# Patient Record
Sex: Female | Born: 1935 | Race: White | Hispanic: No | Smoking: Never smoker
Health system: Southern US, Community
[De-identification: ages and names within clinical notes are randomized; demographics above are authoritative.]

## PROBLEM LIST (undated history)

## (undated) DIAGNOSIS — R269 Unspecified abnormalities of gait and mobility: Secondary | ICD-10-CM

## (undated) DIAGNOSIS — I10 Essential (primary) hypertension: Secondary | ICD-10-CM

## (undated) DIAGNOSIS — G63 Polyneuropathy in diseases classified elsewhere: Secondary | ICD-10-CM

## (undated) DIAGNOSIS — G709 Myoneural disorder, unspecified: Secondary | ICD-10-CM

## (undated) DIAGNOSIS — Z8719 Personal history of other diseases of the digestive system: Secondary | ICD-10-CM

## (undated) DIAGNOSIS — G4762 Sleep related leg cramps: Secondary | ICD-10-CM

## (undated) DIAGNOSIS — D126 Benign neoplasm of colon, unspecified: Secondary | ICD-10-CM

## (undated) DIAGNOSIS — Z8619 Personal history of other infectious and parasitic diseases: Secondary | ICD-10-CM

## (undated) DIAGNOSIS — K219 Gastro-esophageal reflux disease without esophagitis: Secondary | ICD-10-CM

## (undated) DIAGNOSIS — E785 Hyperlipidemia, unspecified: Secondary | ICD-10-CM

## (undated) DIAGNOSIS — R011 Cardiac murmur, unspecified: Secondary | ICD-10-CM

## (undated) DIAGNOSIS — M549 Dorsalgia, unspecified: Secondary | ICD-10-CM

## (undated) DIAGNOSIS — F329 Major depressive disorder, single episode, unspecified: Secondary | ICD-10-CM

## (undated) DIAGNOSIS — I639 Cerebral infarction, unspecified: Secondary | ICD-10-CM

## (undated) DIAGNOSIS — I340 Nonrheumatic mitral (valve) insufficiency: Secondary | ICD-10-CM

## (undated) DIAGNOSIS — M179 Osteoarthritis of knee, unspecified: Secondary | ICD-10-CM

## (undated) DIAGNOSIS — I4891 Unspecified atrial fibrillation: Secondary | ICD-10-CM

## (undated) DIAGNOSIS — K5790 Diverticulosis of intestine, part unspecified, without perforation or abscess without bleeding: Secondary | ICD-10-CM

## (undated) DIAGNOSIS — C801 Malignant (primary) neoplasm, unspecified: Secondary | ICD-10-CM

## (undated) DIAGNOSIS — B029 Zoster without complications: Secondary | ICD-10-CM

## (undated) DIAGNOSIS — R413 Other amnesia: Secondary | ICD-10-CM

## (undated) DIAGNOSIS — M1710 Unilateral primary osteoarthritis, unspecified knee: Secondary | ICD-10-CM

## (undated) DIAGNOSIS — F32A Depression, unspecified: Secondary | ICD-10-CM

## (undated) DIAGNOSIS — G629 Polyneuropathy, unspecified: Secondary | ICD-10-CM

## (undated) DIAGNOSIS — M1990 Unspecified osteoarthritis, unspecified site: Secondary | ICD-10-CM

## (undated) HISTORY — DX: Polyneuropathy, unspecified: G62.9

## (undated) HISTORY — PX: APPENDECTOMY: SHX54

## (undated) HISTORY — DX: Polyneuropathy in diseases classified elsewhere: G63

## (undated) HISTORY — DX: Unspecified atrial fibrillation: I48.91

## (undated) HISTORY — DX: Other amnesia: R41.3

## (undated) HISTORY — DX: Osteoarthritis of knee, unspecified: M17.9

## (undated) HISTORY — PX: COLON SURGERY: SHX602

## (undated) HISTORY — DX: Zoster without complications: B02.9

## (undated) HISTORY — DX: Unilateral primary osteoarthritis, unspecified knee: M17.10

## (undated) HISTORY — DX: Cerebral infarction, unspecified: I63.9

## (undated) HISTORY — DX: Gastro-esophageal reflux disease without esophagitis: K21.9

## (undated) HISTORY — PX: TONSILLECTOMY: SUR1361

## (undated) HISTORY — DX: Personal history of other infectious and parasitic diseases: Z86.19

## (undated) HISTORY — DX: Hyperlipidemia, unspecified: E78.5

## (undated) HISTORY — DX: Depression, unspecified: F32.A

## (undated) HISTORY — DX: Benign neoplasm of colon, unspecified: D12.6

## (undated) HISTORY — PX: TUBAL LIGATION: SHX77

## (undated) HISTORY — PX: ABDOMINAL HYSTERECTOMY: SHX81

## (undated) HISTORY — PX: JOINT REPLACEMENT: SHX530

## (undated) HISTORY — PX: EYE SURGERY: SHX253

## (undated) HISTORY — DX: Nonrheumatic mitral (valve) insufficiency: I34.0

## (undated) HISTORY — PX: WRIST SURGERY: SHX841

## (undated) HISTORY — DX: Dorsalgia, unspecified: M54.9

## (undated) HISTORY — DX: Unspecified abnormalities of gait and mobility: R26.9

## (undated) HISTORY — DX: Major depressive disorder, single episode, unspecified: F32.9

## (undated) HISTORY — DX: Sleep related leg cramps: G47.62

## (undated) HISTORY — DX: Diverticulosis of intestine, part unspecified, without perforation or abscess without bleeding: K57.90

---

## 1997-08-31 ENCOUNTER — Other Ambulatory Visit: Admission: RE | Admit: 1997-08-31 | Discharge: 1997-08-31 | Payer: Self-pay | Admitting: Obstetrics and Gynecology

## 1998-09-30 ENCOUNTER — Other Ambulatory Visit: Admission: RE | Admit: 1998-09-30 | Discharge: 1998-09-30 | Payer: Self-pay | Admitting: Obstetrics and Gynecology

## 1999-05-10 ENCOUNTER — Encounter (INDEPENDENT_AMBULATORY_CARE_PROVIDER_SITE_OTHER): Payer: Self-pay | Admitting: Specialist

## 1999-05-10 ENCOUNTER — Ambulatory Visit (HOSPITAL_COMMUNITY): Admission: RE | Admit: 1999-05-10 | Discharge: 1999-05-10 | Payer: Self-pay | Admitting: Internal Medicine

## 1999-05-10 ENCOUNTER — Encounter: Payer: Self-pay | Admitting: Internal Medicine

## 1999-10-03 ENCOUNTER — Encounter (INDEPENDENT_AMBULATORY_CARE_PROVIDER_SITE_OTHER): Payer: Self-pay | Admitting: Specialist

## 1999-10-03 ENCOUNTER — Encounter: Payer: Self-pay | Admitting: Surgery

## 1999-10-03 ENCOUNTER — Inpatient Hospital Stay (HOSPITAL_COMMUNITY): Admission: EM | Admit: 1999-10-03 | Discharge: 1999-10-10 | Payer: Self-pay | Admitting: Surgery

## 1999-10-04 ENCOUNTER — Encounter: Payer: Self-pay | Admitting: Surgery

## 1999-10-09 ENCOUNTER — Encounter: Payer: Self-pay | Admitting: General Surgery

## 1999-11-06 ENCOUNTER — Encounter: Payer: Self-pay | Admitting: Surgery

## 1999-11-06 ENCOUNTER — Ambulatory Visit (HOSPITAL_COMMUNITY): Admission: RE | Admit: 1999-11-06 | Discharge: 1999-11-06 | Payer: Self-pay | Admitting: Surgery

## 1999-11-29 ENCOUNTER — Ambulatory Visit (HOSPITAL_COMMUNITY): Admission: RE | Admit: 1999-11-29 | Discharge: 1999-11-29 | Payer: Self-pay | Admitting: Surgery

## 1999-11-29 ENCOUNTER — Encounter: Payer: Self-pay | Admitting: Surgery

## 1999-12-12 ENCOUNTER — Other Ambulatory Visit: Admission: RE | Admit: 1999-12-12 | Discharge: 1999-12-12 | Payer: Self-pay | Admitting: Obstetrics and Gynecology

## 2000-08-13 ENCOUNTER — Encounter: Payer: Self-pay | Admitting: Orthopedic Surgery

## 2000-08-15 ENCOUNTER — Inpatient Hospital Stay (HOSPITAL_COMMUNITY): Admission: RE | Admit: 2000-08-15 | Discharge: 2000-08-19 | Payer: Self-pay | Admitting: Orthopedic Surgery

## 2000-08-15 ENCOUNTER — Encounter: Payer: Self-pay | Admitting: Orthopedic Surgery

## 2000-12-11 ENCOUNTER — Other Ambulatory Visit: Admission: RE | Admit: 2000-12-11 | Discharge: 2000-12-11 | Payer: Self-pay | Admitting: Obstetrics and Gynecology

## 2004-12-14 ENCOUNTER — Ambulatory Visit: Payer: Self-pay | Admitting: Internal Medicine

## 2005-01-02 ENCOUNTER — Ambulatory Visit: Payer: Self-pay | Admitting: Internal Medicine

## 2005-01-02 ENCOUNTER — Encounter (INDEPENDENT_AMBULATORY_CARE_PROVIDER_SITE_OTHER): Payer: Self-pay | Admitting: Specialist

## 2005-07-11 ENCOUNTER — Encounter: Admission: RE | Admit: 2005-07-11 | Discharge: 2005-07-11 | Payer: Self-pay | Admitting: Internal Medicine

## 2005-10-12 ENCOUNTER — Ambulatory Visit: Payer: Self-pay | Admitting: Internal Medicine

## 2007-01-28 ENCOUNTER — Encounter: Admission: RE | Admit: 2007-01-28 | Discharge: 2007-01-28 | Payer: Self-pay | Admitting: Internal Medicine

## 2007-07-09 ENCOUNTER — Emergency Department (HOSPITAL_COMMUNITY): Admission: EM | Admit: 2007-07-09 | Discharge: 2007-07-09 | Payer: Self-pay | Admitting: Emergency Medicine

## 2007-07-18 ENCOUNTER — Inpatient Hospital Stay (HOSPITAL_COMMUNITY): Admission: EM | Admit: 2007-07-18 | Discharge: 2007-07-20 | Payer: Self-pay | Admitting: Internal Medicine

## 2007-07-24 ENCOUNTER — Other Ambulatory Visit: Admission: RE | Admit: 2007-07-24 | Discharge: 2007-07-24 | Payer: Self-pay | Admitting: Gastroenterology

## 2007-12-01 ENCOUNTER — Encounter: Admission: RE | Admit: 2007-12-01 | Discharge: 2007-12-01 | Payer: Self-pay | Admitting: Internal Medicine

## 2007-12-02 ENCOUNTER — Inpatient Hospital Stay (HOSPITAL_COMMUNITY): Admission: EM | Admit: 2007-12-02 | Discharge: 2007-12-03 | Payer: Self-pay | Admitting: Emergency Medicine

## 2007-12-26 ENCOUNTER — Encounter: Admission: RE | Admit: 2007-12-26 | Discharge: 2007-12-26 | Payer: Self-pay | Admitting: Gastroenterology

## 2008-01-05 ENCOUNTER — Ambulatory Visit (HOSPITAL_COMMUNITY): Admission: RE | Admit: 2008-01-05 | Discharge: 2008-01-06 | Payer: Self-pay | Admitting: *Deleted

## 2008-01-05 ENCOUNTER — Encounter (INDEPENDENT_AMBULATORY_CARE_PROVIDER_SITE_OTHER): Payer: Self-pay | Admitting: *Deleted

## 2008-06-09 ENCOUNTER — Ambulatory Visit (HOSPITAL_COMMUNITY): Admission: RE | Admit: 2008-06-09 | Discharge: 2008-06-09 | Payer: Self-pay | Admitting: Neurology

## 2010-08-15 NOTE — H&P (Signed)
Morgan Boyer, Morgan Boyer               ACCOUNT NO.:  [id number]   MEDICAL RECORD NO.:  [id number]          PATIENT TYPE:  OBV   LOCATION:  1526                         FACILITY:  [facility]   PHYSICIAN:  Alfonse Ras, MD   DATE OF BIRTH:  March 24, 1932   DATE OF ADMISSION:  12/01/2007  DATE OF DISCHARGE:                              HISTORY & PHYSICAL   CHIEF COMPLAINT:  Lower abdominal pain x24 hours .   HISTORY OF PRESENT ILLNESS:  The patient is a very pleasant 77 year old  white female with a known history of diverticulitis in the past, in 2001  who underwent percutaneous drainage at that time but did not undergo any  surgical intervention.  The patient presents now with about of a 36-hour  history of worsening lower abdominal pain localizing in to the right  lower quadrant.  She denies fever or chills but does have some nausea,  and no vomiting.  She did eat lunch today and is hungry tonight.  He CT  scan shows enlarged appendix but minimal periappendiceal stranding, but  no contrast was made to the terminal ileum or to the right colon.  It is  difficult to assess the appendix on the CT scan.  I reviewed this with  Dr. Margo Aye over the phone and on eyesight.  The patient has received no  pain medication here in the emergency room.  She has had two other  episodes of this in the past which resolved spontaneously.  She actually  feels much better now than she did last evening.   PAST MEDICAL HISTORY:  Significant for DJD, diverticulitis,  gastroesophageal reflux disease, and hypertension.   MEDICATIONS:  Lisinopril 20/25 once a day and ibuprofen as needed.   REVIEW OF SYSTEMS:  Significant as above.   PHYSICAL EXAMINATION:  GENERAL:  On physical exam, she is an age-  appropriate white female in no distress.  VITAL SIGNS:  Blood pressure is 164/89, her heart rate is 78,  respiratory rate is 18, and temperature is 97 degrees.  HEENT:  Benign.  Normocephalic and atraumatic.  Pupils are  equal, round,  and reactive to light.  NECK:  Supple and soft without thyromegaly or cervical adenopathy.  LUNGS:  Clear to auscultation and percussion x2.  HEART:  Regular rate and rhythm without murmurs, rubs or gallops.  ABDOMEN:  Soft, somewhat tender in the right lower quadrant and  extending up the right mid-abdomen, but with only deep palpation.  She  has normal active bowel sounds.  EXTREMITIES:  No clubbing or cyanosis.  There is 1+ pitting edema of the  lower extremities.   LABORATORY DATA:  Her white count is 7400 without a left shift.  Her  chemistries are completely normal.  Her CT scan is reviewed as above.   PLAN:  Plan is to re-CT the pelvis and just right lower quadrant to  reevaluate the appendix.  Since the patient is clinically improving, I  will put her on some IV antibiotics, but we will see what the CT scan  shows.  I discussed this at length with her, her  husband, her daughter,  and her granddaughter; depending on the CT results, she may end up with  a diagnostic laparoscopy at some point, but we will see what her scan  shows and a repeat white count shows.      Alfonse Ras, MD  Electronically Signed     KRE/MEDQ  D:  12/01/2007  T:  12/02/2007  Job:  [id number]   cc:   Thora Lance, M.D.  Fax: [phone]

## 2010-08-15 NOTE — H&P (Signed)
NAMESAI, MOURA               ACCOUNT NO.:  [id number]   MEDICAL RECORD NO.:  [id number]          PATIENT TYPE:  INP   LOCATION:  NA                           FACILITY:  [facility]   PHYSICIAN:  Thora Lance, M.D.  DATE OF BIRTH:  [birth year]   DATE OF ADMISSION:  DATE OF DISCHARGE:                              HISTORY & PHYSICAL   CHIEF COMPLAINT:  Weakness.   HISTORY OF PRESENT ILLNESS:  This is a 77 year old white female who on  April 3 developed soreness in her throat.  She was seen in our office  April 6 and diagnosed pharyngitis, question herpetic versus zoster.  In  the next two day, her symptoms of soreness in the throat progressed  significantly and she went to [facility] Room where she was  diagnosed with herpes zoster infection with multiple vesicles in her  mouth and throat.  She was started on acyclovir 800 mg five times a day  and prednisone which she completed a course of yesterday.  Since being  on these medications, she has had nausea and pain in her mouth and  throat.  She has been able to eat very little and has lost 9 pounds.  Her fluid intake has also been restricted.  She has become very weak.  She can only walk on her own strength for a couple steps.  She is  accompanied by her boyfriend today.  She is in a wheelchair.  She is  feeling dizzy when she stands up.   PAST MEDICAL HISTORY:  1. Peripheral neuropathy, chronically on gabapentin.  2. Hypertension.  3. GERD.  4. Rheumatoid arthritis.  5. Degenerative joint disease with right total knee replacement.  6. Possible peptic stricture and possible Barrett's esophagitis.  7. Diverticulosis.  8. Hyperlipidemia.   PAST SURGICAL HISTORY:  1. TAH/BSO April 1995, Dr. Ashley Royalty.  2. Total knee replacement, May 2002, Dr. Eulah Pont.  3. Tubal ligation.  4. Tonsillectomy.  5. History of drainage procedure for diverticulitis and abscess.   ALLERGIES:  SULFA DRUGS.  LOTENSIN may have caused hair  loss.   CURRENT MEDICATIONS:  1. Simvastatin 40 mg one-half q.p.m. started two weeks ago.  2. __________ 20/25 mg a day.  3. Gabapentin 600 mg at bedtime.  4. Amitriptyline 25 mg at bedtime.  5. Aspirin 81 mg a day.  6. Calcium once a day.  7. Vitamin D once a day.  8. Prilosec 20 mg a day.  9. Stool softener two a day.  10.Garlic once a day.  11.Fish oil once a day.  12.Citrucel.  13.Flax seed oil.  14.Sinemet.   FAMILY HISTORY:  Father died at age 41 with several strokes.  Mother age  66-1/2.  Brother MI age 13.  Two brothers heart disease.  One brother  and sister in good health.   SOCIAL HISTORY:  Married.  Three children.  Occupation:  Retired.  Smoking.  No alcohol now.   PHYSICAL EXAMINATION:  GENERAL:  Weak, ill-appearing white female.  VITAL SIGNS:  Blood pressure 12274, heart rate 80, temperature 98.1,  weight 152.  HEENT:  Oropharynx shows dry mucous membranes. There are resolving  vesicles in her left palate and oropharynx.  There is no erythema,  swelling or drainage.  NECK:  Supple.  No lymphadenopathy.  LUNGS:  Clear.  HEART:  Regular rate and rhythm.  No murmur, gallop or rub.  ABDOMEN:  Obese, soft, nontender.  No mass or hepatosplenomegaly.  EXTREMITIES:  No edema.  NEUROLOGIC:  Nonfocal.   LABORATORY DATA:  CPK low at 33, glucose 112, BUN 14, creatinine 0.7,  sodium 132, potassium 3.2, chloride 98, bicarb 25.  Liver function tests  completely normal.  CBC showed white count 15.6 (on steroids), platelets  325,000, hemoglobin 15.2.   ASSESSMENT/PLAN:  1. Recent oral herpes zoster, improving.  2. Fatigue/weakness secondary to #1 and also side effect of      medications.  3. Mild dehydration.  4. Other medical problems listed above, stable.   PLAN:  Admit for IV fluids and supportive care.  Expect her admission to  be 24 to 48 hours.           ______________________________  Thora Lance, M.D.     JJG/MEDQ  D:  07/18/2007  T:  07/18/2007   Job:  [id number]

## 2010-08-15 NOTE — Op Note (Signed)
NAME:  Morgan Boyer, Morgan Boyer               ACCOUNT NO.:  [id number]   MEDICAL RECORD NO.:  [id number]          PATIENT TYPE:  OIB   LOCATION:  1537                         FACILITY:  [facility]   PHYSICIAN:  Alfonse Ras, MD   DATE OF BIRTH:  [birth year]   DATE OF PROCEDURE:  DATE OF DISCHARGE:                               OPERATIVE REPORT   PREOPERATIVE DIAGNOSIS:  Persistent right sided abdominal pain, probable  chronic appendicitis.   POSTOPERATIVE DIAGNOSIS:  Normal appearing appendix and some adhesions.   PROCEDURES:  Diagnostic laparoscopy, lysis of adhesions, and  appendectomy.   SURGEON:  Alfonse Ras, MD   ANESTHESIA:  General.   DESCRIPTION:  The patient was taken to the operating room, placed in a  supine position.  After adequate general anesthesia was induced using  endotracheal tube, the abdomen was prepped and draped in normal sterile  fashion.  Foley catheter had been previously placed.  Using a 12-mm  Optivu in the left upper quadrant, peritoneal access was obtained under  direct vision.  Pneumoperitoneum was obtained.  Additional 12-mm trocar  was placed in the left lower quadrant and 5-mm trocar was placed in the  left mid abdomen.  There were few filmy adhesions which were easily  taken down with sharp dissection.  The right colon was inspected  appeared normal.  No diverticula were noted.  The sigmoid colon appeared  very redundant.  No other pathology was noted.  The appendix was  completely normal appearing, however, the mesoappendix was quite thick  and had some questionable inflammation.  Mesoappendix was taken down  with the harmonic scalpel and the base of the appendix was transected  using a GIA 45-mm white load stapling device.  It was placed in  EndoCatch bag and removed through the left lower quadrant incision.  Adequate hemostasis was ensured.  Skin incisions were closed with  subcuticular 4-0 Monocryl and injected with 0.5 Marcaine.  Steri-Strips  and  sterile dressings were applied.  The patient tolerated the  procedure well and went to PACU in good condition.      Alfonse Ras, MD  Electronically Signed     KRE/MEDQ  D:  01/05/2008  T:  01/05/2008  Job:  [id number]   cc:   Danise Edge, M.D.  Fax: [phone]

## 2010-08-15 NOTE — Discharge Summary (Signed)
[name], Morgan Boyer               ACCOUNT NO.:  [id number]   MEDICAL RECORD NO.:  [id number]          PATIENT TYPE:  INP   LOCATION:  1526                         FACILITY:  [facility]   PHYSICIAN:  Alfonse Ras, MD   DATE OF BIRTH:  Jul 02, 1931   DATE OF ADMISSION:  12/01/2007  DATE OF DISCHARGE:  12/03/2007                               DISCHARGE SUMMARY   ADMISSION DIAGNOSIS:  Abdominal pain, rule out appendicitis.   DISCHARGE DIAGNOSIS:  Abdominal pain, enlarged appendix, and probable  colitis.   DISPOSITION:  Discharged to home.   CONDITION ON DISCHARGE:  Good and improved.   FOLLOWUP:  Follow up with me for diagnostic laparoscopy and possible  appendectomy in 2 weeks.   HISTORY OF PRESENT ILLNESS:  The patient is a very pleasant 77 year old  female who presented at the request of Dr. Kirby Funk to the [facility] Emergency Room for CT scan to rule out appendicitis after a 2-day  history of abdominal pain.  This showed enlarged appendix, but without  significant inflammation around the appendix.  It was retrocecal and  heading towards the right lobe of the liver.  The patient was feeling  better at the time I saw her in the emergency room and over the next few  days in the hospital on IV Cipro.  Her white count continued to decline  down to 5.1, however, hemoglobin did decrease from 13.9 to 10.9 with  hydration.  She denied any bloody bowel movements here, but has been  noticing some blood on the toilet tissue at home over the last 2 to 3  weeks.  Colonoscopy has apparently been scheduled by Dr. Danise Edge  within the next 6 months.   HOSPITAL COURSE:  The patient was admitted, started on Cipro.  Her pain  continued to improve.  She had three bowel movements, none of them were  bloody.  Here in the hospital she felt much better, was taking p.o.  She  had some mild nausea but was anxious to go home.  She was discharged  home with scheduling of elective laparoscopy  and probable laparoscopic  appendectomy in the future and I will confer with Dr. Danise Edge  about colonoscopy.      Alfonse Ras, MD  Electronically Signed     KRE/MEDQ  D:  12/03/2007  T:  12/03/2007  Job:  [id number]   cc:   Thora Lance, M.D.  Fax: [phone]   Danise Edge, M.D.  Fax: [phone]

## 2010-08-18 NOTE — Discharge Summary (Signed)
[place]  Patient:    RAJAH, LAMBA                      MRN: [id number] Adm. Date:  [id number] Disc. Date: [id number] Attending:  Colbert Ewing Dictator:   Oris Drone. Petrarca, P.A.-C.                           Discharge Summary  ADMITTING DIAGNOSIS:  Advanced degenerative joint disease of the right knee with rheumatoid arthritis.  DISCHARGE DIAGNOSES: 1. Advanced degenerative joint disease of the right knee with rheumatoid    arthritis. 2. Synovitis. 3. Esophageal reflux. 4. Diverticulitis.  PROCEDURES:  Right total knee replacement.  HISTORY OF PRESENT ILLNESS:  [age]-year-old white female with rheumatoid arthritis and a seven-year history of right knee problems.  She has worsened over the past years, with radiographic end-stage degenerative changes.  Now indicated for right total knee replacement.  HOSPITAL COURSE:  [age]-year-old white female admitted Aug 15, 2000. After appropriate laboratory studies were obtained as well as 1 g of Ancef IV on-call to the operating room, was taken to the operating room, where she underwent a right total knee replacement.  She tolerated the procedure well. She was continued postoperatively on Ancef 1 g IV q.8h. for three doses. Begun on heparin 5000 units subcutaneous q.12h. until her Coumadin as per protocol with the pharmacy became effective.  She will be placed on 30-day protocol.  Her remaining home medications were renewed.  Consultations with PT, OT, and rehabilitation were ordered.  Physical exam allowed for ambulation, weightbearing as tolerated on the right knee.  CPM was placed 0-80 degrees, incremented by 10 degrees a day to 110 degrees.  An epidural was placed postoperatively as well as a intraoperative Foley also placed.  She was allowed out of bed to a chair the following day.  She had some difficulties with hypokalemia, and this was corrected with oral supplementation of  K-Dur 20 mEq daily.  The remainder of her hospital course was uneventful, and she was discharged on Aug 19, 2000, to return back to our office in 10 days for staple removal.  LABORATORY DATA:  EKG was noted to be normal sinus rhythm with left anterior fascicular hemiblock.  Chest x-ray showed atelectasis at the right base with bilateral small effusions.  Right knee revealed well-seated component to the total knee prosthesis.  On Aug 13, 2000, reveals a hemoglobin 13.0, hematocrit 37.6%, white count 7200, platelets 286,000.  Discharge hemoglobin 9.6, hematocrit 27.3%, white count 10,100, platelets 204,000.  Chemistries of Aug 13, 2000, reveal sodium 135, potassium 3.9, chloride 101, CO2 27, glucose 133, BUN 12, creatinine 0.7, calcium 9.0, total protein 6.8, albumin 3.3, AST 29, ALT 18, ALP 44, total bilirubin 0.7.  Discharge sodium 137, potassium 3.4, chloride 100, CO2 32, glucose was 101, BUN 7, creatinine 0.7, calcium 8.2.  Urinalysis showed 0-3 white, 0-3 red, trace leukocyte esterase.  No bacteria was seen.  Blood type was A positive, antibody screen negative.  DISCHARGE MEDICATIONS: 1. Iron tablets 325 mg 1 daily with a meal. 2. Coumadin 5 mg 1-1/2 tablets in the evening. 3. Percocet 1 p.o. q.4h. p.r.n. pain. 4. OxyContin CR 20 mg 1 q.12h. for pain.  DISCHARGE INSTRUCTIONS:  She will be allowed weightbearing as tolerated with a walker or crutches.  Routine diet, except for no leafy vegetables, but will also need two bananas a day.  Keep  the dressing clean and dry.  Change as needed.  Call if she develops any problems with swelling, bleeding, drainage, or foul smell from the wound.  Also if she develops any temperatures.  FOLLOW-UP:  She is going to follow back up with the office in 10 days for staple removal.  She will follow up with her family doctor with her potassium.  CONDITION ON DISCHARGE:  Improved. DD:  09/19/00 TD:  09/20/00 Job: [id number]

## 2010-08-18 NOTE — Op Note (Signed)
[place]. [facility]  Patient:    Morgan Boyer, Morgan Boyer                        MRN: [id number] Proc. Date: 08/15/00 Attending:  Burna Forts, M.D. CC:         Anesthesia Department                           Operative Report  PREOPERATIVE DIAGNOSIS:  Degenerative joint disease of the knee.  OPERATION:  Total knee replacement performed by Dr. Avelina Laine.  ANESTHESIOLOGIST:  Burna Forts, M.D.  ANESTHESIA PROCEDURE:  Placement of epidural catheter or postoperative analgesia.  DESCRIPTION OF PROCEDURE:  Preoperatively, the risks and benefits of placement of the epidural catheter for postoperative analgesia were discussed in detail with the patient including alternatives for pain control.  The patient consented for placement of the epidural catheter for postoperative analgesia and, as well, Dr. Eulah Pont had requested this as his technique of choice for postoperative analgesia.  The patient was turned to the left lateral decubitus position, and a sterile prep of the lumbar was conducted.  Using a 17-gauge Tuohy needle adjacent to the L2-3 interspace, the epidural space was contacted with a loss of resistance technique and the catheter threaded approximately 3 to 4 cm beyond the needle tip, and the needle was removed.  After negative aspiration for both heme and CSF, the catheter was initially cleared with 1 cc of normal saline followed by incremental dosing of a total of 7 cc of 0.25% Marcaine containing 100 mcg of fentanyl.  The patient was turned supine and transferred to PACU in stable condition.  y DD:  08/15/00 TD:  08/15/00 Job: [id number]

## 2010-08-18 NOTE — H&P (Signed)
[facility]  Patient:    Morgan Boyer, Morgan Boyer                      MRN: [id number] Adm. Date:  [id number] Attending:  Katha Cabal                         History and Physical  CHIEF COMPLAINT:  Diverticulitis, and left lower quadrant abdominal pain.  HISTORY OF PRESENT ILLNESS:  Morgan Boyer is a 77 year old lady who was initially  admitted to [facility] in [place], [place] on September 27, 1999, by  Dr. Lindwood Qua.  At that time she reported that she had eaten out at [facility] in [place], and the next morning developed crampy abdominal pain nd diarrhea.  She then began having pain in the left lower quadrant, and was found to have an elevated white count, and was admitted with the diagnosis of diverticulitis.  Discussions were initiated by her son, who wanted to try to get her transferred up here over the weekend, prior to the fourth of July, and then they called on Monday, October 02, 1999, desiring transfer.  Arrangements were made for a transfer on October 03, 1999.  PAST MEDICAL HISTORY: 1. Longstanding hypertension. 2. Hypercholesterolemia. 3. She has a remote history of rheumatoid arthritis, treated with nonsteroidal    anti-inflammatory drugs. 4. History of some GERD. 5. At times she has been noted to have a borderline B12 deficiency. 6. She had an EGD this past February which showed she had a peptic stricture    with questionable short segments of Barretts esophagus. 7. She is status post hysterectomy and bilateral salpingo-oophorectomy.  CURRENT MEDICATIONS: 1. Premarin 0.625 mg p.o. q.d. 2. Prilosec 20 mg p.o. q.d. 3. Amitriptyline 10 mg one to two q.h.s. for leg pain. 4. She has recently been on fluoroquinolones intravenously. 5. She has recently been on Flagyl intravenously.  She was transferred up    with these last two medicines in place.  She was on Lotensin, but this stopped.  SOCIAL HISTORY:  She  denies smoking, or drinking alcohol.  FAMILY HISTORY:  Mother died of congestive heart failure.  Her father died of a  CVA.  She had a younger brother who died of a myocardial infarction.  REVIEW OF SYSTEMS:  Negative for neurologic problems.  No chest pain or cough. No shortness of breath.  She denied any rectal bleeding, hematemesis or melena. She has no dysuria or hematuria.  She does wear reading glasses.  PHYSICAL EXAMINATION:  GENERAL:  A well-developed, well-nourished white female.  HEENT:  Unremarkable.  No scleral icterus.  NECK:  Without bruits.  VITAL SIGNS:  Temperature 97.8 degrees, pulse 88, respirations 18, blood pressure 145/90.  The patient is about 5 feet 2 inches tall, and weighs 155 pounds.  CHEST:  Clear to auscultation.  HEART:  Sinus rhythm, without murmurs or gallops.  ABDOMEN:  Protuberant with tenderness in the left lower quadrant.  It was marked more down near the pubic bone.  EXTREMITIES:  Full range of motion.  There is a history of numbness and paresthesias in the lower extremity.  IMPRESSION:  Diverticulitis, possibly with abscess formation.  PLAN:  Admit and change antibiotics to Cipro and Flagyl IV.  Repeat CT scan with thoughts of possibly draining an abscess if one is present. DD:  10/05/99 TD:  10/05/99 Job: [id number]

## 2010-08-18 NOTE — Assessment & Plan Note (Signed)
[facility]                           GASTROENTEROLOGY OFFICE NOTE   NAME:Morgan Boyer, Morgan Boyer                      MRN:          [id number]  DATE:10/12/2005                            DOB:          [birth year]    REASON FOR CONSULTATION:  Rectal pain.   HISTORY:  This is a 77 year old female with a history of gastroesophageal  reflux disease complicated by peptic stricture for which she has undergone  prior upper endoscopy with esophageal dilation.  She was last evaluated in  the office December 14, 2004, for rectal bleeding.  On January 02, 2005, she  underwent complete colonoscopy.  This revealed internal hemorrhoids, sigmoid  diverticulosis, and an inverted appendiceal orifice which was biopsied for  confirmation.  She presents now with a 3-week history of intermittent rectal  pain with defecation as well as some associated red blood per rectum when  wiping.  She has had some mild dull lower abdominal discomfort, though she  states this was really not significant.  She has had none of this in recent  weeks.  She has tried some Preparation-H without improvement.   CURRENT MEDICATIONS:  Gabapentin, amitriptyline, aspirin, calcium, vitamin  B, Prilosec, stool softeners, garlic, fish oil, Citrucel, __________ ,  ibuprofen, and Valerian root.   ALLERGIES:  SEPTRA.   PHYSICAL EXAMINATION:  GENERAL:  Finds a well-appearing female in no acute  distress.  VITAL SIGNS:  Blood pressure 122/78, heart rate 78, weight is 160.8 pounds.  ABDOMEN:  Soft without tenderness, mass, or hernia.  RECTAL:  Reveals posterior fissure which is tender.  No other abnormalities.  Stool is hemoccult negative.   IMPRESSION:  Symptomatic anal fissure.   RECOMMENDATIONS:  1.  Sitz bath b.i.d.  2.  Anamantle cream b.i.d.  3.  Continue fiber supplementation.  4.  Literature provided on anal fissure.  5.  Follow up p.r.n.                                   Wilhemina Bonito. Eda Keys.,  MD   JNP/MedQ  DD:  10/12/2005  DT:  10/12/2005  Job #:  [id number]   cc:   Thora Lance, MD

## 2010-08-18 NOTE — Discharge Summary (Signed)
NAMEZANOVIA, ROTZ               ACCOUNT NO.:  [id number]   MEDICAL RECORD NO.:  [id number]          PATIENT TYPE:  SPE   LOCATION:  [facility]                         FACILITY:  [facility]   PHYSICIAN:  Thora Lance, M.D.  DATE OF BIRTH:  01/10/1932   DATE OF ADMISSION:  07/18/2007  DATE OF DISCHARGE:  07/20/2007                               DISCHARGE SUMMARY   REASON FOR ADMISSION:  A 77 year old white female who had been recently  diagnosed with herpes zoster infection in her mouth and throat.  She  completed a course of therapy.  She developed nausea and pain and was  unable to eat for a little period.  She lost 9 pounds.  She was admitted  for IV fluids and supportive care.   SIGNIFICANT FINDINGS:  VITAL SIGNS: Blood pressure 122/74, heart rate  80, temperature 98.1, and weight 152.  HEENT: Oropharynx, dry mucus membrane.  There were resolving vesicles in  the left palate and oropharynx.  LUNGS: Clear.  HEART: Regular rate and rhythm without murmur, gallop, or rub.  ABDOMEN: Soft, nontender.  No mass or hepatosplenomegaly.   LABORATORY WORK:  CK is 33, glucose 112, BUN 40, creatinine 0.7, sodium  132, potassium 3.2, chloride 98, and bicarb 25.  Liver function test  normal.  WBC is 15.2, platelets 225, and hemoglobin 15.2.   HOSPITAL COURSE:  The patient was admitted and given clear liquids and  IV fluids.  By the second hospital day, she was feeling much better.  She was eating without difficulty.  Her lab work was stable and her  potassium had been included.  She was discharged in good condition.   DISCHARGE DIAGNOSES:  1. Dehydration.  2. Herpes zoster.  3. Peripheral neuropathy.  4. Hypertension.  5. Gastroesophageal reflux disease.  6. Rheumatoid arthritis.  7. Degenerative joint disease.  8. Diverticulosis.  9. Hyperlipidemia.   PROCEDURES:  None.   DISCHARGE MEDICATIONS:  1. Simvastatin 40 mg one half q.p.m.  2. Lisinopril/HCT 26-25 mg daily.  3. Gabapentin 600  mg nightly.  4. Amitriptyline 25 mg nightly.  5. Aspirin 81 mg a day.  6. Calcium once a day.  7. Vitamin D once a day.  8. Prozac 20 mg a day.   DISPOSITION:  Discharge to home.   FOLLOWUP:  In 1-2 weeks with Dr. Valentina Lucks.   ACTIVITY:  As tolerated.           ______________________________  Thora Lance, M.D.     JJG/MEDQ  D:  09/03/2007  T:  09/03/2007  Job:  [id number]

## 2010-08-18 NOTE — Discharge Summary (Signed)
[facility]  Patient:    Morgan Boyer, Morgan Boyer                      MRN: [id number] Adm. Date:  [id number] Disc. Date: 10/10/99 Attending:  Katha Cabal CC:         Murvin Natal, M.D., c/o [facility], [place], [place]                           Discharge Summary  PROCEDURES: 1. Percutaneous drainage of collection in pelvis, consistent with collection    around diverticulitis. 2. Followup CT scan on October 09, 1999 showed no residual pelvic abscess and the    drainage catheter was pulled.  CT scan reports have demonstrated initially    a large pocket of fluid, about 8 cm in diameter, in the pelvis, which was    drained.  There were diffuse inflammatory changes of the lower abdomen with    bowel wall thickening in the sigmoid, consistent with a diverticulitis.  At    one point at the time of drainage, there was a question of an upper pelvic    dermoid cyst raised by Dr. Maple Hudson.  The peritoneal fluid showed abundant    inflammation but no malignant cells were noted.  There was a questionable    thickening in the right lower rectus muscle.   COURSE IN THE HOSPITAL:  Ms. Nodal was transferred down by [facility] and admitted with diagnoses of left lower quadrant pain and diverticulitis by previous CT scan.  She was placed on Cipro and Flagyl intravenously and her CT scans were reviewed and felt to be consistent with a diverticular abscess. She underwent a percutaneous drainage on October 04, 1999 and felt immediately better.  Her white count, which had been elevated, responded to drainage and mainly, she had a very prominent clinical response to drainage.  This drain was kept in for several days while she was kept on Cipro and Flagyl.  It did not grow anything out but that is not unusual, since she had been on a several-day course of antibiotics.  Repeat CT scan was obtained on October 09, 1999 which showed no residual fluid abscess and the catheter was pulled.   She was doing well on October 10, 1999 and was sent home.  FOLLOWUP:  She was asked to return to my office in two to three weeks at which time a barium enema will be ordered to study the colon.  DISCHARGE MEDICATIONS:  She will be kept on Cipro and Flagyl for at least seven days.  DIET:  She was instructed on a low residue diet.  FINAL DIAGNOSIS:  Probable diverticulitis of the sigmoid colon with fluid collection (sterile abscess).  PLAN:  Followup evaluation to rule out need for surgery, either to resect colon or to evaluate further question of any kind of inflammatory mass in her right rectus muscle.  Return in three weeks.  CONDITION:  Improved. DD:  10/10/99 TD:  10/10/99 Job: [id number]

## 2010-08-18 NOTE — Op Note (Signed)
[place]  Patient:    Morgan Boyer, Morgan Boyer                      MRN: [id number] Proc. Date: 08/15/00 Adm. Date:  [id number] Attending:  Colbert Ewing                           Operative Report  PREOPERATIVE DIAGNOSIS:  Rheumatoid arthritis with end-stage changes, right knee.  POSTOPERATIVE DIAGNOSIS:  Rheumatoid arthritis with end-stage changes, right knee.  Varus alignment and flexion contracture as well as residual synovitis and loose bodies.  PROCEDURE:  Right total knee replacement, Osteonics prosthesis.  Synovectomy. Pressfit #7 posterior stabilizing femoral component.  Cemented #7 tibial component with 12 mm polyethylene insert.  Cemented nonmetal backed recessed 26 mm patellar component.  Appropriate soft tissue bouncing.  SURGEON:  Loreta Ave, M.D.  ASSISTANT:  Arlys John D. Petrarca, P.A.-C.  ANESTHESIA:  General anesthesia.  ESTIMATED BLOOD LOSS:  Minimal.  TOURNIQUET TIME:  One hour.  SPECIMENS:  Bone and soft tissues.  CULTURES:  None.  COMPLICATIONS:  None.  DRESSING:  Soft, compressive.  DRAINS:  Hemovac x 2.  DESCRIPTION OF PROCEDURE:  The patient was brought to the operating room and placed on the operating table in the supine position.  After adequate anesthesia had been obtained, the right knee examined.  Varus alignment correctable to about neutral.  5 degree flexion contracture.  Further flexion to 100 degrees.  Tourniquet applied.  Prepped and draped in the usual sterile fashion.  Exsanguinated with elevation of Esmarch.  Tourniquet inflated to 350 mmHg.  Straight incision above the patella down to the tibial tubercle.  Skin and subcutaneous tissue divided.  Hemostasis was obtained with electrocautery. Medial parapatellar arthrotomy.  Knee exposed.  Grade IV changes especially medially.  Numerous rice bodies and loose bodies removed.  Residual remaining synovitis resected from all compartments.   Remnants of cruciate ligaments and menisci removed.  Contracture PCL which was therefore resected.  Distal femur exposed.  Intermedullary guide placed.  Distal cut removing 10 mm set of 5 degree of valgus.  Sized for a #7 component.  Posterior stabilizing type. Jigs put in place, definitive cuts made.  Trial put in place and found to fit well.  Trial removed.  Tibia exposed.  Tibial spine was removed with a saw. The intermedullary guide placed.  Proximal cut 5 degree posterior slope cut removing 6 mm deficient medial side.  The patella was then sized, reamed, and drilled for 26 mm patella.  All trials put in place.  With the 12 mm insert, full extension, full flexion, no component lift off with flexion, nicely balanced knees set at 5 degrees of valgus.  Tibia was marked for appropriate rotation and then punches were used for the cruciate component of the tibial portion.  All trials were removed.  The knee was copiously irrigated.  All recess examined, all loose bodies removed.  Loose bodies were also milked out of the Bakers cyst to remove these as well.  Pulse lavaged.  Cement prepared. Placed on tibial component which was hammered in place.  Polyethylene attached.  Femoral component hammered into place.  Patellar component had cement applied and then compressed in place.  Excessive cement removed.  The knee examined.  Full motion with excellent stability, good alignment, full flexion, full extension, and good patellofemoral tracking.  The knee was held until cement hardened.  Reexamined  with the same findings.  Wound irrigated. Hemovac was placed and brought out through separate stab wounds.  Arthrotomy closed with #1 Vicryl.  Skin and subcutaneous tissue with Vicryl and staples. Margins of the wound and knee injected with Marcaine and Hemovac was clamped. A sterile compressive dressing applied.  Tourniquet deflated and removed. Knee immobilizer applied.  Anesthesia reversed.  Brought to  the recovery room. Tolerated the surgery well with no complications. DD:  08/15/00 TD:  08/15/00 Job: [id number]

## 2010-12-26 LAB — BASIC METABOLIC PANEL
BUN: 9
CO2: 26
Chloride: 99
GFR calc non Af Amer: >60
Glucose, Bld: 90
Potassium: 3.6
Sodium: 133 — ABNORMAL LOW

## 2010-12-26 LAB — URINALYSIS, ROUTINE W REFLEX MICROSCOPIC
Bilirubin Urine: NEGATIVE
Hgb urine dipstick: NEGATIVE
Ketones, ur: NEGATIVE
Nitrite: NEGATIVE
Protein, ur: NEGATIVE
Specific Gravity, Urine: 1.019
Urobilinogen, UA: 0.2

## 2011-01-02 LAB — CBC
HCT: 37.3
Hemoglobin: 12.5
MCV: 88
Platelets: 220
RDW: 12.8

## 2011-01-02 LAB — DIFFERENTIAL
Basophils Absolute: 0
Basophils Relative: 0
Lymphocytes Relative: 41
Neutro Abs: 2.9
Neutrophils Relative %: 43

## 2011-01-02 LAB — COMPREHENSIVE METABOLIC PANEL
Albumin: 3.5
Alkaline Phosphatase: 41
BUN: 7
Chloride: 104
Creatinine, Ser: 0.64
Glucose, Bld: 102 — ABNORMAL HIGH
Potassium: 3.7
Total Bilirubin: 0.8
Total Protein: 6.3

## 2011-01-03 LAB — CBC
HCT: 31.9 — ABNORMAL LOW
HCT: 33.4 — ABNORMAL LOW
Hemoglobin: 10.9 — ABNORMAL LOW
Hemoglobin: 11.5 — ABNORMAL LOW
MCHC: 34.2
MCV: 88.4
RBC: 3.61 — ABNORMAL LOW
RBC: 3.8 — ABNORMAL LOW
RDW: 12.2

## 2011-04-30 DIAGNOSIS — G609 Hereditary and idiopathic neuropathy, unspecified: Secondary | ICD-10-CM | POA: Diagnosis not present

## 2011-04-30 DIAGNOSIS — I10 Essential (primary) hypertension: Secondary | ICD-10-CM | POA: Diagnosis not present

## 2011-04-30 DIAGNOSIS — Z1331 Encounter for screening for depression: Secondary | ICD-10-CM | POA: Diagnosis not present

## 2011-05-30 DIAGNOSIS — M9981 Other biomechanical lesions of cervical region: Secondary | ICD-10-CM | POA: Diagnosis not present

## 2011-05-30 DIAGNOSIS — M999 Biomechanical lesion, unspecified: Secondary | ICD-10-CM | POA: Diagnosis not present

## 2011-06-07 DIAGNOSIS — M9981 Other biomechanical lesions of cervical region: Secondary | ICD-10-CM | POA: Diagnosis not present

## 2011-06-07 DIAGNOSIS — M999 Biomechanical lesion, unspecified: Secondary | ICD-10-CM | POA: Diagnosis not present

## 2011-06-14 DIAGNOSIS — M999 Biomechanical lesion, unspecified: Secondary | ICD-10-CM | POA: Diagnosis not present

## 2011-06-14 DIAGNOSIS — M9981 Other biomechanical lesions of cervical region: Secondary | ICD-10-CM | POA: Diagnosis not present

## 2011-06-20 DIAGNOSIS — M999 Biomechanical lesion, unspecified: Secondary | ICD-10-CM | POA: Diagnosis not present

## 2011-06-20 DIAGNOSIS — M9981 Other biomechanical lesions of cervical region: Secondary | ICD-10-CM | POA: Diagnosis not present

## 2011-06-28 DIAGNOSIS — M999 Biomechanical lesion, unspecified: Secondary | ICD-10-CM | POA: Diagnosis not present

## 2011-06-28 DIAGNOSIS — M9981 Other biomechanical lesions of cervical region: Secondary | ICD-10-CM | POA: Diagnosis not present

## 2011-07-10 DIAGNOSIS — M999 Biomechanical lesion, unspecified: Secondary | ICD-10-CM | POA: Diagnosis not present

## 2011-07-10 DIAGNOSIS — M9981 Other biomechanical lesions of cervical region: Secondary | ICD-10-CM | POA: Diagnosis not present

## 2011-09-04 DIAGNOSIS — M1710 Unilateral primary osteoarthritis, unspecified knee: Secondary | ICD-10-CM | POA: Diagnosis not present

## 2011-10-16 DIAGNOSIS — M999 Biomechanical lesion, unspecified: Secondary | ICD-10-CM | POA: Diagnosis not present

## 2011-10-16 DIAGNOSIS — M9981 Other biomechanical lesions of cervical region: Secondary | ICD-10-CM | POA: Diagnosis not present

## 2011-10-26 DIAGNOSIS — M1710 Unilateral primary osteoarthritis, unspecified knee: Secondary | ICD-10-CM | POA: Diagnosis not present

## 2011-10-29 DIAGNOSIS — I10 Essential (primary) hypertension: Secondary | ICD-10-CM | POA: Diagnosis not present

## 2011-10-29 DIAGNOSIS — E785 Hyperlipidemia, unspecified: Secondary | ICD-10-CM | POA: Diagnosis not present

## 2011-10-31 ENCOUNTER — Encounter (HOSPITAL_COMMUNITY)
Admission: RE | Admit: 2011-10-31 | Discharge: 2011-10-31 | Disposition: A | Discharge status: Home / Self Care | Payer: Medicare Other | Source: Ambulatory Visit | Attending: Orthopedic Surgery | Admitting: Orthopedic Surgery

## 2011-10-31 ENCOUNTER — Ambulatory Visit (HOSPITAL_COMMUNITY)
Admission: RE | Admit: 2011-10-31 | Discharge: 2011-10-31 | Disposition: A | Discharge status: Home / Self Care | Payer: Medicare Other | Source: Ambulatory Visit | Attending: Surgery | Admitting: Surgery

## 2011-10-31 ENCOUNTER — Encounter (HOSPITAL_COMMUNITY): Payer: Self-pay | Admitting: Pharmacy Technician

## 2011-10-31 ENCOUNTER — Encounter (HOSPITAL_COMMUNITY): Payer: Self-pay

## 2011-10-31 DIAGNOSIS — Z01818 Encounter for other preprocedural examination: Secondary | ICD-10-CM | POA: Diagnosis not present

## 2011-10-31 DIAGNOSIS — Z01812 Encounter for preprocedural laboratory examination: Secondary | ICD-10-CM | POA: Diagnosis not present

## 2011-10-31 DIAGNOSIS — J449 Chronic obstructive pulmonary disease, unspecified: Secondary | ICD-10-CM | POA: Diagnosis not present

## 2011-10-31 DIAGNOSIS — J4489 Other specified chronic obstructive pulmonary disease: Secondary | ICD-10-CM | POA: Diagnosis not present

## 2011-10-31 HISTORY — DX: Myoneural disorder, unspecified: G70.9

## 2011-10-31 HISTORY — DX: Essential (primary) hypertension: I10

## 2011-10-31 HISTORY — DX: Unspecified osteoarthritis, unspecified site: M19.90

## 2011-10-31 HISTORY — DX: Personal history of other diseases of the digestive system: Z87.19

## 2011-10-31 LAB — URINALYSIS, ROUTINE W REFLEX MICROSCOPIC
Glucose, UA: NEGATIVE mg/dL
Leukocytes, UA: NEGATIVE
Specific Gravity, Urine: 1.011 (ref 1.005–1.030)
pH: 7 (ref 5.0–8.0)

## 2011-10-31 LAB — COMPREHENSIVE METABOLIC PANEL
ALT: 19 U/L (ref 0–35)
AST: 40 U/L — ABNORMAL HIGH (ref 0–37)
CO2: 25 mEq/L (ref 19–32)
Chloride: 98 mEq/L (ref 96–112)
GFR calc Af Amer: >90 mL/min (ref >90)
GFR calc non Af Amer: 82 mL/min — ABNORMAL LOW (ref >90)
Glucose, Bld: 96 mg/dL (ref 70–99)
Sodium: 135 mEq/L (ref 135–145)
Total Bilirubin: 0.6 mg/dL (ref 0.3–1.2)

## 2011-10-31 LAB — CBC
Hemoglobin: 14.4 g/dL (ref 12.0–15.0)
MCV: 84.5 fL (ref 78.0–100.0)
Platelets: 206 10*3/uL (ref 150–400)
RBC: 4.77 MIL/uL (ref 3.87–5.11)
WBC: 8 10*3/uL (ref 4.0–10.5)

## 2011-10-31 LAB — SURGICAL PCR SCREEN
MRSA, PCR: NEGATIVE
Staphylococcus aureus: NEGATIVE

## 2011-10-31 LAB — ABO/RH: ABO/RH(D): A POS

## 2011-10-31 NOTE — Pre-Procedure Instructions (Signed)
20 Morgan Boyer  10/31/2011   Your procedure is scheduled on:  11/07/11  Wednesday  Report to [facility] at 0530 AM.  Call this number if you have problems the morning of surgery: [phone]   Remember:   Do not eat food:After Midnight.  May have  liquids:until Midnight  .  Take these medicines the morning of surgery with A SIP OF WATER: prilosec   Do not wear jewelry, make-up or nail polish.  Do not wear lotions, powders, or perfumes. You may wear deodorant.  Do not shave 48 hours prior to surgery. Men may shave face and neck.  Do not bring valuables to the hospital.  Contacts, dentures or bridgework may not be worn into surgery.  Leave suitcase in the car. After surgery it may be brought to your room.  For patients admitted to the hospital, checkout time is 11:00 AM the day of discharge.   Patients discharged the day of surgery will not be allowed to drive home.  Name and phone number of your driver: Morgan Boyer [phone]   Morgan Boyer [phone]  Special Instructions: CHG Shower Use Special Wash: 1/2 bottle night before surgery and 1/2 bottle morning of surgery.   Please read over the following fact sheets that you were given: Pain Booklet, Coughing and Deep Breathing, Blood Transfusion Information, Lab Information, Total Joint Packet, MRSA Information and Surgical Site Infection Prevention

## 2011-11-01 NOTE — Consult Note (Addendum)
Anesthesia chart review: Patient is a 78 year old female scheduled for left total knee replacement by Dr. Eulah Pont on 11/07/2011.  History includes peripheral neuropathy, hiatal hernia, hypertension, RA, gout, riight TKR '02, bowel surgery for diverticulitis, LOA and appendectomy '09. Smoking status was not documented at her PAT visit, but PCP notes state that she has never smoked.  PCP is Dr. Kirby Funk.  He saw her on 10/29/11 for medical clearance.  His note mentions that she has recently lost 13 lbs with dietary changes and riding a stationary bike.  Labs noted.  AST 40, ALT 19.  Cr 0.68, CBC WNL, PT/INR 15.3/1.18, PTT 38.  CXR from 10/31/11 showed: Hyperaeration and bronchitic changes related to COPD. No active cardiopulmonary disease.  EKG from 10/31/11 showed NSR with first degree AVB, LAD, incomplete right BBB, septal infarct (age undetermined).  There is also scooping ST elevation in V1-3.  She had minimal ST abnormality in V1-2 on 07/18/07, but overall changes are more pronounced since then.  She did not report any chest pain at her recent visit with Dr. Valentina Lucks.  No chest pain symptoms were documented at her PAT visit.  I was not asked to see Ms. Hauth at her PAT visit. There is no history of EKGs, stress, or echo at [facility] since at least 2009.    I reviewed above with Anesthesiologist Dr. Noreene Larsson.  In the absence of no known history of CAD without CV symptoms, will defer decision for additional work-up, if any, to Dr. Valentina Lucks.  I called and spoke with Dr. Valentina Lucks and have faxed him her 10/31/11 EKGs with three comparison EKGs.  I asked him to have his office staff call and update me on his recommendations.  Shonna Chock, PA-C 11/01/11 1438  Addendum: 11/02/11 0900 Received a call back from Dr. Valentina Lucks.  He has reviewed Ms. Tedesco's past EKGs.  He does not recommend any additional work-up pre-operatively if she remains asymptomatic.

## 2011-11-02 DIAGNOSIS — M9981 Other biomechanical lesions of cervical region: Secondary | ICD-10-CM | POA: Diagnosis not present

## 2011-11-02 DIAGNOSIS — M999 Biomechanical lesion, unspecified: Secondary | ICD-10-CM | POA: Diagnosis not present

## 2011-11-06 MED ORDER — CEFAZOLIN SODIUM-DEXTROSE 2-3 GM-% IV SOLR
2.0000 g | INTRAVENOUS | Status: AC
  Administered 2011-11-07: 2 g via INTRAVENOUS
  Filled 2011-11-06: qty 50

## 2011-11-06 NOTE — H&P (Signed)
[facility] [address]   [address] [place], [place] [address] [phone] A Division of [facility]  Loreta Ave, M.D.     Robert A. Thurston Hole, M.D.     Lunette Stands, M.D. Eulas Post, M.D.    Buford Dresser, M.D. Estell Harpin, M.D. Genene Churn. Barry Dienes, PA-C            Kirstin A. Shepperson, PA-C [place], OPA-C   RE: Morgan Boyer, Morgan Boyer   [phone]      DOB: [birth year] PROGRESS NOTE: 10-26-11 Chief complaint: left knee pain. History of present illness: 78 year old white female with end stage degenerative joint disease left knee with chronic pain returns. She wants to proceed with total knee replacement as scheduled.  Current medications: Prilosec Lisinopril/HCTZ, aspirin, vitamins, stool softener Amitriptyline Simvastatin gabapentin Valeria, ibuprofen. Drug allergies: Septra. Past medical/surgical history: hypertension GERD hypercholesterolemia neuropathy hysterectomy tubal ligation diverticulitis appendectomy shingles. Family history positive for hypertension stroke heart disease and diabetes. Social history: she's married, denies smoking or alcohol use. Review of systems: she denies fever chills cardiac pulmonary GI GU issues.  EXAMINATION: Height 5'2" 148 pounds. Blood pressure is 156/91 respirations 16 temp 98.4 pulse 70. Alert and oriented x3 in no acute distress. Antalgic gait. Alert and oriented x3 in no acute distress.. Head is normal cephalic atraumatic. PERRLA and EOMI. Lungs CTA bilaterally. No wheezes noted. Heart regular rate and rhythm. No murmurs. Abdomen round non-distended. NABS x4. Soft non-tender. Left knee decreased range of motion positive crepitus positive effusion ligaments stable joint line tenderness. Calf non-tender neurovascularly intact.  Skin warm and dry. No increase in respiratory effort.   IMPRESSION: Left knee end stage degenerative joint disease with chronic pain.  DISPOSITION: She's  scheduled to see Dr. Valentina Lucks next week for clearance. Discussed risks benefits and possible complications in detail. All questions answered. Her husband and sons will help post-op.   Loreta Ave, M.D.  Electronically verified by Loreta Ave, M.D. DFM(JMO):kh cc:  Kirby Funk, MD fax [phone]  D 10-30-11 T 10-30-11

## 2011-11-07 ENCOUNTER — Encounter (HOSPITAL_COMMUNITY): Payer: Self-pay | Admitting: Vascular Surgery

## 2011-11-07 ENCOUNTER — Encounter (HOSPITAL_COMMUNITY): Payer: Self-pay | Admitting: *Deleted

## 2011-11-07 ENCOUNTER — Ambulatory Visit (HOSPITAL_COMMUNITY): Payer: Medicare Other | Admitting: Vascular Surgery

## 2011-11-07 ENCOUNTER — Inpatient Hospital Stay (HOSPITAL_COMMUNITY)
Admission: RE | Admit: 2011-11-07 | Discharge: 2011-11-10 | DRG: 470 | Disposition: A | Discharge status: Skilled Nursing Facility | Payer: Medicare Other | Source: Ambulatory Visit | Attending: Orthopedic Surgery | Admitting: Orthopedic Surgery

## 2011-11-07 ENCOUNTER — Inpatient Hospital Stay (HOSPITAL_COMMUNITY): Payer: Medicare Other

## 2011-11-07 ENCOUNTER — Encounter (HOSPITAL_COMMUNITY)
Admission: RE | Disposition: A | Discharge status: Skilled Nursing Facility | Payer: Self-pay | Source: Ambulatory Visit | Attending: Orthopedic Surgery

## 2011-11-07 DIAGNOSIS — M25569 Pain in unspecified knee: Secondary | ICD-10-CM | POA: Diagnosis not present

## 2011-11-07 DIAGNOSIS — Z79899 Other long term (current) drug therapy: Secondary | ICD-10-CM | POA: Diagnosis not present

## 2011-11-07 DIAGNOSIS — F3289 Other specified depressive episodes: Secondary | ICD-10-CM | POA: Diagnosis not present

## 2011-11-07 DIAGNOSIS — I10 Essential (primary) hypertension: Secondary | ICD-10-CM | POA: Diagnosis present

## 2011-11-07 DIAGNOSIS — G609 Hereditary and idiopathic neuropathy, unspecified: Secondary | ICD-10-CM | POA: Diagnosis present

## 2011-11-07 DIAGNOSIS — K449 Diaphragmatic hernia without obstruction or gangrene: Secondary | ICD-10-CM | POA: Diagnosis present

## 2011-11-07 DIAGNOSIS — E785 Hyperlipidemia, unspecified: Secondary | ICD-10-CM | POA: Diagnosis not present

## 2011-11-07 DIAGNOSIS — M1990 Unspecified osteoarthritis, unspecified site: Secondary | ICD-10-CM | POA: Diagnosis not present

## 2011-11-07 DIAGNOSIS — E7800 Pure hypercholesterolemia, unspecified: Secondary | ICD-10-CM | POA: Diagnosis present

## 2011-11-07 DIAGNOSIS — K5900 Constipation, unspecified: Secondary | ICD-10-CM | POA: Diagnosis not present

## 2011-11-07 DIAGNOSIS — M1710 Unilateral primary osteoarthritis, unspecified knee: Principal | ICD-10-CM | POA: Diagnosis present

## 2011-11-07 DIAGNOSIS — Z5189 Encounter for other specified aftercare: Secondary | ICD-10-CM | POA: Diagnosis not present

## 2011-11-07 DIAGNOSIS — IMO0002 Reserved for concepts with insufficient information to code with codable children: Secondary | ICD-10-CM | POA: Diagnosis not present

## 2011-11-07 DIAGNOSIS — Z471 Aftercare following joint replacement surgery: Secondary | ICD-10-CM | POA: Diagnosis not present

## 2011-11-07 DIAGNOSIS — G8918 Other acute postprocedural pain: Secondary | ICD-10-CM | POA: Diagnosis not present

## 2011-11-07 DIAGNOSIS — Z7982 Long term (current) use of aspirin: Secondary | ICD-10-CM | POA: Diagnosis not present

## 2011-11-07 DIAGNOSIS — K2100 Gastro-esophageal reflux disease with esophagitis, without bleeding: Secondary | ICD-10-CM | POA: Diagnosis not present

## 2011-11-07 DIAGNOSIS — K219 Gastro-esophageal reflux disease without esophagitis: Secondary | ICD-10-CM | POA: Diagnosis present

## 2011-11-07 DIAGNOSIS — Z96659 Presence of unspecified artificial knee joint: Secondary | ICD-10-CM | POA: Diagnosis not present

## 2011-11-07 HISTORY — PX: TOTAL KNEE ARTHROPLASTY: SHX125

## 2011-11-07 SURGERY — ARTHROPLASTY, KNEE, TOTAL
Anesthesia: General | Site: Knee | Laterality: Left | Wound class: Clean

## 2011-11-07 MED ORDER — FENTANYL CITRATE 0.05 MG/ML IJ SOLN
INTRAMUSCULAR | Status: DC | PRN
  Administered 2011-11-07: 25 ug via INTRAVENOUS
  Administered 2011-11-07 (×2): 50 ug via INTRAVENOUS

## 2011-11-07 MED ORDER — LACTATED RINGERS IV SOLN
INTRAVENOUS | Status: DC | PRN
  Administered 2011-11-07 (×2): via INTRAVENOUS

## 2011-11-07 MED ORDER — METOCLOPRAMIDE HCL 10 MG PO TABS: 5.0000 mg | ORAL_TABLET | Freq: Three times a day (TID) | ORAL | Status: DC | PRN

## 2011-11-07 MED ORDER — MIDAZOLAM HCL 5 MG/5ML IJ SOLN
INTRAMUSCULAR | Status: DC | PRN
  Administered 2011-11-07: 1 mg via INTRAVENOUS

## 2011-11-07 MED ORDER — AMITRIPTYLINE HCL 25 MG PO TABS
25.0000 mg | ORAL_TABLET | Freq: Every day | ORAL | Status: DC
  Administered 2011-11-07 – 2011-11-09 (×3): 25 mg via ORAL
  Filled 2011-11-07 (×4): qty 1

## 2011-11-07 MED ORDER — BUPIVACAINE-EPINEPHRINE PF 0.5-1:200000 % IJ SOLN
INTRAMUSCULAR | Status: DC | PRN
  Administered 2011-11-07: 30 mL

## 2011-11-07 MED ORDER — PROPOFOL 10 MG/ML IV EMUL
INTRAVENOUS | Status: DC | PRN
  Administered 2011-11-07: 80 mg via INTRAVENOUS
  Administered 2011-11-07: 120 mg via INTRAVENOUS
  Administered 2011-11-07: 60 mg via INTRAVENOUS

## 2011-11-07 MED ORDER — CEFAZOLIN SODIUM 1-5 GM-% IV SOLN
1.0000 g | Freq: Three times a day (TID) | INTRAVENOUS | Status: AC
  Administered 2011-11-07 (×2): 1 g via INTRAVENOUS
  Filled 2011-11-07 (×2): qty 50

## 2011-11-07 MED ORDER — HYDROMORPHONE HCL PF 1 MG/ML IJ SOLN
INTRAMUSCULAR | Status: AC
  Filled 2011-11-07: qty 1

## 2011-11-07 MED ORDER — MORPHINE SULFATE 2 MG/ML IJ SOLN
INTRAMUSCULAR | Status: AC
  Filled 2011-11-07: qty 2

## 2011-11-07 MED ORDER — HYDROCODONE-ACETAMINOPHEN 7.5-325 MG PO TABS
1.0000 | ORAL_TABLET | ORAL | Status: DC | PRN
  Administered 2011-11-08: 2 via ORAL
  Administered 2011-11-08: 1 via ORAL
  Administered 2011-11-08 – 2011-11-10 (×6): 2 via ORAL
  Filled 2011-11-07 (×2): qty 1
  Filled 2011-11-07 (×2): qty 2
  Filled 2011-11-07: qty 1
  Filled 2011-11-07 (×4): qty 2

## 2011-11-07 MED ORDER — WARFARIN SODIUM 4 MG PO TABS
4.0000 mg | ORAL_TABLET | Freq: Every day | ORAL | Status: AC
  Administered 2011-11-07: 4 mg via ORAL
  Filled 2011-11-07: qty 1

## 2011-11-07 MED ORDER — GABAPENTIN 100 MG PO CAPS
100.0000 mg | ORAL_CAPSULE | Freq: Every day | ORAL | Status: DC | PRN
  Filled 2011-11-07: qty 1

## 2011-11-07 MED ORDER — ONDANSETRON HCL 4 MG/2ML IJ SOLN
INTRAMUSCULAR | Status: DC | PRN
  Administered 2011-11-07: 4 mg via INTRAVENOUS

## 2011-11-07 MED ORDER — METHOCARBAMOL 100 MG/ML IJ SOLN
500.0000 mg | Freq: Four times a day (QID) | INTRAMUSCULAR | Status: DC | PRN
  Administered 2011-11-07: 500 mg via INTRAVENOUS
  Filled 2011-11-07: qty 5

## 2011-11-07 MED ORDER — DOCUSATE SODIUM 100 MG PO CAPS
100.0000 mg | ORAL_CAPSULE | Freq: Two times a day (BID) | ORAL | Status: DC
  Administered 2011-11-07 – 2011-11-10 (×6): 100 mg via ORAL
  Filled 2011-11-07 (×7): qty 1

## 2011-11-07 MED ORDER — BUPIVACAINE HCL (PF) 0.25 % IJ SOLN
INTRAMUSCULAR | Status: DC | PRN
  Administered 2011-11-07: 30 mL

## 2011-11-07 MED ORDER — ENOXAPARIN SODIUM 30 MG/0.3ML ~~LOC~~ SOLN
30.0000 mg | Freq: Two times a day (BID) | SUBCUTANEOUS | Status: DC
  Administered 2011-11-08 – 2011-11-10 (×5): 30 mg via SUBCUTANEOUS
  Filled 2011-11-07 (×7): qty 0.3

## 2011-11-07 MED ORDER — ACETAMINOPHEN 325 MG PO TABS: 650.0000 mg | ORAL_TABLET | Freq: Four times a day (QID) | ORAL | Status: DC | PRN

## 2011-11-07 MED ORDER — DEXAMETHASONE SODIUM PHOSPHATE 4 MG/ML IJ SOLN
INTRAMUSCULAR | Status: DC | PRN
  Administered 2011-11-07: 10 mg via INTRAVENOUS

## 2011-11-07 MED ORDER — GABAPENTIN 600 MG PO TABS
600.0000 mg | ORAL_TABLET | Freq: Every day | ORAL | Status: DC
  Administered 2011-11-07 – 2011-11-09 (×3): 600 mg via ORAL
  Filled 2011-11-07 (×4): qty 1

## 2011-11-07 MED ORDER — GABAPENTIN 300 MG PO CAPS
600.0000 mg | ORAL_CAPSULE | Freq: Every day | ORAL | Status: DC
  Filled 2011-11-07: qty 2

## 2011-11-07 MED ORDER — HYDROMORPHONE HCL PF 1 MG/ML IJ SOLN
0.2500 mg | INTRAMUSCULAR | Status: DC | PRN
  Administered 2011-11-07 (×2): 0.5 mg via INTRAVENOUS

## 2011-11-07 MED ORDER — ACETAMINOPHEN 10 MG/ML IV SOLN
INTRAVENOUS | Status: AC
  Filled 2011-11-07: qty 100

## 2011-11-07 MED ORDER — POTASSIUM CHLORIDE IN NACL 20-0.9 MEQ/L-% IV SOLN
INTRAVENOUS | Status: DC
  Administered 2011-11-07 – 2011-11-08 (×2): via INTRAVENOUS
  Filled 2011-11-07 (×8): qty 1000

## 2011-11-07 MED ORDER — WARFARIN - PHARMACIST DOSING INPATIENT: Freq: Every day | Status: DC

## 2011-11-07 MED ORDER — SODIUM CHLORIDE 0.9 % IR SOLN
Status: DC | PRN
  Administered 2011-11-07: 1000 mL
  Administered 2011-11-07: 3000 mL

## 2011-11-07 MED ORDER — LIDOCAINE HCL (CARDIAC) 20 MG/ML IV SOLN
INTRAVENOUS | Status: DC | PRN
  Administered 2011-11-07: 80 mg via INTRAVENOUS

## 2011-11-07 MED ORDER — ACETAMINOPHEN 10 MG/ML IV SOLN
1000.0000 mg | Freq: Once | INTRAVENOUS | Status: AC
  Administered 2011-11-07: 1000 mg via INTRAVENOUS

## 2011-11-07 MED ORDER — PATIENT'S GUIDE TO USING COUMADIN BOOK
Freq: Once | Status: AC
  Administered 2011-11-07: 21:00:00
  Filled 2011-11-07: qty 1

## 2011-11-07 MED ORDER — METOCLOPRAMIDE HCL 5 MG/ML IJ SOLN
5.0000 mg | Freq: Three times a day (TID) | INTRAMUSCULAR | Status: DC | PRN
Start: 2011-11-07 — End: 2011-11-10
  Administered 2011-11-07: 10 mg via INTRAVENOUS
  Filled 2011-11-07: qty 2

## 2011-11-07 MED ORDER — PANTOPRAZOLE SODIUM 40 MG PO TBEC
40.0000 mg | DELAYED_RELEASE_TABLET | Freq: Every day | ORAL | Status: DC
  Administered 2011-11-08 – 2011-11-10 (×3): 40 mg via ORAL
  Filled 2011-11-07 (×3): qty 1

## 2011-11-07 MED ORDER — ONDANSETRON HCL 4 MG/2ML IJ SOLN
4.0000 mg | Freq: Four times a day (QID) | INTRAMUSCULAR | Status: DC | PRN
  Administered 2011-11-07: 4 mg via INTRAVENOUS
  Filled 2011-11-07: qty 2

## 2011-11-07 MED ORDER — SUCCINYLCHOLINE CHLORIDE 20 MG/ML IJ SOLN
INTRAMUSCULAR | Status: DC | PRN
  Administered 2011-11-07: 100 mg via INTRAVENOUS

## 2011-11-07 MED ORDER — MENTHOL 3 MG MT LOZG: 1.0000 | LOZENGE | OROMUCOSAL | Status: DC | PRN

## 2011-11-07 MED ORDER — HYDROMORPHONE HCL PF 1 MG/ML IJ SOLN
0.5000 mg | INTRAMUSCULAR | Status: DC | PRN
  Administered 2011-11-07 – 2011-11-08 (×2): 0.5 mg via INTRAVENOUS
  Filled 2011-11-07 (×2): qty 1

## 2011-11-07 MED ORDER — BUPIVACAINE HCL (PF) 0.25 % IJ SOLN
INTRAMUSCULAR | Status: AC
  Filled 2011-11-07: qty 30

## 2011-11-07 MED ORDER — WARFARIN VIDEO: Freq: Once | Status: DC

## 2011-11-07 MED ORDER — ONDANSETRON HCL 4 MG PO TABS: 4.0000 mg | ORAL_TABLET | Freq: Four times a day (QID) | ORAL | Status: DC | PRN

## 2011-11-07 MED ORDER — ACETAMINOPHEN 650 MG RE SUPP: 650.0000 mg | Freq: Four times a day (QID) | RECTAL | Status: DC | PRN

## 2011-11-07 MED ORDER — PHENOL 1.4 % MT LIQD: 1.0000 | OROMUCOSAL | Status: DC | PRN

## 2011-11-07 MED ORDER — MORPHINE SULFATE 4 MG/ML IJ SOLN
INTRAMUSCULAR | Status: DC | PRN
  Administered 2011-11-07 (×2): 2 mg via INTRAVENOUS

## 2011-11-07 MED ORDER — SIMVASTATIN 20 MG PO TABS
20.0000 mg | ORAL_TABLET | Freq: Every day | ORAL | Status: DC
  Administered 2011-11-07 – 2011-11-09 (×3): 20 mg via ORAL
  Filled 2011-11-07 (×4): qty 1

## 2011-11-07 MED ORDER — METHOCARBAMOL 500 MG PO TABS
500.0000 mg | ORAL_TABLET | Freq: Four times a day (QID) | ORAL | Status: DC | PRN
  Administered 2011-11-09: 500 mg via ORAL
  Filled 2011-11-07 (×2): qty 1

## 2011-11-07 SURGICAL SUPPLY — 56 items
BANDAGE ESMARK 6X9 LF (GAUZE/BANDAGES/DRESSINGS) ×1 IMPLANT
BLADE SAG 18X100X1.27 (BLADE) ×3 IMPLANT
BNDG CMPR 9X6 STRL LF SNTH (GAUZE/BANDAGES/DRESSINGS) ×1
BNDG ESMARK 6X9 LF (GAUZE/BANDAGES/DRESSINGS) ×2
BOOTCOVER CLEANROOM LRG (PROTECTIVE WEAR) ×4 IMPLANT
BOWL SMART MIX CTS (DISPOSABLE) ×2 IMPLANT
CEMENT BONE SIMPLEX SPEEDSET (Cement) ×4 IMPLANT
CLOTH BEACON ORANGE TIMEOUT ST (SAFETY) ×2 IMPLANT
COVER BACK TABLE 24X17X13 BIG (DRAPES) ×2 IMPLANT
COVER SURGICAL LIGHT HANDLE (MISCELLANEOUS) ×2 IMPLANT
CUFF TOURNIQUET SINGLE 34IN LL (TOURNIQUET CUFF) ×2 IMPLANT
DRAPE EXTREMITY T 121X128X90 (DRAPE) ×2 IMPLANT
DRAPE PROXIMA HALF (DRAPES) ×2 IMPLANT
DRAPE U-SHAPE 47X51 STRL (DRAPES) ×2 IMPLANT
DRSG PAD ABDOMINAL 8X10 ST (GAUZE/BANDAGES/DRESSINGS) ×2 IMPLANT
DURAPREP 26ML APPLICATOR (WOUND CARE) ×2 IMPLANT
ELECT CAUTERY BLADE 6.4 (BLADE) ×2 IMPLANT
ELECT REM PT RETURN 9FT ADLT (ELECTROSURGICAL) ×2
ELECTRODE REM PT RTRN 9FT ADLT (ELECTROSURGICAL) ×1 IMPLANT
EVACUATOR 1/8 PVC DRAIN (DRAIN) ×2 IMPLANT
FACESHIELD LNG OPTICON STERILE (SAFETY) ×2 IMPLANT
GAUZE XEROFORM 5X9 LF (GAUZE/BANDAGES/DRESSINGS) ×2 IMPLANT
GLOVE BIOGEL PI IND STRL 8 (GLOVE) ×1 IMPLANT
GLOVE BIOGEL PI INDICATOR 8 (GLOVE) ×1
GLOVE ORTHO TXT STRL SZ7.5 (GLOVE) ×2 IMPLANT
GOWN PREVENTION PLUS XLARGE (GOWN DISPOSABLE) ×4 IMPLANT
GOWN STRL NON-REIN LRG LVL3 (GOWN DISPOSABLE) ×4 IMPLANT
GOWN STRL REIN 2XL XLG LVL4 (GOWN DISPOSABLE) ×2 IMPLANT
HANDPIECE INTERPULSE COAX TIP (DISPOSABLE) ×2
IMMOBILIZER KNEE 22 UNIV (SOFTGOODS) ×2 IMPLANT
IMMOBILIZER KNEE 24 THIGH 36 (MISCELLANEOUS) IMPLANT
IMMOBILIZER KNEE 24 UNIV (MISCELLANEOUS)
KIT BASIN OR (CUSTOM PROCEDURE TRAY) ×2 IMPLANT
KIT ROOM TURNOVER OR (KITS) ×2 IMPLANT
MANIFOLD NEPTUNE II (INSTRUMENTS) ×2 IMPLANT
NS IRRIG 1000ML POUR BTL (IV SOLUTION) ×2 IMPLANT
PACK TOTAL JOINT (CUSTOM PROCEDURE TRAY) ×2 IMPLANT
PAD ARMBOARD 7.5X6 YLW CONV (MISCELLANEOUS) ×4 IMPLANT
PAD CAST 4YDX4 CTTN HI CHSV (CAST SUPPLIES) ×1 IMPLANT
PADDING CAST COTTON 4X4 STRL (CAST SUPPLIES) ×2
PADDING CAST COTTON 6X4 STRL (CAST SUPPLIES) ×2 IMPLANT
RUBBERBAND STERILE (MISCELLANEOUS) ×2 IMPLANT
SET HNDPC FAN SPRY TIP SCT (DISPOSABLE) ×1 IMPLANT
SPONGE GAUZE 4X4 12PLY (GAUZE/BANDAGES/DRESSINGS) ×2 IMPLANT
STAPLER VISISTAT 35W (STAPLE) ×2 IMPLANT
SUCTION FRAZIER TIP 10 FR DISP (SUCTIONS) ×2 IMPLANT
SUT VIC AB 1 CTX 36 (SUTURE) ×4
SUT VIC AB 1 CTX36XBRD ANBCTR (SUTURE) ×2 IMPLANT
SUT VIC AB 2-0 CT1 27 (SUTURE) ×4
SUT VIC AB 2-0 CT1 TAPERPNT 27 (SUTURE) ×2 IMPLANT
SYR 30ML LL (SYRINGE) ×2 IMPLANT
SYR 30ML SLIP (SYRINGE) ×2 IMPLANT
TOWEL OR 17X24 6PK STRL BLUE (TOWEL DISPOSABLE) ×2 IMPLANT
TOWEL OR 17X26 10 PK STRL BLUE (TOWEL DISPOSABLE) ×2 IMPLANT
TRAY FOLEY CATH 14FR (SET/KITS/TRAYS/PACK) ×2 IMPLANT
WATER STERILE IRR 1000ML POUR (IV SOLUTION) ×4 IMPLANT

## 2011-11-07 NOTE — Anesthesia Preprocedure Evaluation (Addendum)
Anesthesia Evaluation  Patient identified by MRN, date of birth, ID band Patient awake    Reviewed: Allergy & Precautions, H&P , NPO status , Patient's Chart, lab work & pertinent test results  Airway Mallampati: I TM Distance: >3 FB Neck ROM: Full    Dental No notable dental hx. (+) Teeth Intact and Dental Advisory Given   Pulmonary neg pulmonary ROS,  breath sounds clear to auscultation  Pulmonary exam normal       Cardiovascular hypertension, On Medications Rhythm:Regular Rate:Normal     Neuro/Psych  Neuromuscular disease negative psych ROS   GI/Hepatic Neg liver ROS, hiatal hernia, GERD-  Medicated,  Endo/Other  negative endocrine ROS  Renal/GU negative Renal ROS  negative genitourinary   Musculoskeletal   Abdominal   Peds  Hematology negative hematology ROS (+)   Anesthesia Other Findings   Reproductive/Obstetrics negative OB ROS                         Anesthesia Physical Anesthesia Plan  ASA: II  Anesthesia Plan: General   Post-op Pain Management:    Induction: Intravenous  Airway Management Planned: LMA  Additional Equipment:   Intra-op Plan:   Post-operative Plan: Extubation in OR  Informed Consent: I have reviewed the patients History and Physical, chart, labs and discussed the procedure including the risks, benefits and alternatives for the proposed anesthesia with the patient or authorized representative who has indicated his/her understanding and acceptance.   Dental advisory given  Plan Discussed with: CRNA and Anesthesiologist  Anesthesia Plan Comments:        Anesthesia Quick Evaluation

## 2011-11-07 NOTE — Interval H&P Note (Signed)
History and Physical Interval Note:  11/07/2011 8:15 AM  Morgan Boyer  has presented today for surgery, with the diagnosis of DJD LEFT KNEE  The various methods of treatment have been discussed with the patient and family. After consideration of risks, benefits and other options for treatment, the patient has consented to  Procedure(s) (LRB): TOTAL KNEE ARTHROPLASTY (Left) as a surgical intervention .  The patient's history has been reviewed, patient examined, no change in status, stable for surgery.  I have reviewed the patient's chart and labs.  Questions were answered to the patient's satisfaction.     Lexine Jaspers F

## 2011-11-07 NOTE — Progress Notes (Signed)
Orthopedic Tech Progress Note Patient Details:  Morgan Boyer 1932-01-06 [id number]  CPM Left Knee CPM Left Knee: On Left Knee Flexion (Degrees): 60  Left Knee Extension (Degrees): 0    Shawnie Pons 11/07/2011, 10:58 AM

## 2011-11-07 NOTE — Progress Notes (Signed)
ANTICOAGULATION CONSULT NOTE - Initial Consult  Pharmacy Consult for Coumadin Indication: VTE prophylaxis  Allergies  Allergen Reactions  . Septra (Sulfamethoxazole-Tmp Ds) Hives   Labs: No results found for this basename: HGB:2,HCT:3,PLT:3,APTT:3,LABPROT:3,INR:3,HEPARINUNFRC:3,CREATININE:3,CKTOTAL:3,CKMB:3,TROPONINI:3 in the last 72 hours  CrCl is unknown because there is no height on file for the current visit.  Assessment: 78 year old s/p TKA Beginning Coumadin for VTE prophylaxis  Goal of Therapy:  INR 2-3 Monitor platelets by anticoagulation protocol: Yes   Plan:  1) Coumadin 4 mg po daily  2) Daily PT / INR  Thank you. Okey Regal, PharmD 11/07/2011,12:46 PM

## 2011-11-07 NOTE — Brief Op Note (Signed)
11/07/2011  12:44 PM  PATIENT:  Morgan Boyer  78 y.o. female  PRE-OPERATIVE DIAGNOSIS:  DJD LEFT KNEE  POST-OPERATIVE DIAGNOSIS:  DJD LEFT KNEE  PROCEDURE:  Procedure(s) (LRB): TOTAL KNEE ARTHROPLASTY (Left)  SURGEON:  Surgeon(s) and Role:    * Loreta Ave, MD - Primary  PHYSICIAN ASSISTANT: Zonia Kief M   }   ANESTHESIA:   regional and general  EBL:  Total I/O In: 1300 [I.V.:1300] Out: 150 [Urine:150]  BLOOD ADMINISTERED:none   SPECIMEN:  No Specimen  DISPOSITION OF SPECIMEN:  N/A  COUNTS:  YES  TOURNIQUET:   Total Tourniquet Time Documented: Thigh (Left) - 59 minutes     PATIENT DISPOSITION:  PACU - hemodynamically stable.

## 2011-11-07 NOTE — Transfer of Care (Signed)
Immediate Anesthesia Transfer of Care Note  Patient: Morgan Boyer  Procedure(s) Performed: Procedure(s) (LRB): TOTAL KNEE ARTHROPLASTY (Left)  Patient Location: PACU  Anesthesia Type: GA combined with regional for post-op pain  Level of Consciousness: awake, patient cooperative and lethargic  Airway & Oxygen Therapy: Patient Spontanous Breathing and Patient connected to nasal cannula oxygen  Post-op Assessment: Report given to PACU RN and Post -op Vital signs reviewed and stable  Post vital signs: Reviewed and stable  Complications: No apparent anesthesia complications

## 2011-11-07 NOTE — Preoperative (Signed)
Beta Blockers   Reason not to administer Beta Blockers:Not Applicable 

## 2011-11-07 NOTE — Anesthesia Postprocedure Evaluation (Signed)
  Anesthesia Post-op Note  Patient: Morgan Boyer  Procedure(s) Performed: Procedure(s) (LRB): TOTAL KNEE ARTHROPLASTY (Left)  Patient Location: PACU  Anesthesia Type: GA combined with regional for post-op pain  Level of Consciousness: awake  Airway and Oxygen Therapy: Patient Spontanous Breathing and Patient connected to nasal cannula oxygen  Post-op Pain: moderate  Post-op Assessment: Post-op Vital signs reviewed, Patient's Cardiovascular Status Stable, Respiratory Function Stable, Patent Airway and No signs of Nausea or vomiting  Post-op Vital Signs: Reviewed and stable  Complications: No apparent anesthesia complications

## 2011-11-07 NOTE — Progress Notes (Signed)
UR COMPLETED  

## 2011-11-07 NOTE — Anesthesia Procedure Notes (Addendum)
Anesthesia Regional Block:  Femoral nerve block  Pre-Anesthetic Checklist: ,, timeout performed, Correct Patient, Correct Site, Correct Laterality, Correct Procedure, Correct Position, site marked, Risks and benefits discussed, pre-op evaluation,  At surgeon's request and post-op pain management  Laterality: Left  Prep: Maximum Sterile Barrier Precautions used and chloraprep       Needles:  Injection technique: Single-shot  Needle Type: Other   (Arrow 50mm)    Needle Gauge: 22 and 22 G    Additional Needles:  Procedures: ultrasound guided and nerve stimulator Femoral nerve block  Nerve Stimulator or Paresthesia:  Response: Patellar respose, 0.4 mA,   Additional Responses:   Narrative:  Start time: 11/07/2011 7:55 AM End time: 11/07/2011 8:06 AM Injection made incrementally with aspirations every 5 mL. Anesthesiologist: Fitzgerald,MD  Additional Notes: 2% Lidocaine skin wheel.   Femoral nerve block Procedure Name: Intubation Date/Time: 11/07/2011 8:48 AM Performed by: Charm Barges, Zannie Runkle R Pre-anesthesia Checklist: Patient identified, Emergency Drugs available, Suction available, Patient being monitored and Timeout performed Patient Re-evaluated:Patient Re-evaluated prior to inductionOxygen Delivery Method: Circle system utilized Preoxygenation: Pre-oxygenation with 100% oxygen Intubation Type: Combination inhalational/ intravenous induction Ventilation: Mask ventilation without difficulty Laryngoscope Size: Mac and 3 Grade View: Grade II Tube type: Oral Tube size: 7.5 mm Number of attempts: 1 Airway Equipment and Method: Stylet Placement Confirmation: ETT inserted through vocal cords under direct vision,  positive ETCO2 and breath sounds checked- equal and bilateral Secured at: 21 cm Tube secured with: Tape Dental Injury: Teeth and Oropharynx as per pre-operative assessment and Bloody posterior oropharynx  Comments: Pre-O2, SIVI. LMA #4 Supreme applied, no etCO2 despite  repositioning and re-insertion. ETT inserted with no difficulty.

## 2011-11-07 NOTE — Plan of Care (Signed)
Problem: Consults Goal: Diagnosis- Total Joint Replacement Primary Total Knee     

## 2011-11-08 HISTORY — PX: KNEE ARTHROSCOPY: SUR90

## 2011-11-08 LAB — CBC
Hemoglobin: 10.4 g/dL — ABNORMAL LOW (ref 12.0–15.0)
MCH: 29.9 pg (ref 26.0–34.0)
RBC: 3.48 MIL/uL — ABNORMAL LOW (ref 3.87–5.11)

## 2011-11-08 LAB — BASIC METABOLIC PANEL
CO2: 28 mEq/L (ref 19–32)
Calcium: 8.2 mg/dL — ABNORMAL LOW (ref 8.4–10.5)
Chloride: 108 mEq/L (ref 96–112)
Glucose, Bld: 92 mg/dL (ref 70–99)
Sodium: 141 mEq/L (ref 135–145)

## 2011-11-08 LAB — PROTIME-INR: Prothrombin Time: 16.3 seconds — ABNORMAL HIGH (ref 11.6–15.2)

## 2011-11-08 MED ORDER — LISINOPRIL 20 MG PO TABS
20.0000 mg | ORAL_TABLET | Freq: Every day | ORAL | Status: DC
  Administered 2011-11-08 – 2011-11-10 (×3): 20 mg via ORAL
  Filled 2011-11-08 (×3): qty 1

## 2011-11-08 MED ORDER — HYDROCHLOROTHIAZIDE 25 MG PO TABS
25.0000 mg | ORAL_TABLET | Freq: Every day | ORAL | Status: DC
  Administered 2011-11-08 – 2011-11-10 (×3): 25 mg via ORAL
  Filled 2011-11-08 (×3): qty 1

## 2011-11-08 MED ORDER — WARFARIN SODIUM 4 MG PO TABS
4.0000 mg | ORAL_TABLET | Freq: Every day | ORAL | Status: DC
  Administered 2011-11-08: 4 mg via ORAL
  Filled 2011-11-08 (×2): qty 1

## 2011-11-08 NOTE — Progress Notes (Signed)
ANTICOAGULATION CONSULT NOTE - Follow-Up Consult  Pharmacy Consult for Coumadin Indication: VTE prophylaxis  Allergies  Allergen Reactions  . Septra (Sulfamethoxazole-Tmp Ds) Hives   Labs:  [facility] 11/08/11 0647  HGB 10.4*  HCT 29.5*  PLT 152  APTT --  LABPROT 16.3*  INR 1.29  HEPARINUNFRC --  CREATININE 0.65  CKTOTAL --  CKMB --  TROPONINI --    CrCl is unknown because there is no height on file for the current visit.  Assessment: 78 year old s/p TKA INR increasing No bleeding noted  Goal of Therapy:  INR 2-3 Monitor platelets by anticoagulation protocol: Yes   Plan:  1) Continue Coumadin 4 mg po daily  2) Daily PT / INR  Thank you. Okey Regal, PharmD 11/08/2011,9:19 AM

## 2011-11-08 NOTE — Progress Notes (Signed)
Physical Therapy Treatment Patient Details Name: Morgan Boyer MRN: [id number] DOB: 27-Jan-1932 Today's Date: 11/08/2011 Time: 4098-1191 PT Time Calculation (min): 28 min  PT Assessment / Plan / Recommendation Comments on Treatment Session  Pt mobility improving.  Husband present for this session.    Follow Up Recommendations  Home health PT;Supervision/Assistance - 24 hour    Barriers to Discharge        Equipment Recommendations  None recommended by PT    Recommendations for Other Services    Frequency 7X/week   Plan Discharge plan remains appropriate;Frequency remains appropriate    Precautions / Restrictions Precautions Precautions: Knee Precaution Booklet Issued: No Required Braces or Orthoses: Knee Immobilizer - Left Knee Immobilizer - Left: On except when in CPM Restrictions Weight Bearing Restrictions: Yes LLE Weight Bearing: Weight bearing as tolerated   Pertinent Vitals/Pain Pt reports pain in L knee 4-6/10 with activity.      Mobility  Bed Mobility Bed Mobility: Sit to Supine Supine to Sit: Not tested (comment) Sit to Supine: 4: Min assist Details for Bed Mobility Assistance: Min asssist to manage L LE Transfers Transfers: Sit to Stand;Stand to Sit Sit to Stand: 4: Min assist;With upper extremity assist;From chair/3-in-1 Stand to Sit: 4: Min assist;With upper extremity assist;With armrests;To bed Details for Transfer Assistance: Assist to initiate standing and control descent to sit.  Cues for L LE positioning to minimize pain.   Ambulation/Gait Ambulation/Gait Assistance: 4: Min guard Ambulation Distance (Feet): 60 Feet Assistive device: Rolling walker Ambulation/Gait Assistance Details: Cues for increased step length and gait speed. Pt able to tolerate more weight in L LE than prior session.  Gait Pattern: Step-to pattern;Decreased step length - left Gait velocity: decreased Stairs: No Wheelchair Mobility Wheelchair Mobility: No    Exercises Total  Joint Exercises Ankle Circles/Pumps: Both;10 reps;Seated   PT Diagnosis:    PT Problem List:   PT Treatment Interventions:     PT Goals Acute Rehab PT Goals PT Goal Formulation: With patient Time For Goal Achievement: 11/15/11 Potential to Achieve Goals: Good Pt will go Supine/Side to Sit: with supervision PT Goal: Supine/Side to Sit - Progress: Progressing toward goal Pt will go Sit to Supine/Side: with supervision PT Goal: Sit to Supine/Side - Progress: Progressing toward goal Pt will go Sit to Stand: with supervision PT Goal: Sit to Stand - Progress: Progressing toward goal Pt will go Stand to Sit: with supervision PT Goal: Stand to Sit - Progress: Progressing toward goal Pt will Transfer Bed to Chair/Chair to Bed: with supervision PT Transfer Goal: Bed to Chair/Chair to Bed - Progress: Progressing toward goal Pt will Ambulate: 51 - 150 feet;with rolling walker;with supervision PT Goal: Ambulate - Progress: Progressing toward goal Pt will Go Up / Down Stairs: 1-2 stairs;with min assist;with least restrictive assistive device PT Goal: Up/Down Stairs - Progress: Progressing toward goal Pt will Perform Home Exercise Program: with supervision, verbal cues required/provided PT Goal: Perform Home Exercise Program - Progress: Progressing toward goal  Visit Information  Last PT Received On: 11/08/11    Subjective Data  Subjective: My leg feels stiff and achey. Patient Stated Goal: Walk more and use my exercise bike at home.     Cognition  Overall Cognitive Status: Appears within functional limits for tasks assessed/performed Arousal/Alertness: Awake/alert Orientation Level: Oriented X4 / Intact Behavior During Session: [facility] for tasks performed    Balance  Balance Balance Assessed: No  End of Session PT - End of Session Equipment Utilized During Treatment: Gait belt  Activity Tolerance: Patient tolerated treatment well Patient left: in bed;with call bell/phone within reach;in  CPM Nurse Communication: Mobility status   GP     Sabiha Sura 11/08/2011, 3:43 PM Aneliese Beaudry L. Vanity Larsson DPT [phone]

## 2011-11-08 NOTE — Op Note (Signed)
NAMEVALARY, Morgan Boyer               ACCOUNT NO.:  [id number]  MEDICAL RECORD NO.:  [id number]  LOCATION:  [id number]                        FACILITY:  [facility]  PHYSICIAN:  Loreta Ave, M.D. DATE OF BIRTH:  01/23/1932  DATE OF PROCEDURE:  11/07/2011 DATE OF DISCHARGE:                              OPERATIVE REPORT   PREOPERATIVE DIAGNOSIS:  Left knee end-stage degenerative arthritis, varus alignment.  POSTOPERATIVE DIAGNOSIS:  Left knee end-stage degenerative arthritis, varus alignment.  PROCEDURE:  Modified minimally invasive left total knee replacement Stryker triathlon prosthesis.  Cemented pegged posterior stabilized #4 femoral component.  Cemented #4 tibial component, 11 mm polyethylene insert.  Cemented resurfacing 35 mm patellar component.  SURGEON:  Loreta Ave, M.D.  ASSISTANT:  Genene Churn. Denton Meek., present throughout the entire case, necessary for timely completion of procedure.  ANESTHESIA:  General.  BLOOD LOSS:  Minimal.  SPECIMENS:  None.  CULTURES:  None.  COMPLICATION:  None.  DRESSINGS:  Soft compressive.  TOURNIQUET TIME:  45 minutes.  DRAINS:  Hemovac x1.  DESCRIPTION OF PROCEDURE:  The patient was brought to the operating room, placed on the operating table supine position.  After adequate anesthesia has been obtained, tourniquet applied, prepped and draped in usual sterile fashion.  Exsanguinated with elevation, Esmarch. Tourniquet inflated to 250 mmHg.  Straight incision above the patella down to tibial tubercle.  Hemostasis cautery.  Medial arthrotomy, vastus splitting, preserving quad tendon.  Medial capsule release.  Knee exposed.  Grade 4 change throughout.  Remnants of menisci, periarticular spurs, loose bodies, cruciate ligaments removed.  Intramedullary guide, distal femur.  An 8-mm resection, 5 degrees of valgus.  Using epicondylar axis, the femur was sized, cut, and fitted for a pegged, posterior stabilized #4 component.   Extramedullary guide on the tibia. A 3-degree posterior slope cut.  Size 4 component as well.  Relative osteopenia throughout the femur and tibia.  Patella exposed.  Posterior 10 mm removed.  Drilled, sized, and fitted for a 35-mm patella.  Debris cleared throughout the knee in flexion and extension.  Irrigated. Trials put in place.  #4 above and below, 11 mm insert, 35 patella. With this construct, nice by mechanical axis.  Nicely balanced in flexion and extension.  Excellent patellofemoral tracking and stability. Tibia was marked for rotation and hand reamed.  Copious irrigation with a pulse irrigating device.  Cement prepared, placed on all components, firmly seated.  Polyethylene attached to tibia, knee reduced.  Patella held with a clamp.  Once cement hardened, the knee was reexamined. Again, pleased with alignment, stability, and motion.  Hemovac was placed through a separate stab wound.  Arthrotomy closed with #1 Vicryl. Skin and subcutaneous tissue with Vicryl staples.  Sterile compressive dressing applied.  Tourniquet deflated and removed.  Knee immobilizer applied.  Anesthesia reversed.  Brought to the recovery room.  Tolerated surgery well.  No complications.     Loreta Ave, M.D.     DFM/MEDQ  D:  11/07/2011  T:  11/08/2011  Job:  [id number]

## 2011-11-08 NOTE — Progress Notes (Signed)
CARE MANAGEMENT NOTE 11/08/2011  Patient:  TEAGHAN, MELROSE   Account Number:  [id number]  Date Initiated:  11/08/2011  Documentation initiated by:  Vance Peper  Subjective/Objective Assessment:   78 yr old female s/p left total knee arthroplasty.     Action/Plan:   CM spoke with patient regarding HH needs at dischagre. Choice offered. Pt preoperatively setup [facility], no changes. Pt has DME, family support at discharge.   Anticipated DC Date:  11/10/2011   Anticipated DC Plan:  HOME W HOME HEALTH SERVICES      DC Planning Services  CM consult      [facility] Choice  HOME HEALTH   Choice offered to / List presented to:  C-1 Patient        HH arranged  HH-1 RN  HH-2 PT      [facility] agency  [facility]   Status of service:  Completed, signed off Medicare Important Message given?   (If response is "NO", the following Medicare IM given date fields will be blank) Date Medicare IM given:   Date Additional Medicare IM given:    Discharge Disposition:  HOME W HOME HEALTH SERVICES  Per UR Regulation:    If discussed at Long Length of Stay Meetings, dates discussed:    Comments:

## 2011-11-08 NOTE — Progress Notes (Signed)
Physical Therapy Evaluation Patient Details Name: Morgan Boyer MRN: [id number] DOB: September 07, 1931 Today's Date: 11/08/2011 Time: 7829-5621 PT Time Calculation (min): 28 min  PT Assessment / Plan / Recommendation Clinical Impression  Pt is a 78 y/o female s/p L TKA. Acute PT to follow pt in prepartation for discharge to home with HHPT and support of spouse.      PT Assessment  Patient needs continued PT services    Follow Up Recommendations  Home health PT;Supervision/Assistance - 24 hour    Barriers to Discharge None      Equipment Recommendations  None recommended by PT    Recommendations for Other Services     Frequency      Precautions / Restrictions Precautions Precautions: Knee Required Braces or Orthoses: Knee Immobilizer - Left Knee Immobilizer - Left: On except when in CPM Restrictions Weight Bearing Restrictions: Yes LLE Weight Bearing: Weight bearing as tolerated   Pertinent Vitals/Pain Pt reports pain in knee 4/10. Pt was medicated about 30 minutes prior to PT session.      Mobility  Bed Mobility Bed Mobility: Supine to Sit Supine to Sit: 4: Min assist Details for Bed Mobility Assistance: Min asssist to manage L LE Transfers Transfers: Sit to Stand;Stand to Sit Sit to Stand: 4: Min assist;With upper extremity assist;From bed Stand to Sit: 4: Min assist;With upper extremity assist;To chair/3-in-1;With armrests Details for Transfer Assistance: Assist to initiate standing and control descent to sit.  Cues for L LE positioning to minimize pain.   Ambulation/Gait Ambulation/Gait Assistance: 4: Min assist Ambulation Distance (Feet): 10 Feet Assistive device: Rolling walker Ambulation/Gait Assistance Details: Cues for WBAT in LLE, cues for gait sequencing .   Gait Pattern: Step-to pattern;Decreased step length - left Gait velocity: decreased Stairs: No Wheelchair Mobility Wheelchair Mobility: No    Exercises Total Joint Exercises Ankle Circles/Pumps:  Both;10 reps;Seated Quad Sets: 5 reps;Seated;Left Goniometric ROM: 8-80 degrees of AAROM L knee   PT Diagnosis: Difficulty walking;Acute pain  PT Problem List: Decreased strength;Decreased range of motion;Decreased activity tolerance;Decreased mobility;Decreased knowledge of use of DME;Decreased safety awareness;Decreased knowledge of precautions;Pain;Impaired sensation PT Treatment Interventions: DME instruction;Gait training;Stair training;Functional mobility training;Therapeutic activities;Therapeutic exercise;Neuromuscular re-education;Manual techniques;Modalities;Patient/family education   PT Goals Acute Rehab PT Goals PT Goal Formulation: With patient Time For Goal Achievement: 11/15/11 Potential to Achieve Goals: Good Pt will go Supine/Side to Sit: with supervision PT Goal: Supine/Side to Sit - Progress: Goal set today Pt will go Sit to Supine/Side: with supervision PT Goal: Sit to Supine/Side - Progress: Goal set today Pt will go Sit to Stand: with supervision PT Goal: Sit to Stand - Progress: Goal set today Pt will go Stand to Sit: with supervision PT Goal: Stand to Sit - Progress: Goal set today Pt will Transfer Bed to Chair/Chair to Bed: with supervision PT Transfer Goal: Bed to Chair/Chair to Bed - Progress: Goal set today Pt will Ambulate: 51 - 150 feet;with rolling walker;with supervision PT Goal: Ambulate - Progress: Goal set today Pt will Go Up / Down Stairs: 1-2 stairs;with min assist;with least restrictive assistive device PT Goal: Up/Down Stairs - Progress: Goal set today Pt will Perform Home Exercise Program: with supervision, verbal cues required/provided PT Goal: Perform Home Exercise Program - Progress: Goal set today  Visit Information  Last PT Received On: 11/08/11 Assistance Needed: +1    Subjective Data  Subjective: I was able to walk in no time with my last knee Patient Stated Goal: Walk more and use my exercise bike at  home.     Prior Functioning   Home Living Lives With: Spouse Available Help at Discharge: Family;Available 24 hours/day Type of Home: House Home Access: Stairs to enter [employer] of Steps: 2 Entrance Stairs-Rails: None Home Layout: One level Bathroom Shower/Tub: Forensic scientist: Standard Bathroom Accessibility: Yes How Accessible: Accessible via walker Home Adaptive Equipment: Walker - rolling;Bedside commode/3-in-1 Prior Function Level of Independence: Independent Able to Take Stairs?: Yes Driving: Yes Vocation: Retired Musician: No difficulties Dominant Hand: Right    Cognition  Overall Cognitive Status: Appears within functional limits for tasks assessed/performed Arousal/Alertness: Awake/alert Orientation Level: Oriented X4 / Intact Behavior During Session: WFL for tasks performed    Extremity/Trunk Assessment Right Upper Extremity Assessment RUE Sensation: History of peripheral neuropathy Left Upper Extremity Assessment LUE ROM/Strength/Tone: Within functional levels Right Lower Extremity Assessment RLE ROM/Strength/Tone: Within functional levels RLE Sensation: History of peripheral neuropathy Left Lower Extremity Assessment LLE ROM/Strength/Tone: Deficits;Due to pain LLE ROM/Strength/Tone Deficits: Pt able to perform SLR independently.  AAROM L Knee 8 degrees short of full extension to 80 degrees of flexion.  LLE Sensation: History of peripheral neuropathy LLE Coordination: WFL - gross motor Trunk Assessment Trunk Assessment: Normal   Balance Balance Balance Assessed: No  End of Session PT - End of Session Equipment Utilized During Treatment: Gait belt Activity Tolerance: Patient tolerated treatment well Patient left: in chair;with call bell/phone within reach Nurse Communication: Mobility status CPM Left Knee CPM Left Knee: Off  GP     Morgan Boyer 11/08/2011, 12:26 PM Morgan Boyer L. Jovanni Rash DPT [phone]

## 2011-11-08 NOTE — Progress Notes (Signed)
Subjective: Doing well.  Pain controlled. No complaints.   Objective: Vital signs in last 24 hours: Temp:  [97.3 F (36.3 C)-97.9 F (36.6 C)] 97.9 F (36.6 C) (08/08 0620) Pulse Rate:  [56-71] 71  (08/08 0620) Resp:  [11-28] 16  (08/08 0620) BP: (113-150)/(61-82) 121/63 mmHg (08/08 0620) SpO2:  [94 %-100 %] 100 % (08/08 0620)  Intake/Output from previous day: 08/07 0701 - 08/08 0700 In: 2325 [I.V.:2221; IV Piggyback:104] Out: 1500 [Urine:1350; Drains:150] Intake/Output this shift:     Basename 11/08/11 0647  HGB 10.4*    Basename 11/08/11 0647  WBC 8.8  RBC 3.48*  HCT 29.5*  PLT 152    Basename 11/08/11 0647  NA 141  K 3.8  CL 108  CO2 28  BUN 10  CREATININE 0.65  GLUCOSE 92  CALCIUM 8.2*    Basename 11/08/11 0647  LABPT --  INR 1.29    Exam:  Dressing c/d/i. Calf nt, nvi.     Assessment/Plan: D/c dilaudid and foley.  Start therapy.  Anticipate d/c home fri or sat.   Claudia Alvizo M 11/08/2011, 8:49 AM

## 2011-11-09 ENCOUNTER — Encounter (HOSPITAL_COMMUNITY): Payer: Self-pay | Admitting: General Practice

## 2011-11-09 LAB — CBC
HCT: 30.3 % — ABNORMAL LOW (ref 36.0–46.0)
MCH: 29.9 pg (ref 26.0–34.0)
MCV: 85.6 fL (ref 78.0–100.0)
Platelets: 152 10*3/uL (ref 150–400)
RBC: 3.54 MIL/uL — ABNORMAL LOW (ref 3.87–5.11)

## 2011-11-09 LAB — BASIC METABOLIC PANEL
BUN: 8 mg/dL (ref 6–23)
CO2: 30 mEq/L (ref 19–32)
Calcium: 8.2 mg/dL — ABNORMAL LOW (ref 8.4–10.5)
Chloride: 102 mEq/L (ref 96–112)
Creatinine, Ser: 0.56 mg/dL (ref 0.50–1.10)

## 2011-11-09 MED ORDER — HYDROCODONE-ACETAMINOPHEN 7.5-325 MG PO TABS: 1.0000 | ORAL_TABLET | Freq: Four times a day (QID) | ORAL | Status: AC | PRN

## 2011-11-09 MED ORDER — METHOCARBAMOL 500 MG PO TABS: 500.0000 mg | ORAL_TABLET | Freq: Four times a day (QID) | ORAL | Status: AC | PRN

## 2011-11-09 MED ORDER — ENOXAPARIN SODIUM 30 MG/0.3ML ~~LOC~~ SOLN: 30.0000 mg | Freq: Two times a day (BID) | SUBCUTANEOUS | Status: DC

## 2011-11-09 MED ORDER — WARFARIN SODIUM 5 MG PO TABS: 5.0000 mg | ORAL_TABLET | Freq: Every day | ORAL | Status: DC

## 2011-11-09 MED ORDER — WARFARIN SODIUM 6 MG PO TABS
6.0000 mg | ORAL_TABLET | Freq: Once | ORAL | Status: AC
  Administered 2011-11-09: 6 mg via ORAL
  Filled 2011-11-09: qty 1

## 2011-11-09 NOTE — Progress Notes (Signed)
Physical Therapy Treatment Patient Details Name: Morgan Boyer MRN: [id number] DOB: [birth year] Today's Date: 11/09/2011 Time: 1478-2956 PT Time Calculation (min): 26 min  PT Assessment / Plan / Recommendation Comments on Treatment Session  Pt may benefit from short term SNF placement.     Follow Up Recommendations  Skilled nursing facility;Supervision/Assistance - 24 hour    Barriers to Discharge        Equipment Recommendations  None recommended by PT    Recommendations for Other Services    Frequency 7X/week   Plan Discharge plan needs to be updated;Frequency remains appropriate    Precautions / Restrictions Precautions Precautions: Knee Precaution Booklet Issued: No Required Braces or Orthoses: Knee Immobilizer - Left Knee Immobilizer - Left: On except when in CPM Restrictions Weight Bearing Restrictions: Yes LLE Weight Bearing: Weight bearing as tolerated   Pertinent Vitals/Pain Pt reports 6/10 pain in knee.    Mobility  Bed Mobility Bed Mobility: Not assessed Transfers Transfers: Sit to Stand;Stand to Sit Sit to Stand: 4: Min assist Stand to Sit: 4: Min assist Details for Transfer Assistance: Assist to initiate standing secondary to R LE and Bilateral UE weakness  Ambulation/Gait Ambulation/Gait Assistance: 4: Min guard Ambulation Distance (Feet): 45 Feet Assistive device: Rolling walker Ambulation/Gait Assistance Details: Pt unable to tolerate full WB in L LE. Instructed pt in PWB in L LE and pt able to increase distance of ambulation. Pt instructed to increase gait speed (timed pt for 10 feet= 13 seconds.) Gait Pattern: Step-to pattern;Decreased step length - left Gait velocity: decreased Wheelchair Mobility Wheelchair Mobility: No    Exercises     PT Diagnosis:    PT Problem List:   PT Treatment Interventions:     PT Goals Acute Rehab PT Goals PT Goal Formulation: With patient Time For Goal Achievement: 11/15/11 Potential to Achieve Goals: Good Pt  will go Supine/Side to Sit: with supervision Pt will go Sit to Stand: with supervision PT Goal: Sit to Stand - Progress: Progressing toward goal Pt will go Stand to Sit: with supervision PT Goal: Stand to Sit - Progress: Progressing toward goal Pt will Transfer Bed to Chair/Chair to Bed: with supervision PT Transfer Goal: Bed to Chair/Chair to Bed - Progress: Progressing toward goal Pt will Ambulate: 51 - 150 feet;with rolling walker;with supervision PT Goal: Ambulate - Progress: Progressing toward goal Pt will Go Up / Down Stairs: 1-2 stairs;with min assist;with least restrictive assistive device  Visit Information  Last PT Received On: 11/09/11 Assistance Needed: +1    Subjective Data  Subjective: I dont think my husband can care for me.  Patient Stated Goal: Walk more and use my exercise bike at home.     Cognition  Overall Cognitive Status: Appears within functional limits for tasks assessed/performed Arousal/Alertness: Awake/alert Orientation Level: Appears intact for tasks assessed Behavior During Session: [facility] for tasks performed    Balance  Balance Balance Assessed: No  End of Session PT - End of Session Equipment Utilized During Treatment: Gait belt Activity Tolerance: Patient tolerated treatment well Patient left: in chair;with call bell/phone within reach;with family/visitor present Nurse Communication: Mobility status   GP     Morgan Boyer 11/09/2011, 5:49 PM Morgan Boyer DPT [phone]

## 2011-11-09 NOTE — Progress Notes (Signed)
PT PROGRESS NOTE:  11/09/2011  11/09/11 0930  PT Visit Information  Last PT Received On 11/09/11  Assistance Needed +2  PT Time Calculation  PT Start Time 0920  PT Stop Time 0954  PT Time Calculation (min) 34 min  Subjective Data  Subjective I feel groggy.  Precautions  Precautions Knee  Precaution Booklet Issued No  Required Braces or Orthoses Knee Immobilizer - Left  Knee Immobilizer - Left On except when in CPM  Restrictions  Weight Bearing Restrictions Yes  LLE Weight Bearing WBAT  Cognition  Overall Cognitive Status Appears within functional limits for tasks assessed/performed  Arousal/Alertness Awake/alert  Orientation Level Oriented X4 / Intact  Behavior During Session [facility] for tasks performed  Bed Mobility  Bed Mobility Supine to Sit;Sitting - Scoot to Edge of Bed  Supine to Sit 4: Min assist  Sitting - Scoot to [place] of Bed 4: Min assist  Sit to Supine Not Tested (comment)  Details for Bed Mobility Assistance Min asssist to manage L LE  Transfers  Transfers Sit to Stand;Stand to Sit  Sit to Stand 4: Min assist;With upper extremity assist;From chair/3-in-1  Stand to Sit 4: Min assist;With upper extremity assist;With armrests;To bed  Details for Transfer Assistance Assist to initiate standing secondary to R LE and Bilateral UE weakness   Ambulation/Gait  Ambulation/Gait Assistance 4: Min guard  Ambulation Distance (Feet) 12 Feet  Assistive device Rolling walker  Ambulation/Gait Assistance Details Pt unable to tolerate full WB in L LE. Instructed pt in PWB in L LE and pt able to increase distance of ambulation.  Pt instructed to increase gait speed (timed pt for 10 feet= 13 seconds.)  Gait Pattern Step-to pattern;Decreased step length - left  Gait velocity decreased  Stairs No  Wheelchair Mobility  Wheelchair Mobility No  Balance  Balance Assessed No  Exercises  Exercises Total Joint  Total Joint Exercises  Ankle Circles/Pumps Both;10 reps;Seated  Goniometric ROM  0-76 degrees AAROM L knee  Knee Flexion 5 reps;AAROM;Left;Seated  PT - End of Session  Equipment Utilized During Treatment Gait belt  Activity Tolerance Patient tolerated treatment well  Patient left in bed;with call bell/phone within reach;in CPM  Nurse Communication Mobility status  PT - Assessment/Plan  Comments on Treatment Session Pt stuggled with mobility. Pain in L LE and generalized weakness are primary limitting factors.  May need to consider SNF as it is unlikely pt's spouse could provide her the assistance she needs at this time.    PT Plan Discharge plan needs to be updated;Frequency remains appropriate  PT Frequency 7X/week  Follow Up Recommendations Skilled nursing facility;Supervision/Assistance - 24 hour  Equipment Recommended None recommended by PT  Acute Rehab PT Goals  PT Goal Formulation With patient  Time For Goal Achievement 11/15/11  Potential to Achieve Goals Good  Pt will go Supine/Side to Sit with supervision  PT Goal: Supine/Side to Sit - Progress Progressing toward goal  Pt will go Sit to Supine/Side with supervision  PT Goal: Sit to Supine/Side - Progress Progressing toward goal  Pt will go Sit to Stand with supervision  PT Goal: Sit to Stand - Progress Progressing toward goal  Pt will go Stand to Sit with supervision  PT Goal: Stand to Sit - Progress Progressing toward goal  Pt will Transfer Bed to Chair/Chair to Bed with supervision  PT Transfer Goal: Bed to Chair/Chair to Bed - Progress Progressing toward goal  Pt will Ambulate 51 - 150 feet;with rolling walker;with supervision  PT  Goal: Ambulate - Progress Progressing toward goal  PT General Charges  $$ ACUTE PT VISIT 1 Procedure  PT Treatments  $Gait Training 8-22 mins  $Therapeutic Activity 8-22 mins   Marcelis Wissner L. Kmari Brian DPT [phone]

## 2011-11-09 NOTE — Progress Notes (Signed)
Patient voided 250 ml on BSC.  PVR scan is 469. In and out cath performed by NT is 600 ml cl, yellow urine.  Patient tolerated well.

## 2011-11-09 NOTE — Discharge Summary (Signed)
NAMEJURNEY, OVERACKER               ACCOUNT NO.:  [id number]  MEDICAL RECORD NO.:  [id number]  LOCATION:  5N13C                        FACILITY:  [facility]  PHYSICIAN:  Loreta Ave, M.D. DATE OF BIRTH:  [birth year]  DATE OF ADMISSION:  11/07/2011 DATE OF DISCHARGE:                              DISCHARGE SUMMARY   FINAL DIAGNOSES: 1. 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 2. Hypertension. 3. Gastroesophageal reflux disease. 4. Hypercholesterolemia. 5. Peripheral neuropathy. 6. Diverticulitis.  HISTORY OF PRESENT ILLNESS:  A 78 year old white female with history of end-stage DJD left knee and chronic pain who presented to our office for preop evaluation for total knee replacement.  She had progressively worsening pain with failed response with conservative treatment. Significant decrease in her daily activities due to the ongoing complaint.  HOSPITAL COURSE:  On November 07, 2011, the patient was taken to the [facility] OR and a left total knee replacement procedure performed.  SURGEON:  Mckinley Jewel, MD  ASSISTANT:  Zonia Kief, PA-C  ANESTHESIA:  General with femoral nerve block.  BLOOD LOSS:  Minimal.  No specimens or cultures.  All counts correct.  Tourniquet time 59 minutes.  There were no surgical or anesthesia complications, and the patient was transferred to recovery in stable condition.  After arriving to the orthopedic unit, a pharmacy protocol of Coumadin and Lovenox started for DVT prophylaxis.  November 08, 2011, the patient doing well with good pain control.  Vital signs stable.  Afebrile.  Hemoglobin 10.4.  Hematocrit 29.5, sodium 141, potassium 3.8, chloride 108, CO2 28, BUN 10, creatinine 0.65, INR 1.29.  Dressing clean, dry, and intact.  Calf nontender.  Neurovascularly intact.  Skin warm and dry.  Discontinued Dilaudid and Foley.  Started PT.  On November 09, 2011, the patient states that she is moving slow  with therapy and after speaking with her husband, they now feel that a skilled nursing facility for rehab would be their best option at this point.  Hemoglobin 10.6, hematocrit 30.3. INR 1.27.  Knee wound looks good and staples intact.  No drainage or signs of infection.  Hemovac drain removed.  Start arranging discharge to skilled nursing facility for rehab.  Discharge when bed available.  DISCHARGE MEDICATIONS:  See detailed AVS.  CONDITION:  Stable.  DISPOSITION:  Transfer to a skilled nursing facility for rehab.  INSTRUCTIONS:  While at the facility, the patient will continue to work with PT and OT to improve ambulation and knee range of motion and strengthening.  Weight bear as tolerated with walker and can wean to a single prong cane as tolerated.  Okay to shower but no tub soaking.  Do not apply any creams or ointments to her incision.  Daily dressing changes with 4 x 4 gauze, then apply TED hose over this.  Needs to wear TED hose at least 2-4 weeks postop.  CPM 0-70 degrees 6-8 hours daily and increase by 10 degrees daily as tolerated.  Remain on Coumadin x3-4 weeks for postop DVT prophylaxis.  Discontinue Lovenox injections when Coumadin is therapeutic with INR 2-3.  Return office visit with Dr. Eulah Pont at 2 weeks postop  for recheck.  Call our office immediately if there are any questions or concerns.  Office [phone].     Genene Churn. Denton Meek.   ______________________________ Loreta Ave, M.D.    JMO/MEDQ  D:  11/09/2011  T:  11/09/2011  Job:  [id number]

## 2011-11-09 NOTE — Progress Notes (Signed)
ANTICOAGULATION CONSULT NOTE - Follow-Up Consult  Pharmacy Consult for Coumadin Indication: VTE prophylaxis  Allergies  Allergen Reactions  . Septra (Sulfamethoxazole-Tmp Ds) Hives   Labs:  Basename 11/09/11 0530 11/08/11 0647  HGB 10.6* 10.4*  HCT 30.3* 29.5*  PLT 152 152  APTT -- --  LABPROT 16.2* 16.3*  INR 1.27 1.29  HEPARINUNFRC -- --  CREATININE 0.56 0.65  CKTOTAL -- --  CKMB -- --  TROPONINI -- --    CrCl is unknown because there is no height on file for the current visit.  Assessment: 78 year old s/p TKA INR not increasing  No bleeding noted  Goal of Therapy:  INR 2-3 Monitor platelets by anticoagulation protocol: Yes   Plan:  1) Coumadin 6 mg po x 1 2) Daily PT / INR  Thank you. Okey Regal, PharmD [phone] 11/09/2011,9:14 AM

## 2011-11-09 NOTE — Evaluation (Signed)
Occupational Therapy Evaluation Patient Details Name: Morgan Boyer MRN: [id number] DOB: 27-Apr-1931 Today's Date: 11/09/2011 Time: 4098-1191 OT Time Calculation (min): 42 min  OT Assessment / Plan / Recommendation Clinical Impression  This 78 yo female making slow progress after LTKA. Will benefit from acute OT with follow up OT at SNF.    OT Assessment  Patient needs continued OT Services    Follow Up Recommendations  Skilled nursing facility    Barriers to Discharge Decreased caregiver support    Equipment Recommendations  Defer to next venue    Recommendations for Other Services    Frequency  Min 2X/week    Precautions / Restrictions Precautions Precautions: Knee Required Braces or Orthoses: Knee Immobilizer - Left Knee Immobilizer - Left: On except when in CPM Restrictions Weight Bearing Restrictions: Yes LLE Weight Bearing: Weight bearing as tolerated   Pertinent Vitals/Pain 10/10 LLE with weightbearing even post 1 hour since pain meds    ADL  Eating/Feeding: Simulated;Independent Where Assessed - Eating/Feeding: Chair Grooming: Simulated;Set up Where Assessed - Grooming: Unsupported sitting Upper Body Bathing: Simulated;Set up Where Assessed - Upper Body Bathing: Unsupported sitting Lower Body Bathing: Simulated;Maximal assistance Where Assessed - Lower Body Bathing: Supported sit to stand Upper Body Dressing: Simulated;Set up Where Assessed - Upper Body Dressing: Unsupported sitting Lower Body Dressing: Simulated;+1 Total assistance Where Assessed - Lower Body Dressing: Supported sit to stand Toilet Transfer: Moderate assistance;Performed Toilet Transfer Method: Sit to Barista: Materials engineer and Hygiene: Performed;Moderate assistance Where Assessed - Toileting Clothing Manipulation and Hygiene: Standing Equipment Used: Rolling walker;Knee Immobilizer Transfers/Ambulation Related to ADLs: Mod A, pt  with great difficulty moving RLE due to increased pain with WB'iing through LLE even with KI    OT Diagnosis: Generalized weakness;Acute pain  OT Problem List: Decreased strength;Decreased range of motion;Decreased activity tolerance;Impaired balance (sitting and/or standing);Decreased knowledge of use of DME or AE;Pain OT Treatment Interventions: Self-care/ADL training;DME and/or AE instruction;Balance training;Patient/family education   OT Goals Acute Rehab OT Goals OT Goal Formulation: With patient Time For Goal Achievement: 11/16/11 Potential to Achieve Goals: Good ADL Goals Pt Will Perform Grooming: with set-up;with supervision;Supported;Standing at sink (min A standing, 1 task) ADL Goal: Grooming - Progress: Goal set today Pt Will Perform Lower Body Bathing: with min assist;Sit to stand from chair;Sit to stand from bed;Supported ADL Goal: Lower Body Bathing - Progress: Goal set today Pt Will Perform Lower Body Dressing: with mod assist;Supported;Sit to stand from chair;Sitting, bed ADL Goal: Lower Body Dressing - Progress: Goal set today Pt Will Transfer to Toilet: with min assist;Ambulation;Comfort height toilet;Grab bars ADL Goal: Toilet Transfer - Progress: Goal set today Pt Will Perform Toileting - Clothing Manipulation: with mod assist;Standing ADL Goal: Toileting - Clothing Manipulation - Progress: Goal set today Pt Will Perform Toileting - Hygiene: with min assist;Sit to stand from 3-in-1/toilet ADL Goal: Toileting - Hygiene - Progress: Goal set today Miscellaneous OT Goals Miscellaneous OT Goal #1: Pt will be min A in/OOB for BADLs OT Goal: Miscellaneous Goal #1 - Progress: Goal set today  Visit Information  Last OT Received On: 11/09/11 Assistance Needed: +1 (+1 safety)    Subjective Data  Patient Stated Goal: It may be better for me to go to a rehab place.   Prior Functioning  Vision/Perception  Home Living Lives With: Spouse Available Help at Discharge:  Family;Available 24 hours/day Type of Home: House Home Access: Stairs to enter [employer] of Steps: 2 Entrance Stairs-Rails: None Home  Layout: One level Bathroom Shower/Tub: Forensic scientist: Standard Bathroom Accessibility: Yes How Accessible: Accessible via walker Home Adaptive Equipment: Walker - rolling;Bedside commode/3-in-1 Prior Function Level of Independence: Independent Able to Take Stairs?: Yes Driving: Yes Vocation: Retired Musician: No difficulties Dominant Hand: Right      Cognition  Overall Cognitive Status: Appears within functional limits for tasks assessed/performed Arousal/Alertness: Awake/alert Orientation Level: Appears intact for tasks assessed Behavior During Session: [facility] for tasks performed    Extremity/Trunk Assessment Right Upper Extremity Assessment RUE ROM/Strength/Tone:  (grossly 3/5) Left Upper Extremity Assessment LUE ROM/Strength/Tone:  (grossly 3/5)   Mobility Transfers Transfers: Sit to Stand;Stand to Sit Sit to Stand: 2: Max assist;With upper extremity assist;With armrests;From chair/3-in-1 Stand to Sit: 3: Mod assist;With upper extremity assist;With armrests;To chair/3-in-1   Exercise    Balance    End of Session OT - End of Session Equipment Utilized During Treatment: Gait belt;Left knee immobilizer (RW) Activity Tolerance: Patient limited by pain Patient left: in chair;with call bell/phone within reach;with family/visitor present (husband) Nurse Communication:  (Pt needs SNF, not HHOT)       Evette Georges [phone] 11/09/2011, 5:22 PM

## 2011-11-09 NOTE — Progress Notes (Signed)
Patient ID: Morgan Boyer, female   DOB: 1932/02/07, 78 y.o.   MRN: [id number]  PRIORITY SUMMARY #[id number]

## 2011-11-09 NOTE — Progress Notes (Signed)
Subjective: Doing ok.  Pain controlled   Objective: Vital signs in last 24 hours: Temp:  [98.5 F (36.9 C)-98.7 F (37.1 C)] 98.7 F (37.1 C) (08/09 0702) Pulse Rate:  [77-78] 77  (08/09 0702) Resp:  [18-22] 18  (08/09 1200) BP: (133-135)/(64-65) 135/65 mmHg (08/09 0702) SpO2:  [98 %-99 %] 99 % (08/09 1200)  Intake/Output from previous day: 08/08 0701 - 08/09 0700 In: 720 [P.O.:720] Out: 1430 [Urine:1400; Drains:30] Intake/Output this shift: Total I/O In: 480 [P.O.:480] Out: 1150 [Urine:1100; Drains:50]   Basename 11/09/11 0530 11/08/11 0647  HGB 10.6* 10.4*    Basename 11/09/11 0530 11/08/11 0647  WBC 7.7 8.8  RBC 3.54* 3.48*  HCT 30.3* 29.5*  PLT 152 152    Basename 11/09/11 0530 11/08/11 0647  NA 138 141  K 3.5 3.8  CL 102 108  CO2 30 28  BUN 8 10  CREATININE 0.56 0.65  GLUCOSE 133* 92  CALCIUM 8.2* 8.2*    Basename 11/09/11 0530 11/08/11 0647  LABPT -- --  INR 1.27 1.29    Exam:Wound looks good.  Staples intact.  No drainage or signs of infection.  Calf nontender. Drain removed.  Assessment/Plan: Possible d/c home today or Saturday.   Saline lock iv.   Duard Spiewak M 11/09/2011, 2:13 PM

## 2011-11-10 DIAGNOSIS — F329 Major depressive disorder, single episode, unspecified: Secondary | ICD-10-CM | POA: Diagnosis not present

## 2011-11-10 DIAGNOSIS — G609 Hereditary and idiopathic neuropathy, unspecified: Secondary | ICD-10-CM | POA: Diagnosis not present

## 2011-11-10 DIAGNOSIS — Z5189 Encounter for other specified aftercare: Secondary | ICD-10-CM | POA: Diagnosis not present

## 2011-11-10 DIAGNOSIS — E785 Hyperlipidemia, unspecified: Secondary | ICD-10-CM | POA: Diagnosis not present

## 2011-11-10 DIAGNOSIS — M25569 Pain in unspecified knee: Secondary | ICD-10-CM | POA: Diagnosis not present

## 2011-11-10 DIAGNOSIS — Z96659 Presence of unspecified artificial knee joint: Secondary | ICD-10-CM | POA: Diagnosis not present

## 2011-11-10 DIAGNOSIS — G608 Other hereditary and idiopathic neuropathies: Secondary | ICD-10-CM | POA: Diagnosis not present

## 2011-11-10 DIAGNOSIS — M1710 Unilateral primary osteoarthritis, unspecified knee: Secondary | ICD-10-CM | POA: Diagnosis not present

## 2011-11-10 DIAGNOSIS — IMO0002 Reserved for concepts with insufficient information to code with codable children: Secondary | ICD-10-CM | POA: Diagnosis not present

## 2011-11-10 DIAGNOSIS — E876 Hypokalemia: Secondary | ICD-10-CM | POA: Diagnosis not present

## 2011-11-10 DIAGNOSIS — I10 Essential (primary) hypertension: Secondary | ICD-10-CM | POA: Diagnosis not present

## 2011-11-10 DIAGNOSIS — M1990 Unspecified osteoarthritis, unspecified site: Secondary | ICD-10-CM | POA: Diagnosis not present

## 2011-11-10 DIAGNOSIS — K5900 Constipation, unspecified: Secondary | ICD-10-CM | POA: Diagnosis not present

## 2011-11-10 LAB — CBC
HCT: 32.7 % — ABNORMAL LOW (ref 36.0–46.0)
Hemoglobin: 11.3 g/dL — ABNORMAL LOW (ref 12.0–15.0)
MCH: 29.8 pg (ref 26.0–34.0)
MCHC: 34.6 g/dL (ref 30.0–36.0)
MCV: 86.3 fL (ref 78.0–100.0)
RBC: 3.79 MIL/uL — ABNORMAL LOW (ref 3.87–5.11)

## 2011-11-10 LAB — BASIC METABOLIC PANEL
BUN: 9 mg/dL (ref 6–23)
CO2: 31 mEq/L (ref 19–32)
Calcium: 8.4 mg/dL (ref 8.4–10.5)
Creatinine, Ser: 0.61 mg/dL (ref 0.50–1.10)
GFR calc non Af Amer: 85 mL/min — ABNORMAL LOW (ref >90)
Glucose, Bld: 97 mg/dL (ref 70–99)
Sodium: 139 mEq/L (ref 135–145)

## 2011-11-10 LAB — PROTIME-INR: Prothrombin Time: 18.4 seconds — ABNORMAL HIGH (ref 11.6–15.2)

## 2011-11-10 MED ORDER — WARFARIN SODIUM 5 MG PO TABS
5.0000 mg | ORAL_TABLET | Freq: Once | ORAL | Status: AC
  Administered 2011-11-10: 5 mg via ORAL
  Filled 2011-11-10: qty 1

## 2011-11-10 NOTE — Progress Notes (Signed)
PT Cancellation Note  Treatment cancelled today due to patient's refusal to participate. Patient reports increased pain and requests pain meds.  Will return as time allows.  Morgan Boyer 11/10/2011, 2:44 PM [phone]

## 2011-11-10 NOTE — Progress Notes (Signed)
Subjective: 3 Days Post-Op Procedure(s) (LRB): TOTAL KNEE ARTHROPLASTY (Left) Patient reports pain as 7 on 0-10 scale.    Objective: Vital signs in last 24 hours: Temp:  [98.5 F (36.9 C)-98.9 F (37.2 C)] 98.7 F (37.1 C) (08/10 0642) Pulse Rate:  [75-77] 75  (08/10 0642) Resp:  [18-20] 18  (08/10 0642) BP: (125-132)/(53-62) 132/62 mmHg (08/10 0642) SpO2:  [98 %-99 %] 98 % (08/10 0642)  Intake/Output from previous day: 08/09 0701 - 08/10 0700 In: 720 [P.O.:720] Out: 1150 [Urine:1100; Drains:50] Intake/Output this shift:     Basename 11/10/11 0620 11/09/11 0530 11/08/11 0647  HGB 11.3* 10.6* 10.4*    Basename 11/10/11 0620 11/09/11 0530  WBC 7.6 7.7  RBC 3.79* 3.54*  HCT 32.7* 30.3*  PLT 152 152    Basename 11/10/11 0620 11/09/11 0530  NA 139 138  K 3.5 3.5  CL 100 102  CO2 31 30  BUN 9 8  CREATININE 0.61 0.56  GLUCOSE 97 133*  CALCIUM 8.4 8.2*    Basename 11/10/11 0620 11/09/11 0530  LABPT -- --  INR 1.50* 1.27    ABD soft Neurovascular intact Incision: no drainage  Assessment/Plan: 3 Days Post-Op Procedure(s) (LRB): TOTAL KNEE ARTHROPLASTY (Left) Discharge to SNF  ROBERTS,JANE B 11/10/2011, 10:13 AM

## 2011-11-10 NOTE — Progress Notes (Signed)
ANTICOAGULATION CONSULT NOTE - Follow-Up Consult  Pharmacy Consult for Coumadin Indication: VTE prophylaxis  Allergies  Allergen Reactions  . Septra (Sulfamethoxazole-Tmp Ds) Hives   Labs:  Basename 11/10/11 0620 11/09/11 0530 11/08/11 0647  HGB 11.3* 10.6* --  HCT 32.7* 30.3* 29.5*  PLT 152 152 152  APTT -- -- --  LABPROT 18.4* 16.2* 16.3*  INR 1.50* 1.27 1.29  HEPARINUNFRC -- -- --  CREATININE 0.61 0.56 0.65  CKTOTAL -- -- --  CKMB -- -- --  TROPONINI -- -- --    CrCl is unknown because there is no height on file for the current visit.  Assessment: 78 year old s/p TKA INR increasing but still subtherapeutic with an INR of 1.5 today. CBC stable. Pt planning to be discharged soon. Will give 1 more dose of Coumadin and plan for pt to follow up as outpatient   Goal of Therapy:  INR 2-3 Monitor platelets by anticoagulation protocol: Yes   Plan:  1) Coumadin 5mg  PO x 1 dose 2) Continue Lovenox as outpatient until INR > 1.8 3) F/u INR as outpatient   Franchot Erichsen, Pharm.D. Clinical Pharmacist   Pager: [phone] Phone: [phone] 11/10/2011 11:46 AM

## 2011-11-10 NOTE — Progress Notes (Signed)
Pt d/c'ed to [facility] via [facility] in stable condition. Son present at time of transfer. All belongings sent with pt. Coumadin dose given prior to transfer. Reports called to facility

## 2011-11-10 NOTE — Progress Notes (Signed)
Patient for d/c today to SNF bed at [facility]. Patient agreeable to this plan- confirmed plan with SNF Rep- will plan transfer via EMS. Reece Levy, MSW, LCSWA Weekend coverage [phone]

## 2011-11-11 NOTE — Clinical Social Work Psychosocial (Addendum)
    Clinical Social Work Department BRIEF PSYCHOSOCIAL ASSESSMENT 11/11/2011  Patient:  Morgan Boyer, Morgan Boyer     Account Number:  [id number]     Admit date:  11/07/2011  Clinical Social Worker:  Burnard Hawthorne  Date/Time:  11/09/2011 04:00 PM  Referred by:  Physician  Date Referred:  11/09/2011 Referred for  SNF Placement   Other Referral:   Interview type:  Other - See comment Other interview type:   Met with patient and her sonCasimiro Boyer    PSYCHOSOCIAL DATA Living Status:  HUSBAND Admitted from facility:   Level of care:   Primary support name:  Morgan Boyer [phone] Primary support relationship to patient:  SPOUSE Degree of support available:   Strong support-  Son Morgan Boyer is also very supportive    CURRENT CONCERNS Current Concerns  Post-Acute Placement   Other Concerns:    SOCIAL WORK ASSESSMENT / PLAN Patient was scheduled to go home today but at the time of d/c decided to go to SNF.  MD requested CSW to assist with this. Patient is medicaly ready for d/c per MD.  Clovis Cao completed and faxed to [facility] which is patient's preference.  Spoke to [employer]- Admissions at [facility]- cannot accept patient until Saturday due to lateness of the day. Notified patient, sonCasimiro Boyer and Morgan Boyer.  Will   Assessment/plan status:  Other - See comment Other assessment/ plan:   SNF Placement   Information/referral to community resources:   Discussed SNF bed search and aftercare needs when d/c'd from SNF    PATIENT'S/FAMILY'S RESPONSE TO PLAN OF CARE: Patient and son are appreciative of d/c plan to [facility]. Son will go to facility in the a.m. to sign admit papers.  Weekend CSW to finalize d/c to SNF.

## 2011-11-11 NOTE — Clinical Social Work Placement (Addendum)
    Clinical Social Work Department CLINICAL SOCIAL WORK PLACEMENT NOTE 11/11/2011  Patient:  Morgan Boyer, Morgan Boyer  Account Number:  [id number] Admit date:  11/07/2011  Clinical Social Worker:  Lupita Leash Liann Spaeth, BSW  Date/time:  11/09/2011 04:30 PM  Clinical Social Work is seeking post-discharge placement for this patient at the following level of care:   SKILLED NURSING   (*CSW will update this form in Epic as items are completed)     Patient/family provided with [facility] Department of Clinical Social Work's list of facilities offering this level of care within the geographic area requested by the patient (or if unable, by the patient's family).    Patient/family informed of their freedom to choose among providers that offer the needed level of care, that participate in Medicare, Medicaid or managed care program needed by the patient, have an available bed and are willing to accept the patient.    Patient/family informed of [facility]' ownership interest in [facility], as well as of the fact that they are under no obligation to receive care at this facility.  PASARR submitted to EDS on 11/09/2011 PASARR number received from EDS on 11/09/2011  FL2 transmitted to all facilities in geographic area requested by pt/family on  11/09/2011 FL2 transmitted to all facilities within larger geographic area on   Patient informed that his/her managed care company has contracts with or will negotiate with  certain facilities, including the following:   SNF list deferred- patient wants [facility] and bed is available.     Patient/family informed of bed offers received:  11/09/2011 Patient chooses bed at [facility] Physician recommends and patient chooses bed at    Patient to be transferred to [facility] on  11/10/11 Patient to be transferred to facility by ambulance  The following physician request were entered in Epic:   Additional Comments: DC completed by Reece Levy,  LCSWA for d/c to SNF.    Lorri Frederick. [place]  [phone]

## 2011-11-13 DIAGNOSIS — G608 Other hereditary and idiopathic neuropathies: Secondary | ICD-10-CM | POA: Diagnosis not present

## 2011-11-13 DIAGNOSIS — E785 Hyperlipidemia, unspecified: Secondary | ICD-10-CM | POA: Diagnosis not present

## 2011-11-13 DIAGNOSIS — M1710 Unilateral primary osteoarthritis, unspecified knee: Secondary | ICD-10-CM | POA: Diagnosis not present

## 2011-11-13 DIAGNOSIS — I10 Essential (primary) hypertension: Secondary | ICD-10-CM | POA: Diagnosis not present

## 2011-11-14 DIAGNOSIS — I10 Essential (primary) hypertension: Secondary | ICD-10-CM | POA: Diagnosis not present

## 2011-11-14 DIAGNOSIS — G609 Hereditary and idiopathic neuropathy, unspecified: Secondary | ICD-10-CM | POA: Diagnosis not present

## 2011-11-14 DIAGNOSIS — E876 Hypokalemia: Secondary | ICD-10-CM | POA: Diagnosis not present

## 2011-11-19 DIAGNOSIS — E785 Hyperlipidemia, unspecified: Secondary | ICD-10-CM | POA: Diagnosis not present

## 2011-11-19 DIAGNOSIS — I10 Essential (primary) hypertension: Secondary | ICD-10-CM | POA: Diagnosis not present

## 2011-11-19 DIAGNOSIS — G608 Other hereditary and idiopathic neuropathies: Secondary | ICD-10-CM | POA: Diagnosis not present

## 2011-11-19 DIAGNOSIS — M1710 Unilateral primary osteoarthritis, unspecified knee: Secondary | ICD-10-CM | POA: Diagnosis not present

## 2011-11-21 DIAGNOSIS — Z5181 Encounter for therapeutic drug level monitoring: Secondary | ICD-10-CM | POA: Diagnosis not present

## 2011-11-21 DIAGNOSIS — Z7901 Long term (current) use of anticoagulants: Secondary | ICD-10-CM | POA: Diagnosis not present

## 2011-11-21 DIAGNOSIS — Z96659 Presence of unspecified artificial knee joint: Secondary | ICD-10-CM | POA: Diagnosis not present

## 2011-11-21 DIAGNOSIS — Z471 Aftercare following joint replacement surgery: Secondary | ICD-10-CM | POA: Diagnosis not present

## 2011-11-21 DIAGNOSIS — Z4801 Encounter for change or removal of surgical wound dressing: Secondary | ICD-10-CM | POA: Diagnosis not present

## 2011-11-23 DIAGNOSIS — Z4801 Encounter for change or removal of surgical wound dressing: Secondary | ICD-10-CM | POA: Diagnosis not present

## 2011-11-23 DIAGNOSIS — Z96659 Presence of unspecified artificial knee joint: Secondary | ICD-10-CM | POA: Diagnosis not present

## 2011-11-23 DIAGNOSIS — Z7901 Long term (current) use of anticoagulants: Secondary | ICD-10-CM | POA: Diagnosis not present

## 2011-11-23 DIAGNOSIS — Z5181 Encounter for therapeutic drug level monitoring: Secondary | ICD-10-CM | POA: Diagnosis not present

## 2011-11-23 DIAGNOSIS — Z471 Aftercare following joint replacement surgery: Secondary | ICD-10-CM | POA: Diagnosis not present

## 2011-11-26 DIAGNOSIS — Z471 Aftercare following joint replacement surgery: Secondary | ICD-10-CM | POA: Diagnosis not present

## 2011-11-26 DIAGNOSIS — Z7901 Long term (current) use of anticoagulants: Secondary | ICD-10-CM | POA: Diagnosis not present

## 2011-11-26 DIAGNOSIS — Z5181 Encounter for therapeutic drug level monitoring: Secondary | ICD-10-CM | POA: Diagnosis not present

## 2011-11-26 DIAGNOSIS — Z4801 Encounter for change or removal of surgical wound dressing: Secondary | ICD-10-CM | POA: Diagnosis not present

## 2011-11-26 DIAGNOSIS — Z96659 Presence of unspecified artificial knee joint: Secondary | ICD-10-CM | POA: Diagnosis not present

## 2011-11-27 DIAGNOSIS — Z96659 Presence of unspecified artificial knee joint: Secondary | ICD-10-CM | POA: Diagnosis not present

## 2011-11-27 DIAGNOSIS — Z7901 Long term (current) use of anticoagulants: Secondary | ICD-10-CM | POA: Diagnosis not present

## 2011-11-27 DIAGNOSIS — Z5181 Encounter for therapeutic drug level monitoring: Secondary | ICD-10-CM | POA: Diagnosis not present

## 2011-11-27 DIAGNOSIS — Z4801 Encounter for change or removal of surgical wound dressing: Secondary | ICD-10-CM | POA: Diagnosis not present

## 2011-11-27 DIAGNOSIS — Z471 Aftercare following joint replacement surgery: Secondary | ICD-10-CM | POA: Diagnosis not present

## 2011-11-28 DIAGNOSIS — Z5181 Encounter for therapeutic drug level monitoring: Secondary | ICD-10-CM | POA: Diagnosis not present

## 2011-11-28 DIAGNOSIS — Z7901 Long term (current) use of anticoagulants: Secondary | ICD-10-CM | POA: Diagnosis not present

## 2011-11-28 DIAGNOSIS — Z96659 Presence of unspecified artificial knee joint: Secondary | ICD-10-CM | POA: Diagnosis not present

## 2011-11-28 DIAGNOSIS — Z4801 Encounter for change or removal of surgical wound dressing: Secondary | ICD-10-CM | POA: Diagnosis not present

## 2011-11-28 DIAGNOSIS — Z471 Aftercare following joint replacement surgery: Secondary | ICD-10-CM | POA: Diagnosis not present

## 2011-11-29 DIAGNOSIS — Z96659 Presence of unspecified artificial knee joint: Secondary | ICD-10-CM | POA: Diagnosis not present

## 2011-11-29 DIAGNOSIS — Z471 Aftercare following joint replacement surgery: Secondary | ICD-10-CM | POA: Diagnosis not present

## 2011-11-29 DIAGNOSIS — Z5181 Encounter for therapeutic drug level monitoring: Secondary | ICD-10-CM | POA: Diagnosis not present

## 2011-11-29 DIAGNOSIS — Z7901 Long term (current) use of anticoagulants: Secondary | ICD-10-CM | POA: Diagnosis not present

## 2011-11-29 DIAGNOSIS — Z4801 Encounter for change or removal of surgical wound dressing: Secondary | ICD-10-CM | POA: Diagnosis not present

## 2011-11-30 DIAGNOSIS — Z7901 Long term (current) use of anticoagulants: Secondary | ICD-10-CM | POA: Diagnosis not present

## 2011-11-30 DIAGNOSIS — Z4801 Encounter for change or removal of surgical wound dressing: Secondary | ICD-10-CM | POA: Diagnosis not present

## 2011-11-30 DIAGNOSIS — Z5181 Encounter for therapeutic drug level monitoring: Secondary | ICD-10-CM | POA: Diagnosis not present

## 2011-11-30 DIAGNOSIS — Z471 Aftercare following joint replacement surgery: Secondary | ICD-10-CM | POA: Diagnosis not present

## 2011-11-30 DIAGNOSIS — Z96659 Presence of unspecified artificial knee joint: Secondary | ICD-10-CM | POA: Diagnosis not present

## 2011-12-04 DIAGNOSIS — Z7901 Long term (current) use of anticoagulants: Secondary | ICD-10-CM | POA: Diagnosis not present

## 2011-12-04 DIAGNOSIS — Z96659 Presence of unspecified artificial knee joint: Secondary | ICD-10-CM | POA: Diagnosis not present

## 2011-12-04 DIAGNOSIS — Z4801 Encounter for change or removal of surgical wound dressing: Secondary | ICD-10-CM | POA: Diagnosis not present

## 2011-12-04 DIAGNOSIS — Z471 Aftercare following joint replacement surgery: Secondary | ICD-10-CM | POA: Diagnosis not present

## 2011-12-04 DIAGNOSIS — Z5181 Encounter for therapeutic drug level monitoring: Secondary | ICD-10-CM | POA: Diagnosis not present

## 2011-12-05 DIAGNOSIS — Z96659 Presence of unspecified artificial knee joint: Secondary | ICD-10-CM | POA: Diagnosis not present

## 2011-12-05 DIAGNOSIS — Z7901 Long term (current) use of anticoagulants: Secondary | ICD-10-CM | POA: Diagnosis not present

## 2011-12-05 DIAGNOSIS — Z4801 Encounter for change or removal of surgical wound dressing: Secondary | ICD-10-CM | POA: Diagnosis not present

## 2011-12-05 DIAGNOSIS — Z471 Aftercare following joint replacement surgery: Secondary | ICD-10-CM | POA: Diagnosis not present

## 2011-12-05 DIAGNOSIS — Z5181 Encounter for therapeutic drug level monitoring: Secondary | ICD-10-CM | POA: Diagnosis not present

## 2011-12-06 DIAGNOSIS — Z471 Aftercare following joint replacement surgery: Secondary | ICD-10-CM | POA: Diagnosis not present

## 2011-12-06 DIAGNOSIS — Z96659 Presence of unspecified artificial knee joint: Secondary | ICD-10-CM | POA: Diagnosis not present

## 2011-12-06 DIAGNOSIS — Z7901 Long term (current) use of anticoagulants: Secondary | ICD-10-CM | POA: Diagnosis not present

## 2011-12-06 DIAGNOSIS — Z4801 Encounter for change or removal of surgical wound dressing: Secondary | ICD-10-CM | POA: Diagnosis not present

## 2011-12-06 DIAGNOSIS — Z5181 Encounter for therapeutic drug level monitoring: Secondary | ICD-10-CM | POA: Diagnosis not present

## 2011-12-07 DIAGNOSIS — Z5181 Encounter for therapeutic drug level monitoring: Secondary | ICD-10-CM | POA: Diagnosis not present

## 2011-12-07 DIAGNOSIS — Z96659 Presence of unspecified artificial knee joint: Secondary | ICD-10-CM | POA: Diagnosis not present

## 2011-12-07 DIAGNOSIS — Z4801 Encounter for change or removal of surgical wound dressing: Secondary | ICD-10-CM | POA: Diagnosis not present

## 2011-12-07 DIAGNOSIS — Z7901 Long term (current) use of anticoagulants: Secondary | ICD-10-CM | POA: Diagnosis not present

## 2011-12-07 DIAGNOSIS — Z471 Aftercare following joint replacement surgery: Secondary | ICD-10-CM | POA: Diagnosis not present

## 2011-12-10 DIAGNOSIS — Z96659 Presence of unspecified artificial knee joint: Secondary | ICD-10-CM | POA: Diagnosis not present

## 2011-12-10 DIAGNOSIS — Z4801 Encounter for change or removal of surgical wound dressing: Secondary | ICD-10-CM | POA: Diagnosis not present

## 2011-12-10 DIAGNOSIS — Z7901 Long term (current) use of anticoagulants: Secondary | ICD-10-CM | POA: Diagnosis not present

## 2011-12-10 DIAGNOSIS — Z5181 Encounter for therapeutic drug level monitoring: Secondary | ICD-10-CM | POA: Diagnosis not present

## 2011-12-10 DIAGNOSIS — Z471 Aftercare following joint replacement surgery: Secondary | ICD-10-CM | POA: Diagnosis not present

## 2011-12-12 DIAGNOSIS — Z5181 Encounter for therapeutic drug level monitoring: Secondary | ICD-10-CM | POA: Diagnosis not present

## 2011-12-12 DIAGNOSIS — Z4801 Encounter for change or removal of surgical wound dressing: Secondary | ICD-10-CM | POA: Diagnosis not present

## 2011-12-12 DIAGNOSIS — Z96659 Presence of unspecified artificial knee joint: Secondary | ICD-10-CM | POA: Diagnosis not present

## 2011-12-12 DIAGNOSIS — Z7901 Long term (current) use of anticoagulants: Secondary | ICD-10-CM | POA: Diagnosis not present

## 2011-12-12 DIAGNOSIS — Z471 Aftercare following joint replacement surgery: Secondary | ICD-10-CM | POA: Diagnosis not present

## 2011-12-14 DIAGNOSIS — Z471 Aftercare following joint replacement surgery: Secondary | ICD-10-CM | POA: Diagnosis not present

## 2011-12-14 DIAGNOSIS — Z4801 Encounter for change or removal of surgical wound dressing: Secondary | ICD-10-CM | POA: Diagnosis not present

## 2011-12-14 DIAGNOSIS — Z7901 Long term (current) use of anticoagulants: Secondary | ICD-10-CM | POA: Diagnosis not present

## 2011-12-14 DIAGNOSIS — Z96659 Presence of unspecified artificial knee joint: Secondary | ICD-10-CM | POA: Diagnosis not present

## 2011-12-14 DIAGNOSIS — Z5181 Encounter for therapeutic drug level monitoring: Secondary | ICD-10-CM | POA: Diagnosis not present

## 2011-12-25 DIAGNOSIS — Z471 Aftercare following joint replacement surgery: Secondary | ICD-10-CM | POA: Diagnosis not present

## 2011-12-25 DIAGNOSIS — Z96659 Presence of unspecified artificial knee joint: Secondary | ICD-10-CM | POA: Diagnosis not present

## 2012-01-15 DIAGNOSIS — G63 Polyneuropathy in diseases classified elsewhere: Secondary | ICD-10-CM | POA: Diagnosis not present

## 2012-01-15 DIAGNOSIS — Z23 Encounter for immunization: Secondary | ICD-10-CM | POA: Diagnosis not present

## 2012-02-22 DIAGNOSIS — Z1231 Encounter for screening mammogram for malignant neoplasm of breast: Secondary | ICD-10-CM | POA: Diagnosis not present

## 2012-05-05 DIAGNOSIS — I10 Essential (primary) hypertension: Secondary | ICD-10-CM | POA: Diagnosis not present

## 2012-10-21 DIAGNOSIS — M25569 Pain in unspecified knee: Secondary | ICD-10-CM | POA: Diagnosis not present

## 2012-10-21 DIAGNOSIS — M25579 Pain in unspecified ankle and joints of unspecified foot: Secondary | ICD-10-CM | POA: Diagnosis not present

## 2012-10-28 DIAGNOSIS — Z961 Presence of intraocular lens: Secondary | ICD-10-CM | POA: Diagnosis not present

## 2012-10-28 DIAGNOSIS — H02429 Myogenic ptosis of unspecified eyelid: Secondary | ICD-10-CM | POA: Diagnosis not present

## 2012-10-28 DIAGNOSIS — H04129 Dry eye syndrome of unspecified lacrimal gland: Secondary | ICD-10-CM | POA: Diagnosis not present

## 2012-10-30 DIAGNOSIS — I10 Essential (primary) hypertension: Secondary | ICD-10-CM | POA: Diagnosis not present

## 2012-10-30 DIAGNOSIS — E785 Hyperlipidemia, unspecified: Secondary | ICD-10-CM | POA: Diagnosis not present

## 2012-10-30 DIAGNOSIS — Z0000 Encounter for general adult medical examination without abnormal findings: Secondary | ICD-10-CM | POA: Diagnosis not present

## 2012-10-30 DIAGNOSIS — Z1331 Encounter for screening for depression: Secondary | ICD-10-CM | POA: Diagnosis not present

## 2012-12-17 DIAGNOSIS — Z23 Encounter for immunization: Secondary | ICD-10-CM | POA: Diagnosis not present

## 2012-12-30 ENCOUNTER — Encounter: Payer: Self-pay | Admitting: Neurology

## 2013-01-06 ENCOUNTER — Other Ambulatory Visit: Payer: Self-pay | Admitting: Neurology

## 2013-01-14 ENCOUNTER — Encounter: Payer: Self-pay | Admitting: Neurology

## 2013-01-14 ENCOUNTER — Ambulatory Visit (INDEPENDENT_AMBULATORY_CARE_PROVIDER_SITE_OTHER): Payer: Medicare Other | Admitting: Neurology

## 2013-01-14 VITALS — BP 122/70 | HR 71 | Temp 97.5°F | Wt 146.0 lb

## 2013-01-14 DIAGNOSIS — G63 Polyneuropathy in diseases classified elsewhere: Secondary | ICD-10-CM | POA: Diagnosis not present

## 2013-01-14 DIAGNOSIS — R413 Other amnesia: Secondary | ICD-10-CM

## 2013-01-14 HISTORY — DX: Other amnesia: R41.3

## 2013-01-14 HISTORY — DX: Polyneuropathy in diseases classified elsewhere: G63

## 2013-01-14 MED ORDER — GABAPENTIN 600 MG PO TABS: 600.0000 mg | ORAL_TABLET | Freq: Every day | ORAL | Status: DC

## 2013-01-14 MED ORDER — BACLOFEN 10 MG PO TABS: 5.0000 mg | ORAL_TABLET | Freq: Every day | ORAL | Status: DC

## 2013-01-14 NOTE — Progress Notes (Signed)
Reason for visit: Peripheral neuropathy  Morgan Boyer is an 79 y.o. female  History of present illness:  Morgan Boyer is a 79 year old right-handed white female with a history of a peripheral neuropathy. The patient is on gabapentin taking 600 milligrams at night for the neuropathy, and she may take 100 mg capsules during the day if needed. The patient takes baclofen, 5 mg at night for muscle cramps of the feet. The patient overall believes that she is doing fairly well. The patient has some mild gait instability, but she does not require a cane for ambulation, and she has not had any falls. The patient has had some mild issues with memory with remembering names and with word finding problems. The patient has noted this for one or 2 years. The patient has not given up any activities of daily living such as driving, doing the finances, cooking, or keeping up with appointments or medications secondary to memory. The patient returns for an evaluation.  Past Medical History  Diagnosis Date  . Hypertension   . H/O hiatal hernia   . Neuromuscular disorder     peripheral neuropathy  . Arthritis   . Peripheral neuropathy   . Dyslipidemia   . History of shingles     left throat 07/2007  . Polyneuropathy in other diseases classified elsewhere 01/14/2013  . Memory changes 01/14/2013  . Abnormality of gait     Past Surgical History  Procedure Laterality Date  . Tonsillectomy    . Colon surgery      2001 diverticulitis with infection ..drained surgically .  Marland Kitchen Tubal ligation    . Abdominal hysterectomy    . Joint replacement      right  . Appendectomy    . Eye surgery      cat ext bil   . Knee arthroscopy  11/08/2011  . Total knee arthroplasty  11/07/2011    Procedure: TOTAL KNEE ARTHROPLASTY;  Surgeon: Loreta Ave, MD;  Location: [facility] OR;  Service: Orthopedics;  Laterality: Left;    Family History  Problem Relation Age of Onset  . Congestive Heart Failure Mother   . Stroke Father    . Heart attack Brother   . Diabetes Brother     Social history:  reports that she has never smoked. She has never used smokeless tobacco. She reports that she does not drink alcohol or use illicit drugs.    Allergies  Allergen Reactions  . Septra [Sulfamethoxazole-Tmp Ds] Hives    Medications:  Current Outpatient Prescriptions on File Prior to Visit  Medication Sig Dispense Refill  . amitriptyline (ELAVIL) 25 MG tablet Take 25 mg by mouth daily.      . Calcium Citrate (CITRACAL PO) Take 2 tablets by mouth 2 (two) times daily.      Tery Sanfilippo Calcium (STOOL SOFTENER PO) Take 2 capsules by mouth daily.      Marland Kitchen gabapentin (NEURONTIN) 100 MG capsule Take 100 mg by mouth daily as needed. pain      . lisinopril-hydrochlorothiazide (PRINZIDE,ZESTORETIC) 20-25 MG per tablet Take 1 tablet by mouth daily.      Marland Kitchen omeprazole (PRILOSEC) 20 MG capsule Take 20 mg by mouth daily.      Bertram Gala Glycol-Propyl Glycol (SYSTANE OP) Place 1 drop into both eyes 3 (three) times daily as needed. Dry eye      . simvastatin (ZOCOR) 20 MG tablet Take 20 mg by mouth every evening.       No current facility-administered  medications on file prior to visit.    ROS:  Out of a complete 14 system review of symptoms, the patient complains only of the following symptoms, and all other reviewed systems are negative.  Hearing loss, difficulty swallowing Easy bruising Allergies Numbness, difficulty swallowing, anxiety, restless legs  Blood pressure 122/70, pulse 71, temperature 97.5 F (36.4 C), temperature source Oral, weight 146 lb (66.225 kg).  Physical Exam  General: The patient is alert and cooperative at the time of the examination.  Skin: No significant peripheral edema is noted.   Neurologic Exam  Mental status: Mini-Mental status examination done today shows a total score 29/30.  Cranial nerves: Facial symmetry is present. Speech is normal, no aphasia or dysarthria is noted. Extraocular  movements are full. Visual fields are full.  Motor: The patient has good strength in all 4 extremities.  Coordination: The patient has good finger-nose-finger and heel-to-shin bilaterally.  Gait and station: The patient has a normal gait. Tandem gait is unsteady. Romberg is negative. No drift is seen.  Reflexes: Deep tendon reflexes are symmetric, but are depressed.   Assessment/Plan:  1. Peripheral neuropathy  2. Mild gait disturbance  3. Mild memory disturbance  The patient is doing relatively well at this point. The patient will continue her current medications, and she will followup in one year or as needed. The patient will contact me if she needs an increased dose of medications for her neuropathy. The memory issues will be followed over time, and if progression is noted, medications for memory will be added.  Marlan Palau MD 01/14/2013 7:46 PM  [facility] [address] [place], [place] [address]  Phone [phone] Fax [phone]

## 2013-02-23 DIAGNOSIS — Z1231 Encounter for screening mammogram for malignant neoplasm of breast: Secondary | ICD-10-CM | POA: Diagnosis not present

## 2013-04-29 ENCOUNTER — Other Ambulatory Visit: Payer: Self-pay | Admitting: *Deleted

## 2013-04-29 MED ORDER — BACLOFEN 10 MG PO TABS: 5.0000 mg | ORAL_TABLET | Freq: Every day | ORAL | Status: DC

## 2013-04-29 MED ORDER — GABAPENTIN 600 MG PO TABS: 600.0000 mg | ORAL_TABLET | Freq: Every day | ORAL | Status: DC

## 2013-04-29 MED ORDER — GABAPENTIN 100 MG PO CAPS: 100.0000 mg | ORAL_CAPSULE | Freq: Two times a day (BID) | ORAL | Status: DC

## 2013-04-29 NOTE — Telephone Encounter (Signed)
Called patient to inform her that her Rx was ready to be picked up at the front desk and if she has any other problems, questions or concerns to call the office. Patient verbalized understanding. °

## 2013-04-29 NOTE — Telephone Encounter (Signed)
RETURNING CALL-PT DOES WANT TO PICK UP RX'S ON FRI--PLEASE CALL BACK

## 2013-04-29 NOTE — Telephone Encounter (Signed)
I called the patient back.  Said she actually has plenty of refills at the local pharmacy, and does not need refills sent to them.  She does not want [place] to send the Rx's to mail order, she would like to pick up a written copy and mail it to them herself.  Told her that would be fine and I will ask that they place the Rx at the front desk for pick up.  She said they will be in Friday to get this.  She understands we can send the Rx's directly to the pharmacy in the future if patient wishes.

## 2013-05-01 DIAGNOSIS — E785 Hyperlipidemia, unspecified: Secondary | ICD-10-CM | POA: Diagnosis not present

## 2013-05-01 DIAGNOSIS — K219 Gastro-esophageal reflux disease without esophagitis: Secondary | ICD-10-CM | POA: Diagnosis not present

## 2013-05-01 DIAGNOSIS — I10 Essential (primary) hypertension: Secondary | ICD-10-CM | POA: Diagnosis not present

## 2013-05-01 DIAGNOSIS — G609 Hereditary and idiopathic neuropathy, unspecified: Secondary | ICD-10-CM | POA: Diagnosis not present

## 2013-05-01 DIAGNOSIS — Z23 Encounter for immunization: Secondary | ICD-10-CM | POA: Diagnosis not present

## 2013-08-31 DEATH — deceased

## 2013-10-23 DIAGNOSIS — M25569 Pain in unspecified knee: Secondary | ICD-10-CM | POA: Diagnosis not present

## 2013-10-30 DIAGNOSIS — Z634 Disappearance and death of family member: Secondary | ICD-10-CM | POA: Diagnosis not present

## 2013-10-30 DIAGNOSIS — E785 Hyperlipidemia, unspecified: Secondary | ICD-10-CM | POA: Diagnosis not present

## 2013-10-30 DIAGNOSIS — I10 Essential (primary) hypertension: Secondary | ICD-10-CM | POA: Diagnosis not present

## 2014-01-07 DIAGNOSIS — Z23 Encounter for immunization: Secondary | ICD-10-CM | POA: Diagnosis not present

## 2014-01-13 ENCOUNTER — Ambulatory Visit (INDEPENDENT_AMBULATORY_CARE_PROVIDER_SITE_OTHER): Payer: Medicare Other | Admitting: Neurology

## 2014-01-13 ENCOUNTER — Encounter: Payer: Self-pay | Admitting: Neurology

## 2014-01-13 VITALS — BP 141/87 | HR 72 | Ht 61.0 in | Wt 144.8 lb

## 2014-01-13 DIAGNOSIS — R413 Other amnesia: Secondary | ICD-10-CM

## 2014-01-13 DIAGNOSIS — G63 Polyneuropathy in diseases classified elsewhere: Secondary | ICD-10-CM | POA: Diagnosis not present

## 2014-01-13 NOTE — Progress Notes (Signed)
Reason for visit: Peripheral neuropathy  Morgan Boyer is an 80 y.o. female  History of present illness:  Morgan Boyer is an 80 year old right-handed white female with a history of a peripheral neuropathy. The patient has lost her husband since last seen, and she is still grieving. The patient has ongoing issues with her peripheral neuropathy, but overall she is tolerating the discomfort fairly well. The patient takes a 600 mg gabapentin tablet at night, and she is able to rest fairly well. She has noted some gait instability, but she has not had any falls. The patient indicates that she has mild memory problems, but this has not progressed since last seen. She returns to this office for further evaluation. The patient is still handling all of her activities of daily living well, and she does operate a motor vehicle without difficulties.  Past Medical History  Diagnosis Date  . Hypertension   . H/O hiatal hernia   . Neuromuscular disorder     peripheral neuropathy  . Arthritis   . Peripheral neuropathy   . Dyslipidemia   . History of shingles     left throat 07/2007  . Polyneuropathy in other diseases classified elsewhere 01/14/2013  . Memory changes 01/14/2013  . Abnormality of gait     Past Surgical History  Procedure Laterality Date  . Tonsillectomy    . Colon surgery      2001 diverticulitis with infection ..drained surgically .  Marland Kitchen Tubal ligation    . Abdominal hysterectomy    . Joint replacement      right  . Appendectomy    . Eye surgery      cat ext bil   . Knee arthroscopy  11/08/2011  . Total knee arthroplasty  11/07/2011    Procedure: TOTAL KNEE ARTHROPLASTY;  Surgeon: Ninetta Lights, MD;  Location: [place] [facility];  Service: Orthopedics;  Laterality: Left;    Family History  Problem Relation Age of Onset  . Congestive Heart Failure Mother   . Stroke Father   . Heart attack Brother   . Diabetes Brother     Social history:  reports that she has never smoked. She has  never used smokeless tobacco. She reports that she does not drink alcohol or use illicit drugs.    Allergies  Allergen Reactions  . Septra [Sulfamethoxazole-Tmp Ds] Hives    Medications:  Current Outpatient Prescriptions on File Prior to Visit  Medication Sig Dispense Refill  . amitriptyline (ELAVIL) 25 MG tablet Take 25 mg by mouth daily.      . baclofen (LIORESAL) 10 MG tablet Take 0.5 tablets (5 mg total) by mouth at bedtime.  45 tablet  3  . Calcium Citrate (CITRACAL PO) Take 2 tablets by mouth 2 (two) times daily.      Mariane Baumgarten Calcium (STOOL SOFTENER PO) Take 2 capsules by mouth daily.      Marland Kitchen gabapentin (NEURONTIN) 600 MG tablet Take 1 tablet (600 mg total) by mouth daily.  90 tablet  3  . lisinopril-hydrochlorothiazide (PRINZIDE,ZESTORETIC) 20-25 MG per tablet Take 1 tablet by mouth daily.      Marland Kitchen omeprazole (PRILOSEC) 20 MG capsule Take 20 mg by mouth daily.      Vladimir Faster Glycol-Propyl Glycol (SYSTANE OP) Place 1 drop into both eyes 3 (three) times daily as needed. Dry eye      . simvastatin (ZOCOR) 20 MG tablet Take 20 mg by mouth every evening.       No current  facility-administered medications on file prior to visit.    ROS:  Out of a complete 14 system review of symptoms, the patient complains only of the following symptoms, and all other reviewed systems are negative.  Hearing loss, difficulty swallowing Light sensitivity, blurred vision Bruising easily Numbness  Blood pressure 141/87, pulse 72, height 5\' 1"  (1.549 m), weight 144 lb 12.8 oz (65.681 kg).  Physical Exam  General: The patient is alert and cooperative at the time of the examination.  Skin: No significant peripheral edema is noted.   Neurologic Exam  Mental status: The patient is oriented x 3. Mini-Mental status examination done today shows a total score of 29/30.  Cranial nerves: Facial symmetry is present. Speech is normal, no aphasia or dysarthria is noted. Extraocular movements are full.  Visual fields are full.  Motor: The patient has good strength in all 4 extremities.  Sensory examination: Soft touch sensation is symmetric on the face, arms, and legs. The patient does not have a clear stocking pattern pinprick sensory deficit in the legs.  Coordination: The patient has good finger-nose-finger and heel-to-shin bilaterally.  Gait and station: The patient has a normal gait. Tandem gait is unsteady. Romberg is negative. No drift is seen.  Reflexes: Deep tendon reflexes are symmetric, but are depressed.   Assessment/Plan:  One. Peripheral neuropathy  2. Mild memory disturbance  The patient will continue the gabapentin at this time, and she takes baclofen at night for cramps, taking only 5 mg. The patient will followup in 6-8 months. She is to contact me if medication adjustments are needed for her neuropathy. The patient is on amitriptyline, and if the memory issues seem to worsen, this drug should be discontinued.  Jill Alexanders MD 01/13/2014 12:15 PM  [facility] [address] [place], [place] [address]  Phone [phone] Fax [phone]

## 2014-01-13 NOTE — Patient Instructions (Signed)

## 2014-02-17 ENCOUNTER — Encounter: Payer: Self-pay | Admitting: Neurology

## 2014-02-23 ENCOUNTER — Encounter: Payer: Self-pay | Admitting: Neurology

## 2014-03-14 DIAGNOSIS — F329 Major depressive disorder, single episode, unspecified: Secondary | ICD-10-CM | POA: Diagnosis not present

## 2014-03-14 DIAGNOSIS — R42 Dizziness and giddiness: Secondary | ICD-10-CM | POA: Diagnosis not present

## 2014-03-22 ENCOUNTER — Other Ambulatory Visit: Payer: Self-pay | Admitting: Neurology

## 2014-04-05 DIAGNOSIS — H6980 Other specified disorders of Eustachian tube, unspecified ear: Secondary | ICD-10-CM | POA: Diagnosis not present

## 2014-05-06 DIAGNOSIS — F329 Major depressive disorder, single episode, unspecified: Secondary | ICD-10-CM | POA: Diagnosis not present

## 2014-05-19 DIAGNOSIS — R296 Repeated falls: Secondary | ICD-10-CM | POA: Diagnosis not present

## 2014-05-19 DIAGNOSIS — I10 Essential (primary) hypertension: Secondary | ICD-10-CM | POA: Diagnosis not present

## 2014-05-27 DIAGNOSIS — I10 Essential (primary) hypertension: Secondary | ICD-10-CM | POA: Diagnosis not present

## 2014-05-27 DIAGNOSIS — I959 Hypotension, unspecified: Secondary | ICD-10-CM | POA: Diagnosis not present

## 2014-05-27 DIAGNOSIS — E871 Hypo-osmolality and hyponatremia: Secondary | ICD-10-CM | POA: Diagnosis not present

## 2014-06-22 DIAGNOSIS — I10 Essential (primary) hypertension: Secondary | ICD-10-CM | POA: Diagnosis not present

## 2014-07-12 DIAGNOSIS — M4726 Other spondylosis with radiculopathy, lumbar region: Secondary | ICD-10-CM | POA: Diagnosis not present

## 2014-07-12 DIAGNOSIS — M47812 Spondylosis without myelopathy or radiculopathy, cervical region: Secondary | ICD-10-CM | POA: Diagnosis not present

## 2014-07-12 DIAGNOSIS — M9903 Segmental and somatic dysfunction of lumbar region: Secondary | ICD-10-CM | POA: Diagnosis not present

## 2014-07-12 DIAGNOSIS — M9901 Segmental and somatic dysfunction of cervical region: Secondary | ICD-10-CM | POA: Diagnosis not present

## 2014-07-13 DIAGNOSIS — M9903 Segmental and somatic dysfunction of lumbar region: Secondary | ICD-10-CM | POA: Diagnosis not present

## 2014-07-13 DIAGNOSIS — M47812 Spondylosis without myelopathy or radiculopathy, cervical region: Secondary | ICD-10-CM | POA: Diagnosis not present

## 2014-07-13 DIAGNOSIS — M9901 Segmental and somatic dysfunction of cervical region: Secondary | ICD-10-CM | POA: Diagnosis not present

## 2014-07-13 DIAGNOSIS — M4726 Other spondylosis with radiculopathy, lumbar region: Secondary | ICD-10-CM | POA: Diagnosis not present

## 2014-07-14 DIAGNOSIS — I10 Essential (primary) hypertension: Secondary | ICD-10-CM | POA: Diagnosis not present

## 2014-07-14 DIAGNOSIS — M9903 Segmental and somatic dysfunction of lumbar region: Secondary | ICD-10-CM | POA: Diagnosis not present

## 2014-07-14 DIAGNOSIS — M47812 Spondylosis without myelopathy or radiculopathy, cervical region: Secondary | ICD-10-CM | POA: Diagnosis not present

## 2014-07-14 DIAGNOSIS — M9901 Segmental and somatic dysfunction of cervical region: Secondary | ICD-10-CM | POA: Diagnosis not present

## 2014-07-14 DIAGNOSIS — M4726 Other spondylosis with radiculopathy, lumbar region: Secondary | ICD-10-CM | POA: Diagnosis not present

## 2014-07-15 ENCOUNTER — Ambulatory Visit (INDEPENDENT_AMBULATORY_CARE_PROVIDER_SITE_OTHER): Payer: Medicare Other | Admitting: Neurology

## 2014-07-15 ENCOUNTER — Encounter: Payer: Self-pay | Admitting: Neurology

## 2014-07-15 VITALS — BP 153/94 | HR 80 | Ht 62.0 in | Wt 149.2 lb

## 2014-07-15 DIAGNOSIS — G63 Polyneuropathy in diseases classified elsewhere: Secondary | ICD-10-CM | POA: Diagnosis not present

## 2014-07-15 DIAGNOSIS — R413 Other amnesia: Secondary | ICD-10-CM | POA: Diagnosis not present

## 2014-07-15 DIAGNOSIS — G4762 Sleep related leg cramps: Secondary | ICD-10-CM | POA: Diagnosis not present

## 2014-07-15 DIAGNOSIS — R269 Unspecified abnormalities of gait and mobility: Secondary | ICD-10-CM | POA: Diagnosis not present

## 2014-07-15 HISTORY — DX: Sleep related leg cramps: G47.62

## 2014-07-15 MED ORDER — AMITRIPTYLINE HCL 10 MG PO TABS: ORAL_TABLET | ORAL | Status: DC

## 2014-07-15 NOTE — Patient Instructions (Signed)

## 2014-07-15 NOTE — Progress Notes (Signed)
Reason for visit: Peripheral neuropathy  Morgan Boyer is an 81 y.o. female  History of present illness:  Morgan Boyer is an 81 year old right-handed white female with a history of a peripheral neuropathy. The patient recently has had some episodes of low blood pressure associated with a diuretic and her blood pressure medications. The patient's medications have been altered, and she hopefully will do better with this issue. The patient was having slumping episodes with the drop in blood pressure. The patient has continued to have some low-grade discomfort with her feet associated with the neuropathy. She indicates this has not changed much over time. She occasionally will have muscle cramps at night, and she takes baclofen for this with good improvement. She has some gait instability, she denies any recent falls. She has some problems with memory and concentration, but again this has been stable. She is on amitriptyline at 25 mg at night. She is motivated to come off of this medication at this time. She also reports some problems with gastroesophageal reflux disease.  Past Medical History  Diagnosis Date  . Hypertension   . H/O hiatal hernia   . Neuromuscular disorder     peripheral neuropathy  . Arthritis   . Peripheral neuropathy   . Dyslipidemia   . History of shingles     left throat 07/2007  . Polyneuropathy in other diseases classified elsewhere 01/14/2013  . Memory changes 01/14/2013  . Abnormality of gait   . Nocturnal leg cramps 07/15/2014    Past Surgical History  Procedure Laterality Date  . Tonsillectomy    . Colon surgery      2001 diverticulitis with infection ..drained surgically .  Marland Kitchen Tubal ligation    . Abdominal hysterectomy    . Joint replacement      right  . Appendectomy    . Eye surgery      cat ext bil   . Knee arthroscopy  11/08/2011  . Total knee arthroplasty  11/07/2011    Procedure: TOTAL KNEE ARTHROPLASTY;  Surgeon: Ninetta Lights, MD;  Location:  [place];  Service: Orthopedics;  Laterality: Left;    Family History  Problem Relation Age of Onset  . Congestive Heart Failure Mother   . Stroke Father   . Heart attack Brother   . Diabetes Brother     Social history:  reports that she has never smoked. She has never used smokeless tobacco. She reports that she does not drink alcohol or use illicit drugs.    Allergies  Allergen Reactions  . Septra [Sulfamethoxazole-Trimethoprim] Hives    Medications:  Prior to Admission medications   Medication Sig Start Date End Date Taking? Authorizing Provider  amitriptyline (ELAVIL) 25 MG tablet Take 25 mg by mouth daily.   Yes Historical Provider, MD  baclofen (LIORESAL) 10 MG tablet TAKE 1/2 TABLET AT BEDTIME 03/22/14  Yes Kathrynn Ducking, MD  Calcium Citrate (CITRACAL PO) Take 2 tablets by mouth 2 (two) times daily.   Yes Historical Provider, MD  Docusate Calcium (STOOL SOFTENER PO) Take 2 capsules by mouth daily.   Yes Historical Provider, MD  esomeprazole (NEXIUM) 20 MG capsule Take 20 mg by mouth daily at 12 noon.   Yes Historical Provider, MD  gabapentin (NEURONTIN) 600 MG tablet TAKE 1 TABLET EVERY DAY 03/22/14  Yes Kathrynn Ducking, MD  lisinopril (PRINIVIL,ZESTRIL) 20 MG tablet Take 20 mg by mouth daily.   Yes Historical Provider, MD  Polyethyl Glycol-Propyl Glycol (SYSTANE OP) Place  1 drop into both eyes 3 (three) times daily as needed. Dry eye   Yes Historical Provider, MD  simvastatin (ZOCOR) 20 MG tablet Take 20 mg by mouth every evening.   Yes Historical Provider, MD    ROS:  Out of a complete 14 system review of symptoms, the patient complains only of the following symptoms, and all other reviewed systems are negative.  Cough Restless legs Incontinence of the bladder, urinary urgency Joint pain, back pain, neck pain, neck stiffness Skin rash Bruising easily Headache  Blood pressure 153/94, pulse 80, height 5\' 2"  (1.575 m), weight 149 lb 3.2 oz (67.677 kg).  Physical  Exam  General: The patient is alert and cooperative at the time of the examination.  Skin: No significant peripheral edema is noted.   Neurologic Exam  Mental status: The patient is alert and oriented x 3 at the time of the examination. The patient has apparent normal recent and remote memory, with an apparently normal attention span and concentration ability. MMSE is 30/30.   Cranial nerves: Facial symmetry is present. Speech is normal, no aphasia or dysarthria is noted. Extraocular movements are full. Visual fields are full.  Motor: The patient has good strength in all 4 extremities.  Sensory examination: Soft touch sensation is symmetric on the face, arms, and legs. There is a stocking pattern pinprick sensory deficit two thirds the way up the legs bilaterally.  Coordination: The patient has good finger-nose-finger and heel-to-shin bilaterally.  Gait and station: The patient has a normal gait. Tandem gait is unsteady. Romberg is negative. No drift is seen.  Reflexes: Deep tendon reflexes are symmetric, but are depressed.   Assessment/Plan:  1. Peripheral neuropathy  2. Mild memory disturbance  3. Mild gait disturbance  4. Nocturnal leg cramps  The patient will continue the baclofen for the nocturnal leg cramps. She is motivated to come off of the amitriptyline, we will give her the 10 mg amitriptyline tablets, going to 2 at night for 3 weeks, then one at night for 3 weeks, and then off the medication. This could potentially help her gastroesophageal reflux disease. This may also help the memory issues. If the peripheral neuropathy discomfort worsens, we may consider other medications in the future such as Cymbalta. She will otherwise follow-up in 6 months.  Jill Alexanders MD 07/15/2014 8:50 PM  [facility] [address] [place], [place] [address]  Phone [phone] Fax [phone]

## 2014-07-19 DIAGNOSIS — M4726 Other spondylosis with radiculopathy, lumbar region: Secondary | ICD-10-CM | POA: Diagnosis not present

## 2014-07-19 DIAGNOSIS — M9901 Segmental and somatic dysfunction of cervical region: Secondary | ICD-10-CM | POA: Diagnosis not present

## 2014-07-19 DIAGNOSIS — M9903 Segmental and somatic dysfunction of lumbar region: Secondary | ICD-10-CM | POA: Diagnosis not present

## 2014-07-19 DIAGNOSIS — M47812 Spondylosis without myelopathy or radiculopathy, cervical region: Secondary | ICD-10-CM | POA: Diagnosis not present

## 2014-07-20 DIAGNOSIS — M9903 Segmental and somatic dysfunction of lumbar region: Secondary | ICD-10-CM | POA: Diagnosis not present

## 2014-07-20 DIAGNOSIS — M4726 Other spondylosis with radiculopathy, lumbar region: Secondary | ICD-10-CM | POA: Diagnosis not present

## 2014-07-20 DIAGNOSIS — M9901 Segmental and somatic dysfunction of cervical region: Secondary | ICD-10-CM | POA: Diagnosis not present

## 2014-07-20 DIAGNOSIS — M47812 Spondylosis without myelopathy or radiculopathy, cervical region: Secondary | ICD-10-CM | POA: Diagnosis not present

## 2014-07-21 DIAGNOSIS — M9901 Segmental and somatic dysfunction of cervical region: Secondary | ICD-10-CM | POA: Diagnosis not present

## 2014-07-21 DIAGNOSIS — M47812 Spondylosis without myelopathy or radiculopathy, cervical region: Secondary | ICD-10-CM | POA: Diagnosis not present

## 2014-07-21 DIAGNOSIS — M9903 Segmental and somatic dysfunction of lumbar region: Secondary | ICD-10-CM | POA: Diagnosis not present

## 2014-07-21 DIAGNOSIS — M4726 Other spondylosis with radiculopathy, lumbar region: Secondary | ICD-10-CM | POA: Diagnosis not present

## 2014-07-26 DIAGNOSIS — M9901 Segmental and somatic dysfunction of cervical region: Secondary | ICD-10-CM | POA: Diagnosis not present

## 2014-07-26 DIAGNOSIS — M47812 Spondylosis without myelopathy or radiculopathy, cervical region: Secondary | ICD-10-CM | POA: Diagnosis not present

## 2014-07-26 DIAGNOSIS — M4726 Other spondylosis with radiculopathy, lumbar region: Secondary | ICD-10-CM | POA: Diagnosis not present

## 2014-07-26 DIAGNOSIS — M9903 Segmental and somatic dysfunction of lumbar region: Secondary | ICD-10-CM | POA: Diagnosis not present

## 2014-07-28 DIAGNOSIS — M9903 Segmental and somatic dysfunction of lumbar region: Secondary | ICD-10-CM | POA: Diagnosis not present

## 2014-07-28 DIAGNOSIS — M9901 Segmental and somatic dysfunction of cervical region: Secondary | ICD-10-CM | POA: Diagnosis not present

## 2014-07-28 DIAGNOSIS — M47812 Spondylosis without myelopathy or radiculopathy, cervical region: Secondary | ICD-10-CM | POA: Diagnosis not present

## 2014-07-28 DIAGNOSIS — M4726 Other spondylosis with radiculopathy, lumbar region: Secondary | ICD-10-CM | POA: Diagnosis not present

## 2014-08-02 DIAGNOSIS — M9901 Segmental and somatic dysfunction of cervical region: Secondary | ICD-10-CM | POA: Diagnosis not present

## 2014-08-02 DIAGNOSIS — M9903 Segmental and somatic dysfunction of lumbar region: Secondary | ICD-10-CM | POA: Diagnosis not present

## 2014-08-02 DIAGNOSIS — M47812 Spondylosis without myelopathy or radiculopathy, cervical region: Secondary | ICD-10-CM | POA: Diagnosis not present

## 2014-08-02 DIAGNOSIS — M4726 Other spondylosis with radiculopathy, lumbar region: Secondary | ICD-10-CM | POA: Diagnosis not present

## 2014-08-03 DIAGNOSIS — M9903 Segmental and somatic dysfunction of lumbar region: Secondary | ICD-10-CM | POA: Diagnosis not present

## 2014-08-03 DIAGNOSIS — M9901 Segmental and somatic dysfunction of cervical region: Secondary | ICD-10-CM | POA: Diagnosis not present

## 2014-08-03 DIAGNOSIS — M4726 Other spondylosis with radiculopathy, lumbar region: Secondary | ICD-10-CM | POA: Diagnosis not present

## 2014-08-03 DIAGNOSIS — M47812 Spondylosis without myelopathy or radiculopathy, cervical region: Secondary | ICD-10-CM | POA: Diagnosis not present

## 2014-08-05 ENCOUNTER — Other Ambulatory Visit: Payer: Self-pay | Admitting: Neurology

## 2014-08-05 DIAGNOSIS — F329 Major depressive disorder, single episode, unspecified: Secondary | ICD-10-CM | POA: Diagnosis not present

## 2014-08-05 DIAGNOSIS — E785 Hyperlipidemia, unspecified: Secondary | ICD-10-CM | POA: Diagnosis not present

## 2014-08-05 DIAGNOSIS — I10 Essential (primary) hypertension: Secondary | ICD-10-CM | POA: Diagnosis not present

## 2014-08-05 DIAGNOSIS — Z79899 Other long term (current) drug therapy: Secondary | ICD-10-CM | POA: Diagnosis not present

## 2014-08-05 DIAGNOSIS — E871 Hypo-osmolality and hyponatremia: Secondary | ICD-10-CM | POA: Diagnosis not present

## 2014-08-09 DIAGNOSIS — M4726 Other spondylosis with radiculopathy, lumbar region: Secondary | ICD-10-CM | POA: Diagnosis not present

## 2014-08-09 DIAGNOSIS — M9903 Segmental and somatic dysfunction of lumbar region: Secondary | ICD-10-CM | POA: Diagnosis not present

## 2014-08-09 DIAGNOSIS — M47812 Spondylosis without myelopathy or radiculopathy, cervical region: Secondary | ICD-10-CM | POA: Diagnosis not present

## 2014-08-09 DIAGNOSIS — M9901 Segmental and somatic dysfunction of cervical region: Secondary | ICD-10-CM | POA: Diagnosis not present

## 2014-08-11 DIAGNOSIS — M4726 Other spondylosis with radiculopathy, lumbar region: Secondary | ICD-10-CM | POA: Diagnosis not present

## 2014-08-11 DIAGNOSIS — M9901 Segmental and somatic dysfunction of cervical region: Secondary | ICD-10-CM | POA: Diagnosis not present

## 2014-08-11 DIAGNOSIS — M9903 Segmental and somatic dysfunction of lumbar region: Secondary | ICD-10-CM | POA: Diagnosis not present

## 2014-08-11 DIAGNOSIS — M47812 Spondylosis without myelopathy or radiculopathy, cervical region: Secondary | ICD-10-CM | POA: Diagnosis not present

## 2014-08-16 DIAGNOSIS — M47812 Spondylosis without myelopathy or radiculopathy, cervical region: Secondary | ICD-10-CM | POA: Diagnosis not present

## 2014-08-16 DIAGNOSIS — M9903 Segmental and somatic dysfunction of lumbar region: Secondary | ICD-10-CM | POA: Diagnosis not present

## 2014-08-16 DIAGNOSIS — M9901 Segmental and somatic dysfunction of cervical region: Secondary | ICD-10-CM | POA: Diagnosis not present

## 2014-08-16 DIAGNOSIS — M4726 Other spondylosis with radiculopathy, lumbar region: Secondary | ICD-10-CM | POA: Diagnosis not present

## 2014-08-20 ENCOUNTER — Other Ambulatory Visit: Payer: Self-pay | Admitting: Neurology

## 2014-08-26 DIAGNOSIS — L821 Other seborrheic keratosis: Secondary | ICD-10-CM | POA: Diagnosis not present

## 2014-08-26 DIAGNOSIS — L814 Other melanin hyperpigmentation: Secondary | ICD-10-CM | POA: Diagnosis not present

## 2014-08-26 DIAGNOSIS — D485 Neoplasm of uncertain behavior of skin: Secondary | ICD-10-CM | POA: Diagnosis not present

## 2014-08-26 DIAGNOSIS — L309 Dermatitis, unspecified: Secondary | ICD-10-CM | POA: Diagnosis not present

## 2014-08-26 DIAGNOSIS — L570 Actinic keratosis: Secondary | ICD-10-CM | POA: Diagnosis not present

## 2014-08-27 DIAGNOSIS — C44629 Squamous cell carcinoma of skin of left upper limb, including shoulder: Secondary | ICD-10-CM | POA: Diagnosis not present

## 2014-10-13 DIAGNOSIS — C44629 Squamous cell carcinoma of skin of left upper limb, including shoulder: Secondary | ICD-10-CM | POA: Diagnosis not present

## 2014-10-18 DIAGNOSIS — R42 Dizziness and giddiness: Secondary | ICD-10-CM | POA: Diagnosis not present

## 2014-10-18 DIAGNOSIS — R55 Syncope and collapse: Secondary | ICD-10-CM | POA: Diagnosis not present

## 2014-10-19 ENCOUNTER — Other Ambulatory Visit: Payer: Self-pay | Admitting: Family Medicine

## 2014-10-19 DIAGNOSIS — R55 Syncope and collapse: Secondary | ICD-10-CM

## 2014-10-21 DIAGNOSIS — H26491 Other secondary cataract, right eye: Secondary | ICD-10-CM | POA: Diagnosis not present

## 2014-10-21 DIAGNOSIS — Z961 Presence of intraocular lens: Secondary | ICD-10-CM | POA: Diagnosis not present

## 2014-10-21 DIAGNOSIS — H04123 Dry eye syndrome of bilateral lacrimal glands: Secondary | ICD-10-CM | POA: Diagnosis not present

## 2014-10-21 DIAGNOSIS — H02403 Unspecified ptosis of bilateral eyelids: Secondary | ICD-10-CM | POA: Diagnosis not present

## 2014-10-27 ENCOUNTER — Ambulatory Visit
Admission: RE | Admit: 2014-10-27 | Discharge: 2014-10-27 | Disposition: A | Discharge status: Home / Self Care | Payer: Medicare Other | Source: Ambulatory Visit | Attending: Family Medicine | Admitting: Family Medicine

## 2014-10-27 DIAGNOSIS — I6523 Occlusion and stenosis of bilateral carotid arteries: Secondary | ICD-10-CM | POA: Diagnosis not present

## 2014-10-27 DIAGNOSIS — R55 Syncope and collapse: Secondary | ICD-10-CM

## 2014-11-10 ENCOUNTER — Ambulatory Visit (INDEPENDENT_AMBULATORY_CARE_PROVIDER_SITE_OTHER): Payer: Medicare Other | Admitting: Neurology

## 2014-11-10 ENCOUNTER — Encounter: Payer: Self-pay | Admitting: Neurology

## 2014-11-10 VITALS — BP 126/78 | HR 70 | Ht 62.0 in | Wt 145.2 lb

## 2014-11-10 DIAGNOSIS — R413 Other amnesia: Secondary | ICD-10-CM | POA: Diagnosis not present

## 2014-11-10 DIAGNOSIS — R55 Syncope and collapse: Secondary | ICD-10-CM | POA: Diagnosis not present

## 2014-11-10 DIAGNOSIS — R269 Unspecified abnormalities of gait and mobility: Secondary | ICD-10-CM

## 2014-11-10 DIAGNOSIS — G63 Polyneuropathy in diseases classified elsewhere: Secondary | ICD-10-CM

## 2014-11-10 NOTE — Patient Instructions (Addendum)
  We will check a MRI of the brain and get an EEG study to work up the episodes of weakness and dizziness. I will call with the results.   Near-Syncope Near-syncope (commonly known as near fainting) is sudden weakness, dizziness, or feeling like you might pass out. During an episode of near-syncope, you may also develop pale skin, have tunnel vision, or feel sick to your stomach (nauseous). Near-syncope may occur when getting up after sitting or while standing for a long time. It is caused by a sudden decrease in blood flow to the brain. This decrease can result from various causes or triggers, most of which are not serious. However, because near-syncope can sometimes be a sign of something serious, a medical evaluation is required. The specific cause is often not determined. HOME CARE INSTRUCTIONS  Monitor your condition for any changes. The following actions may help to alleviate any discomfort you are experiencing:  Have someone stay with you until you feel stable.  Lie down right away and prop your feet up if you start feeling like you might faint. Breathe deeply and steadily. Wait until all the symptoms have passed. Most of these episodes last only a few minutes. You may feel tired for several hours.   Drink enough fluids to keep your urine clear or pale yellow.   If you are taking blood pressure or heart medicine, get up slowly when seated or lying down. Take several minutes to sit and then stand. This can reduce dizziness.  Follow up with your health care provider as directed. SEEK IMMEDIATE MEDICAL CARE IF:   You have a severe headache.   You have unusual pain in the chest, abdomen, or back.   You are bleeding from the mouth or rectum, or you have black or tarry stool.   You have an irregular or very fast heartbeat.   You have repeated fainting or have seizure-like jerking during an episode.   You faint when sitting or lying down.   You have confusion.   You have  difficulty walking.   You have severe weakness.   You have vision problems.  MAKE SURE YOU:   Understand these instructions.  Will watch your condition.  Will get help right away if you are not doing well or get worse. Document Released: 03/19/2005 Document Revised: 03/24/2013 Document Reviewed: 08/22/2012 [facility] Patient Information 2015 [place], [place]. This information is not intended to replace advice given to you by your health care provider. Make sure you discuss any questions you have with your health care provider.

## 2014-11-10 NOTE — Progress Notes (Signed)
Reason for visit: Near syncopal events  Referring physician: Dr. Braulio Boyer is a 81 y.o. female  History of present illness:  Morgan Boyer is an 81 year old right-handed white female with a history of a peripheral neuropathy, and some mild memory issues. The patient has begun having episodes of syncope or near syncope that began in April 2016. The patient has had several events, the last event occurred approximately one month ago. The patient indicates that she is averaging one every 3 or 4 weeks. She is usually standing at a counter at home, all events have been unwitnessed. She will begin feeling lightheaded, and weak all over. She will feel jittery and slightly confused. She will fall to the floor, and it may take her 30 to 60 minutes to get back to feeling normal again. The patient may occasionally have slight diaphoresis and some slight nausea. On one occasion, she had significant nausea and vomiting and diarrhea and felt poorly for one week, but this is not usually the case with the events. The patient denies any focal numbness or weakness of the face, arms, or legs. The patient has undergone a carotid Doppler study that shows less than 50% stenosis bilaterally. The patient has been set up for cardiology, but she does not have an appointment until October 2016. She is sent to this office for an evaluation.  Past Medical History  Diagnosis Date  . Hypertension   . H/O hiatal hernia   . Neuromuscular disorder     peripheral neuropathy  . Arthritis   . Peripheral neuropathy   . Dyslipidemia   . History of shingles     left throat 07/2007  . Polyneuropathy in other diseases classified elsewhere 01/14/2013  . Memory changes 01/14/2013  . Abnormality of gait   . Nocturnal leg cramps 07/15/2014  . Esophageal reflux   . DJD (degenerative joint disease) of knee   . Diverticulosis   . Hyperlipidemia   . Adenomatous colon polyp   . Herpes zoster     throat  . Back pain   .  Depression     Past Surgical History  Procedure Laterality Date  . Tonsillectomy    . Colon surgery      2001 diverticulitis with infection ..drained surgically .  Marland Kitchen Tubal ligation    . Abdominal hysterectomy    . Joint replacement      right  . Appendectomy    . Eye surgery      cat ext bil   . Knee arthroscopy  11/08/2011  . Total knee arthroplasty  11/07/2011    Procedure: TOTAL KNEE ARTHROPLASTY;  Surgeon: Ninetta Lights, MD;  Location: [facility];  Service: Orthopedics;  Laterality: Left;  . Wrist surgery      for skin cancer    Family History  Problem Relation Age of Onset  . Congestive Heart Failure Mother   . Stroke Father   . Heart attack Brother   . Diabetes Brother     Social history:  reports that she has never smoked. She has never used smokeless tobacco. She reports that she does not drink alcohol or use illicit drugs.  Medications:  Prior to Admission medications   Medication Sig Start Date End Date Taking? Authorizing Provider  amitriptyline (ELAVIL) 25 MG tablet Take 25 mg by mouth at bedtime.   Yes Historical Provider, MD  aspirin 81 MG tablet Take 81 mg by mouth daily.   Yes Historical Provider, MD  b complex vitamins tablet Take 1 tablet by mouth daily.   Yes Historical Provider, MD  baclofen (LIORESAL) 10 MG tablet TAKE 1/2 TABLET AT BEDTIME 08/06/14  Yes Kathrynn Ducking, MD  Calcium Citrate (CITRACAL PO) Take 2 tablets by mouth 2 (two) times daily.   Yes Historical Provider, MD  Docusate Calcium (STOOL SOFTENER PO) Take 2 capsules by mouth daily.   Yes Historical Provider, MD  esomeprazole (NEXIUM) 20 MG capsule Take 20 mg by mouth daily at 12 noon.   Yes Historical Provider, MD  gabapentin (NEURONTIN) 600 MG tablet TAKE 1 TABLET EVERY DAY 08/21/14  Yes Kathrynn Ducking, MD  ibuprofen (ADVIL,MOTRIN) 200 MG tablet Take 200 mg by mouth every 6 (six) hours as needed.   Yes Historical Provider, MD  lisinopril-hydrochlorothiazide (PRINZIDE,ZESTORETIC) 20-12.5 MG  per tablet Take 0.5 tablets by mouth daily.   Yes Historical Provider, MD  Polyethyl Glycol-Propyl Glycol (SYSTANE OP) Place 1 drop into both eyes 3 (three) times daily as needed. Dry eye   Yes Historical Provider, MD  sertraline (ZOLOFT) 50 MG tablet Take 50 mg by mouth daily.   Yes Historical Provider, MD  simvastatin (ZOCOR) 20 MG tablet Take 20 mg by mouth every evening.   Yes Historical Provider, MD      Allergies  Allergen Reactions  . Septra [Sulfamethoxazole-Trimethoprim] Hives  . Lotensin [Benazepril Hcl]     ROS:  Out of a complete 14 system review of symptoms, the patient complains only of the following symptoms, and all other reviewed systems are negative.  Hearing loss Cough Leg swelling Diarrhea, vomiting Bruising easily Dizziness, passing out Skin rash  Blood pressure 126/78, pulse 70, height 5\' 2"  (1.575 m), weight 145 lb 3.2 oz (65.862 kg).   Blood pressure, right arm, sitting is 120/84. Blood pressure, right arm, standing is 152/96.  Physical Exam  General: The patient is alert and cooperative at the time of the examination.  Eyes: Pupils are equal, round, and reactive to light. Discs are flat bilaterally.  Neck: The neck is supple, no carotid bruits are noted.  Respiratory: The respiratory examination is clear.  Cardiovascular: The cardiovascular examination reveals a regular rate and rhythm, no obvious murmurs or rubs are noted.  Skin: Extremities are without significant edema.  Neurologic Exam  Mental status: The patient is alert and oriented x 3 at the time of the examination. The patient has apparent normal recent and remote memory, with an apparently normal attention span and concentration ability.  Cranial nerves: Facial symmetry is present. There is good sensation of the face to pinprick and soft touch bilaterally. The strength of the facial muscles and the muscles to head turning and shoulder shrug are normal bilaterally. Speech is well  enunciated, no aphasia or dysarthria is noted. Extraocular movements are full. Visual fields are full. The tongue is midline, and the patient has symmetric elevation of the soft palate. No obvious hearing deficits are noted.  Motor: The motor testing reveals 5 over 5 strength of all 4 extremities. Good symmetric motor tone is noted throughout.  Sensory: Sensory testing is intact to pinprick, soft touch, vibration sensation, and position sense on the upper extremities. With the lower extremities, there is a stocking pattern pinprick sensory deficit up to the knees bilaterally. There is a significant reduction in vibration and position sense in both feet. No evidence of extinction is noted.  Coordination: Cerebellar testing reveals good finger-nose-finger and heel-to-shin bilaterally.  Gait and station: Gait is normal. Tandem gait is  unsteady. Romberg is negative. No drift is seen.  Reflexes: Deep tendon reflexes are symmetric, but are depressed bilaterally. Toes are downgoing bilaterally.   Assessment/Plan:  1. Peripheral neuropathy  2. Mild memory disturbance  3. Near syncopal events  The patient is having events of weakness, dizziness, and subsequent falls. The patient may be having episodes of hypotension, but cardiac rhythm abnormalities do need to be excluded. This could represent a vasovagal syncopal event. The patient has had a carotid Doppler study is unremarkable. She will be set up for an EEG study and a MRI of the brain. She indicates that she cannot see cardiology for several months, I will go ahead and set up a prolonged cardiac monitor study. She will follow-up in October 2016. The patient will check her blood pressure following the next event. In the past, she indicates that she has had events of hypoglycemia, but this has not occurred in quite some time.  Jill Alexanders MD 11/10/2014 7:17 PM  [facility] [address] [place], [place]  [address]  Phone [phone] Fax [phone]

## 2014-11-16 ENCOUNTER — Telehealth: Payer: Self-pay | Admitting: Neurology

## 2014-11-16 NOTE — Telephone Encounter (Signed)
Patient called stating she is to have heart monitor on 11/17/14. [facility] stressed to her if she had heart monitor tomorrow she would have to wait 6 weeks to have MRI of brain. She is inquiring if she could have EEG and MRI on the same day.  Please call and advise. Patient can be reached at [phone].

## 2014-11-16 NOTE — Telephone Encounter (Signed)
Patient is scheduled @ [facility] on 8/17 @ 9:15AM. I spoke with patient and she is aware of appointment.  Thanks!

## 2014-11-16 NOTE — Telephone Encounter (Signed)
Spoke to pt, she is requesting a call back ASAP from Dr. Jannifer Franklin or his nurse regarding the heart monitor she is to have placed tomorrow. She is trying to figure out when she should have her MRI brain and EEG scheduled.

## 2014-11-16 NOTE — Telephone Encounter (Signed)
I called the patient. I advised that we get the MRI prior to the heart monitor being placed, so that the MRI does not have to be postponed so long. We have an opening for a MRI tomorrow (8/17) at 1 PM. She stated she could come to that. She is scheduled for an EEG at 2:30 tomorrow with [place]. This may need to be rescheduled. I advised she call [facility] and reschedule her heart monitor placement until after the MRI. She verbalized understanding.

## 2014-11-17 ENCOUNTER — Telehealth: Payer: Self-pay | Admitting: Neurology

## 2014-11-17 ENCOUNTER — Ambulatory Visit (INDEPENDENT_AMBULATORY_CARE_PROVIDER_SITE_OTHER): Payer: Self-pay

## 2014-11-17 ENCOUNTER — Ambulatory Visit (INDEPENDENT_AMBULATORY_CARE_PROVIDER_SITE_OTHER): Payer: Medicare Other | Admitting: Neurology

## 2014-11-17 DIAGNOSIS — G63 Polyneuropathy in diseases classified elsewhere: Secondary | ICD-10-CM

## 2014-11-17 DIAGNOSIS — R269 Unspecified abnormalities of gait and mobility: Secondary | ICD-10-CM

## 2014-11-17 DIAGNOSIS — R413 Other amnesia: Secondary | ICD-10-CM

## 2014-11-17 DIAGNOSIS — R55 Syncope and collapse: Secondary | ICD-10-CM

## 2014-11-17 DIAGNOSIS — Z0289 Encounter for other administrative examinations: Secondary | ICD-10-CM

## 2014-11-17 NOTE — Telephone Encounter (Signed)
EEG done today was unremarkable, MRI the brain is pending.  The MRI the brain was done, the results are pending. By my reading, this appears to show minimal small vessel disease, no acute changes are seen. I will call the patient if the final result is different from what I have told her. The cardiac monitor is pending.

## 2014-11-17 NOTE — Procedures (Signed)
    History:  Morgan Boyer is an 81 year old patient with a history of syncopal and near-syncopal events that have been occurring every 3-4 weeks. The patient will feel lightheaded, jittery, and then she may lose consciousness. All of the events have been unwitnessed. She is being evaluated for the these episodes.  This is a routine EEG. No skull defects are noted. Medications include amitriptyline, aspirin, baclofen, calcium supplementation, Colace, Nexium, Neurontin, ibuprofen, Prinizide, Zoloft, and Zocor.   EEG classification: Normal awake and drowsy  Description of the recording: The background rhythms of this recording consists of a fairly well modulated medium amplitude alpha rhythm of 8 Hz that is reactive to eye opening and closure. As the record progresses, the patient appears to remain in the waking state throughout the recording. Photic stimulation and hyperventilation were not performed. Toward the end of the recording, the patient enters the drowsy state with slight symmetric slowing seen. The patient never enters stage II sleep. At no time during the recording does there appear to be evidence of spike or spike wave discharges or evidence of focal slowing. EKG monitor shows no evidence of cardiac rhythm abnormalities with a heart rate of 66.  Impression: This is a normal EEG recording in the waking and drowsy state. No evidence of ictal or interictal discharges are seen.

## 2014-11-23 ENCOUNTER — Telehealth: Payer: Self-pay | Admitting: Neurology

## 2014-11-23 NOTE — Telephone Encounter (Addendum)
Patient called and stated that she and her son [name] would like to meet with Dr. Jannifer Franklin to discuss what can be done about the results of the MRI and EEG.  She stated her son could come in Monday thru Thursday next week if you feel they need to be worked in. Ms. Weisenburger can be reached [phone]. If you feel you would rather talk with the son his number is [phone].

## 2014-11-23 NOTE — Telephone Encounter (Signed)
I called the patient. The patient is to get the cardiac monitor study done. This has not yet been scheduled. She is to call the cardiology office and reschedule. The MRI and EEG evaluation did not show the etiology of the near syncopal events.

## 2014-11-24 ENCOUNTER — Ambulatory Visit (INDEPENDENT_AMBULATORY_CARE_PROVIDER_SITE_OTHER): Payer: Medicare Other

## 2014-11-24 DIAGNOSIS — R55 Syncope and collapse: Secondary | ICD-10-CM

## 2014-12-09 DIAGNOSIS — L700 Acne vulgaris: Secondary | ICD-10-CM | POA: Diagnosis not present

## 2014-12-09 DIAGNOSIS — D485 Neoplasm of uncertain behavior of skin: Secondary | ICD-10-CM | POA: Diagnosis not present

## 2014-12-09 DIAGNOSIS — C4442 Squamous cell carcinoma of skin of scalp and neck: Secondary | ICD-10-CM | POA: Diagnosis not present

## 2014-12-29 ENCOUNTER — Telehealth: Payer: Self-pay | Admitting: Neurology

## 2014-12-29 NOTE — Telephone Encounter (Signed)
Cardiac event monitor does show first-degree AV block, no etiology for syncope or near syncope found.

## 2014-12-30 NOTE — Telephone Encounter (Signed)
I called patient and relayed results. I explained that Dr. Jannifer Franklin stated there was no reason for syncope or near syncope found on cardiac monitor.

## 2015-01-03 DIAGNOSIS — C4442 Squamous cell carcinoma of skin of scalp and neck: Secondary | ICD-10-CM | POA: Diagnosis not present

## 2015-01-04 DIAGNOSIS — C4442 Squamous cell carcinoma of skin of scalp and neck: Secondary | ICD-10-CM | POA: Diagnosis not present

## 2015-01-06 DIAGNOSIS — Z23 Encounter for immunization: Secondary | ICD-10-CM | POA: Diagnosis not present

## 2015-01-07 ENCOUNTER — Ambulatory Visit (INDEPENDENT_AMBULATORY_CARE_PROVIDER_SITE_OTHER): Payer: Medicare Other | Admitting: Cardiovascular Disease

## 2015-01-07 ENCOUNTER — Encounter: Payer: Self-pay | Admitting: Cardiovascular Disease

## 2015-01-07 VITALS — BP 130/70 | HR 67 | Ht 61.0 in | Wt 147.8 lb

## 2015-01-07 DIAGNOSIS — R42 Dizziness and giddiness: Secondary | ICD-10-CM

## 2015-01-07 DIAGNOSIS — R011 Cardiac murmur, unspecified: Secondary | ICD-10-CM

## 2015-01-07 NOTE — Progress Notes (Signed)
Chief Complaint  Patient presents with  . Dizziness     History of Present Illness: 81 yo female with history of HTN, HLD, memory disorder, peripheral neuropathy, OA GERD, depression referred today for evaluation of dizziness, near syncope.  She has no known cardiac disease. She describes episodes of dizziness several months ago, occurring at rest and associated with nausea and diarrhea. No syncope. She has been seen by Neurology and EEG was ok. MRI Brain with chronic changes but no acute stroke. Carotid dopplers with mild bilateral stenosis. Event monitor with sinus rhythm and 1st degree AV block but no other arrhythmias or high grade heart block. She has had no chest pain or SOB. She has had no dizziness for two months. No LE edema. BP has been good at home.   Primary Care Physician: Gerrit Heck, MD   Past Medical History  Diagnosis Date  . Hypertension   . H/O hiatal hernia   . Neuromuscular disorder ([place])     peripheral neuropathy  . Arthritis   . Peripheral neuropathy ([place])   . Dyslipidemia   . History of shingles     left throat 07/2007  . Polyneuropathy in other diseases classified elsewhere ([place]) 01/14/2013  . Memory changes 01/14/2013  . Abnormality of gait   . Nocturnal leg cramps 07/15/2014  . Esophageal reflux   . DJD (degenerative joint disease) of knee   . Diverticulosis   . Hyperlipidemia   . Adenomatous colon polyp   . Herpes zoster     throat  . Back pain   . Depression     Past Surgical History  Procedure Laterality Date  . Tonsillectomy    . Colon surgery      2001 diverticulitis with infection ..drained surgically .  Marland Kitchen Tubal ligation    . Abdominal hysterectomy    . Joint replacement      right  . Appendectomy    . Eye surgery      cat ext bil   . Knee arthroscopy  11/08/2011  . Total knee arthroplasty  11/07/2011    Procedure: TOTAL KNEE ARTHROPLASTY;  Surgeon: Ninetta Lights, MD;  Location: [facility];  Service: Orthopedics;   Laterality: Left;  . Wrist surgery      for skin cancer    Current Outpatient Prescriptions  Medication Sig Dispense Refill  . amitriptyline (ELAVIL) 25 MG tablet Take 25 mg by mouth at bedtime.    Marland Kitchen aspirin 81 MG tablet Take 81 mg by mouth daily.    Marland Kitchen b complex vitamins tablet Take 1 tablet by mouth daily.    . baclofen (LIORESAL) 10 MG tablet TAKE 1/2 TABLET AT BEDTIME 45 tablet 1  . Calcium Citrate (CITRACAL PO) Take 2 tablets by mouth 2 (two) times daily.    Mariane Baumgarten Calcium (STOOL SOFTENER PO) Take 2 capsules by mouth daily.    Marland Kitchen esomeprazole (NEXIUM) 20 MG capsule Take 20 mg by mouth daily at 12 noon.    . gabapentin (NEURONTIN) 600 MG tablet TAKE 1 TABLET EVERY DAY 90 tablet 1  . ibuprofen (ADVIL,MOTRIN) 200 MG tablet Take 200 mg by mouth every 6 (six) hours as needed.    Marland Kitchen lisinopril-hydrochlorothiazide (PRINZIDE,ZESTORETIC) 20-12.5 MG per tablet Take 0.5 tablets by mouth daily.    Vladimir Faster Glycol-Propyl Glycol (SYSTANE OP) Place 1 drop into both eyes 3 (three) times daily as needed. Dry eye    . sertraline (ZOLOFT) 50 MG tablet Take 50 mg by mouth daily.    Marland Kitchen  simvastatin (ZOCOR) 20 MG tablet Take 20 mg by mouth every evening.     No current facility-administered medications for this visit.    Allergies  Allergen Reactions  . Septra [Sulfamethoxazole-Trimethoprim] Hives  . Lotensin [Benazepril Hcl] Other (See Comments)    NOT KNOWN    Social History   Social History  . Marital Status: Married    Spouse Name: N/A  . Number of Children: 3  . Years of Education: hs   Occupational History  . retired    Social History Main Topics  . Smoking status: Never Smoker   . Smokeless tobacco: Never Used  . Alcohol Use: No  . Drug Use: No  . Sexual Activity: No   Other Topics Concern  . Not on file   Social History Narrative   Husband deceased from lung cancer Jun 25, 2013.   Patient is right handed.   Patient drinks 2 cups of caffeine daily.       Family History    Problem Relation Age of Onset  . Congestive Heart Failure Mother   . Stroke Father 17  . Heart attack Brother 80  . Diabetes Brother     Review of Systems:  As stated in the HPI and otherwise negative.   BP 130/70 mmHg  Pulse 67  Ht 5\' 1"  (1.549 m)  Wt 147 lb 12.8 oz (67.042 kg)  BMI 27.94 kg/m2  SpO2 96%  Physical Examination: General: Well developed, well nourished, NAD HEENT: OP clear, mucus membranes moist SKIN: warm, dry. No rashes. Neuro: No focal deficits Musculoskeletal: Muscle strength 5/5 all ext Psychiatric: Mood and affect normal Neck: No JVD, no carotid bruits, no thyromegaly, no lymphadenopathy. Lungs:Clear bilaterally, no wheezes, rhonci, crackles Cardiovascular: Regular rate and rhythm. Soft systolic murmur. No gallops or rubs. Abdomen:Soft. Bowel sounds present. Non-tender.  Extremities: No lower extremity edema. Pulses are 2 + in the bilateral DP/PT.  EKG:  EKG is ordered today. The ekg ordered today demonstrates NSR, rate 67 bpm. Incomplete RBBB. Poor R wave progression precordial leads.   Recent Labs: No results found for requested labs within last 365 days.     Wt Readings from Last 3 Encounters:  01/07/15 147 lb 12.8 oz (67.042 kg)  11/10/14 145 lb 3.2 oz (65.862 kg)  07/15/14 149 lb 3.2 oz (67.677 kg)     Other studies Reviewed: Additional studies/ records that were reviewed today include: . Review of the above records demonstrates:    Assessment and Plan:   1. Dizziness/Near-Syncope: She has had a very extensive workup including brain MRI, EEG, event monitor and carotid dopplers with no explanation of symptoms. Fortunately, her symptoms have resolved. She does have a systolic murmur on exam. Will get echocardiogram to assess.   2. Cardiac murmur: See above. Echo to assess  Current medicines are reviewed at length with the patient today.  The patient does not have concerns regarding medicines.  The following changes have been made:  no  change  Labs/ tests ordered today include:   Orders Placed This Encounter  Procedures  . EKG 12-Lead  . Echocardiogram    Disposition:   FU with me in 12  months  Signed, Lauree Chandler, MD 01/07/2015 11:26 AM    [facility] [address], [place], [place]  [address] Phone: [phone]; Fax: [phone]

## 2015-01-07 NOTE — Patient Instructions (Signed)
Medication Instructions:  Your physician recommends that you continue on your current medications as directed. Please refer to the Current Medication list given to you today.   Labwork: none  Testing/Procedures: Your physician has requested that you have an echocardiogram. Echocardiography is a painless test that uses sound waves to create images of your heart. It provides your doctor with information about the size and shape of your heart and how well your heart's chambers and valves are working. This procedure takes approximately one hour. There are no restrictions for this procedure.    Follow-Up: Your physician wants you to follow-up in: 12 months.  You will receive a reminder letter in the mail two months in advance. If you don't receive a letter, please call our office to schedule the follow-up appointment.   Any Other Special Instructions Will Be Listed Below (If Applicable).   

## 2015-01-18 ENCOUNTER — Encounter: Payer: Self-pay | Admitting: Neurology

## 2015-01-18 ENCOUNTER — Ambulatory Visit (INDEPENDENT_AMBULATORY_CARE_PROVIDER_SITE_OTHER): Payer: Medicare Other | Admitting: Neurology

## 2015-01-18 ENCOUNTER — Other Ambulatory Visit: Payer: Self-pay

## 2015-01-18 ENCOUNTER — Ambulatory Visit (HOSPITAL_COMMUNITY): Payer: Medicare Other | Attending: Internal Medicine

## 2015-01-18 VITALS — BP 152/93 | HR 79 | Ht 61.0 in | Wt 147.5 lb

## 2015-01-18 DIAGNOSIS — I5189 Other ill-defined heart diseases: Secondary | ICD-10-CM | POA: Diagnosis not present

## 2015-01-18 DIAGNOSIS — I517 Cardiomegaly: Secondary | ICD-10-CM | POA: Diagnosis not present

## 2015-01-18 DIAGNOSIS — E785 Hyperlipidemia, unspecified: Secondary | ICD-10-CM | POA: Diagnosis not present

## 2015-01-18 DIAGNOSIS — G63 Polyneuropathy in diseases classified elsewhere: Secondary | ICD-10-CM | POA: Diagnosis not present

## 2015-01-18 DIAGNOSIS — R269 Unspecified abnormalities of gait and mobility: Secondary | ICD-10-CM

## 2015-01-18 DIAGNOSIS — R413 Other amnesia: Secondary | ICD-10-CM | POA: Diagnosis not present

## 2015-01-18 DIAGNOSIS — I071 Rheumatic tricuspid insufficiency: Secondary | ICD-10-CM | POA: Diagnosis not present

## 2015-01-18 DIAGNOSIS — I10 Essential (primary) hypertension: Secondary | ICD-10-CM | POA: Insufficient documentation

## 2015-01-18 DIAGNOSIS — R011 Cardiac murmur, unspecified: Secondary | ICD-10-CM | POA: Insufficient documentation

## 2015-01-18 DIAGNOSIS — I340 Nonrheumatic mitral (valve) insufficiency: Secondary | ICD-10-CM | POA: Diagnosis not present

## 2015-01-18 DIAGNOSIS — I059 Rheumatic mitral valve disease, unspecified: Secondary | ICD-10-CM | POA: Diagnosis not present

## 2015-01-18 MED ORDER — AMITRIPTYLINE HCL 10 MG PO TABS: ORAL_TABLET | ORAL | Status: DC

## 2015-01-18 NOTE — Patient Instructions (Addendum)
We will taper the amitriptyline to 20 mg at night for 2 months, then go to 10 mg at night.   Peripheral Neuropathy Peripheral neuropathy is a type of nerve damage. It affects nerves that carry signals between the spinal cord and other parts of the body. These are called peripheral nerves. With peripheral neuropathy, one nerve or a group of nerves may be damaged.  CAUSES  Many things can damage peripheral nerves. For some people with peripheral neuropathy, the cause is unknown. Some causes include:  Diabetes. This is the most common cause of peripheral neuropathy.  Injury to a nerve.  Pressure or stress on a nerve that lasts a long time.  Too little vitamin B. Alcoholism can lead to this.  Infections.  Autoimmune diseases, such as multiple sclerosis and systemic lupus erythematosus.  Inherited nerve diseases.  Some medicines, such as cancer drugs.  Toxic substances, such as lead and mercury.  Too little blood flowing to the legs.  Kidney disease.  Thyroid disease. SIGNS AND SYMPTOMS  Different people have different symptoms. The symptoms you have will depend on which of your nerves is damaged. Common symptoms include:  Loss of feeling (numbness) in the feet and hands.  Tingling in the feet and hands.  Pain that burns.  Very sensitive skin.  Weakness.  Not being able to move a part of the body (paralysis).  Muscle twitching.  Clumsiness or poor coordination.  Loss of balance.  Not being able to control your bladder.  Feeling dizzy.  Sexual problems. DIAGNOSIS  Peripheral neuropathy is a symptom, not a disease. Finding the cause of peripheral neuropathy can be hard. To figure that out, your health care provider will take a medical history and do a physical exam. A neurological exam will also be done. This involves checking things affected by your brain, spinal cord, and nerves (nervous system). For example, your health care provider will check your  reflexes, how you move, and what you can feel.  Other types of tests may also be ordered, such as:  Blood tests.  A test of the fluid in your spinal cord.  Imaging tests, such as CT scans or an MRI.  Electromyography (EMG). This test checks the nerves that control muscles.  Nerve conduction velocity tests. These tests check how fast messages pass through your nerves.  Nerve biopsy. A small piece of nerve is removed. It is then checked under a microscope. TREATMENT   Medicine is often used to treat peripheral neuropathy. Medicines may include:  Pain-relieving medicines. Prescription or over-the-counter medicine may be suggested.  Antiseizure medicine. This may be used for pain.  Antidepressants. These also may help ease pain from neuropathy.  Lidocaine. This is a numbing medicine. You might wear a patch or be given a shot.  Mexiletine. This medicine is typically used to help control irregular heart rhythms.  Surgery. Surgery may be needed to relieve pressure on a nerve or to destroy a nerve that is causing pain.  Physical therapy to help movement.  Assistive devices to help movement. HOME CARE INSTRUCTIONS   Only take over-the-counter or prescription medicines as directed by your health care provider. Follow the instructions carefully for any given medicines. Do not take any other medicines without first getting approval from your health care provider.  If you have diabetes, work closely with your health care provider to keep your blood sugar under control.  If you have numbness in your feet:  Check every day for signs of injury or  infection. Watch for redness, warmth, and swelling.  Wear padded socks and comfortable shoes. These help protect your feet.  Do not do things that put pressure on your damaged nerve.  Do not smoke. Smoking keeps blood from getting to damaged nerves.  Avoid or limit alcohol. Too much alcohol can cause a lack of B vitamins. These vitamins are  needed for healthy nerves.  Develop a good support system. Coping with peripheral neuropathy can be stressful. Talk to a mental health specialist or join a support group if you are struggling.  Follow up with your health care provider as directed. SEEK MEDICAL CARE IF:   You have new signs or symptoms of peripheral neuropathy.  You are struggling emotionally from dealing with peripheral neuropathy.  You have a fever. SEEK IMMEDIATE MEDICAL CARE IF:   You have an injury or infection that is not healing.  You feel very dizzy or begin vomiting.  You have chest pain.  You have trouble breathing.   This information is not intended to replace advice given to you by your health care provider. Make sure you discuss any questions you have with your health care provider.   Document Released: 03/09/2002 Document Revised: 11/29/2010 Document Reviewed: 11/24/2012 Elsevier Interactive Patient Education [employer].

## 2015-01-18 NOTE — Progress Notes (Signed)
Reason for visit: Peripheral neuropathy  Morgan Boyer is an 81 y.o. female  History of present illness:  Morgan Boyer is an 81 year old right-handed white female with a history of a peripheral neuropathy. The patient is sleeping relatively well at this time with the neuropathy. She has some tingling in the hands and the feet at times, she does have some mild balance issues, but she denies any falls. The patient was having episodes of near-syncope, but this has not recurred since July 2016. The patient has undergone a fairly extensive workup for this that included a 30 day cardiac monitor study, EEG evaluation, MRI, and carotid Doppler study. No etiology of her near syncopal events has been noted. She has been seen by cardiology, they have noted a mild systolic murmur, a 2-D echocardiogram has been set up. 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 She returns to this office for an evaluation. She remains on amitriptyline, 25 mg at night. When she tried to come off of it previously, she was not able to sleep well.  Past Medical History  Diagnosis Date  . Hypertension   . H/O hiatal hernia   . Neuromuscular disorder ([place])     peripheral neuropathy  . Arthritis   . Peripheral neuropathy ([place])   . Dyslipidemia   . History of shingles     left throat 07/2007  . Polyneuropathy in other diseases classified elsewhere ([place]) 01/14/2013  . Memory changes 01/14/2013  . Abnormality of gait   . Nocturnal leg cramps 07/15/2014  . Esophageal reflux   . DJD (degenerative joint disease) of knee   . Diverticulosis   . Hyperlipidemia   . Adenomatous colon polyp   . Herpes zoster     throat  . Back pain   . Depression     Past Surgical History  Procedure Laterality Date  . Tonsillectomy    . Colon surgery      2001 diverticulitis with infection ..drained surgically .  Marland Kitchen Tubal ligation    . Abdominal hysterectomy    . Joint replacement      right  . Appendectomy    . Eye surgery      cat ext bil   . Knee arthroscopy  11/08/2011  . Total knee arthroplasty  11/07/2011    Procedure: TOTAL KNEE ARTHROPLASTY;  Surgeon: Ninetta Lights, MD;  Location: [facility];  Service: Orthopedics;  Laterality: Left;  . Wrist surgery      for skin cancer    Family History  Problem Relation Age of Onset  . Congestive Heart Failure Mother   . Stroke Father 67  . Heart attack Brother 13  . Diabetes Brother     Social history:  reports that she has never smoked. She has never used smokeless tobacco. She reports that she does not drink alcohol or use illicit drugs.    Allergies  Allergen Reactions  . Septra [Sulfamethoxazole-Trimethoprim] Hives  . Lotensin [Benazepril Hcl] Other (See Comments)    NOT KNOWN    Medications:  Prior to Admission medications   Medication Sig Start Date End Date Taking? Authorizing Provider  amitriptyline (ELAVIL) 25 MG tablet Take 25 mg by mouth at bedtime.   Yes Historical Provider, MD  aspirin 81 MG tablet Take 81 mg by mouth daily.   Yes Historical Provider, MD  b complex vitamins tablet Take 1 tablet by mouth daily.   Yes Historical Provider,  MD  baclofen (LIORESAL) 10 MG tablet TAKE 1/2 TABLET AT BEDTIME 08/06/14  Yes Kathrynn Ducking, MD  Calcium Citrate (CITRACAL PO) Take 2 tablets by mouth 2 (two) times daily.   Yes Historical Provider, MD  Docusate Calcium (STOOL SOFTENER PO) Take 2 capsules by mouth daily.   Yes Historical Provider, MD  esomeprazole (NEXIUM) 20 MG capsule Take 20 mg by mouth daily at 12 noon.   Yes Historical Provider, MD  gabapentin (NEURONTIN) 600 MG tablet TAKE 1 TABLET EVERY DAY 08/21/14  Yes Kathrynn Ducking, MD  ibuprofen (ADVIL,MOTRIN) 200 MG tablet Take 200 mg by mouth every 6 (six) hours as needed.   Yes Historical Provider, MD  lisinopril-hydrochlorothiazide (PRINZIDE,ZESTORETIC) 20-12.5 MG per tablet Take 0.5 tablets by mouth daily.   Yes Historical Provider, MD  Polyethyl  Glycol-Propyl Glycol (SYSTANE OP) Place 1 drop into both eyes 3 (three) times daily as needed. Dry eye   Yes Historical Provider, MD  sertraline (ZOLOFT) 50 MG tablet Take 50 mg by mouth daily.   Yes Historical Provider, MD  simvastatin (ZOCOR) 20 MG tablet Take 20 mg by mouth every evening.   Yes Historical Provider, MD    ROS:  Out of a complete 14 system review of symptoms, the patient complains only of the following symptoms, and all other reviewed systems are negative.  Hearing loss, difficulty swallowing Cough Incontinence of bladder, frequency of urination Bruising easily Numbness  Blood pressure 152/93, pulse 79, height 5\' 1"  (1.549 m), weight 147 lb 8 oz (66.906 kg).  Physical Exam  General: The patient is alert and cooperative at the time of the examination.  Skin: No significant peripheral edema is noted.   Neurologic Exam  Mental status: The patient is alert and oriented x 3 at the time of the examination. The patient has apparent normal recent and remote memory, with an apparently normal attention span and concentration ability. Mini-Mental Status Examination done today shows a total score of 30/30. The patient is able to name 5 animals in 30 seconds.   Cranial nerves: Facial symmetry is present. Speech is normal, no aphasia or dysarthria is noted. Extraocular movements are full. Visual fields are full.  Motor: The patient has good strength in all 4 extremities.  Sensory examination: Soft touch sensation is symmetric on the face, arms, and legs. There is a stocking pattern pinprick sensory deficit up to the knees bilaterally.  Coordination: The patient has good finger-nose-finger and heel-to-shin bilaterally.  Gait and station: The patient has a normal gait. Tandem gait is slightly unsteady. Romberg is negative. No drift is seen.  Reflexes: Deep tendon reflexes are symmetric.   MRI brain 11/18/14:  IMPRESSION: Abnormal MRI brain (without) demonstrating: 1.  Mild periventricular and subcortical foci of non-specific gliosis. 2. Mild perisylvian atrophy. Mild ventriculomegaly on ex vacuo basis.  3. No acute findings.  Assessment/Plan:  1. Peripheral neuropathy  2. Mild gait disturbance  3. Near syncopal events  4. Mild memory disturbance  Once again, I will try to taper the patient off of the amitriptyline. She will go to 20 mg at night for 2 months, then go to 10 mg at night. If the patient has difficulty with increased pain with her neuropathy, she is to contact our office, we may switch her to Cymbalta. The patient will follow-up otherwise in 6 months.  Jill Alexanders MD 01/18/2015 8:13 PM  [facility] [address] [place], [place] [address]  Phone [phone] Fax [phone]

## 2015-01-21 ENCOUNTER — Other Ambulatory Visit: Payer: Self-pay | Admitting: Neurology

## 2015-01-26 ENCOUNTER — Telehealth: Payer: Self-pay | Admitting: Neurology

## 2015-01-26 NOTE — Telephone Encounter (Signed)
Patient called to request that we fax change in dosage of amitriptyline (ELAVIL) 10 MG tablet to [facility] Fax# [phone].

## 2015-01-26 NOTE — Telephone Encounter (Signed)
This Rx was sent to [facility] on 10/18.  I contacted the pharmacy, who verified they do have this Rx on file, and they are currently processing the order Ref # [id number]

## 2015-02-07 DIAGNOSIS — Z1389 Encounter for screening for other disorder: Secondary | ICD-10-CM | POA: Diagnosis not present

## 2015-02-07 DIAGNOSIS — E7800 Pure hypercholesterolemia, unspecified: Secondary | ICD-10-CM | POA: Diagnosis not present

## 2015-02-07 DIAGNOSIS — F339 Major depressive disorder, recurrent, unspecified: Secondary | ICD-10-CM | POA: Diagnosis not present

## 2015-02-07 DIAGNOSIS — I10 Essential (primary) hypertension: Secondary | ICD-10-CM | POA: Diagnosis not present

## 2015-02-07 DIAGNOSIS — Z79899 Other long term (current) drug therapy: Secondary | ICD-10-CM | POA: Diagnosis not present

## 2015-03-09 DIAGNOSIS — I10 Essential (primary) hypertension: Secondary | ICD-10-CM | POA: Diagnosis not present

## 2015-03-09 DIAGNOSIS — R42 Dizziness and giddiness: Secondary | ICD-10-CM | POA: Diagnosis not present

## 2015-07-27 ENCOUNTER — Ambulatory Visit: Payer: Medicare Other | Admitting: Neurology

## 2015-08-02 DIAGNOSIS — M545 Low back pain: Secondary | ICD-10-CM | POA: Diagnosis not present

## 2015-08-10 ENCOUNTER — Encounter: Payer: Self-pay | Admitting: Neurology

## 2015-08-10 ENCOUNTER — Ambulatory Visit (INDEPENDENT_AMBULATORY_CARE_PROVIDER_SITE_OTHER): Payer: Medicare Other | Admitting: Neurology

## 2015-08-10 VITALS — BP 146/74 | HR 60 | Ht 61.0 in | Wt 144.5 lb

## 2015-08-10 DIAGNOSIS — R413 Other amnesia: Secondary | ICD-10-CM | POA: Diagnosis not present

## 2015-08-10 DIAGNOSIS — R269 Unspecified abnormalities of gait and mobility: Secondary | ICD-10-CM

## 2015-08-10 DIAGNOSIS — G4762 Sleep related leg cramps: Secondary | ICD-10-CM

## 2015-08-10 DIAGNOSIS — G63 Polyneuropathy in diseases classified elsewhere: Secondary | ICD-10-CM | POA: Diagnosis not present

## 2015-08-10 MED ORDER — BACLOFEN 10 MG PO TABS: 10.0000 mg | ORAL_TABLET | Freq: Every day | ORAL | Status: DC

## 2015-08-10 MED ORDER — GABAPENTIN 600 MG PO TABS: 900.0000 mg | ORAL_TABLET | Freq: Every day | ORAL | Status: DC

## 2015-08-10 NOTE — Progress Notes (Signed)
Reason for visit: Peripheral neuropathy  Morgan Boyer is an 82 y.o. female  History of present illness:  Morgan Boyer is an 82 year old right-handed white female with a history of a peripheral neuropathy. The patient is on gabapentin taking 600 mg at night. She has developed increasing problems with low back pain and some pain down into the buttocks going down to the knees bilaterally. The patient has been seen by an orthopedic surgeon, MRI of the lumbar spine has been ordered, but not yet done. She will follow-up with her orthopedic doctor, they may do an epidural steroid injection. The patient has reported some mild balance issues, occasionally she will use a cane for ambulation. She denies any falls since last seen. She has ongoing issues with mild memory problems, she does not believe that this has changed much over time. The patient has been able to get off of the amitriptyline completely. She also reports some nocturnal leg cramps on baclofen taking 5 mg at night. She is still having nights where she has cramps in the legs. She returns to this office for an evaluation.  Past Medical History  Diagnosis Date  . Hypertension   . H/O hiatal hernia   . Neuromuscular disorder ([place])     peripheral neuropathy  . Arthritis   . Peripheral neuropathy ([place])   . Dyslipidemia   . History of shingles     left throat 07/2007  . Polyneuropathy in other diseases classified elsewhere ([place]) 01/14/2013  . Memory changes 01/14/2013  . Abnormality of gait   . Nocturnal leg cramps 07/15/2014  . Esophageal reflux   . DJD (degenerative joint disease) of knee   . Diverticulosis   . Hyperlipidemia   . Adenomatous colon polyp   . Herpes zoster     throat  . Back pain   . Depression     Past Surgical History  Procedure Laterality Date  . Tonsillectomy    . Colon surgery      2001 diverticulitis with infection ..drained surgically .  Marland Kitchen Tubal ligation    . Abdominal hysterectomy    . Joint  replacement      right  . Appendectomy    . Eye surgery      cat ext bil   . Knee arthroscopy  11/08/2011  . Total knee arthroplasty  11/07/2011    Procedure: TOTAL KNEE ARTHROPLASTY;  Surgeon: Ninetta Lights, MD;  Location: [facility];  Service: Orthopedics;  Laterality: Left;  . Wrist surgery      for skin cancer    Family History  Problem Relation Age of Onset  . Congestive Heart Failure Mother   . Stroke Father 54  . Heart attack Brother 68  . Diabetes Brother     Social history:  reports that she has never smoked. She has never used smokeless tobacco. She reports that she does not drink alcohol or use illicit drugs.    Allergies  Allergen Reactions  . Septra [Sulfamethoxazole-Trimethoprim] Hives  . Lotensin [Benazepril Hcl] Other (See Comments)    NOT KNOWN    Medications:  Prior to Admission medications   Medication Sig Start Date End Date Taking? Authorizing Provider  aspirin 81 MG tablet Take 81 mg by mouth daily.   Yes Historical Provider, MD  b complex vitamins tablet Take 1 tablet by mouth daily.   Yes Historical Provider, MD  baclofen (LIORESAL) 10 MG tablet Take 1 tablet (10 mg total) by mouth at bedtime. 08/10/15  Yes Kathrynn Ducking, MD  Calcium Citrate (CITRACAL PO) Take 2 tablets by mouth 2 (two) times daily.   Yes Historical Provider, MD  Docusate Calcium (STOOL SOFTENER PO) Take 2 capsules by mouth daily.   Yes Historical Provider, MD  esomeprazole (NEXIUM) 20 MG capsule Take 20 mg by mouth daily at 12 noon.   Yes Historical Provider, MD  gabapentin (NEURONTIN) 600 MG tablet Take 1.5 tablets (900 mg total) by mouth at bedtime. 08/10/15  Yes Kathrynn Ducking, MD  ibuprofen (ADVIL,MOTRIN) 200 MG tablet Take 200 mg by mouth every 6 (six) hours as needed.   Yes Historical Provider, MD  lisinopril-hydrochlorothiazide (PRINZIDE,ZESTORETIC) 20-12.5 MG per tablet Take 0.5 tablets by mouth daily.   Yes Historical Provider, MD  Polyethyl Glycol-Propyl Glycol (SYSTANE OP)  Place 1 drop into both eyes 3 (three) times daily as needed. Dry eye   Yes Historical Provider, MD  simvastatin (ZOCOR) 20 MG tablet Take 20 mg by mouth every evening.   Yes Historical Provider, MD    ROS:  Out of a complete 14 system review of symptoms, the patient complains only of the following symptoms, and all other reviewed systems are negative.  Hearing loss Cough Leg swelling Back pain, walking difficulty Skin rash Memory loss, numbness  Blood pressure 146/74, pulse 60, height 5\' 1"  (1.549 m), weight 144 lb 8 oz (65.545 kg).  Physical Exam  General: The patient is alert and cooperative at the time of the examination.  Skin: No significant peripheral edema is noted.   Neurologic Exam  Mental status: The patient is alert and oriented x 3 at the time of the examination. The patient has apparent normal recent and remote memory, with an apparently normal attention span and concentration ability. Mini-Mental Status Examination done today shows a total score of 29/30.   Cranial nerves: Facial symmetry is present. Speech is normal, no aphasia or dysarthria is noted. Extraocular movements are full. Visual fields are full.  Motor: The patient has good strength in all 4 extremities.  Sensory examination: Soft touch sensation is symmetric on the face, arms, and legs.  Coordination: The patient has good finger-nose-finger and heel-to-shin bilaterally.  Gait and station: The patient has a normal gait. Tandem gait is unsteady. Romberg is negative. No drift is seen.  Reflexes: Deep tendon reflexes are symmetric.   Assessment/Plan:  1. Peripheral neuropathy  2. Mild gait disturbance  3. Back pain, bilateral leg pain  4. Nocturnal leg cramps  5. Mild memory disturbance  The patient will be increased on the gabapentin taking 900 mg at night, the baclofen will be increased to 10 mg at night. The patient will follow-up in about 6 months. We will continue to follow the memory  issues over time. She seems to be stable in this regard at this time. The patient is being followed by orthopedic surgery for her low back pain. She will have MRI evaluation at [facility] next week.  Jill Alexanders MD 08/10/2015 7:52 PM  [facility] [address] [place], [place] [address]  Phone [phone] Fax [phone]

## 2015-08-15 DIAGNOSIS — I10 Essential (primary) hypertension: Secondary | ICD-10-CM | POA: Diagnosis not present

## 2015-08-15 DIAGNOSIS — Z1389 Encounter for screening for other disorder: Secondary | ICD-10-CM | POA: Diagnosis not present

## 2015-08-15 DIAGNOSIS — E7800 Pure hypercholesterolemia, unspecified: Secondary | ICD-10-CM | POA: Diagnosis not present

## 2015-08-15 DIAGNOSIS — R011 Cardiac murmur, unspecified: Secondary | ICD-10-CM | POA: Diagnosis not present

## 2015-08-15 DIAGNOSIS — L659 Nonscarring hair loss, unspecified: Secondary | ICD-10-CM | POA: Diagnosis not present

## 2015-08-15 DIAGNOSIS — Z79899 Other long term (current) drug therapy: Secondary | ICD-10-CM | POA: Diagnosis not present

## 2015-08-16 DIAGNOSIS — M5127 Other intervertebral disc displacement, lumbosacral region: Secondary | ICD-10-CM | POA: Diagnosis not present

## 2015-08-16 DIAGNOSIS — M5126 Other intervertebral disc displacement, lumbar region: Secondary | ICD-10-CM | POA: Diagnosis not present

## 2015-08-23 DIAGNOSIS — M545 Low back pain: Secondary | ICD-10-CM | POA: Diagnosis not present

## 2015-09-03 ENCOUNTER — Emergency Department (HOSPITAL_COMMUNITY): Payer: Medicare Other

## 2015-09-03 ENCOUNTER — Encounter (HOSPITAL_COMMUNITY): Payer: Self-pay | Admitting: Emergency Medicine

## 2015-09-03 ENCOUNTER — Emergency Department (HOSPITAL_COMMUNITY)
Admission: EM | Admit: 2015-09-03 | Discharge: 2015-09-03 | Disposition: A | Discharge status: Home / Self Care | Payer: Medicare Other | Attending: Emergency Medicine | Admitting: Emergency Medicine

## 2015-09-03 DIAGNOSIS — G629 Polyneuropathy, unspecified: Secondary | ICD-10-CM | POA: Insufficient documentation

## 2015-09-03 DIAGNOSIS — Z9660 Presence of unspecified orthopedic joint implant: Secondary | ICD-10-CM | POA: Diagnosis not present

## 2015-09-03 DIAGNOSIS — Y999 Unspecified external cause status: Secondary | ICD-10-CM | POA: Diagnosis not present

## 2015-09-03 DIAGNOSIS — S62112A Displaced fracture of triquetrum [cuneiform] bone, left wrist, initial encounter for closed fracture: Secondary | ICD-10-CM | POA: Diagnosis not present

## 2015-09-03 DIAGNOSIS — W01198A Fall on same level from slipping, tripping and stumbling with subsequent striking against other object, initial encounter: Secondary | ICD-10-CM | POA: Diagnosis not present

## 2015-09-03 DIAGNOSIS — F329 Major depressive disorder, single episode, unspecified: Secondary | ICD-10-CM | POA: Diagnosis not present

## 2015-09-03 DIAGNOSIS — Y929 Unspecified place or not applicable: Secondary | ICD-10-CM | POA: Insufficient documentation

## 2015-09-03 DIAGNOSIS — I10 Essential (primary) hypertension: Secondary | ICD-10-CM | POA: Insufficient documentation

## 2015-09-03 DIAGNOSIS — S6991XA Unspecified injury of right wrist, hand and finger(s), initial encounter: Secondary | ICD-10-CM | POA: Diagnosis not present

## 2015-09-03 DIAGNOSIS — E876 Hypokalemia: Secondary | ICD-10-CM | POA: Insufficient documentation

## 2015-09-03 DIAGNOSIS — S80211A Abrasion, right knee, initial encounter: Secondary | ICD-10-CM | POA: Diagnosis not present

## 2015-09-03 DIAGNOSIS — R42 Dizziness and giddiness: Secondary | ICD-10-CM

## 2015-09-03 DIAGNOSIS — Y939 Activity, unspecified: Secondary | ICD-10-CM | POA: Diagnosis not present

## 2015-09-03 DIAGNOSIS — E785 Hyperlipidemia, unspecified: Secondary | ICD-10-CM | POA: Diagnosis not present

## 2015-09-03 DIAGNOSIS — M79641 Pain in right hand: Secondary | ICD-10-CM | POA: Diagnosis not present

## 2015-09-03 DIAGNOSIS — M1990 Unspecified osteoarthritis, unspecified site: Secondary | ICD-10-CM | POA: Diagnosis not present

## 2015-09-03 DIAGNOSIS — S61212A Laceration without foreign body of right middle finger without damage to nail, initial encounter: Secondary | ICD-10-CM | POA: Diagnosis present

## 2015-09-03 LAB — CBC
HCT: 35.6 % — ABNORMAL LOW (ref 36.0–46.0)
Hemoglobin: 12.3 g/dL (ref 12.0–15.0)
MCH: 29.4 pg (ref 26.0–34.0)
MCHC: 34.6 g/dL (ref 30.0–36.0)
MCV: 85 fL (ref 78.0–100.0)
Platelets: 200 10*3/uL (ref 150–400)
RBC: 4.19 MIL/uL (ref 3.87–5.11)
RDW: 13.5 % (ref 11.5–15.5)
WBC: 7.9 10*3/uL (ref 4.0–10.5)

## 2015-09-03 LAB — URINE MICROSCOPIC-ADD ON: BACTERIA UA: NONE SEEN

## 2015-09-03 LAB — BASIC METABOLIC PANEL
ANION GAP: 7 (ref 5–15)
BUN: 13 mg/dL (ref 6–20)
CHLORIDE: 105 mmol/L (ref 101–111)
CO2: 28 mmol/L (ref 22–32)
Calcium: 9.3 mg/dL (ref 8.9–10.3)
Creatinine, Ser: 0.68 mg/dL (ref 0.44–1.00)
GFR calc Af Amer: >60 mL/min (ref >60)
Glucose, Bld: 98 mg/dL (ref 65–99)
POTASSIUM: 3.4 mmol/L — AB (ref 3.5–5.1)
SODIUM: 140 mmol/L (ref 135–145)

## 2015-09-03 LAB — URINALYSIS, ROUTINE W REFLEX MICROSCOPIC
Bilirubin Urine: NEGATIVE
GLUCOSE, UA: NEGATIVE mg/dL
Hgb urine dipstick: NEGATIVE
Ketones, ur: NEGATIVE mg/dL
NITRITE: NEGATIVE
PH: 6.5 (ref 5.0–8.0)
Protein, ur: NEGATIVE mg/dL
SPECIFIC GRAVITY, URINE: 1.017 (ref 1.005–1.030)

## 2015-09-03 LAB — CBG MONITORING, ED: Glucose-Capillary: 96 mg/dL (ref 65–99)

## 2015-09-03 MED ORDER — POTASSIUM CHLORIDE CRYS ER 20 MEQ PO TBCR
40.0000 meq | EXTENDED_RELEASE_TABLET | Freq: Once | ORAL | Status: AC
  Administered 2015-09-03: 40 meq via ORAL
  Filled 2015-09-03: qty 2

## 2015-09-03 MED ORDER — BACITRACIN ZINC 500 UNIT/GM EX OINT
TOPICAL_OINTMENT | Freq: Two times a day (BID) | CUTANEOUS | Status: DC
  Administered 2015-09-03: 21:00:00 via TOPICAL
  Filled 2015-09-03: qty 1.8

## 2015-09-03 NOTE — ED Provider Notes (Signed)
CSN: [id number]     Arrival date & time 09/03/15  1729 History   First MD Initiated Contact with Patient 09/03/15 1908     Chief Complaint  Patient presents with  . Fall  . Dizziness     (Consider location/radiation/quality/duration/timing/severity/associated sxs/prior Treatment) HPI A shot has been feeling dizzy i.e. feeling of room spinning since yesterday. She treated herself with meclizine earlier today. No longer feels dizziness. She fell at 5:45 AM injuring both hands both knees and both wrists. She also suffered a laceration to middle finger of right hand as result fall. Pain is minimal. She has been ambulatory since the event. Dizziness worse with moving her head or changing positions improved with remaining still. No other associated symptoms..  Past Medical History  Diagnosis Date  . Hypertension   . H/O hiatal hernia   . Neuromuscular disorder ([place])     peripheral neuropathy  . Arthritis   . Peripheral neuropathy ([place])   . Dyslipidemia   . History of shingles     left throat 07/2007  . Polyneuropathy in other diseases classified elsewhere ([place]) 01/14/2013  . Memory changes 01/14/2013  . Abnormality of gait   . Nocturnal leg cramps 07/15/2014  . Esophageal reflux   . DJD (degenerative joint disease) of knee   . Diverticulosis   . Hyperlipidemia   . Adenomatous colon polyp   . Herpes zoster     throat  . Back pain   . Depression   Vertigo Past Surgical History  Procedure Laterality Date  . Tonsillectomy    . Colon surgery      2001 diverticulitis with infection ..drained surgically .  Marland Kitchen Tubal ligation    . Abdominal hysterectomy    . Joint replacement      right  . Appendectomy    . Eye surgery      cat ext bil   . Knee arthroscopy  11/08/2011  . Total knee arthroplasty  11/07/2011    Procedure: TOTAL KNEE ARTHROPLASTY;  Surgeon: Ninetta Lights, MD;  Location: [facility];  Service: Orthopedics;  Laterality: Left;  . Wrist surgery      for skin cancer   Family  History  Problem Relation Age of Onset  . Congestive Heart Failure Mother   . Stroke Father 49  . Heart attack Brother 79  . Diabetes Brother    Social History  Substance Use Topics  . Smoking status: Never Smoker   . Smokeless tobacco: Never Used  . Alcohol Use: No   OB History    No data available     Review of Systems  Musculoskeletal: Positive for arthralgias and gait problem.       Walks with cane or walker occasionally  Skin: Positive for wound.       Laceration right middle finger  Neurological: Positive for dizziness.  All other systems reviewed and are negative.     Allergies  Septra and Lotensin  Home Medications   Prior to Admission medications   Medication Sig Start Date End Date Taking? Authorizing Provider  aspirin 81 MG tablet Take 81 mg by mouth daily.    Historical Provider, MD  b complex vitamins tablet Take 1 tablet by mouth daily.    Historical Provider, MD  baclofen (LIORESAL) 10 MG tablet Take 1 tablet (10 mg total) by mouth at bedtime. 08/10/15   Kathrynn Ducking, MD  Calcium Citrate (CITRACAL PO) Take 2 tablets by mouth 2 (two) times daily.  Historical Provider, MD  Docusate Calcium (STOOL SOFTENER PO) Take 2 capsules by mouth daily.    Historical Provider, MD  esomeprazole (NEXIUM) 20 MG capsule Take 20 mg by mouth daily at 12 noon.    Historical Provider, MD  gabapentin (NEURONTIN) 600 MG tablet Take 1.5 tablets (900 mg total) by mouth at bedtime. 08/10/15   Kathrynn Ducking, MD  ibuprofen (ADVIL,MOTRIN) 200 MG tablet Take 200 mg by mouth every 6 (six) hours as needed.    Historical Provider, MD  lisinopril-hydrochlorothiazide (PRINZIDE,ZESTORETIC) 20-12.5 MG per tablet Take 0.5 tablets by mouth daily.    Historical Provider, MD  Polyethyl Glycol-Propyl Glycol (SYSTANE OP) Place 1 drop into both eyes 3 (three) times daily as needed. Dry eye    Historical Provider, MD  simvastatin (ZOCOR) 20 MG tablet Take 20 mg by mouth every evening.     Historical Provider, MD   BP 160/83 mmHg  Pulse 71  Temp(Src) 98.1 F (36.7 C)  Resp 18  SpO2 99% Physical Exam  Constitutional: She is oriented to person, place, and time. She appears well-developed and well-nourished.  HENT:  Head: Normocephalic and atraumatic.  Eyes: Conjunctivae are normal. Pupils are equal, round, and reactive to light.  Neck: Neck supple. No tracheal deviation present. No thyromegaly present.  Cardiovascular: Normal rate and regular rhythm.   No murmur heard. Pulmonary/Chest: Effort normal and breath sounds normal.  Abdominal: Soft. Bowel sounds are normal. She exhibits no distension. There is no tenderness.  Musculoskeletal: Normal range of motion. She exhibits no edema or tenderness.  Right upper extremity 1 cm crescent-shaped laceration to middle finger proximal phalanx dorsal aspect. No active bleeding tenderness at dorsum of hand. Full range of motion good capillary refill No deformity. Left upper extremity skin intact tender at the volar wrist. No anatomic snuffbox tenderness. Full range of motion. Right lower extremity with time sized abrasion at anterior knee. Swelling no deformity no bony tenderness. Left lower extremity without contusion abrasion or tenderness neurovascularly intact.  Neurological: She is alert and oriented to person, place, and time. No cranial nerve deficit. Coordination normal.  Gait normal. Walks unassisted Romberg normal pronator drift normal finger to nose normal  Skin: Skin is warm and dry. No rash noted.  Psychiatric: She has a normal mood and affect.  Nursing note and vitals reviewed.   ED Course  Procedures (including critical care time) Labs Review Labs Reviewed  BASIC METABOLIC PANEL - Abnormal; Notable for the following:    Potassium 3.4 (*)    All other components within normal limits  CBC - Abnormal; Notable for the following:    HCT 35.6 (*)    All other components within normal limits  URINALYSIS, ROUTINE W REFLEX  MICROSCOPIC (NOT AT [facility]) - Abnormal; Notable for the following:    Leukocytes, UA SMALL (*)    All other components within normal limits  URINE MICROSCOPIC-ADD ON - Abnormal; Notable for the following:    Squamous Epithelial / LPF 0-5 (*)    All other components within normal limits  CBG MONITORING, ED    Imaging Review No results found. I have personally reviewed and evaluated these images and lab results as part of my medical decision-making.   EKG Interpretation   Date/Time:  Saturday September 03 2015 17:44:17 EDT Ventricular Rate:  70 PR Interval:  202 QRS Duration: 110 QT Interval:  432 QTC Calculation: 466 R Axis:   -45 Text Interpretation:  Sinus rhythm Incomplete RBBB and LAFB Anteroseptal  infarct,  old Baseline wander in lead(s) III No significant change since  last tracing Confirmed by Winfred Leeds  MD, Windy Dudek [phone]) on 09/03/2015 5:51:29  PM     Patient declines pain medicine. X-rays viewed by me. Alcohol wrist splint was placed on patient's left wrist by orthopedic technician which was adjusted by me. 10:10 PM patient is alert Glasgow Coma Score 15 ambulates without difficulty Results for orders placed or performed during the hospital encounter of [id number]  Basic metabolic panel  Result Value Ref Range   Sodium 140 135 - 145 mmol/L   Potassium 3.4 (L) 3.5 - 5.1 mmol/L   Chloride 105 101 - 111 mmol/L   CO2 28 22 - 32 mmol/L   Glucose, Bld 98 65 - 99 mg/dL   BUN 13 6 - 20 mg/dL   Creatinine, Ser 0.68 0.44 - 1.00 mg/dL   Calcium 9.3 8.9 - 10.3 mg/dL   GFR calc non Af Amer >60 >60 mL/min   GFR calc Af Amer >60 >60 mL/min   Anion gap 7 5 - 15  CBC  Result Value Ref Range   WBC 7.9 4.0 - 10.5 K/uL   RBC 4.19 3.87 - 5.11 MIL/uL   Hemoglobin 12.3 12.0 - 15.0 g/dL   HCT 35.6 (L) 36.0 - 46.0 %   MCV 85.0 78.0 - 100.0 fL   MCH 29.4 26.0 - 34.0 pg   MCHC 34.6 30.0 - 36.0 g/dL   RDW 13.5 11.5 - 15.5 %   Platelets 200 150 - 400 K/uL  Urinalysis, Routine w reflex  microscopic  Result Value Ref Range   Color, Urine YELLOW YELLOW   APPearance CLEAR CLEAR   Specific Gravity, Urine 1.017 1.005 - 1.030   pH 6.5 5.0 - 8.0   Glucose, UA NEGATIVE NEGATIVE mg/dL   Hgb urine dipstick NEGATIVE NEGATIVE   Bilirubin Urine NEGATIVE NEGATIVE   Ketones, ur NEGATIVE NEGATIVE mg/dL   Protein, ur NEGATIVE NEGATIVE mg/dL   Nitrite NEGATIVE NEGATIVE   Leukocytes, UA SMALL (A) NEGATIVE  Urine microscopic-add on  Result Value Ref Range   Squamous Epithelial / LPF 0-5 (A) NONE SEEN   WBC, UA 6-30 0 - 5 WBC/hpf   RBC / HPF 0-5 0 - 5 RBC/hpf   Bacteria, UA NONE SEEN NONE SEEN  CBG monitoring, ED  Result Value Ref Range   Glucose-Capillary 96 65 - 99 mg/dL   Dg Wrist Complete Left  09/03/2015  CLINICAL DATA:  Acute left wrist pain following fall today. Initial encounter. EXAM: LEFT WRIST - COMPLETE 3+ VIEW COMPARISON:  None. FINDINGS: A triquetral fracture of uncertain chronicity identified. No other fracture, subluxation or dislocation identified. Diffuse osteopenia and vascular calcifications are noted. IMPRESSION: Triquetral fracture of uncertain chronicity. Electronically Signed   By: Margarette Canada M.D.   On: 09/03/2015 20:29   Dg Hand Complete Right  09/03/2015  CLINICAL DATA:  Pain after fall. EXAM: RIGHT HAND - COMPLETE 3+ VIEW COMPARISON:  None. FINDINGS: Vascular calcifications are seen. No acute fractures are identified. No dislocations. IMPRESSION: No acute abnormalities. Electronically Signed   By: Dorise Bullion III M.D   On: 09/03/2015 20:28     MDM  She reports she had an MRI for brain one or 2 months ago for vertigo which was negative. On exam vertigo seems peripheral in etiology. Laceration of right middle finger does not require repair  Final diagnoses:  None  Plan continue meclizine as needed. If vertigo continues aspirin M.D. for referral to ENT specialist. Referral  Dr. Burney Gauze hand specialist Diagnosis #1 vertigo #2 closed left wrist fracture #3  laceration of right middle finger #4 contusions multiple sites #5 hypokalemia      Orlie Dakin, MD 09/03/15 2217

## 2015-09-03 NOTE — Discharge Instructions (Signed)
Benign Positional Vertigo Use your meclizine as directed as needed for dizziness. If dizziness continues, ask your primary care physician for referral to an ear nose and throat specialist. Call Dr. Bertis Ruddy office in 2 days to arrange to be seen within the next 1 or 2 weeks. Wear the splint until your follow-up point with Dr. Burney Gauze. Vertigo is the feeling that you or your surroundings are moving when they are not. Benign positional vertigo is the most common form of vertigo. The cause of this condition is not serious (is benign). This condition is triggered by certain movements and positions (is positional). This condition can be dangerous if it occurs while you are doing something that could endanger you or others, such as driving.  CAUSES In many cases, the cause of this condition is not known. It may be caused by a disturbance in an area of the inner ear that helps your brain to sense movement and balance. This disturbance can be caused by a viral infection (labyrinthitis), head injury, or repetitive motion. RISK FACTORS This condition is more likely to develop in:  Women.  People who are 40 years of age or older. SYMPTOMS Symptoms of this condition usually happen when you move your head or your eyes in different directions. Symptoms may start suddenly, and they usually last for less than a minute. Symptoms may include:  Loss of balance and falling.  Feeling like you are spinning or moving.  Feeling like your surroundings are spinning or moving.  Nausea and vomiting.  Blurred vision.  Dizziness.  Involuntary eye movement (nystagmus). Symptoms can be mild and cause only slight annoyance, or they can be severe and interfere with daily life. Episodes of benign positional vertigo may return (recur) over time, and they may be triggered by certain movements. Symptoms may improve over time. DIAGNOSIS This condition is usually diagnosed by medical history and a physical exam of the head,  neck, and ears. You may be referred to a health care provider who specializes in ear, nose, and throat (ENT) problems (otolaryngologist) or a provider who specializes in disorders of the nervous system (neurologist). You may have additional testing, including:  MRI.  A CT scan.  Eye movement tests. Your health care provider may ask you to change positions quickly while he or she watches you for symptoms of benign positional vertigo, such as nystagmus. Eye movement may be tested with an electronystagmogram (ENG), caloric stimulation, the Dix-Hallpike test, or the roll test.  An electroencephalogram (EEG). This records electrical activity in your brain.  Hearing tests. TREATMENT Usually, your health care provider will treat this by moving your head in specific positions to adjust your inner ear back to normal. Surgery may be needed in severe cases, but this is rare. In some cases, benign positional vertigo may resolve on its own in 2-4 weeks. HOME CARE INSTRUCTIONS Safety  Move slowly.Avoid sudden body or head movements.  Avoid driving.  Avoid operating heavy machinery.  Avoid doing any tasks that would be dangerous to you or others if a vertigo episode would occur.  If you have trouble walking or keeping your balance, try using a cane for stability. If you feel dizzy or unstable, sit down right away.  Return to your normal activities as told by your health care provider. Ask your health care provider what activities are safe for you. General Instructions  Take over-the-counter and prescription medicines only as told by your health care provider.  Avoid certain positions or movements as told by  your health care provider.  Drink enough fluid to keep your urine clear or pale yellow.  Keep all follow-up visits as told by your health care provider. This is important. SEEK MEDICAL CARE IF:  You have a fever.  Your condition gets worse or you develop new symptoms.  Your family or  friends notice any behavioral changes.  Your nausea or vomiting gets worse.  You have numbness or a "pins and needles" sensation. SEEK IMMEDIATE MEDICAL CARE IF:  You have difficulty speaking or moving.  You are always dizzy.  You faint.  You develop severe headaches.  You have weakness in your legs or arms.  You have changes in your hearing or vision.  You develop a stiff neck.  You develop sensitivity to light.   This information is not intended to replace advice given to you by your health care provider. Make sure you discuss any questions you have with your health care provider.   Document Released: 12/25/2005 Document Revised: 12/08/2014 Document Reviewed: 07/12/2014 Elsevier Interactive Patient Education [employer].

## 2015-09-03 NOTE — ED Notes (Signed)
Delay in triage for pt to use restroom.

## 2015-09-03 NOTE — ED Notes (Signed)
Pt c/o light headedness/dizzyness this morning with a fall. Pt states she hit her neck/head, wrist and both knees. Pt reports she had some vomiting and diarrhea yesterday. Pt took meclinize before coming.

## 2015-09-05 DIAGNOSIS — M9903 Segmental and somatic dysfunction of lumbar region: Secondary | ICD-10-CM | POA: Diagnosis not present

## 2015-09-05 DIAGNOSIS — M9901 Segmental and somatic dysfunction of cervical region: Secondary | ICD-10-CM | POA: Diagnosis not present

## 2015-09-05 DIAGNOSIS — M47812 Spondylosis without myelopathy or radiculopathy, cervical region: Secondary | ICD-10-CM | POA: Diagnosis not present

## 2015-09-05 DIAGNOSIS — M9902 Segmental and somatic dysfunction of thoracic region: Secondary | ICD-10-CM | POA: Diagnosis not present

## 2015-09-06 DIAGNOSIS — M9903 Segmental and somatic dysfunction of lumbar region: Secondary | ICD-10-CM | POA: Diagnosis not present

## 2015-09-06 DIAGNOSIS — M9901 Segmental and somatic dysfunction of cervical region: Secondary | ICD-10-CM | POA: Diagnosis not present

## 2015-09-06 DIAGNOSIS — M9902 Segmental and somatic dysfunction of thoracic region: Secondary | ICD-10-CM | POA: Diagnosis not present

## 2015-09-06 DIAGNOSIS — M47812 Spondylosis without myelopathy or radiculopathy, cervical region: Secondary | ICD-10-CM | POA: Diagnosis not present

## 2015-09-13 DIAGNOSIS — M9903 Segmental and somatic dysfunction of lumbar region: Secondary | ICD-10-CM | POA: Diagnosis not present

## 2015-09-13 DIAGNOSIS — M9901 Segmental and somatic dysfunction of cervical region: Secondary | ICD-10-CM | POA: Diagnosis not present

## 2015-09-13 DIAGNOSIS — M9902 Segmental and somatic dysfunction of thoracic region: Secondary | ICD-10-CM | POA: Diagnosis not present

## 2015-09-13 DIAGNOSIS — M47812 Spondylosis without myelopathy or radiculopathy, cervical region: Secondary | ICD-10-CM | POA: Diagnosis not present

## 2015-09-16 DIAGNOSIS — M545 Low back pain: Secondary | ICD-10-CM | POA: Diagnosis not present

## 2015-09-26 DIAGNOSIS — S62102D Fracture of unspecified carpal bone, left wrist, subsequent encounter for fracture with routine healing: Secondary | ICD-10-CM | POA: Diagnosis not present

## 2015-09-27 DIAGNOSIS — M545 Low back pain: Secondary | ICD-10-CM | POA: Diagnosis not present

## 2015-10-11 DIAGNOSIS — M25562 Pain in left knee: Secondary | ICD-10-CM | POA: Diagnosis not present

## 2015-10-11 DIAGNOSIS — M25532 Pain in left wrist: Secondary | ICD-10-CM | POA: Diagnosis not present

## 2015-10-11 DIAGNOSIS — M25561 Pain in right knee: Secondary | ICD-10-CM | POA: Diagnosis not present

## 2015-10-26 DIAGNOSIS — Z961 Presence of intraocular lens: Secondary | ICD-10-CM | POA: Diagnosis not present

## 2015-10-26 DIAGNOSIS — H01022 Squamous blepharitis right lower eyelid: Secondary | ICD-10-CM | POA: Diagnosis not present

## 2015-10-26 DIAGNOSIS — H01024 Squamous blepharitis left upper eyelid: Secondary | ICD-10-CM | POA: Diagnosis not present

## 2015-10-26 DIAGNOSIS — H01025 Squamous blepharitis left lower eyelid: Secondary | ICD-10-CM | POA: Diagnosis not present

## 2015-10-26 DIAGNOSIS — H04123 Dry eye syndrome of bilateral lacrimal glands: Secondary | ICD-10-CM | POA: Diagnosis not present

## 2015-10-26 DIAGNOSIS — H01021 Squamous blepharitis right upper eyelid: Secondary | ICD-10-CM | POA: Diagnosis not present

## 2015-10-31 ENCOUNTER — Telehealth: Payer: Self-pay | Admitting: Neurology

## 2015-10-31 ENCOUNTER — Other Ambulatory Visit: Payer: Self-pay

## 2015-10-31 MED ORDER — GABAPENTIN 100 MG PO CAPS: 100.0000 mg | ORAL_CAPSULE | Freq: Every day | ORAL | 3 refills | Status: DC | PRN

## 2015-10-31 NOTE — Telephone Encounter (Signed)
Patient called to inquire if prescription for GABAPENTIN 100 mg capsule, has been sent to [facility] mail order pharmacy. Please call to advise.

## 2015-10-31 NOTE — Telephone Encounter (Signed)
Spoke to pt. She states that she takes 600 mg tabs @ bedtime and 100 mg caps daily as needed. Has plenty of 600 mg left. 100 mg refills sent in to mail order as requested.

## 2015-11-08 DIAGNOSIS — M25532 Pain in left wrist: Secondary | ICD-10-CM | POA: Diagnosis not present

## 2015-11-18 DIAGNOSIS — M545 Low back pain: Secondary | ICD-10-CM | POA: Diagnosis not present

## 2015-12-01 ENCOUNTER — Encounter: Payer: Self-pay | Admitting: Cardiovascular Disease

## 2015-12-01 DIAGNOSIS — R0789 Other chest pain: Secondary | ICD-10-CM | POA: Diagnosis not present

## 2015-12-23 DIAGNOSIS — R222 Localized swelling, mass and lump, trunk: Secondary | ICD-10-CM | POA: Diagnosis not present

## 2016-01-16 DIAGNOSIS — Z23 Encounter for immunization: Secondary | ICD-10-CM | POA: Diagnosis not present

## 2016-02-10 DIAGNOSIS — M545 Low back pain: Secondary | ICD-10-CM | POA: Diagnosis not present

## 2016-02-15 ENCOUNTER — Encounter: Payer: Self-pay | Admitting: Neurology

## 2016-02-15 ENCOUNTER — Ambulatory Visit (INDEPENDENT_AMBULATORY_CARE_PROVIDER_SITE_OTHER): Payer: Medicare Other | Admitting: Neurology

## 2016-02-15 VITALS — BP 175/93 | HR 64 | Ht 62.0 in | Wt 144.8 lb

## 2016-02-15 DIAGNOSIS — R413 Other amnesia: Secondary | ICD-10-CM | POA: Diagnosis not present

## 2016-02-15 DIAGNOSIS — G63 Polyneuropathy in diseases classified elsewhere: Secondary | ICD-10-CM | POA: Diagnosis not present

## 2016-02-15 DIAGNOSIS — R269 Unspecified abnormalities of gait and mobility: Secondary | ICD-10-CM

## 2016-02-15 MED ORDER — GABAPENTIN 600 MG PO TABS: 600.0000 mg | ORAL_TABLET | Freq: Every day | ORAL | Status: DC

## 2016-02-15 NOTE — Progress Notes (Signed)
Reason for visit: Peripheral neuropathy  Morgan Boyer is an 82 y.o. female  History of present illness:  [name] is an 82 year old right-handed white female with a history of a peripheral neuropathy. The patient has also had some discomfort in the low back that has been persistent, she has been followed through orthopedic surgery, she had MRI evaluation of the low back done several months ago. I do not have the report of this study, but the patient indicates that she had relatively severe spinal stenosis at the L4-5 level. The patient has been getting epidural steroid injections with about 2 months of benefit after each injection. The patient does not wish to consider surgery. The patient is sleeping fairly well at night. If she sits or walks too long, the back pain increases. She continues to report some problem with memory and concentration, but this has not worsened much since last seen. She is not on any medications for memory. She is concerned that some of her medications such as gabapentin may be worsening her ability to concentrate.  Past Medical History:  Diagnosis Date  . Abnormality of gait   . Adenomatous colon polyp   . Arthritis   . Back pain   . Depression   . Diverticulosis   . DJD (degenerative joint disease) of knee   . Dyslipidemia   . Esophageal reflux   . H/O hiatal hernia   . Herpes zoster    throat  . History of shingles    left throat 07/2007  . Hyperlipidemia   . Hypertension   . Memory changes 01/14/2013  . Neuromuscular disorder ([place])    peripheral neuropathy  . Nocturnal leg cramps 07/15/2014  . Peripheral neuropathy ([place])   . Polyneuropathy in other diseases classified elsewhere ([place]) 01/14/2013    Past Surgical History:  Procedure Laterality Date  . ABDOMINAL HYSTERECTOMY    . APPENDECTOMY    . COLON SURGERY     2001 diverticulitis with infection ..drained surgically .  Marland Kitchen EYE SURGERY     cat ext bil   . JOINT REPLACEMENT     right  .  KNEE ARTHROSCOPY  11/08/2011  . TONSILLECTOMY    . TOTAL KNEE ARTHROPLASTY  11/07/2011   Procedure: TOTAL KNEE ARTHROPLASTY;  Surgeon: Ninetta Lights, MD;  Location: [facility];  Service: Orthopedics;  Laterality: Left;  . TUBAL LIGATION    . WRIST SURGERY     for skin cancer    Family History  Problem Relation Age of Onset  . Congestive Heart Failure Mother   . Stroke Father 67  . Heart attack Brother 62  . Diabetes Brother     Social history:  reports that she has never smoked. She has never used smokeless tobacco. She reports that she does not drink alcohol or use drugs.    Allergies  Allergen Reactions  . Septra [Sulfamethoxazole-Trimethoprim] Hives  . Lotensin [Benazepril Hcl] Other (See Comments)    NOT KNOWN    Medications:  Prior to Admission medications   Medication Sig Start Date End Date Taking? Authorizing Provider  aspirin 81 MG tablet Take 81 mg by mouth daily.    Historical Provider, MD  b complex vitamins tablet Take 1 tablet by mouth daily.    Historical Provider, MD  baclofen (LIORESAL) 10 MG tablet Take 1 tablet (10 mg total) by mouth at bedtime. 08/10/15   Kathrynn Ducking, MD  Calcium Citrate (CITRACAL PO) Take 1 tablet by mouth 2 (two)  times daily.     Historical Provider, MD  Docusate Calcium (STOOL SOFTENER PO) Take 2 capsules by mouth daily.    Historical Provider, MD  esomeprazole (NEXIUM) 20 MG capsule Take 20 mg by mouth daily at 12 noon.    Historical Provider, MD  gabapentin (NEURONTIN) 100 MG capsule Take 1 capsule (100 mg total) by mouth daily as needed. For pain 10/31/15   Kathrynn Ducking, MD  gabapentin (NEURONTIN) 600 MG tablet Take 1.5 tablets (900 mg total) by mouth at bedtime. 08/10/15   Kathrynn Ducking, MD  hydrocortisone 2.5 % cream Apply 1 application topically daily as needed (rash).  08/22/15   Historical Provider, MD  ibuprofen (ADVIL,MOTRIN) 200 MG tablet Take 400 mg by mouth 3 (three) times daily as needed for headache or moderate pain.      Historical Provider, MD  lisinopril-hydrochlorothiazide (PRINZIDE,ZESTORETIC) 20-12.5 MG per tablet Take 1 tablet by mouth daily.     Historical Provider, MD  meclizine (ANTIVERT) 25 MG tablet Take 12.5-25 mg by mouth 3 (three) times daily as needed for dizziness.    Historical Provider, MD  Polyethyl Glycol-Propyl Glycol (SYSTANE OP) Place 1 drop into both eyes 3 (three) times daily as needed. Dry eye    Historical Provider, MD  simvastatin (ZOCOR) 20 MG tablet Take 20 mg by mouth every evening.    Historical Provider, MD    ROS:  Out of a complete 14 system review of symptoms, the patient complains only of the following symptoms, and all other reviewed systems are negative.  Activity change Hearing loss Leg swelling Back pain Bruising easily Memory loss  Blood pressure (!) 175/93, pulse 64, height 5\' 2"  (1.575 m), weight 144 lb 12 oz (65.7 kg).  Physical Exam  General: The patient is alert and cooperative at the time of the examination.  Skin: No significant peripheral edema is noted.   Neurologic Exam  Mental status: The patient is alert and oriented x 3 at the time of the examination. The patient has apparent normal recent and remote memory, with an apparently normal attention span and concentration ability. Mini-Mental Status Examination done today shows a total score of 30/30.   Cranial nerves: Facial symmetry is present. Speech is normal, no aphasia or dysarthria is noted. Extraocular movements are full. Visual fields are full.  Motor: The patient has good strength in all 4 extremities.  Sensory examination: Soft touch sensation is symmetric on the face, arms, and legs.  Coordination: The patient has good finger-nose-finger and heel-to-shin bilaterally.  Gait and station: The patient has a normal gait. Tandem gait is slightly unsteady. Romberg is negative. No drift is seen.  Reflexes: Deep tendon reflexes are symmetric.   MRI brain 11/18/14:  IMPRESSION: Abnormal  MRI brain (without) demonstrating: 1. Mild periventricular and subcortical foci of non-specific gliosis. 2. Mild perisylvian atrophy. Mild ventriculomegaly on ex vacuo basis.  3. No acute findings.  * MRI scan images were reviewed online. I agree with the written report.    Assessment/Plan:  1. Peripheral neuropathy  2. Lumbosacral spinal stenosis  3. Memory disorder  The memory problems will be followed over time. The patient will be reduced on her gabapentin taking 300 mg in the evening rather than 600 mg. If she finds that this improves her cognitive abilities, she will remain on the lower dose. The patient will follow-up in about 6 months. She is having increasing difficulty with lumbosacral spine pain, she is limited in how far she can walk because  of this.  Jill Alexanders MD 02/15/2016 10:35 AM  [facility] [address] [place], [place] [address]  Phone [phone] Fax [phone]

## 2016-03-01 DIAGNOSIS — E7800 Pure hypercholesterolemia, unspecified: Secondary | ICD-10-CM | POA: Diagnosis not present

## 2016-03-01 DIAGNOSIS — I10 Essential (primary) hypertension: Secondary | ICD-10-CM | POA: Diagnosis not present

## 2016-03-05 DIAGNOSIS — E538 Deficiency of other specified B group vitamins: Secondary | ICD-10-CM | POA: Diagnosis not present

## 2016-03-05 DIAGNOSIS — M069 Rheumatoid arthritis, unspecified: Secondary | ICD-10-CM | POA: Diagnosis not present

## 2016-03-05 DIAGNOSIS — Z1389 Encounter for screening for other disorder: Secondary | ICD-10-CM | POA: Diagnosis not present

## 2016-03-05 DIAGNOSIS — Z0000 Encounter for general adult medical examination without abnormal findings: Secondary | ICD-10-CM | POA: Diagnosis not present

## 2016-03-05 DIAGNOSIS — E7800 Pure hypercholesterolemia, unspecified: Secondary | ICD-10-CM | POA: Diagnosis not present

## 2016-03-05 DIAGNOSIS — M4800 Spinal stenosis, site unspecified: Secondary | ICD-10-CM | POA: Diagnosis not present

## 2016-03-05 DIAGNOSIS — I10 Essential (primary) hypertension: Secondary | ICD-10-CM | POA: Diagnosis not present

## 2016-03-15 DIAGNOSIS — M791 Myalgia: Secondary | ICD-10-CM | POA: Diagnosis not present

## 2016-03-15 DIAGNOSIS — J069 Acute upper respiratory infection, unspecified: Secondary | ICD-10-CM | POA: Diagnosis not present

## 2016-03-22 DIAGNOSIS — J069 Acute upper respiratory infection, unspecified: Secondary | ICD-10-CM | POA: Diagnosis not present

## 2016-04-10 ENCOUNTER — Telehealth: Payer: Self-pay | Admitting: Neurology

## 2016-04-10 MED ORDER — GABAPENTIN 600 MG PO TABS: 600.0000 mg | ORAL_TABLET | Freq: Every day | ORAL | 3 refills | Status: DC

## 2016-04-10 NOTE — Telephone Encounter (Signed)
Patient is requesting refill for gabapentin (NEURONTIN) 600 MG tablet.  Patient wants to make sure it states to take 1 tablet at bed time. Pharmacy- [facility]

## 2016-04-10 NOTE — Addendum Note (Signed)
Addended by: Monte Fantasia on: 04/10/2016 05:27 PM   Modules accepted: Orders

## 2016-04-10 NOTE — Telephone Encounter (Signed)
90 day refill for 600 mg tabs e-scribed to mail order pharmacy per pt's request.

## 2016-04-16 DIAGNOSIS — M545 Low back pain: Secondary | ICD-10-CM | POA: Diagnosis not present

## 2016-05-21 ENCOUNTER — Telehealth: Payer: Self-pay | Admitting: Neurology

## 2016-05-21 MED ORDER — GABAPENTIN 100 MG PO CAPS: 100.0000 mg | ORAL_CAPSULE | Freq: Three times a day (TID) | ORAL | 3 refills | Status: DC

## 2016-05-21 NOTE — Addendum Note (Signed)
Addended by: Margette Fast on: 05/21/2016 05:53 PM   Modules accepted: Orders

## 2016-05-21 NOTE — Telephone Encounter (Signed)
Dr Jannifer Franklin- please advise. You last saw pt on 02/15/16, her next follow-up is on 08/16/16.

## 2016-05-21 NOTE — Telephone Encounter (Signed)
Patient wants to know if she can up her Rx for gabapentin (NEURONTIN) 100 MG capsule. She would like to get a new Rx to take 3 a day for pain. Please call to [facility] on [address].

## 2016-05-21 NOTE — Telephone Encounter (Signed)
I called the patient. She wanted to increase the gabapentin 100 mg capsules to taking 3 a day, I have sent in a prescription for this.

## 2016-06-06 DIAGNOSIS — M5416 Radiculopathy, lumbar region: Secondary | ICD-10-CM | POA: Diagnosis not present

## 2016-06-06 DIAGNOSIS — M545 Low back pain: Secondary | ICD-10-CM | POA: Diagnosis not present

## 2016-06-06 DIAGNOSIS — M48061 Spinal stenosis, lumbar region without neurogenic claudication: Secondary | ICD-10-CM | POA: Diagnosis not present

## 2016-06-08 DIAGNOSIS — J069 Acute upper respiratory infection, unspecified: Secondary | ICD-10-CM | POA: Diagnosis not present

## 2016-06-08 DIAGNOSIS — R7303 Prediabetes: Secondary | ICD-10-CM | POA: Diagnosis not present

## 2016-06-08 DIAGNOSIS — M791 Myalgia: Secondary | ICD-10-CM | POA: Diagnosis not present

## 2016-07-16 DIAGNOSIS — M25512 Pain in left shoulder: Secondary | ICD-10-CM | POA: Diagnosis not present

## 2016-08-06 DIAGNOSIS — M5416 Radiculopathy, lumbar region: Secondary | ICD-10-CM | POA: Diagnosis not present

## 2016-08-06 DIAGNOSIS — M48061 Spinal stenosis, lumbar region without neurogenic claudication: Secondary | ICD-10-CM | POA: Diagnosis not present

## 2016-08-16 ENCOUNTER — Ambulatory Visit: Payer: Medicare Other | Admitting: Neurology

## 2016-08-20 DIAGNOSIS — M4316 Spondylolisthesis, lumbar region: Secondary | ICD-10-CM | POA: Diagnosis not present

## 2016-08-20 DIAGNOSIS — M2548 Effusion, other site: Secondary | ICD-10-CM | POA: Diagnosis not present

## 2016-08-20 DIAGNOSIS — M47816 Spondylosis without myelopathy or radiculopathy, lumbar region: Secondary | ICD-10-CM | POA: Diagnosis not present

## 2016-08-20 DIAGNOSIS — M5126 Other intervertebral disc displacement, lumbar region: Secondary | ICD-10-CM | POA: Diagnosis not present

## 2016-08-20 DIAGNOSIS — M47817 Spondylosis without myelopathy or radiculopathy, lumbosacral region: Secondary | ICD-10-CM | POA: Diagnosis not present

## 2016-08-20 DIAGNOSIS — M5127 Other intervertebral disc displacement, lumbosacral region: Secondary | ICD-10-CM | POA: Diagnosis not present

## 2016-08-30 DIAGNOSIS — M47817 Spondylosis without myelopathy or radiculopathy, lumbosacral region: Secondary | ICD-10-CM | POA: Diagnosis not present

## 2016-08-30 DIAGNOSIS — M545 Low back pain: Secondary | ICD-10-CM | POA: Diagnosis not present

## 2016-08-30 DIAGNOSIS — M48061 Spinal stenosis, lumbar region without neurogenic claudication: Secondary | ICD-10-CM | POA: Diagnosis not present

## 2016-10-04 DIAGNOSIS — R197 Diarrhea, unspecified: Secondary | ICD-10-CM | POA: Diagnosis not present

## 2016-10-09 ENCOUNTER — Other Ambulatory Visit: Payer: Self-pay | Admitting: Neurology

## 2016-10-15 DIAGNOSIS — M545 Low back pain: Secondary | ICD-10-CM | POA: Diagnosis not present

## 2016-10-15 DIAGNOSIS — M47817 Spondylosis without myelopathy or radiculopathy, lumbosacral region: Secondary | ICD-10-CM | POA: Diagnosis not present

## 2016-10-15 DIAGNOSIS — M48061 Spinal stenosis, lumbar region without neurogenic claudication: Secondary | ICD-10-CM | POA: Diagnosis not present

## 2016-10-18 DIAGNOSIS — R634 Abnormal weight loss: Secondary | ICD-10-CM | POA: Diagnosis not present

## 2016-10-18 DIAGNOSIS — M549 Dorsalgia, unspecified: Secondary | ICD-10-CM | POA: Diagnosis not present

## 2016-10-18 DIAGNOSIS — R110 Nausea: Secondary | ICD-10-CM | POA: Diagnosis not present

## 2016-11-20 DIAGNOSIS — M545 Low back pain: Secondary | ICD-10-CM | POA: Diagnosis not present

## 2016-11-20 DIAGNOSIS — M48061 Spinal stenosis, lumbar region without neurogenic claudication: Secondary | ICD-10-CM | POA: Diagnosis not present

## 2016-11-20 DIAGNOSIS — M47817 Spondylosis without myelopathy or radiculopathy, lumbosacral region: Secondary | ICD-10-CM | POA: Diagnosis not present

## 2017-01-14 DIAGNOSIS — Z23 Encounter for immunization: Secondary | ICD-10-CM | POA: Diagnosis not present

## 2017-02-12 DIAGNOSIS — M47816 Spondylosis without myelopathy or radiculopathy, lumbar region: Secondary | ICD-10-CM | POA: Diagnosis not present

## 2017-02-12 DIAGNOSIS — M549 Dorsalgia, unspecified: Secondary | ICD-10-CM | POA: Diagnosis not present

## 2017-02-12 DIAGNOSIS — M546 Pain in thoracic spine: Secondary | ICD-10-CM | POA: Diagnosis not present

## 2017-02-12 DIAGNOSIS — M4316 Spondylolisthesis, lumbar region: Secondary | ICD-10-CM | POA: Diagnosis not present

## 2017-02-12 DIAGNOSIS — M5136 Other intervertebral disc degeneration, lumbar region: Secondary | ICD-10-CM | POA: Diagnosis not present

## 2017-02-12 DIAGNOSIS — M48062 Spinal stenosis, lumbar region with neurogenic claudication: Secondary | ICD-10-CM | POA: Diagnosis not present

## 2017-02-28 ENCOUNTER — Ambulatory Visit (INDEPENDENT_AMBULATORY_CARE_PROVIDER_SITE_OTHER): Payer: Medicare Other | Admitting: Cardiovascular Disease

## 2017-02-28 ENCOUNTER — Encounter: Payer: Self-pay | Admitting: Cardiovascular Disease

## 2017-02-28 VITALS — BP 138/78 | HR 67 | Ht 62.0 in | Wt 133.6 lb

## 2017-02-28 DIAGNOSIS — I340 Nonrheumatic mitral (valve) insufficiency: Secondary | ICD-10-CM

## 2017-02-28 NOTE — Progress Notes (Signed)
Chief Complaint  Patient presents with  . Follow-up    mitral regurgitation     History of Present Illness: 83 yo female with history of HTN, HLD, memory disorder, peripheral neuropathy, OA GERD, depression here today for cardiac follow up. I saw her as a new patient in 2016 for evaluation of dizziness, near syncope.  She has no known cardiac disease. She described episodes of dizziness occurring at rest and associated with nausea and diarrhea. No syncope. She had been seen by Neurology and EEG was ok. MRI Brain with chronic changes but no acute stroke. Carotid dopplers with mild bilateral stenosis. Event monitor with sinus rhythm and 1st degree AV block but no other arrhythmias or high grade heart block. She had no chest pain or SOB. Echo 01/18/15 with normal LV systolic function and moderate MR.   She is here today for follow up. The patient denies any chest pain, dyspnea, palpitations, lower extremity edema, orthopnea, PND, dizziness, near syncope or syncope. She has chronic back pain and leg pain due to spinal stenosis.     Primary Care Physician: Leighton Ruff, MD   Past Medical History:  Diagnosis Date  . Abnormality of gait   . Adenomatous colon polyp   . Arthritis   . Back pain   . Depression   . Diverticulosis   . DJD (degenerative joint disease) of knee   . Dyslipidemia   . Esophageal reflux   . H/O hiatal hernia   . Herpes zoster    throat  . History of shingles    left throat 07/2007  . Hyperlipidemia   . Hypertension   . Memory changes 01/14/2013  . Neuromuscular disorder ([place])    peripheral neuropathy  . Nocturnal leg cramps 07/15/2014  . Peripheral neuropathy   . Polyneuropathy in other diseases classified elsewhere ([place]) 01/14/2013    Past Surgical History:  Procedure Laterality Date  . ABDOMINAL HYSTERECTOMY    . APPENDECTOMY    . COLON SURGERY     2001 diverticulitis with infection ..drained surgically .  Marland Kitchen EYE SURGERY     cat ext bil   .  JOINT REPLACEMENT     right  . KNEE ARTHROSCOPY  11/08/2011  . TONSILLECTOMY    . TOTAL KNEE ARTHROPLASTY  11/07/2011   Procedure: TOTAL KNEE ARTHROPLASTY;  Surgeon: Ninetta Lights, MD;  Location: [facility];  Service: Orthopedics;  Laterality: Left;  . TUBAL LIGATION    . WRIST SURGERY     for skin cancer    Current Outpatient Medications  Medication Sig Dispense Refill  . aspirin 81 MG tablet Take 81 mg by mouth daily.    Marland Kitchen b complex vitamins tablet Take 1 tablet by mouth daily.    . baclofen (LIORESAL) 10 MG tablet TAKE 1 TABLET BY MOUTH ONCE DAILY AT BEDTIME 90 tablet 2  . Calcium Citrate (CITRACAL PO) Take 1 tablet by mouth 2 (two) times daily.     . diclofenac sodium (VOLTAREN) 1 % GEL Apply 1 application topically as directed.    Mariane Baumgarten Calcium (STOOL SOFTENER PO) Take 2 capsules by mouth daily.    Marland Kitchen esomeprazole (NEXIUM) 20 MG capsule Take 20 mg by mouth daily at 12 noon.    . gabapentin (NEURONTIN) 600 MG tablet Take 1 tablet (600 mg total) by mouth at bedtime. 90 tablet 3  . hydrocortisone 2.5 % cream Apply 1 application topically daily as needed (rash).   11  . ibuprofen (ADVIL,MOTRIN) 200 MG  tablet Take 400 mg by mouth 3 (three) times daily as needed for headache or moderate pain.     Marland Kitchen lisinopril-hydrochlorothiazide (PRINZIDE,ZESTORETIC) 20-12.5 MG per tablet Take 1 tablet by mouth daily.     Vladimir Faster Glycol-Propyl Glycol (SYSTANE OP) Place 1 drop into both eyes 3 (three) times daily as needed. Dry eye    . simvastatin (ZOCOR) 20 MG tablet Take 20 mg by mouth every evening.    . traMADol (ULTRAM) 50 MG tablet Take 50 mg by mouth as needed for pain.     No current facility-administered medications for this visit.     Allergies  Allergen Reactions  . Septra [Sulfamethoxazole-Trimethoprim] Hives  . Lotensin [Benazepril Hcl] Other (See Comments)    NOT KNOWN    Social History   Socioeconomic History  . Marital status: Married    Spouse name: Not on file  . Number  of children: 3  . Years of education: hs  . Highest education level: Not on file  Social Needs  . Financial resource strain: Not on file  . Food insecurity - worry: Not on file  . Food insecurity - inability: Not on file  . Transportation needs - medical: Not on file  . Transportation needs - non-medical: Not on file  Occupational History  . Occupation: retired  Tobacco Use  . Smoking status: Never Smoker  . Smokeless tobacco: Never Used  Substance and Sexual Activity  . Alcohol use: No  . Drug use: No  . Sexual activity: No  Other Topics Concern  . Not on file  Social History Narrative   Husband deceased from lung cancer 06/16/13.   Patient is right handed.   Patient drinks 2 cups of caffeine daily.    Family History  Problem Relation Age of Onset  . Congestive Heart Failure Mother   . Stroke Father 47  . Heart attack Brother 31  . Diabetes Brother     Review of Systems:  As stated in the HPI and otherwise negative.   BP 138/78   Pulse 67   Ht 5\' 2"  (1.575 m)   Wt 133 lb 9.6 oz (60.6 kg)   SpO2 95%   BMI 24.44 kg/m   Physical Examination:  General: Well developed, well nourished, NAD  HEENT: OP clear, mucus membranes moist  SKIN: warm, dry. No rashes. Neuro: No focal deficits  Musculoskeletal: Muscle strength 5/5 all ext  Psychiatric: Mood and affect normal  Neck: No JVD, no carotid bruits, no thyromegaly, no lymphadenopathy.  Lungs:Clear bilaterally, no wheezes, rhonci, crackles Cardiovascular: Regular rate and rhythm. Systolic murmur noted. No gallops or rubs. Abdomen:Soft. Bowel sounds present. Non-tender.  Extremities: No lower extremity edema. Pulses are 2 + in the bilateral DP/PT.  Echo 01/18/15:  - Left ventricle: The cavity size was normal. Wall thickness was   increased in a pattern of mild LVH. Systolic function was normal.   The estimated ejection fraction was in the range of 60% to 65%.   Wall motion was normal; there were no regional wall  motion   abnormalities. Doppler parameters are consistent with abnormal   left ventricular relaxation (grade 1 diastolic dysfunction). The   E/e&' ratio is between 8-15, suggesting indeterminate LV filling   pressure. - Mitral valve: Calcified annulus. There was moderate   regurgitation. - Left atrium: The atrium was normal in size. - Tricuspid valve: There was trivial regurgitation. - Inferior vena cava: The vessel was normal in size. The   respirophasic diameter changes  were in the normal range (= 50%),   consistent with normal central venous pressure.  EKG:  EKG is ordered today. The ekg ordered today demonstrates Sinus, 1st degree AV block. PAC. RBBB.   Recent Labs: No results found for requested labs within last 8760 hours.     Wt Readings from Last 3 Encounters:  02/28/17 133 lb 9.6 oz (60.6 kg)  02/15/16 144 lb 12 oz (65.7 kg)  08/10/15 144 lb 8 oz (65.5 kg)     Other studies Reviewed: Additional studies/ records that were reviewed today include: . Review of the above records demonstrates:    Assessment and Plan:   1. Mitral regurgitation: Moderate by echo in October 2016. Systolic murmur on exam. Will repeat echo now.   Current medicines are reviewed at length with the patient today.  The patient does not have concerns regarding medicines.  The following changes have been made:  no change  Labs/ tests ordered today include:   Orders Placed This Encounter  Procedures  . ECHOCARDIOGRAM COMPLETE    Disposition:   FU with me in 12  months  Signed, Lauree Chandler, MD 02/28/2017 12:42 PM    [facility] [place], [place], [place]  [address] Phone: [phone]; Fax: [phone]

## 2017-02-28 NOTE — Patient Instructions (Signed)

## 2017-03-01 NOTE — Addendum Note (Signed)
Addended by: Mendel Ryder on: 03/01/2017 02:58 PM   Modules accepted: Orders

## 2017-03-06 ENCOUNTER — Ambulatory Visit (HOSPITAL_COMMUNITY): Payer: Medicare Other | Attending: Cardiology

## 2017-03-06 ENCOUNTER — Other Ambulatory Visit: Payer: Self-pay

## 2017-03-06 DIAGNOSIS — I080 Rheumatic disorders of both mitral and aortic valves: Secondary | ICD-10-CM | POA: Diagnosis not present

## 2017-03-06 DIAGNOSIS — I119 Hypertensive heart disease without heart failure: Secondary | ICD-10-CM | POA: Insufficient documentation

## 2017-03-06 DIAGNOSIS — I340 Nonrheumatic mitral (valve) insufficiency: Secondary | ICD-10-CM | POA: Diagnosis not present

## 2017-03-06 DIAGNOSIS — I77810 Thoracic aortic ectasia: Secondary | ICD-10-CM | POA: Diagnosis not present

## 2017-03-06 DIAGNOSIS — I272 Pulmonary hypertension, unspecified: Secondary | ICD-10-CM | POA: Diagnosis not present

## 2017-03-06 DIAGNOSIS — E785 Hyperlipidemia, unspecified: Secondary | ICD-10-CM | POA: Diagnosis not present

## 2017-03-06 DIAGNOSIS — Z8249 Family history of ischemic heart disease and other diseases of the circulatory system: Secondary | ICD-10-CM | POA: Diagnosis not present

## 2017-03-06 DIAGNOSIS — I440 Atrioventricular block, first degree: Secondary | ICD-10-CM | POA: Diagnosis not present

## 2017-03-18 DIAGNOSIS — R7303 Prediabetes: Secondary | ICD-10-CM | POA: Diagnosis not present

## 2017-03-18 DIAGNOSIS — E7800 Pure hypercholesterolemia, unspecified: Secondary | ICD-10-CM | POA: Diagnosis not present

## 2017-03-19 DIAGNOSIS — Z0000 Encounter for general adult medical examination without abnormal findings: Secondary | ICD-10-CM | POA: Diagnosis not present

## 2017-03-19 DIAGNOSIS — M069 Rheumatoid arthritis, unspecified: Secondary | ICD-10-CM | POA: Diagnosis not present

## 2017-03-19 DIAGNOSIS — R609 Edema, unspecified: Secondary | ICD-10-CM | POA: Diagnosis not present

## 2017-03-19 DIAGNOSIS — E7800 Pure hypercholesterolemia, unspecified: Secondary | ICD-10-CM | POA: Diagnosis not present

## 2017-03-19 DIAGNOSIS — F339 Major depressive disorder, recurrent, unspecified: Secondary | ICD-10-CM | POA: Diagnosis not present

## 2017-03-19 DIAGNOSIS — K219 Gastro-esophageal reflux disease without esophagitis: Secondary | ICD-10-CM | POA: Diagnosis not present

## 2017-03-19 DIAGNOSIS — M4800 Spinal stenosis, site unspecified: Secondary | ICD-10-CM | POA: Diagnosis not present

## 2017-03-19 DIAGNOSIS — E2839 Other primary ovarian failure: Secondary | ICD-10-CM | POA: Diagnosis not present

## 2017-03-19 DIAGNOSIS — Z1389 Encounter for screening for other disorder: Secondary | ICD-10-CM | POA: Diagnosis not present

## 2017-03-19 DIAGNOSIS — R7303 Prediabetes: Secondary | ICD-10-CM | POA: Diagnosis not present

## 2017-03-19 DIAGNOSIS — I10 Essential (primary) hypertension: Secondary | ICD-10-CM | POA: Diagnosis not present

## 2017-03-19 DIAGNOSIS — I340 Nonrheumatic mitral (valve) insufficiency: Secondary | ICD-10-CM | POA: Diagnosis not present

## 2017-04-15 DIAGNOSIS — M4800 Spinal stenosis, site unspecified: Secondary | ICD-10-CM | POA: Diagnosis not present

## 2017-05-18 ENCOUNTER — Other Ambulatory Visit: Payer: Self-pay | Admitting: Neurology

## 2017-05-20 ENCOUNTER — Other Ambulatory Visit: Payer: Self-pay | Admitting: Neurology

## 2017-05-20 DIAGNOSIS — B351 Tinea unguium: Secondary | ICD-10-CM | POA: Diagnosis not present

## 2017-05-23 ENCOUNTER — Telehealth: Payer: Self-pay | Admitting: Neurology

## 2017-05-23 NOTE — Telephone Encounter (Signed)
Pt has called for a refill for her gabapentin (NEURONTIN) 600 MG tablet she has not been seen since 2017. She has scheduled an OV for 08-27 and is on wait list, she would very much like to be called if she can be seen earlier and if a refill Rx can be called in until her OV please call.  Pt still uses  [facility], [place] [phone] (Phone) [phone] (Fax)

## 2017-05-24 MED ORDER — GABAPENTIN 600 MG PO TABS: 600.0000 mg | ORAL_TABLET | Freq: Every day | ORAL | 0 refills | Status: DC

## 2017-05-24 NOTE — Telephone Encounter (Signed)
The prescription for gabapentin was given.

## 2017-05-24 NOTE — Addendum Note (Signed)
Addended by: Kathrynn Ducking on: 05/24/2017 10:29 AM   Modules accepted: Orders

## 2017-05-28 DIAGNOSIS — Z780 Asymptomatic menopausal state: Secondary | ICD-10-CM | POA: Diagnosis not present

## 2017-05-28 DIAGNOSIS — E2839 Other primary ovarian failure: Secondary | ICD-10-CM | POA: Diagnosis not present

## 2017-05-29 MED ORDER — GABAPENTIN 600 MG PO TABS: 600.0000 mg | ORAL_TABLET | Freq: Every day | ORAL | 0 refills | Status: DC

## 2017-05-29 NOTE — Telephone Encounter (Signed)
Pt called back and was able to accept appt for tomorrow at 230pm with Dr. Jannifer Franklin. Her sister will bring her to appt. She was last seen in 2017.

## 2017-05-29 NOTE — Telephone Encounter (Signed)
Pt is calling re: the Gabapentin, she states due to a change in her insurance her prescriptions need to be sent to  [facility], [place] [phone] (Phone) [phone] (Fax)   Pt is asking if the prescription for  gabapentin (NEURONTIN) 600 MG tablet  can be sent to the above mentioned pharmacy

## 2017-05-29 NOTE — Telephone Encounter (Signed)
Called pt. Advised we will resend rx to [facility] for her. Got sent to [facility].  I cancelled rx gabapentin 600mg  tablet sent to [facility]. Spoke with Gerald Stabs.  I e-scribed rx to [facility].  Also asked if she can come tomorrow at 230pm for a follow up with Dr. Jannifer Franklin. She is going to make a call to see if someone can bring her and will call back before 5pm today to let us know.

## 2017-05-29 NOTE — Addendum Note (Signed)
Addended by: Hope Pigeon on: 05/29/2017 04:09 PM   Modules accepted: Orders

## 2017-05-30 ENCOUNTER — Encounter: Payer: Self-pay | Admitting: Neurology

## 2017-05-30 ENCOUNTER — Ambulatory Visit (INDEPENDENT_AMBULATORY_CARE_PROVIDER_SITE_OTHER): Payer: Medicare Other | Admitting: Neurology

## 2017-05-30 VITALS — BP 144/84 | HR 69 | Ht 62.0 in | Wt 135.5 lb

## 2017-05-30 DIAGNOSIS — G4762 Sleep related leg cramps: Secondary | ICD-10-CM

## 2017-05-30 DIAGNOSIS — G63 Polyneuropathy in diseases classified elsewhere: Secondary | ICD-10-CM | POA: Diagnosis not present

## 2017-05-30 DIAGNOSIS — R269 Unspecified abnormalities of gait and mobility: Secondary | ICD-10-CM

## 2017-05-30 DIAGNOSIS — R413 Other amnesia: Secondary | ICD-10-CM | POA: Diagnosis not present

## 2017-05-30 NOTE — Progress Notes (Signed)
Reason for visit: Peripheral neuropathy  Morgan Boyer is an 85 y.o. female  History of present illness:  Morgan Boyer is an 84 year old right-handed white female with a history of a peripheral neuropathy.  The patient does have some mild gait instability, she has not had any falls since last seen.  She does have a 3 wheel walker that she uses in the home environment.  The patient has been followed through her orthopedic surgeon for severe lumbosacral spinal stenosis at the L4-5 level.  The patient has significant facet joint arthritis at the L5-S1 level as well.  The patient is not sure she wants to have surgery, she is hesitant to make this decision.  On the other hand, she has had significant pain and she is limited in her level physical activity because of the back pain.  The patient is on gabapentin 600 mg at night.  She takes baclofen at night for her leg cramps which seems to help.  The patient does have a mild memory disturbance but this has not worsened since last seen.  She returns to this office for an evaluation.  Past Medical History:  Diagnosis Date  . Abnormality of gait   . Adenomatous colon polyp   . Arthritis   . Back pain   . Depression   . Diverticulosis   . DJD (degenerative joint disease) of knee   . Dyslipidemia   . Esophageal reflux   . H/O hiatal hernia   . Herpes zoster    throat  . History of shingles    left throat 07/2007  . Hyperlipidemia   . Hypertension   . Memory changes 01/14/2013  . Neuromuscular disorder ([place])    peripheral neuropathy  . Nocturnal leg cramps 07/15/2014  . Peripheral neuropathy   . Polyneuropathy in other diseases classified elsewhere ([place]) 01/14/2013    Past Surgical History:  Procedure Laterality Date  . ABDOMINAL HYSTERECTOMY    . APPENDECTOMY    . COLON SURGERY     2001 diverticulitis with infection ..drained surgically .  Marland Kitchen EYE SURGERY     cat ext bil   . JOINT REPLACEMENT     right  . KNEE ARTHROSCOPY  11/08/2011   . TONSILLECTOMY    . TOTAL KNEE ARTHROPLASTY  11/07/2011   Procedure: TOTAL KNEE ARTHROPLASTY;  Surgeon: Ninetta Lights, MD;  Location: [facility];  Service: Orthopedics;  Laterality: Left;  . TUBAL LIGATION    . WRIST SURGERY     for skin cancer    Family History  Problem Relation Age of Onset  . Congestive Heart Failure Mother   . Stroke Father 82  . Heart attack Brother 32  . Diabetes Brother     Social history:  reports that  has never smoked. she has never used smokeless tobacco. She reports that she does not drink alcohol or use drugs.    Allergies  Allergen Reactions  . Septra [Sulfamethoxazole-Trimethoprim] Hives  . Lotensin [Benazepril Hcl] Other (See Comments)    NOT KNOWN    Medications:  Prior to Admission medications   Medication Sig Start Date End Date Taking? Authorizing Provider  aspirin 81 MG tablet Take 81 mg by mouth daily.   Yes [provider]  b complex vitamins tablet Take 1 tablet by mouth daily.   Yes [provider]  baclofen (LIORESAL) 10 MG tablet TAKE 1 TABLET BY MOUTH ONCE DAILY AT BEDTIME 10/09/16  Yes Kathrynn Ducking, MD  Calcium Citrate (  CITRACAL PO) Take 1 tablet by mouth 2 (two) times daily.    Yes [provider]  diclofenac sodium (VOLTAREN) 1 % GEL Apply 1 application topically as directed. 02/19/17  Yes [provider]  esomeprazole (NEXIUM) 20 MG capsule Take 20 mg by mouth daily at 12 noon.   Yes [provider]  gabapentin (NEURONTIN) 600 MG tablet Take 1 tablet (600 mg total) by mouth at bedtime. 05/29/17  Yes Kathrynn Ducking, MD  hydrocortisone 2.5 % cream Apply 1 application topically daily as needed (rash).  08/22/15  Yes [provider]  ibuprofen (ADVIL,MOTRIN) 200 MG tablet Take 400 mg by mouth 3 (three) times daily as needed for headache or moderate pain.    Yes [provider]  lisinopril-hydrochlorothiazide (PRINZIDE,ZESTORETIC) 20-12.5 MG per tablet Take 1 tablet by  mouth daily.    Yes [provider]  Polyethyl Glycol-Propyl Glycol (SYSTANE OP) Place 1 drop into both eyes 3 (three) times daily as needed. Dry eye   Yes [provider]  simvastatin (ZOCOR) 20 MG tablet Take 20 mg by mouth every evening.   Yes [provider]  traMADol (ULTRAM) 50 MG tablet Take 50 mg by mouth as needed for pain. 02/15/17  Yes [provider]    ROS:  Out of a complete 14 system review of symptoms, the patient complains only of the following symptoms, and all other reviewed systems are negative.  Decreased weight Low back pain  Blood pressure (!) 144/84, pulse 69, height 5\' 2"  (1.575 m), weight 135 lb 8 oz (61.5 kg).  Physical Exam  General: The patient is alert and cooperative at the time of the examination.  Skin: No significant peripheral edema is noted.   Neurologic Exam  Mental status: The patient is alert and oriented x 3 at the time of the examination. The patient has apparent normal recent and remote memory, with an apparently normal attention span and concentration ability.  The Mini-Mental status examination done today shows a total score of 30/30.   Cranial nerves: Facial symmetry is present. Speech is normal, no aphasia or dysarthria is noted. Extraocular movements are full. Visual fields are full.  Motor: The patient has good strength in all 4 extremities.  Sensory examination: Soft touch sensation is symmetric on the face, arms, and legs.  Coordination: The patient has good finger-nose-finger and heel-to-shin bilaterally.  The patient appears to have some apraxia with use of the extremities.  Gait and station: The patient has a normal gait. Tandem gait is unsteady. Romberg is negative. No drift is seen.  Reflexes: Deep tendon reflexes are symmetric.   MRI lumbar 08/21/16:  IMPRESSION:  1. Similar appearance of the multilevel lumbar spondylosis with grade 1 anterolisthesis at L4-L5. 2. Severe central  canal stenosis and moderate bilateral foraminal stenosis at L4-5. 3. Mild bilateral foraminal stenosis at L5-S1. 4. Facet arthropathy at L4-L5 and L5-S1 with bilateral facet joint effusions. The presence of facet joint effusions could indicate a component of instability. Consider flexion-extension plain films.   Assessment/Plan:  1.  Peripheral neuropathy  2.  Lumbosacral spinal stenosis, L4-5 level  3.  Nocturnal leg cramps  The patient needs to make a decision whether she wants to have surgery for the low back.  Epidural steroid injections have not helped her.  The patient will continue the gabapentin for her peripheral neuropathy.  She is gaining benefit with a nocturnal leg cramps only get baclofen 10 mg at night, she will continue the medication.  She will follow-up here in about 6 months.   Jill Alexanders MD 05/30/2017 2:49 PM  [facility] [address] [place], [place] [address]  Phone [phone] Fax [phone]

## 2017-06-11 ENCOUNTER — Telehealth: Payer: Self-pay

## 2017-06-11 NOTE — Telephone Encounter (Signed)
   [facility] Pre-operative Risk Assessment    Request for surgical clearance:  1. What type of surgery is being performed? Posterior Lumbar Fusion   2. When is this surgery scheduled? TBD after clearance   3. What type of clearance is required (medical clearance vs. Pharmacy clearance to hold med vs. Both)? Pharmacy clearance to hold med  4. Are there any medications that need to be held prior to surgery and how long? Aspirin   5. Practice name and name of physician performing surgery?  [facility] - Dr. Jovita Gamma   6. What is your office phone and fax number? Phone #: [phone] ext: [phone] Fax #: [phone] attention Nikki   7. Anesthesia type (None, local, MAC, general) ? None listed   Jacinta Shoe 06/11/2017, 10:57 AM  _________________________________________________________________   (provider comments below)

## 2017-06-11 NOTE — Telephone Encounter (Signed)
Dr. Angelena Form pt needs posterior lumbar fusion, wanted to get your input to clear.  And can she hold asprin for procedure?  Thanks.

## 2017-06-12 NOTE — Telephone Encounter (Signed)
Ok to hold ASA and proceed with surgery.   Lauree Chandler

## 2017-06-20 ENCOUNTER — Other Ambulatory Visit: Payer: Self-pay | Admitting: Neurosurgery

## 2017-06-20 DIAGNOSIS — M4316 Spondylolisthesis, lumbar region: Secondary | ICD-10-CM | POA: Diagnosis not present

## 2017-06-20 DIAGNOSIS — M48062 Spinal stenosis, lumbar region with neurogenic claudication: Secondary | ICD-10-CM | POA: Diagnosis not present

## 2017-06-20 DIAGNOSIS — Z6825 Body mass index (BMI) 25.0-25.9, adult: Secondary | ICD-10-CM | POA: Diagnosis not present

## 2017-06-20 DIAGNOSIS — M5136 Other intervertebral disc degeneration, lumbar region: Secondary | ICD-10-CM | POA: Diagnosis not present

## 2017-06-20 DIAGNOSIS — M47816 Spondylosis without myelopathy or radiculopathy, lumbar region: Secondary | ICD-10-CM | POA: Diagnosis not present

## 2017-06-20 DIAGNOSIS — I10 Essential (primary) hypertension: Secondary | ICD-10-CM | POA: Diagnosis not present

## 2017-06-24 DIAGNOSIS — L309 Dermatitis, unspecified: Secondary | ICD-10-CM | POA: Diagnosis not present

## 2017-06-28 NOTE — Pre-Procedure Instructions (Signed)
TEARRA OUK  06/28/2017      [facility] [place], [place] - [address] [place] [address] Phone: [phone] Fax: [phone]    Your procedure is scheduled on April 3  Report to [place] at [place].M.  Call this number if you have problems the morning of surgery:  [phone]   Remember:  Do not eat food or drink liquids after midnight.  Continue all medications as directed by your physician except follow these medication instructions before surgery below   Take these medicines the morning of surgery with A SIP OF WATER  esomeprazole (NEXIUM) terbinafine (LAMISIL) traMADol (ULTRAM)   7 days prior to surgery STOP taking any Aspirin(unless otherwise instructed by your surgeon), Aleve, Naproxen, Ibuprofen, Motrin, Advil, Goody's, BC's, all herbal medications, fish oil, and all vitamins  Follow your doctors instructions regarding your Aspirin.  If no instructions were given by your doctor, then you will need to call the prescribing office office to get instructions.      Do not wear jewelry, make-up or nail polish.  Do not wear lotions, powders, or perfumes, or deodorant.  Do not shave 48 hours prior to surgery.    Do not bring valuables to the hospital.  [facility] is not responsible for any belongings or valuables.  Contacts, dentures or bridgework may not be worn into surgery.  Leave your suitcase in the car.  After surgery it may be brought to your room.  For patients admitted to the hospital, discharge time will be determined by your treatment team.  Patients discharged the day of surgery will not be allowed to drive home.    Special instructions:   [facility]- Preparing For Surgery  Before surgery, you can play an important role. Because skin is not sterile, your skin needs to be as free of germs as possible. You can reduce the number of germs on your skin by washing with CHG  (chlorahexidine gluconate) Soap before surgery.  CHG is an antiseptic cleaner which kills germs and bonds with the skin to continue killing germs even after washing.  Please do not use if you have an allergy to CHG or antibacterial soaps. If your skin becomes reddened/irritated stop using the CHG.  Do not shave (including legs and underarms) for at least 48 hours prior to first CHG shower. It is OK to shave your face.  Please follow these instructions carefully.   1. Shower the NIGHT BEFORE SURGERY and the MORNING OF SURGERY with CHG.   2. If you chose to wash your hair, wash your hair first as usual with your normal shampoo.  3. After you shampoo, rinse your hair and body thoroughly to remove the shampoo.  4. Use CHG as you would any other liquid soap. You can apply CHG directly to the skin and wash gently with a scrungie or a clean washcloth.   5. Apply the CHG Soap to your body ONLY FROM THE NECK DOWN.  Do not use on open wounds or open sores. Avoid contact with your eyes, ears, mouth and genitals (private parts). Wash Face and genitals (private parts)  with your normal soap.  6. Wash thoroughly, paying special attention to the area where your surgery will be performed.  7. Thoroughly rinse your body with warm water from the neck down.  8. DO NOT shower/wash with your normal soap after using and rinsing off the CHG Soap.  9.  Pat yourself dry with a CLEAN TOWEL.  10. Wear CLEAN PAJAMAS to bed the night before surgery, wear comfortable clothes the morning of surgery  11. Place CLEAN SHEETS on your bed the night of your first shower and DO NOT SLEEP WITH PETS.    Day of Surgery: Do not apply any deodorants/lotions. Please wear clean clothes to the hospital/surgery center.      Please read over the following fact sheets that you were given.

## 2017-07-01 ENCOUNTER — Other Ambulatory Visit: Payer: Self-pay

## 2017-07-01 ENCOUNTER — Encounter (HOSPITAL_COMMUNITY)
Admission: RE | Admit: 2017-07-01 | Discharge: 2017-07-01 | Disposition: A | Discharge status: Home / Self Care | Payer: Medicare Other | Source: Ambulatory Visit | Attending: Neurosurgery | Admitting: Neurosurgery

## 2017-07-01 ENCOUNTER — Encounter (HOSPITAL_COMMUNITY): Payer: Self-pay

## 2017-07-01 HISTORY — DX: Malignant (primary) neoplasm, unspecified: C80.1

## 2017-07-01 HISTORY — PX: BACK SURGERY: SHX140

## 2017-07-01 HISTORY — DX: Cardiac murmur, unspecified: R01.1

## 2017-07-01 LAB — CBC
HCT: 38.1 % (ref 36.0–46.0)
HEMOGLOBIN: 12.3 g/dL (ref 12.0–15.0)
MCH: 27.8 pg (ref 26.0–34.0)
MCHC: 32.3 g/dL (ref 30.0–36.0)
MCV: 86.2 fL (ref 78.0–100.0)
Platelets: 173 10*3/uL (ref 150–400)
RBC: 4.42 MIL/uL (ref 3.87–5.11)
RDW: 14 % (ref 11.5–15.5)
WBC: 6.1 10*3/uL (ref 4.0–10.5)

## 2017-07-01 LAB — BASIC METABOLIC PANEL
ANION GAP: 8 (ref 5–15)
BUN: 12 mg/dL (ref 6–20)
CHLORIDE: 103 mmol/L (ref 101–111)
CO2: 28 mmol/L (ref 22–32)
Calcium: 9.4 mg/dL (ref 8.9–10.3)
Creatinine, Ser: 0.83 mg/dL (ref 0.44–1.00)
GFR calc non Af Amer: >60 mL/min (ref >60)
Glucose, Bld: 92 mg/dL (ref 65–99)
Potassium: 3.9 mmol/L (ref 3.5–5.1)
Sodium: 139 mmol/L (ref 135–145)

## 2017-07-01 LAB — TYPE AND SCREEN
ABO/RH(D): A POS
Antibody Screen: NEGATIVE

## 2017-07-01 LAB — SURGICAL PCR SCREEN
MRSA, PCR: NEGATIVE
Staphylococcus aureus: NEGATIVE

## 2017-07-03 ENCOUNTER — Encounter (HOSPITAL_COMMUNITY): Payer: Self-pay

## 2017-07-03 ENCOUNTER — Inpatient Hospital Stay (HOSPITAL_COMMUNITY)
Admission: RE | Disposition: A | Discharge status: Home / Self Care | Payer: Self-pay | Source: Ambulatory Visit | Attending: Neurosurgery

## 2017-07-03 ENCOUNTER — Inpatient Hospital Stay (HOSPITAL_COMMUNITY): Payer: Medicare Other

## 2017-07-03 ENCOUNTER — Inpatient Hospital Stay (HOSPITAL_COMMUNITY): Payer: Medicare Other | Admitting: Certified Registered Nurse Anesthetist

## 2017-07-03 ENCOUNTER — Inpatient Hospital Stay (HOSPITAL_COMMUNITY)
Admission: RE | Admit: 2017-07-03 | Discharge: 2017-07-04 | DRG: 455 | Disposition: A | Discharge status: Home / Self Care | Payer: Medicare Other | Source: Ambulatory Visit | Attending: Neurosurgery | Admitting: Neurosurgery

## 2017-07-03 DIAGNOSIS — Z85828 Personal history of other malignant neoplasm of skin: Secondary | ICD-10-CM

## 2017-07-03 DIAGNOSIS — K449 Diaphragmatic hernia without obstruction or gangrene: Secondary | ICD-10-CM | POA: Diagnosis present

## 2017-07-03 DIAGNOSIS — Z888 Allergy status to other drugs, medicaments and biological substances status: Secondary | ICD-10-CM

## 2017-07-03 DIAGNOSIS — M5136 Other intervertebral disc degeneration, lumbar region: Secondary | ICD-10-CM | POA: Diagnosis not present

## 2017-07-03 DIAGNOSIS — Z79891 Long term (current) use of opiate analgesic: Secondary | ICD-10-CM

## 2017-07-03 DIAGNOSIS — Z90710 Acquired absence of both cervix and uterus: Secondary | ICD-10-CM

## 2017-07-03 DIAGNOSIS — Z96653 Presence of artificial knee joint, bilateral: Secondary | ICD-10-CM | POA: Diagnosis present

## 2017-07-03 DIAGNOSIS — Z7982 Long term (current) use of aspirin: Secondary | ICD-10-CM | POA: Diagnosis not present

## 2017-07-03 DIAGNOSIS — M48062 Spinal stenosis, lumbar region with neurogenic claudication: Secondary | ICD-10-CM | POA: Diagnosis not present

## 2017-07-03 DIAGNOSIS — G629 Polyneuropathy, unspecified: Secondary | ICD-10-CM | POA: Diagnosis present

## 2017-07-03 DIAGNOSIS — M4316 Spondylolisthesis, lumbar region: Secondary | ICD-10-CM | POA: Diagnosis present

## 2017-07-03 DIAGNOSIS — F329 Major depressive disorder, single episode, unspecified: Secondary | ICD-10-CM | POA: Diagnosis present

## 2017-07-03 DIAGNOSIS — E785 Hyperlipidemia, unspecified: Secondary | ICD-10-CM | POA: Diagnosis not present

## 2017-07-03 DIAGNOSIS — G6289 Other specified polyneuropathies: Secondary | ICD-10-CM | POA: Diagnosis not present

## 2017-07-03 DIAGNOSIS — R269 Unspecified abnormalities of gait and mobility: Secondary | ICD-10-CM | POA: Diagnosis not present

## 2017-07-03 DIAGNOSIS — K219 Gastro-esophageal reflux disease without esophagitis: Secondary | ICD-10-CM | POA: Diagnosis not present

## 2017-07-03 DIAGNOSIS — Z79899 Other long term (current) drug therapy: Secondary | ICD-10-CM

## 2017-07-03 DIAGNOSIS — I10 Essential (primary) hypertension: Secondary | ICD-10-CM | POA: Diagnosis present

## 2017-07-03 DIAGNOSIS — M4326 Fusion of spine, lumbar region: Secondary | ICD-10-CM | POA: Diagnosis not present

## 2017-07-03 DIAGNOSIS — Z8249 Family history of ischemic heart disease and other diseases of the circulatory system: Secondary | ICD-10-CM

## 2017-07-03 DIAGNOSIS — Z419 Encounter for procedure for purposes other than remedying health state, unspecified: Secondary | ICD-10-CM

## 2017-07-03 DIAGNOSIS — M47816 Spondylosis without myelopathy or radiculopathy, lumbar region: Secondary | ICD-10-CM | POA: Diagnosis not present

## 2017-07-03 SURGERY — POSTERIOR LUMBAR FUSION 1 LEVEL
Anesthesia: General | Site: Spine Lumbar

## 2017-07-03 MED ORDER — HYDROCODONE-ACETAMINOPHEN 5-325 MG PO TABS
1.0000 | ORAL_TABLET | ORAL | Status: DC | PRN
  Administered 2017-07-03: 1 via ORAL
  Filled 2017-07-03: qty 1

## 2017-07-03 MED ORDER — FENTANYL CITRATE (PF) 100 MCG/2ML IJ SOLN
INTRAMUSCULAR | Status: DC | PRN
  Administered 2017-07-03: 25 ug via INTRAVENOUS
  Administered 2017-07-03 (×4): 50 ug via INTRAVENOUS
  Administered 2017-07-03: 25 ug via INTRAVENOUS

## 2017-07-03 MED ORDER — FENTANYL CITRATE (PF) 100 MCG/2ML IJ SOLN: 25.0000 ug | INTRAMUSCULAR | Status: DC | PRN

## 2017-07-03 MED ORDER — ROCURONIUM BROMIDE 10 MG/ML (PF) SYRINGE
PREFILLED_SYRINGE | INTRAVENOUS | Status: AC
  Filled 2017-07-03: qty 5

## 2017-07-03 MED ORDER — KETOROLAC TROMETHAMINE 30 MG/ML IJ SOLN: 15.0000 mg | Freq: Once | INTRAMUSCULAR | Status: DC

## 2017-07-03 MED ORDER — PHENOL 1.4 % MT LIQD: 1.0000 | OROMUCOSAL | Status: DC | PRN

## 2017-07-03 MED ORDER — LIDOCAINE-EPINEPHRINE 1 %-1:100000 IJ SOLN
INTRAMUSCULAR | Status: DC | PRN
  Administered 2017-07-03: 15 mL

## 2017-07-03 MED ORDER — ALUM & MAG HYDROXIDE-SIMETH 200-200-20 MG/5ML PO SUSP: 30.0000 mL | Freq: Four times a day (QID) | ORAL | Status: DC | PRN

## 2017-07-03 MED ORDER — CHLORHEXIDINE GLUCONATE CLOTH 2 % EX PADS: 6.0000 | MEDICATED_PAD | Freq: Once | CUTANEOUS | Status: DC

## 2017-07-03 MED ORDER — DEXAMETHASONE SODIUM PHOSPHATE 10 MG/ML IJ SOLN
INTRAMUSCULAR | Status: DC | PRN
  Administered 2017-07-03: 10 mg via INTRAVENOUS

## 2017-07-03 MED ORDER — PROPOFOL 10 MG/ML IV BOLUS
INTRAVENOUS | Status: DC | PRN
  Administered 2017-07-03: 100 mg via INTRAVENOUS

## 2017-07-03 MED ORDER — TERBINAFINE HCL 250 MG PO TABS
250.0000 mg | ORAL_TABLET | Freq: Every day | ORAL | Status: DC
  Filled 2017-07-03 (×2): qty 1

## 2017-07-03 MED ORDER — CEFAZOLIN SODIUM 1 G IJ SOLR
INTRAMUSCULAR | Status: AC
  Filled 2017-07-03: qty 20

## 2017-07-03 MED ORDER — ACETAMINOPHEN 650 MG RE SUPP: 650.0000 mg | RECTAL | Status: DC | PRN

## 2017-07-03 MED ORDER — ROCURONIUM BROMIDE 50 MG/5ML IV SOSY
PREFILLED_SYRINGE | INTRAVENOUS | Status: DC | PRN
  Administered 2017-07-03: 50 mg via INTRAVENOUS
  Administered 2017-07-03: 10 mg via INTRAVENOUS
  Administered 2017-07-03: 20 mg via INTRAVENOUS
  Administered 2017-07-03: 10 mg via INTRAVENOUS

## 2017-07-03 MED ORDER — SUGAMMADEX SODIUM 200 MG/2ML IV SOLN
INTRAVENOUS | Status: DC | PRN
  Administered 2017-07-03: 200 mg via INTRAVENOUS

## 2017-07-03 MED ORDER — EPHEDRINE 5 MG/ML INJ
INTRAVENOUS | Status: AC
  Filled 2017-07-03: qty 10

## 2017-07-03 MED ORDER — SUGAMMADEX SODIUM 200 MG/2ML IV SOLN
INTRAVENOUS | Status: AC
  Filled 2017-07-03: qty 2

## 2017-07-03 MED ORDER — PROPOFOL 10 MG/ML IV BOLUS
INTRAVENOUS | Status: AC
  Filled 2017-07-03: qty 20

## 2017-07-03 MED ORDER — LACTATED RINGERS IV SOLN
INTRAVENOUS | Status: DC | PRN
  Administered 2017-07-03 (×2): via INTRAVENOUS

## 2017-07-03 MED ORDER — OXYCODONE HCL 5 MG/5ML PO SOLN: 5.0000 mg | Freq: Once | ORAL | Status: DC | PRN

## 2017-07-03 MED ORDER — KETOROLAC TROMETHAMINE 15 MG/ML IJ SOLN
INTRAMUSCULAR | Status: AC
  Administered 2017-07-03: 15 mg
  Filled 2017-07-03: qty 1

## 2017-07-03 MED ORDER — HYDROXYZINE HCL 50 MG PO TABS
50.0000 mg | ORAL_TABLET | ORAL | Status: DC | PRN
  Filled 2017-07-03: qty 1

## 2017-07-03 MED ORDER — MORPHINE SULFATE (PF) 4 MG/ML IV SOLN: 4.0000 mg | INTRAVENOUS | Status: DC | PRN

## 2017-07-03 MED ORDER — FLEET ENEMA 7-19 GM/118ML RE ENEM: 1.0000 | ENEMA | Freq: Once | RECTAL | Status: DC | PRN

## 2017-07-03 MED ORDER — SODIUM CHLORIDE 0.9 % IR SOLN
Status: DC | PRN
  Administered 2017-07-03 (×2): 500 mL

## 2017-07-03 MED ORDER — ACETAMINOPHEN 325 MG PO TABS: 650.0000 mg | ORAL_TABLET | ORAL | Status: DC | PRN

## 2017-07-03 MED ORDER — ACETAMINOPHEN 10 MG/ML IV SOLN
INTRAVENOUS | Status: DC | PRN
  Administered 2017-07-03: 1000 mg via INTRAVENOUS

## 2017-07-03 MED ORDER — BUPIVACAINE HCL (PF) 0.5 % IJ SOLN
INTRAMUSCULAR | Status: DC | PRN
  Administered 2017-07-03: 15 mL

## 2017-07-03 MED ORDER — FENTANYL CITRATE (PF) 250 MCG/5ML IJ SOLN
INTRAMUSCULAR | Status: AC
  Filled 2017-07-03: qty 5

## 2017-07-03 MED ORDER — THROMBIN 5000 UNITS EX SOLR
CUTANEOUS | Status: AC
  Filled 2017-07-03: qty 5000

## 2017-07-03 MED ORDER — LISINOPRIL-HYDROCHLOROTHIAZIDE 20-12.5 MG PO TABS: 1.0000 | ORAL_TABLET | Freq: Every day | ORAL | Status: DC

## 2017-07-03 MED ORDER — KCL IN DEXTROSE-NACL 20-5-0.45 MEQ/L-%-% IV SOLN
INTRAVENOUS | Status: AC
  Administered 2017-07-03: 100 mL/h via INTRAVENOUS
  Filled 2017-07-03: qty 1000

## 2017-07-03 MED ORDER — ONDANSETRON HCL 4 MG/2ML IJ SOLN
INTRAMUSCULAR | Status: DC | PRN
  Administered 2017-07-03: 4 mg via INTRAVENOUS

## 2017-07-03 MED ORDER — ONDANSETRON HCL 4 MG/2ML IJ SOLN
INTRAMUSCULAR | Status: AC
  Filled 2017-07-03: qty 2

## 2017-07-03 MED ORDER — PANTOPRAZOLE SODIUM 40 MG PO TBEC
40.0000 mg | DELAYED_RELEASE_TABLET | Freq: Every day | ORAL | Status: DC
Start: 2017-07-03 — End: 2017-07-04

## 2017-07-03 MED ORDER — EPHEDRINE SULFATE-NACL 50-0.9 MG/10ML-% IV SOSY
PREFILLED_SYRINGE | INTRAVENOUS | Status: DC | PRN
  Administered 2017-07-03 (×4): 10 mg via INTRAVENOUS

## 2017-07-03 MED ORDER — LIDOCAINE HCL (CARDIAC) 20 MG/ML IV SOLN
INTRAVENOUS | Status: AC
  Filled 2017-07-03: qty 5

## 2017-07-03 MED ORDER — MAGNESIUM HYDROXIDE 400 MG/5ML PO SUSP: 30.0000 mL | Freq: Every day | ORAL | Status: DC | PRN

## 2017-07-03 MED ORDER — SODIUM CHLORIDE 0.9% FLUSH
3.0000 mL | Freq: Two times a day (BID) | INTRAVENOUS | Status: DC
  Administered 2017-07-03: 3 mL via INTRAVENOUS

## 2017-07-03 MED ORDER — BACLOFEN 10 MG PO TABS
10.0000 mg | ORAL_TABLET | Freq: Every day | ORAL | Status: DC
  Administered 2017-07-03: 10 mg via ORAL
  Filled 2017-07-03: qty 1

## 2017-07-03 MED ORDER — OXYCODONE HCL 5 MG PO TABS: 5.0000 mg | ORAL_TABLET | Freq: Once | ORAL | Status: DC | PRN

## 2017-07-03 MED ORDER — HYDROCHLOROTHIAZIDE 12.5 MG PO CAPS
12.5000 mg | ORAL_CAPSULE | Freq: Every day | ORAL | Status: DC
  Administered 2017-07-03: 12.5 mg via ORAL
  Filled 2017-07-03: qty 1

## 2017-07-03 MED ORDER — SODIUM CHLORIDE 0.9% FLUSH: 3.0000 mL | INTRAVENOUS | Status: DC | PRN

## 2017-07-03 MED ORDER — LISINOPRIL 20 MG PO TABS
20.0000 mg | ORAL_TABLET | Freq: Every day | ORAL | Status: DC
  Administered 2017-07-03: 20 mg via ORAL
  Filled 2017-07-03: qty 1

## 2017-07-03 MED ORDER — DEXAMETHASONE SODIUM PHOSPHATE 10 MG/ML IJ SOLN
INTRAMUSCULAR | Status: AC
  Filled 2017-07-03: qty 1

## 2017-07-03 MED ORDER — KETOROLAC TROMETHAMINE 30 MG/ML IJ SOLN
15.0000 mg | Freq: Four times a day (QID) | INTRAMUSCULAR | Status: DC
  Administered 2017-07-03 – 2017-07-04 (×3): 15 mg via INTRAVENOUS
  Filled 2017-07-03 (×3): qty 1

## 2017-07-03 MED ORDER — LIDOCAINE-EPINEPHRINE 1 %-1:100000 IJ SOLN
INTRAMUSCULAR | Status: AC
  Filled 2017-07-03: qty 1

## 2017-07-03 MED ORDER — BISACODYL 10 MG RE SUPP: 10.0000 mg | Freq: Every day | RECTAL | Status: DC | PRN

## 2017-07-03 MED ORDER — BUPIVACAINE HCL (PF) 0.5 % IJ SOLN
INTRAMUSCULAR | Status: AC
  Filled 2017-07-03: qty 30

## 2017-07-03 MED ORDER — THROMBIN (RECOMBINANT) 20000 UNITS EX SOLR
CUTANEOUS | Status: DC | PRN
  Administered 2017-07-03: 20 mL via TOPICAL

## 2017-07-03 MED ORDER — HYDROXYZINE HCL 50 MG/ML IM SOLN: 50.0000 mg | INTRAMUSCULAR | Status: DC | PRN

## 2017-07-03 MED ORDER — CEFAZOLIN SODIUM-DEXTROSE 2-4 GM/100ML-% IV SOLN
2.0000 g | INTRAVENOUS | Status: AC
  Administered 2017-07-03 (×2): 2 g via INTRAVENOUS
  Filled 2017-07-03: qty 100

## 2017-07-03 MED ORDER — SENNOSIDES-DOCUSATE SODIUM 8.6-50 MG PO TABS
1.0000 | ORAL_TABLET | Freq: Every day | ORAL | Status: DC
  Administered 2017-07-03: 1 via ORAL
  Filled 2017-07-03: qty 1

## 2017-07-03 MED ORDER — LIDOCAINE 2% (20 MG/ML) 5 ML SYRINGE
INTRAMUSCULAR | Status: DC | PRN
  Administered 2017-07-03: 60 mg via INTRAVENOUS

## 2017-07-03 MED ORDER — ACETAMINOPHEN 10 MG/ML IV SOLN
INTRAVENOUS | Status: AC
  Filled 2017-07-03: qty 100

## 2017-07-03 MED ORDER — ONDANSETRON HCL 4 MG/2ML IJ SOLN: 4.0000 mg | Freq: Once | INTRAMUSCULAR | Status: DC | PRN

## 2017-07-03 MED ORDER — SIMVASTATIN 20 MG PO TABS
20.0000 mg | ORAL_TABLET | Freq: Every evening | ORAL | Status: DC
  Administered 2017-07-03: 20 mg via ORAL
  Filled 2017-07-03: qty 1

## 2017-07-03 MED ORDER — GABAPENTIN 600 MG PO TABS
600.0000 mg | ORAL_TABLET | Freq: Every day | ORAL | Status: DC
  Administered 2017-07-03: 600 mg via ORAL
  Filled 2017-07-03: qty 1

## 2017-07-03 MED ORDER — CYCLOBENZAPRINE HCL 5 MG PO TABS
5.0000 mg | ORAL_TABLET | Freq: Three times a day (TID) | ORAL | Status: DC | PRN
  Administered 2017-07-03: 5 mg via ORAL
  Filled 2017-07-03: qty 1

## 2017-07-03 MED ORDER — MENTHOL 3 MG MT LOZG: 1.0000 | LOZENGE | OROMUCOSAL | Status: DC | PRN

## 2017-07-03 MED ORDER — KCL IN DEXTROSE-NACL 20-5-0.45 MEQ/L-%-% IV SOLN
INTRAVENOUS | Status: DC
  Administered 2017-07-03: 100 mL/h via INTRAVENOUS

## 2017-07-03 MED ORDER — THROMBIN 20000 UNITS EX SOLR
CUTANEOUS | Status: AC
  Filled 2017-07-03: qty 20000

## 2017-07-03 MED ORDER — 0.9 % SODIUM CHLORIDE (POUR BTL) OPTIME
TOPICAL | Status: DC | PRN
  Administered 2017-07-03 (×2): 1000 mL

## 2017-07-03 SURGICAL SUPPLY — 83 items
ADH SKN CLS APL DERMABOND .7 (GAUZE/BANDAGES/DRESSINGS) ×2
APL SKNCLS STERI-STRIP NONHPOA (GAUZE/BANDAGES/DRESSINGS)
BAG DECANTER FOR FLEXI CONT (MISCELLANEOUS) ×3 IMPLANT
BENZOIN TINCTURE PRP APPL 2/3 (GAUZE/BANDAGES/DRESSINGS) ×1 IMPLANT
BLADE CLIPPER SURG (BLADE) IMPLANT
BUR ACRON 5.0MM COATED (BURR) ×2 IMPLANT
BUR MATCHSTICK NEURO 3.0 LAGG (BURR) ×2 IMPLANT
CANISTER SUCT 3000ML PPV (MISCELLANEOUS) ×2 IMPLANT
CAP LCK SPNE (Orthopedic Implant) ×4 IMPLANT
CAP LOCK SPINE RADIUS (Orthopedic Implant) IMPLANT
CAP LOCKING (Orthopedic Implant) ×8 IMPLANT
CARTRIDGE OIL MAESTRO DRILL (MISCELLANEOUS) ×1 IMPLANT
CATH FOLEY 2WAY SLVR  5CC 14FR (CATHETERS) ×1
CATH FOLEY 2WAY SLVR 5CC 14FR (CATHETERS) IMPLANT
CONT SPEC 4OZ CLIKSEAL STRL BL (MISCELLANEOUS) ×2 IMPLANT
COVER BACK TABLE 60X90IN (DRAPES) ×2 IMPLANT
DECANTER SPIKE VIAL GLASS SM (MISCELLANEOUS) ×2 IMPLANT
DERMABOND ADVANCED (GAUZE/BANDAGES/DRESSINGS) ×2
DERMABOND ADVANCED .7 DNX12 (GAUZE/BANDAGES/DRESSINGS) ×1 IMPLANT
DIFFUSER DRILL AIR PNEUMATIC (MISCELLANEOUS) ×2 IMPLANT
DRAPE C-ARM 42X72 X-RAY (DRAPES) ×3 IMPLANT
DRAPE C-ARMOR (DRAPES) ×1 IMPLANT
DRAPE HALF SHEET 40X57 (DRAPES) ×1 IMPLANT
DRAPE LAPAROTOMY 100X72X124 (DRAPES) ×2 IMPLANT
DRAPE POUCH INSTRU U-SHP 10X18 (DRAPES) ×2 IMPLANT
ELECT REM PT RETURN 9FT ADLT (ELECTROSURGICAL) ×2
ELECTRODE REM PT RTRN 9FT ADLT (ELECTROSURGICAL) ×1 IMPLANT
GAUZE SPONGE 4X4 12PLY STRL (GAUZE/BANDAGES/DRESSINGS) ×1 IMPLANT
GAUZE SPONGE 4X4 12PLY STRL LF (GAUZE/BANDAGES/DRESSINGS) ×1 IMPLANT
GAUZE SPONGE 4X4 16PLY XRAY LF (GAUZE/BANDAGES/DRESSINGS) IMPLANT
GLOVE BIO SURGEON STRL SZ7 (GLOVE) ×3 IMPLANT
GLOVE BIO SURGEON STRL SZ8 (GLOVE) ×1 IMPLANT
GLOVE BIOGEL PI IND STRL 6.5 (GLOVE) IMPLANT
GLOVE BIOGEL PI IND STRL 7.0 (GLOVE) IMPLANT
GLOVE BIOGEL PI IND STRL 7.5 (GLOVE) IMPLANT
GLOVE BIOGEL PI IND STRL 8 (GLOVE) ×2 IMPLANT
GLOVE BIOGEL PI IND STRL 8.5 (GLOVE) IMPLANT
GLOVE BIOGEL PI INDICATOR 6.5 (GLOVE) ×3
GLOVE BIOGEL PI INDICATOR 7.0 (GLOVE) ×4
GLOVE BIOGEL PI INDICATOR 7.5 (GLOVE) ×2
GLOVE BIOGEL PI INDICATOR 8 (GLOVE) ×3
GLOVE BIOGEL PI INDICATOR 8.5 (GLOVE) ×1
GLOVE ECLIPSE 7.5 STRL STRAW (GLOVE) ×5 IMPLANT
GLOVE SURG SS PI 6.0 STRL IVOR (GLOVE) ×3 IMPLANT
GOWN STRL REUS W/ TWL LRG LVL3 (GOWN DISPOSABLE) IMPLANT
GOWN STRL REUS W/ TWL XL LVL3 (GOWN DISPOSABLE) ×2 IMPLANT
GOWN STRL REUS W/TWL 2XL LVL3 (GOWN DISPOSABLE) IMPLANT
GOWN STRL REUS W/TWL LRG LVL3 (GOWN DISPOSABLE) ×2
GOWN STRL REUS W/TWL XL LVL3 (GOWN DISPOSABLE) ×12
KIT BASIN OR (CUSTOM PROCEDURE TRAY) ×2 IMPLANT
KIT INFUSE X SMALL 1.4CC (Orthopedic Implant) ×1 IMPLANT
KIT TURNOVER KIT B (KITS) ×2 IMPLANT
NDL ASP BONE MRW 8GX15 (NEEDLE) IMPLANT
NDL SPNL 18GX3.5 QUINCKE PK (NEEDLE) ×1 IMPLANT
NDL SPNL 22GX3.5 QUINCKE BK (NEEDLE) ×1 IMPLANT
NEEDLE ASP BONE MRW 8GX15 (NEEDLE) ×2 IMPLANT
NEEDLE SPNL 18GX3.5 QUINCKE PK (NEEDLE) ×2 IMPLANT
NEEDLE SPNL 22GX3.5 QUINCKE BK (NEEDLE) ×4 IMPLANT
NS IRRIG 1000ML POUR BTL (IV SOLUTION) ×2 IMPLANT
OIL CARTRIDGE MAESTRO DRILL (MISCELLANEOUS) ×2
PACK LAMINECTOMY NEURO (CUSTOM PROCEDURE TRAY) ×2 IMPLANT
PAD ARMBOARD 7.5X6 YLW CONV (MISCELLANEOUS) ×8 IMPLANT
PATTIES SURGICAL .5 X.5 (GAUZE/BANDAGES/DRESSINGS) IMPLANT
PATTIES SURGICAL .5 X1 (DISPOSABLE) ×1 IMPLANT
PATTIES SURGICAL 1X1 (DISPOSABLE) IMPLANT
ROD 5.5X30MM (Rod) ×1 IMPLANT
ROD RADIUS 35MM (Rod) ×1 IMPLANT
SCREW 5.75X45MM (Screw) ×4 IMPLANT
SPACER SPINAL 8X25X12 4D (Spacer) ×2 IMPLANT
SPONGE LAP 4X18 X RAY DECT (DISPOSABLE) IMPLANT
SPONGE NEURO XRAY DETECT 1X3 (DISPOSABLE) IMPLANT
SPONGE SURGIFOAM ABS GEL 100 (HEMOSTASIS) ×2 IMPLANT
STRIP BIOACTIVE VITOSS 25X100X (Neuro Prosthesis/Implant) ×2 IMPLANT
SUT VIC AB 1 CT1 18XBRD ANBCTR (SUTURE) ×2 IMPLANT
SUT VIC AB 1 CT1 8-18 (SUTURE) ×6
SUT VIC AB 2-0 CP2 18 (SUTURE) ×5 IMPLANT
SYR 3ML LL SCALE MARK (SYRINGE) ×4 IMPLANT
SYR CONTROL 10ML LL (SYRINGE) ×2 IMPLANT
TAPE CLOTH SURG 4X10 WHT LF (GAUZE/BANDAGES/DRESSINGS) ×1 IMPLANT
TOWEL GREEN STERILE (TOWEL DISPOSABLE) ×2 IMPLANT
TOWEL GREEN STERILE FF (TOWEL DISPOSABLE) ×2 IMPLANT
TRAY FOLEY W/METER SILVER 16FR (SET/KITS/TRAYS/PACK) ×2 IMPLANT
WATER STERILE IRR 1000ML POUR (IV SOLUTION) ×2 IMPLANT

## 2017-07-03 NOTE — Anesthesia Procedure Notes (Signed)
Procedure Name: Intubation Date/Time: 07/03/2017 8:34 AM Performed by: Genelle Bal, CRNA Pre-anesthesia Checklist: Patient identified, Emergency Drugs available, Suction available and Patient being monitored Patient Re-evaluated:Patient Re-evaluated prior to induction Oxygen Delivery Method: Circle system utilized Preoxygenation: Pre-oxygenation with 100% oxygen Induction Type: IV induction Ventilation: Mask ventilation without difficulty Laryngoscope Size: Mac and 3 Grade View: Grade I Tube type: Oral Tube size: 7.0 mm Number of attempts: 1 Airway Equipment and Method: Stylet and Oral airway Placement Confirmation: ETT inserted through vocal cords under direct vision,  positive ETCO2 and breath sounds checked- equal and bilateral Secured at: 21 cm Tube secured with: Tape Dental Injury: Teeth and Oropharynx as per pre-operative assessment

## 2017-07-03 NOTE — Anesthesia Preprocedure Evaluation (Addendum)
Anesthesia Evaluation  Patient identified by MRN, date of birth, ID band Patient awake    Reviewed: Allergy & Precautions, NPO status , Patient's Chart, lab work & pertinent test results  Airway Mallampati: II  TM Distance: >3 FB Neck ROM: Full    Dental  (+) Teeth Intact, Dental Advisory Given   Pulmonary    breath sounds clear to auscultation       Cardiovascular hypertension,  Rhythm:Regular Rate:Normal     Neuro/Psych    GI/Hepatic   Endo/Other    Renal/GU      Musculoskeletal   Abdominal   Peds  Hematology   Anesthesia Other Findings   Reproductive/Obstetrics                             Anesthesia Physical Anesthesia Plan  ASA: III  Anesthesia Plan: General   Post-op Pain Management:    Induction: Intravenous  PONV Risk Score and Plan: Ondansetron and Dexamethasone  Airway Management Planned: Oral ETT  Additional Equipment:   Intra-op Plan:   Post-operative Plan: Extubation in OR  Informed Consent: I have reviewed the patients History and Physical, chart, labs and discussed the procedure including the risks, benefits and alternatives for the proposed anesthesia with the patient or authorized representative who has indicated his/her understanding and acceptance.       Plan Discussed with: CRNA and Anesthesiologist  Anesthesia Plan Comments:         Anesthesia Quick Evaluation  

## 2017-07-03 NOTE — Transfer of Care (Signed)
Immediate Anesthesia Transfer of Care Note  Patient: Morgan Boyer  Procedure(s) Performed: LUMBAR FOUR LUMBAR FIVE DECOMPRESSION, POSTERIOR LUMBAR INTERBODY FUSION, POSTERIOR LATERAL ARTHRODESIS (N/A Spine Lumbar)  Patient Location: PACU  Anesthesia Type:General  Level of Consciousness: awake, alert  and oriented  Airway & Oxygen Therapy: Patient Spontanous Breathing and Patient connected to face mask oxygen  Post-op Assessment: Report given to RN and Post -op Vital signs reviewed and stable  Post vital signs: Reviewed and stable  Last Vitals:  Vitals Value Taken Time  BP 144/72 07/03/2017 12:36 PM  Temp    Pulse 86 07/03/2017 12:37 PM  Resp 15 07/03/2017 12:37 PM  SpO2 97 % 07/03/2017 12:37 PM  Vitals shown include unvalidated device data.  Last Pain:  Vitals:   07/03/17 0655  PainSc: 6       Patients Stated Pain Goal: 2 (54/27/06 2376)  Complications: No apparent anesthesia complications

## 2017-07-03 NOTE — H&P (Signed)
Subjective: Patient is a 84 y.o. right-handed white female who is admitted for treatment of marked to severe multifactorial L4-5 lumbar stenosis with resulting neurogenic claudication. Patient's been having difficulties for over 2-1/2 years with low back pain extending into the buttocks and posterior thighs bilaterally. She's undergone numerous spinal injections in [place] with little in the way of relief.  X-rays show a grade 2 static degenerative spondylolisthesis of L4 and 5 and MRI scan reconfirmed spondylolisthesis with associated bilateral L4-5 facet arthropathy and the marked to severe multifactorial lumbar stenosis. She's undergone a bone density study which shows mild bone demineralization, but no osteopenia or osteoporosis. She is admitted now for a bilateral L4-5 lumbar decompression including laminectomy, facetectomy, and foraminotomy, and a bilateral L4-5 stabilization including posterior lumbar interbody arthrodesis with interbody implants and bone graft and posterior lateral arthrodesis with posterior instrumentation and bone graft.   Patient Active Problem List   Diagnosis Date Noted  . Near syncope 11/10/2014  . Nocturnal leg cramps 07/15/2014  . Abnormality of gait 07/15/2014  . Polyneuropathy in other diseases classified elsewhere ([place]) 01/14/2013  . Memory changes 01/14/2013   Past Medical History:  Diagnosis Date  . Abnormality of gait   . Adenomatous colon polyp   . Arthritis   . Back pain   . Cancer ([place])    skin cancers  . Depression   . Diverticulosis   . DJD (degenerative joint disease) of knee   . Dyslipidemia   . Esophageal reflux   . H/O hiatal hernia   . Heart murmur   . Herpes zoster    throat  . History of shingles    left throat 07/2007  . Hyperlipidemia   . Hypertension   . Memory changes 01/14/2013  . Neuromuscular disorder ([place])    peripheral neuropathy  . Nocturnal leg cramps 07/15/2014  . Peripheral neuropathy   .  Polyneuropathy in other diseases classified elsewhere ([place]) 01/14/2013    Past Surgical History:  Procedure Laterality Date  . ABDOMINAL HYSTERECTOMY    . APPENDECTOMY    . COLON SURGERY     2001 diverticulitis with infection ..drained surgically .  Marland Kitchen EYE SURGERY     cat ext bil   . JOINT REPLACEMENT     right  . KNEE ARTHROSCOPY  11/08/2011  . TONSILLECTOMY    . TOTAL KNEE ARTHROPLASTY  11/07/2011   Procedure: TOTAL KNEE ARTHROPLASTY;  Surgeon: Ninetta Lights, MD;  Location: [facility];  Service: Orthopedics;  Laterality: Left;  . TUBAL LIGATION    . WRIST SURGERY     for skin cancer    Medications Prior to Admission  Medication Sig Dispense Refill Last Dose  . aspirin 81 MG tablet Take 81 mg by mouth daily.   Past Week at Unknown time  . b complex vitamins tablet Take 1 tablet by mouth daily.   Past Week at Unknown time  . baclofen (LIORESAL) 10 MG tablet TAKE 1 TABLET BY MOUTH ONCE DAILY AT BEDTIME 90 tablet 2 Past Week at Unknown time  . Calcium Citrate (CITRACAL PO) Take 1 tablet by mouth 2 (two) times daily. + D3   Past Week at Unknown time  . diclofenac sodium (VOLTAREN) 1 % GEL Apply 1 application topically 3 (three) times daily as needed (pain).    07/02/2017 at Unknown time  . docusate sodium (COLACE) 100 MG capsule Take 100 mg by mouth 2 (two) times daily.   Past Week at Unknown time  .  esomeprazole (NEXIUM) 20 MG capsule Take 20 mg by mouth daily at 12 noon.   07/02/2017 at Unknown time  . gabapentin (NEURONTIN) 600 MG tablet Take 1 tablet (600 mg total) by mouth at bedtime. 90 tablet 0 07/02/2017 at Unknown time  . hydrocortisone 2.5 % cream Apply 1 application topically daily as needed (rash).   11 Past Week at Unknown time  . ibuprofen (ADVIL,MOTRIN) 200 MG tablet Take 400 mg by mouth 3 (three) times daily as needed for headache or moderate pain.    Past Week at Unknown time  . lisinopril-hydrochlorothiazide (PRINZIDE,ZESTORETIC) 20-12.5 MG per tablet Take 1 tablet by mouth daily.     07/02/2017 at Unknown time  . Polyethyl Glycol-Propyl Glycol (SYSTANE OP) Place 1 drop into both eyes 3 (three) times daily as needed. Dry eye   Past Week at Unknown time  . simvastatin (ZOCOR) 20 MG tablet Take 20 mg by mouth every evening.   07/02/2017 at Unknown time  . terbinafine (LAMISIL) 250 MG tablet Take 250 mg by mouth daily.   07/02/2017 at Unknown time  . traMADol (ULTRAM) 50 MG tablet Take 50 mg by mouth every 6 (six) hours as needed for moderate pain.    07/03/2017 at 0530  . Valerian Root 500 MG CAPS Take 1 capsule by mouth at bedtime as needed.   Past Week at Unknown time   Allergies  Allergen Reactions  . Septra [Sulfamethoxazole-Trimethoprim] Hives  . Lotensin [Benazepril Hcl] Other (See Comments)    NOT KNOWN    Social History   Tobacco Use  . Smoking status: Never Smoker  . Smokeless tobacco: Never Used  Substance Use Topics  . Alcohol use: No    Family History  Problem Relation Age of Onset  . Congestive Heart Failure Mother   . Stroke Father 38  . Heart attack Brother 47  . Diabetes Brother      Review of Systems A comprehensive review of systems was negative.  Objective: Vital signs in last 24 hours: Pulse Rate:  [76] 76 (04/03 0655) Resp:  [18] 18 (04/03 0655) BP: (180)/(81) 180/81 (04/03 0655) SpO2:  [100 %] 100 % (04/03 0655) Weight:  [60.8 kg (134 lb 1.6 oz)] 60.8 kg (134 lb 1.6 oz) (04/03 0655)  EXAM:  Patient's a well-developed well-nourished white female in no acute distress. Lungs are clear to auscultation , the patient has symmetrical respiratory excursion. Heart has a regular rate and rhythm normal S1 and S2 no murmur.   Abdomen is soft nontender nondistended bowel sounds are present. Extremity examination shows no clubbing cyanosis or edema. Motor examination shows 5 over 5 strength in the lower extremities including the iliopsoas quadriceps dorsiflexor extensor hallicus  longus and plantar flexor bilaterally. Sensation is intact to pinprick in the  distal lower extremities. Reflexes are symmetrical bilaterally. No pathologic reflexes are present. Patient has a normal gait and stance.   Data Review:CBC    Component Value Date/Time   WBC 6.1 07/01/2017 1408   RBC 4.42 07/01/2017 1408   HGB 12.3 07/01/2017 1408   HCT 38.1 07/01/2017 1408   PLT 173 07/01/2017 1408   MCV 86.2 07/01/2017 1408   MCH 27.8 07/01/2017 1408   MCHC 32.3 07/01/2017 1408   RDW 14.0 07/01/2017 1408   LYMPHSABS 2.8 01/05/2008 0700   MONOABS 1.0 01/05/2008 0700   EOSABS 0.1 01/05/2008 0700   BASOSABS 0.0 01/05/2008 0700  BMET    Component Value Date/Time   NA 139 07/01/2017 1408   K 3.9 07/01/2017 1408   CL 103 07/01/2017 1408   CO2 28 07/01/2017 1408   GLUCOSE 92 07/01/2017 1408   BUN 12 07/01/2017 1408   CREATININE 0.83 07/01/2017 1408   CALCIUM 9.4 07/01/2017 1408   GFRNONAA >60 07/01/2017 1408   GFRAA >60 07/01/2017 1408     Assessment/Plan: Patient with marked to severe L4-5 lumbar stenosis with associated grade 2 spondylolisthesis is admitted now for lumbar decompression and stabilization.  I've discussed with the patient the nature of his condition, the nature the surgical procedure, the typical length of surgery, hospital stay, and overall recuperation, the limitations postoperatively, and risks of surgery. I discussed risks including risks of infection, bleeding, possibly need for transfusion, the risk of nerve root dysfunction with pain, weakness, numbness, or paresthesias, the risk of dural tear and CSF leakage and possible need for further surgery, the risk of failure of the arthrodesis and possibly for further surgery, the risk of anesthetic complications including myocardial infarction, stroke, pneumonia, and death. We discussed the need for postoperative immobilization in a lumbar brace. Understanding all this the patient does wish to proceed with surgery and is admitted for such.     Hosie Spangle,  MD 07/03/2017 8:07 AM

## 2017-07-03 NOTE — Op Note (Signed)
07/03/2017  12:29 PM  PATIENT:  Morgan Boyer  84 y.o. female  PRE-OPERATIVE DIAGNOSIS:  L4-5 lumbar stenosis with neurogenic claudication, L4-5 grade 2 degenerative spondylolisthesis, lumbar spondylosis, lumbar degenerative disc disease  POST-OPERATIVE DIAGNOSIS:  L4-5 lumbar stenosis with neurogenic claudication, L4-5 grade 2 degenerative spondylolisthesis, lumbar spondylosis, lumbar degenerative disc disease  PROCEDURE:  Procedure(s):  Bilateral L4-5 lumbar decompression including laminectomy, facetectomy, and foraminotomies for decompression of the stenotic compression of the exiting L4 and L5 nerve roots bilaterally, with decompression beyond that required for interbody arthrodesis; bilateral L4-5 posterior lumbar interbody arthrodesis with AVS peek interbody implants with Vitoss BA with bone marrow aspirate and infuse; bilateral L4-5 posterior lateral arthrodesis with nonsegmental radius posterior instrumentation, Vitoss BA with bone marrow aspirate, and infuse  SURGEON:  Jovita Gamma, M.D.  ASSISTANTS: Kary Kos, M.D.  ANESTHESIA:   general  EBL:  Total I/O In: 1400 [I.V.:1400] Out: 380 [Urine:180; Blood:200]  BLOOD ADMINISTERED:none  CELL SAVER GIVEN: Cell Saver technician felt that there was insufficient blood loss or process to collect the blood  COUNT: Correct per nursing staff  DICTATION: Patient is brought to the operating room placed under general endotracheal anesthesia. The patient was turned to prone position the lumbar region was prepped with Betadine soap and solution and draped in a sterile fashion. The midline was infiltrated with local anesthesia with epinephrine. A localizing x-ray was taken and then a midline incision was made carried down through the subcutaneous tissue, bipolar cautery and electrocautery were used to maintain hemostasis. Dissection was carried down to the lumbar fascia. The fascia was incised bilaterally and the paraspinal muscles were  dissected with a spinous process and lamina in a subperiosteal fashion. Another x-ray was taken for localization and the L4-5 level was localized. Dissection was then carried out laterally over the facet complex, which were noted to be markedly hypertrophic and arthropathic.  The transverse processes of L4 and L5 were exposed and decorticated.  Using the high-speed drill and Kerrison punches bilateral L4 and L5 laminectomies were performed. The ligamentum flavum was markedly thickened, and carefully separated from the thecal sac and removed to help decompress the central canal stenosis. Dissection was carried out laterally including bilateral facetectomies and foraminotomies with decompression of the stenotic compression of the exiting L4 and L5 nerve roots bilaterally. Once the decompression of the stenotic compression of the thecal sac and exiting nerve roots was completed we proceeded with the posterior lumbar interbody arthrodesis. The annulus was incised bilaterally and the disc space entered. A thorough discectomy was performed using pituitary rongeurs and curettes. Once the discectomy was completed we began to prepare the endplate surfaces removing the cartilaginous endplates surface. We then measured the height of the intervertebral disc space. We selected 12 x 25 x 4 AVS peek interbody implants.  The C-arm fluoroscope was then draped and brought in the field and we identified the pedicle entry points bilaterally at the L4 and L5 levels. Each of the 4 pedicles was probed, we aspirated bone marrow aspirate from the vertebral bodies, this was injected over two 10 cc strips of Vitoss BA. Then each of the pedicles was examined with the ball probe good bony surfaces were found and no bony cuts were found. Each of the pedicles was then tapped with a 5.25 mm tap, again examined with the ball probe good threading was found and no bony cuts were found. We then placed 5.75 by 40 millimeter screws bilaterally at  each level.  We then packed the  AVS peek interbody implants with Vitoss BA with bone marrow aspirate and infuse, and then placed the first implant and on the right side, carefully retracting the thecal sac and nerve root medially. We then went back to the left side and packed the midline with additional Vitoss BA with bone marrow aspirate and infuse, and then placed a second implant and on the left side again retracting the thecal sac and nerve root medially. Additional Vitoss BA with bone marrow aspirate was packed lateral to the implants.  We then packed the lateral gutter over the transverse processes and intertransverse space with Vitoss BA with bone marrow aspirate and infuse. We then selected pre-lordosed rods, using a 30 mm rod on the left and a 25mm rod on the right.  They were placed within the screw heads and secured with locking caps once all 4 locking caps were placed final tightening was performed against a counter torque.  The wound had been irrigated multiple times during the procedure with saline solution and bacitracin solution, good hemostasis was established with a combination of bipolar cautery and Gelfoam with thrombin. Once good hemostasis was confirmed we proceeded with closure paraspinal muscles deep fascia and Scarpa's fascia were closed with interrupted undyed 1 Vicryl sutures the subcutaneous and subcuticular closed with interrupted inverted 2-0 undyed Vicryl sutures the skin edges were approximated with Dermabond. The wound was dressed with sterile gauze and Hypafix.  Following surgery the patient was turned back to the supine position to be reversed and the anesthetic extubated and transferred to the recovery room for further care.   PLAN OF CARE: Admit to inpatient   PATIENT DISPOSITION:  PACU - hemodynamically stable.   Delay start of Pharmacological VTE agent (>24hrs) due to surgical blood loss or risk of bleeding:  yes

## 2017-07-03 NOTE — Progress Notes (Signed)
Vitals:   07/03/17 1355 07/03/17 1410 07/03/17 1425 07/03/17 1448  BP: 126/64 122/62 121/66 (!) 142/77  Pulse: 77 76 76 79  Resp: 11 15 15 20   Temp:  (!) 97.5 F (36.4 C)  97.6 F (36.4 C)  TempSrc:    Oral  SpO2: 97% 96% 95% 100%  Weight:      Height:        CBC Recent Labs    07/01/17 1408  WBC 6.1  HGB 12.3  HCT 38.1  PLT 173   BMET Recent Labs    07/01/17 1408  NA 139  K 3.9  CL 103  CO2 28  GLUCOSE 92  BUN 12  CREATININE 0.83  CALCIUM 9.4    Patient resting in bed, she is comfortable.  She has ambulated to the end of the hall. Dressing clean and dry. Foley DC'd, nursing staff monitoring voiding function. Neurogenic claudication noticeably improved.  Plan: Doing well following surgery. Encouraged to continue to ambulate regularly in the halls. We'll continue to progress through postoperative recovery.  Hosie Spangle, MD 07/03/2017, 5:21 PM

## 2017-07-04 MED ORDER — HYDROCODONE-ACETAMINOPHEN 5-325 MG PO TABS: 1.0000 | ORAL_TABLET | ORAL | 0 refills | Status: DC | PRN

## 2017-07-04 MED FILL — Thrombin For Soln 20000 Unit: CUTANEOUS | Qty: 1 | Status: AC

## 2017-07-04 NOTE — Anesthesia Postprocedure Evaluation (Signed)
Anesthesia Post Note  Patient: Morgan Boyer  Procedure(s) Performed: LUMBAR FOUR LUMBAR FIVE DECOMPRESSION, POSTERIOR LUMBAR INTERBODY FUSION, POSTERIOR LATERAL ARTHRODESIS (N/A Spine Lumbar)     Patient location during evaluation: PACU Anesthesia Type: General Level of consciousness: awake and alert Pain management: pain level controlled Vital Signs Assessment: post-procedure vital signs reviewed and stable Respiratory status: spontaneous breathing, nonlabored ventilation, respiratory function stable and patient connected to nasal cannula oxygen Cardiovascular status: blood pressure returned to baseline and stable Postop Assessment: no apparent nausea or vomiting Anesthetic complications: no    Last Vitals:  Vitals:   07/03/17 2353 07/04/17 0403  BP: 100/67 (!) 112/59  Pulse: 73 75  Resp: 16 18  Temp: 36.8 C 36.5 C  SpO2: 97% 99%    Last Pain:  Vitals:   07/04/17 0544  TempSrc:   PainSc: 0-No pain                 Tobechukwu Emmick COKER

## 2017-07-04 NOTE — Progress Notes (Signed)
Pt doing well. Pt and son given D/C instructions with Rx, verbal understanding was provided. Pt's incision is open to air with no sign of infection. Pt's IV was removed prior to D/C. Pt D/C'd home via wheelchair @ 1025 per MD order. Pt is stable @ D/C and has no other needs at this time. Holli Humbles, RN

## 2017-07-04 NOTE — Discharge Summary (Signed)
Physician Discharge Summary  Patient ID: Morgan Boyer MRN: [id number] DOB/AGE: 82-Dec-1933 82 y.o.  Admit date: 07/03/2017 Discharge date: 07/04/2017  Admission Diagnoses:  L4-5 lumbar stenosis with neurogenic claudication, L4-5 grade 2 degenerative spondylolisthesis, lumbar spondylosis, lumbar degenerative disc disease  Discharge Diagnoses:  L4-5 lumbar stenosis with neurogenic claudication, L4-5 grade 2 degenerative spondylolisthesis, lumbar spondylosis, lumbar degenerative disc disease  Active Problems:   Lumbar stenosis with neurogenic claudication   Discharged Condition: good  Hospital Course:  Patient was admitted, underwent an L4-5 lumbar decompression and stabilization. She has done well following surgery, minimal discomfort.  She is voiding well. Her dressing was removed, and the incision is healing nicely. She has been given instructions regarding wound care and activities following discharge. She's scheduled follow-up with me in about 3 weeks with x-rays.  Discharge Exam: Blood pressure 112/61, pulse 72, temperature 98 F (36.7 C), temperature source Oral, resp. rate 18, height 5\' 2"  (1.575 m), weight 60.8 kg (134 lb 1.6 oz), SpO2 93 %.  Disposition: Discharge disposition: 01-Home or Self Care       Discharge Instructions    Discharge wound care:   Complete by:  As directed    Leave the wound open to air. Shower daily with the wound uncovered. Water and soapy water should run over the incision area. Do not wash directly on the incision for 2 weeks. Remove the glue after 2 weeks.   Driving Restrictions   Complete by:  As directed    No driving for 2 weeks. May ride in the car locally now. May begin to drive locally in 2 weeks.   Other Restrictions   Complete by:  As directed    Walk gradually increasing distances out in the fresh air at least twice a day. Walking additional 6 times inside the house, gradually increasing distances, daily. No bending, lifting, or  twisting. Perform activities between shoulder and waist height (that is at counter height when standing or table height when sitting).     Allergies as of 07/04/2017      Reactions   Septra [sulfamethoxazole-trimethoprim] Hives   Lotensin [benazepril Hcl] Other (See Comments)   NOT KNOWN      Medication List    TAKE these medications   aspirin 81 MG tablet Take 81 mg by mouth daily.   b complex vitamins tablet Take 1 tablet by mouth daily.   baclofen 10 MG tablet Commonly known as:  LIORESAL TAKE 1 TABLET BY MOUTH ONCE DAILY AT BEDTIME   CITRACAL PO Take 1 tablet by mouth 2 (two) times daily. + D3   diclofenac sodium 1 % Gel Commonly known as:  VOLTAREN Apply 1 application topically 3 (three) times daily as needed (pain).   docusate sodium 100 MG capsule Commonly known as:  COLACE Take 100 mg by mouth 2 (two) times daily.   esomeprazole 20 MG capsule Commonly known as:  NEXIUM Take 20 mg by mouth daily at 12 noon.   gabapentin 600 MG tablet Commonly known as:  NEURONTIN Take 1 tablet (600 mg total) by mouth at bedtime.   HYDROcodone-acetaminophen 5-325 MG tablet Commonly known as:  NORCO/VICODIN Take 1 tablet by mouth every 4 (four) hours as needed (pain).   hydrocortisone 2.5 % cream Apply 1 application topically daily as needed (rash).   ibuprofen 200 MG tablet Commonly known as:  ADVIL,MOTRIN Take 400 mg by mouth 3 (three) times daily as needed for headache or moderate pain.   lisinopril-hydrochlorothiazide 20-12.5 MG tablet Commonly  known as:  PRINZIDE,ZESTORETIC Take 1 tablet by mouth daily.   simvastatin 20 MG tablet Commonly known as:  ZOCOR Take 20 mg by mouth every evening.   SYSTANE OP Place 1 drop into both eyes 3 (three) times daily as needed. Dry eye   terbinafine 250 MG tablet Commonly known as:  LAMISIL Take 250 mg by mouth daily.   traMADol 50 MG tablet Commonly known as:  ULTRAM Take 50 mg by mouth every 6 (six) hours as needed for  moderate pain.   Valerian Root 500 MG Caps Take 1 capsule by mouth at bedtime as needed.            Discharge Care Instructions  (From admission, onward)        Start     Ordered   07/04/17 0000  Discharge wound care:    Comments:  Leave the wound open to air. Shower daily with the wound uncovered. Water and soapy water should run over the incision area. Do not wash directly on the incision for 2 weeks. Remove the glue after 2 weeks.   07/04/17 3338       Signed: Hosie Spangle 07/04/2017, 9:07 AM

## 2017-07-04 NOTE — Discharge Instructions (Signed)
Wound Care °Leave incision open to air. °You may shower. °Do not scrub directly on incision.  °Do not put any creams, lotions, or ointments on incision. °Activity °Walk each and every day, increasing distance each day. °No lifting greater than 5 lbs.  Avoid bending, arching, and twisting. °No driving for 2 weeks; may ride as a passenger locally. °If provided with back brace, wear when out of bed.  It is not necessary to wear in bed. °Diet °Resume your normal diet.  °Return to Work °Will be discussed at you follow up appointment. °Call Your Doctor If Any of These Occur °Redness, drainage, or swelling at the wound.  °Temperature greater than 101 degrees. °Severe pain not relieved by pain medication. °Incision starts to come apart. °Follow Up Appt °Call today for appointment in 3 weeks ([phone]) or for problems.  If you have any hardware placed in your spine, you will need an x-ray before your appointment. ° ° ° °Spinal Fusion, Care After °These instructions give you information about caring for yourself after your procedure. Your doctor may also give you more specific instructions. Call your doctor if you have any problems or questions after your procedure. °Follow these instructions at home: °Medicines °· Take over-the-counter and prescription medicines only as told by your doctor. These include any medicines for pain. °· Do not drive for 24 hours if you received a sedative. °· Do not drive or use heavy machinery while taking prescription pain medicine. °· If you were prescribed an antibiotic medicine, take it as told by your doctor. Do not stop taking the antibiotic even if you start to feel better. °Surgical Cut (Incision) Care °· Follow instructions from your doctor about how to take care of your surgical cut. Make sure you: °? Wash your hands with soap and water before you change your bandage (dressing). If you cannot use soap and water, use hand sanitizer. °? Change your bandage as told by your doctor. °? Leave  stitches (sutures), skin glue, or skin tape (adhesive) strips in place. They may need to stay in place for 2 weeks or longer. If tape strips get loose and curl up, you may trim the loose edges. Do not remove tape strips completely unless your doctor says it is okay. °· Keep your surgical cut clean and dry. Do not take baths, swim, or use a hot tub until your doctor says it is okay. °· Check your surgical cut and the area around it every day for: °? Redness. °? Swelling. °? Fluid. °Physical Activity °· Return to your normal activities as told by your doctor. Ask your doctor what activities are safe for you. Rest and protect your back as much as you can. °· Follow instructions from your doctor about how to move. Use good posture to help your spine heal. °· Do not lift anything that is heavier than 8 lb (3.6 kg) or as told by your doctor until he or she says that it is safe. Do not lift anything over your head. °· Do not twist or bend at the waist until your doctor says it is okay. °· Avoid pushing or pulling motions. °· Do not sit or lie down in the same position for long periods of time. °· Do not start to exercise until your doctor says it is okay. Ask your doctor what kinds of exercise you can do to make your back stronger. °General instructions °· If you were given a brace, use it as told by your doctor. °· Wear   compression stockings as told by your doctor. °· Do not use tobacco products. These include cigarettes, chewing tobacco, or e-cigarettes. If you need help quitting, ask your doctor. °· Keep all follow-up visits as told by your doctor. This is important. This includes any visits with your physical therapist, if this applies. °Contact a doctor if: °· Your pain gets worse. °· Your medicine does not help your pain. °· Your legs or feet become painful or swollen. °· Your surgical cut is red, swollen, or painful. °· You have fluid, blood, or pus coming from your surgical cut. °· You feel sick to your stomach  (nauseous). °· You throw up (vomit). °· Your have weakness or loss of feeling (numbness) in your legs that is new or getting worse. °· You have a fever. °· You have trouble controlling when you pee (urinate) or poop (have a bowel movement). °Get help right away if: °· Your pain is very bad. °· You have chest pain. °· You have trouble breathing. °· You start to have a cough. °These symptoms may be an emergency. Do not wait to see if the symptoms will go away. Get medical help right away. Call your local emergency services (911 in the [place] Do not drive yourself to the hospital. °This information is not intended to replace advice given to you by your health care provider. Make sure you discuss any questions you have with your health care provider. °Document Released: 07/13/2010 Document Revised: 11/15/2015 Document Reviewed: 09/01/2014 °Elsevier Interactive Patient Education © 2018 [employer] ° °

## 2017-07-09 MED FILL — Sodium Chloride IV Soln 0.9%: INTRAVENOUS | Qty: 1000 | Status: AC

## 2017-07-09 MED FILL — Heparin Sodium (Porcine) Inj 1000 Unit/ML: INTRAMUSCULAR | Qty: 30 | Status: AC

## 2017-07-22 DIAGNOSIS — M5136 Other intervertebral disc degeneration, lumbar region: Secondary | ICD-10-CM | POA: Diagnosis not present

## 2017-07-22 DIAGNOSIS — M48062 Spinal stenosis, lumbar region with neurogenic claudication: Secondary | ICD-10-CM | POA: Diagnosis not present

## 2017-07-22 DIAGNOSIS — M4316 Spondylolisthesis, lumbar region: Secondary | ICD-10-CM | POA: Diagnosis not present

## 2017-07-22 DIAGNOSIS — Z981 Arthrodesis status: Secondary | ICD-10-CM | POA: Diagnosis not present

## 2017-07-22 DIAGNOSIS — M47816 Spondylosis without myelopathy or radiculopathy, lumbar region: Secondary | ICD-10-CM | POA: Diagnosis not present

## 2017-08-06 DIAGNOSIS — L989 Disorder of the skin and subcutaneous tissue, unspecified: Secondary | ICD-10-CM | POA: Diagnosis not present

## 2017-08-21 DIAGNOSIS — H26491 Other secondary cataract, right eye: Secondary | ICD-10-CM | POA: Diagnosis not present

## 2017-08-21 DIAGNOSIS — H01021 Squamous blepharitis right upper eyelid: Secondary | ICD-10-CM | POA: Diagnosis not present

## 2017-08-21 DIAGNOSIS — H01025 Squamous blepharitis left lower eyelid: Secondary | ICD-10-CM | POA: Diagnosis not present

## 2017-08-21 DIAGNOSIS — Z961 Presence of intraocular lens: Secondary | ICD-10-CM | POA: Diagnosis not present

## 2017-08-21 DIAGNOSIS — H01024 Squamous blepharitis left upper eyelid: Secondary | ICD-10-CM | POA: Diagnosis not present

## 2017-08-21 DIAGNOSIS — H04123 Dry eye syndrome of bilateral lacrimal glands: Secondary | ICD-10-CM | POA: Diagnosis not present

## 2017-08-21 DIAGNOSIS — H01022 Squamous blepharitis right lower eyelid: Secondary | ICD-10-CM | POA: Diagnosis not present

## 2017-09-02 ENCOUNTER — Other Ambulatory Visit: Payer: Self-pay | Admitting: Neurology

## 2017-09-23 DIAGNOSIS — I10 Essential (primary) hypertension: Secondary | ICD-10-CM | POA: Diagnosis not present

## 2017-09-23 DIAGNOSIS — Z981 Arthrodesis status: Secondary | ICD-10-CM | POA: Diagnosis not present

## 2017-09-23 DIAGNOSIS — M47816 Spondylosis without myelopathy or radiculopathy, lumbar region: Secondary | ICD-10-CM | POA: Diagnosis not present

## 2017-09-23 DIAGNOSIS — M5136 Other intervertebral disc degeneration, lumbar region: Secondary | ICD-10-CM | POA: Diagnosis not present

## 2017-09-23 DIAGNOSIS — M4316 Spondylolisthesis, lumbar region: Secondary | ICD-10-CM | POA: Diagnosis not present

## 2017-11-11 ENCOUNTER — Other Ambulatory Visit: Payer: Self-pay | Admitting: Neurology

## 2017-11-25 DIAGNOSIS — H9190 Unspecified hearing loss, unspecified ear: Secondary | ICD-10-CM | POA: Diagnosis not present

## 2017-11-25 DIAGNOSIS — R8290 Unspecified abnormal findings in urine: Secondary | ICD-10-CM | POA: Diagnosis not present

## 2017-11-26 ENCOUNTER — Encounter

## 2017-11-26 ENCOUNTER — Ambulatory Visit: Payer: Medicare Other | Admitting: Neurology

## 2017-12-05 ENCOUNTER — Ambulatory Visit: Payer: Medicare Other | Admitting: Adult Health

## 2017-12-18 DIAGNOSIS — H903 Sensorineural hearing loss, bilateral: Secondary | ICD-10-CM | POA: Diagnosis not present

## 2018-01-01 DIAGNOSIS — D485 Neoplasm of uncertain behavior of skin: Secondary | ICD-10-CM | POA: Diagnosis not present

## 2018-01-01 DIAGNOSIS — L309 Dermatitis, unspecified: Secondary | ICD-10-CM | POA: Diagnosis not present

## 2018-01-01 DIAGNOSIS — Z23 Encounter for immunization: Secondary | ICD-10-CM | POA: Diagnosis not present

## 2018-01-01 DIAGNOSIS — L720 Epidermal cyst: Secondary | ICD-10-CM | POA: Diagnosis not present

## 2018-01-01 DIAGNOSIS — L821 Other seborrheic keratosis: Secondary | ICD-10-CM | POA: Diagnosis not present

## 2018-01-21 DIAGNOSIS — M47816 Spondylosis without myelopathy or radiculopathy, lumbar region: Secondary | ICD-10-CM | POA: Diagnosis not present

## 2018-01-21 DIAGNOSIS — Z981 Arthrodesis status: Secondary | ICD-10-CM | POA: Diagnosis not present

## 2018-01-21 DIAGNOSIS — M5136 Other intervertebral disc degeneration, lumbar region: Secondary | ICD-10-CM | POA: Diagnosis not present

## 2018-01-27 DIAGNOSIS — Z23 Encounter for immunization: Secondary | ICD-10-CM | POA: Diagnosis not present

## 2018-02-03 DIAGNOSIS — L309 Dermatitis, unspecified: Secondary | ICD-10-CM | POA: Diagnosis not present

## 2018-02-03 DIAGNOSIS — L299 Pruritus, unspecified: Secondary | ICD-10-CM | POA: Diagnosis not present

## 2018-02-09 ENCOUNTER — Other Ambulatory Visit: Payer: Self-pay | Admitting: Neurology

## 2018-02-10 ENCOUNTER — Telehealth: Payer: Self-pay | Admitting: Neurology

## 2018-02-10 MED ORDER — GABAPENTIN 600 MG PO TABS: 600.0000 mg | ORAL_TABLET | Freq: Every day | ORAL | 0 refills | Status: DC

## 2018-02-10 MED ORDER — BACLOFEN 10 MG PO TABS: 10.0000 mg | ORAL_TABLET | Freq: Every day | ORAL | 0 refills | Status: DC

## 2018-02-10 NOTE — Telephone Encounter (Signed)
Pt requesting refills for gabapentin (NEURONTIN) 600 MG tablet and baclofen (LIORESAL) 10 MG tablet sent to [facility]. Scheduled f/u with NP for 11/14

## 2018-02-10 NOTE — Telephone Encounter (Signed)
30 day rx's of both Gabapentin and Baclofen escribed to [facility] as requested/fim

## 2018-02-13 ENCOUNTER — Encounter: Payer: Self-pay | Admitting: Adult Health

## 2018-02-13 ENCOUNTER — Ambulatory Visit (INDEPENDENT_AMBULATORY_CARE_PROVIDER_SITE_OTHER): Payer: Medicare Other | Admitting: Adult Health

## 2018-02-13 VITALS — BP 118/72 | HR 76 | Ht 62.0 in | Wt 142.2 lb

## 2018-02-13 DIAGNOSIS — G4762 Sleep related leg cramps: Secondary | ICD-10-CM

## 2018-02-13 DIAGNOSIS — G63 Polyneuropathy in diseases classified elsewhere: Secondary | ICD-10-CM

## 2018-02-13 MED ORDER — BACLOFEN 10 MG PO TABS: 10.0000 mg | ORAL_TABLET | Freq: Every day | ORAL | 3 refills | Status: DC

## 2018-02-13 MED ORDER — GABAPENTIN 600 MG PO TABS: 600.0000 mg | ORAL_TABLET | Freq: Every day | ORAL | 3 refills | Status: DC

## 2018-02-13 NOTE — Patient Instructions (Signed)
Your Plan:  Continue gabapentin and baclofen If your symptoms worsen or you develop new symptoms please let us know.   Thank you for coming to see [place] at [facility]. I hope we have been able to provide you high quality care today.  You may receive a patient satisfaction survey over the next few weeks. We would appreciate your feedback and comments so that we may continue to improve ourselves and the health of our patients.

## 2018-02-13 NOTE — Progress Notes (Signed)
PATIENT: Morgan Boyer DOB: 1932-03-15  REASON FOR VISIT: follow up HISTORY FROM: patient  HISTORY OF PRESENT ILLNESS: Today 02/13/18: Morgan Boyer is a 84 year old female with a history of peripheral neuropathy.  She returns today for follow-up.  She reports that her neuropathy has been controlled with gabapentin.  On occasion she will still have numbness and tingling in the feet and legs.  She states that she did have back surgery April 3 and her back pain has significantly improved.  She is no longer on pain medication.  She reports that baclofen continues to be beneficial for nocturnal muscle cramps.  She denies any falls.  She does not typically use a cane or walker but does report that she has a 3 wheeled Rollator that she sometimes will use at home especially she gets up at night.  Overall she feels that she is doing well.  She returns today for evaluation.   HISTORY [name] is an 84 year old right-handed white female with a history of a peripheral neuropathy.  The patient does have some mild gait instability, she has not had any falls since last seen.  She does have a 3 wheel walker that she uses in the home environment.  The patient has been followed through her orthopedic surgeon for severe lumbosacral spinal stenosis at the L4-5 level.  The patient has significant facet joint arthritis at the L5-S1 level as well.  The patient is not sure she wants to have surgery, she is hesitant to make this decision.  On the other hand, she has had significant pain and she is limited in her level physical activity because of the back pain.  The patient is on gabapentin 600 mg at night.  She takes baclofen at night for her leg cramps which seems to help.  The patient does have a mild memory disturbance but this has not worsened since last seen.  She returns to this office for an evaluation.  REVIEW OF SYSTEMS: Out of a complete 14 system review of symptoms, the patient complains only of the following  symptoms, and all other reviewed systems are negative.  Light sensitivity, incontinence of bladder, frequency of urination, leg swelling, restless leg, nervous/anxious, rash, hearing loss, trouble swallowing  ALLERGIES: Allergies  Allergen Reactions  . Septra [Sulfamethoxazole-Trimethoprim] Hives  . Lotensin [Benazepril Hcl] Other (See Comments)    NOT KNOWN    HOME MEDICATIONS: Outpatient Medications Prior to Visit  Medication Sig Dispense Refill  . aspirin 81 MG tablet Take 81 mg by mouth daily.    Marland Kitchen b complex vitamins tablet Take 1 tablet by mouth daily.    . baclofen (LIORESAL) 10 MG tablet Take 1 tablet (10 mg total) by mouth at bedtime. 30 tablet 0  . Calcium Citrate (CITRACAL PO) Take 1 tablet by mouth 2 (two) times daily. + D3    . esomeprazole (NEXIUM) 20 MG capsule Take 20 mg by mouth daily at 12 noon.    . furosemide (LASIX) 20 MG tablet Take 20 mg by mouth daily as needed.  1  . gabapentin (NEURONTIN) 600 MG tablet Take 1 tablet (600 mg total) by mouth at bedtime. 30 tablet 0  . hydrocortisone 2.5 % cream Apply 1 application topically daily as needed (rash).   11  . ibuprofen (ADVIL,MOTRIN) 200 MG tablet Take 400 mg by mouth 3 (three) times daily as needed for headache or moderate pain.     Marland Kitchen lisinopril-hydrochlorothiazide (PRINZIDE,ZESTORETIC) 20-12.5 MG per tablet Take 1 tablet by  mouth daily.     Vladimir Faster Glycol-Propyl Glycol (SYSTANE OP) Place 1 drop into both eyes 3 (three) times daily as needed. Dry eye    . simvastatin (ZOCOR) 20 MG tablet Take 20 mg by mouth every evening.    Cristino Martes Root 500 MG CAPS Take 1 capsule by mouth at bedtime as needed.    . diclofenac sodium (VOLTAREN) 1 % GEL Apply 1 application topically 3 (three) times daily as needed (pain).     Marland Kitchen docusate sodium (COLACE) 100 MG capsule Take 100 mg by mouth 2 (two) times daily.    Marland Kitchen HYDROcodone-acetaminophen (NORCO/VICODIN) 5-325 MG tablet Take 1 tablet by mouth every 4 (four) hours as needed  (pain). 30 tablet 0  . terbinafine (LAMISIL) 250 MG tablet Take 250 mg by mouth daily.    . traMADol (ULTRAM) 50 MG tablet Take 50 mg by mouth every 6 (six) hours as needed for moderate pain.      No facility-administered medications prior to visit.     PAST MEDICAL HISTORY: Past Medical History:  Diagnosis Date  . Abnormality of gait   . Adenomatous colon polyp   . Arthritis   . Back pain   . Cancer ([place])    skin cancers  . Depression   . Diverticulosis   . DJD (degenerative joint disease) of knee   . Dyslipidemia   . Esophageal reflux   . H/O hiatal hernia   . Heart murmur   . Herpes zoster    throat  . History of shingles    left throat 07/2007  . Hyperlipidemia   . Hypertension   . Memory changes 01/14/2013  . Neuromuscular disorder ([place])    peripheral neuropathy  . Nocturnal leg cramps 07/15/2014  . Peripheral neuropathy   . Polyneuropathy in other diseases classified elsewhere ([place]) 01/14/2013    PAST SURGICAL HISTORY: Past Surgical History:  Procedure Laterality Date  . ABDOMINAL HYSTERECTOMY    . APPENDECTOMY    . BACK SURGERY  07/2017  . COLON SURGERY     2001 diverticulitis with infection ..drained surgically .  Marland Kitchen EYE SURGERY     cat ext bil   . JOINT REPLACEMENT     right  . KNEE ARTHROSCOPY  11/08/2011  . TONSILLECTOMY    . TOTAL KNEE ARTHROPLASTY  11/07/2011   Procedure: TOTAL KNEE ARTHROPLASTY;  Surgeon: Ninetta Lights, MD;  Location: [facility];  Service: Orthopedics;  Laterality: Left;  . TUBAL LIGATION    . WRIST SURGERY     for skin cancer    FAMILY HISTORY: Family History  Problem Relation Age of Onset  . Congestive Heart Failure Mother   . Stroke Father 63  . Heart attack Brother 28  . Diabetes Brother     SOCIAL HISTORY: Social History   Socioeconomic History  . Marital status: Married    Spouse name: Not on file  . Number of children: 3  . Years of education: hs  . Highest education level: Not on file  Occupational History  .  Occupation: retired  Scientific laboratory technician  . Financial resource strain: Not on file  . Food insecurity:    Worry: Not on file    Inability: Not on file  . Transportation needs:    Medical: Not on file    Non-medical: Not on file  Tobacco Use  . Smoking status: Never Smoker  . Smokeless tobacco: Never Used  Substance and Sexual Activity  . Alcohol use: No  .  Drug use: No  . Sexual activity: Never  Lifestyle  . Physical activity:    Days per week: Not on file    Minutes per session: Not on file  . Stress: Not on file  Relationships  . Social connections:    Talks on phone: Not on file    Gets together: Not on file    Attends religious service: Not on file    Active member of club or organization: Not on file    Attends meetings of clubs or organizations: Not on file    Relationship status: Not on file  . Intimate partner violence:    Fear of current or ex partner: Not on file    Emotionally abused: Not on file    Physically abused: Not on file    Forced sexual activity: Not on file  Other Topics Concern  . Not on file  Social History Narrative   Husband deceased from lung cancer 06/22/13.   Patient is right handed.   Patient drinks 2 cups of caffeine daily.      PHYSICAL EXAM  Vitals:   02/13/18 1110  BP: 118/72  Pulse: 76  Weight: 142 lb 3.2 oz (64.5 kg)  Height: 5\' 2"  (1.575 m)   Body mass index is 26.01 kg/m.  Generalized: Well developed, in no acute distress   Neurological examination  Mentation: Alert oriented to time, place, history taking. Follows all commands speech and language fluent Cranial nerve II-XII: Pupils were equal round reactive to light. Extraocular movements were full, visual field were full on confrontational test. Facial sensation and strength were normal. Uvula tongue midline. Head turning and shoulder shrug  were normal and symmetric. Motor: The motor testing reveals 5 over 5 strength of all 4 extremities. Good symmetric motor tone is noted  throughout.  Sensory: Sensory testing is intact to soft touch on all 4 extremities. No evidence of extinction is noted.  Coordination: Cerebellar testing reveals good finger-nose-finger and heel-to-shin bilaterally.  Gait and station: Gait is normal.  Reflexes: Deep tendon reflexes are symmetric and normal bilaterally.   DIAGNOSTIC DATA (LABS, IMAGING, TESTING) - I reviewed patient records, labs, notes, testing and imaging myself where available.  Lab Results  Component Value Date   WBC 6.1 07/01/2017   HGB 12.3 07/01/2017   HCT 38.1 07/01/2017   MCV 86.2 07/01/2017   PLT 173 07/01/2017      Component Value Date/Time   NA 139 07/01/2017 1408   K 3.9 07/01/2017 1408   CL 103 07/01/2017 1408   CO2 28 07/01/2017 1408   GLUCOSE 92 07/01/2017 1408   BUN 12 07/01/2017 1408   CREATININE 0.83 07/01/2017 1408   CALCIUM 9.4 07/01/2017 1408   PROT 7.5 10/31/2011 1134   ALBUMIN 4.2 10/31/2011 1134   AST 40 (H) 10/31/2011 1134   ALT 19 10/31/2011 1134   ALKPHOS 40 10/31/2011 1134   BILITOT 0.6 10/31/2011 1134   GFRNONAA >60 07/01/2017 1408   GFRAA >60 07/01/2017 1408      ASSESSMENT AND PLAN 84 y.o. year old female  has a past medical history of Abnormality of gait, Adenomatous colon polyp, Arthritis, Back pain, Cancer ([place]), Depression, Diverticulosis, DJD (degenerative joint disease) of knee, Dyslipidemia, Esophageal reflux, H/O hiatal hernia, Heart murmur, Herpes zoster, History of shingles, Hyperlipidemia, Hypertension, Memory changes (01/14/2013), Neuromuscular disorder ([place]), Nocturnal leg cramps (07/15/2014), Peripheral neuropathy, and Polyneuropathy in other diseases classified elsewhere ([place]) (01/14/2013). here with:  1.  Peripheral neuropathy 2.  Nocturnal leg cramps  The patient will continue on gabapentin 600 mg at bedtime for neuropathy.  She will continue on baclofen 10 mg at bedtime for leg cramps.  She is advised that if her symptoms worsen or she develops new symptoms  she should let us know.  She will follow-up in 1 year or sooner if needed.   Ward Givens, MSN, NP-C 02/13/2018, 11:36 AM [facility] [address] [place],  [address] [phone]

## 2018-02-13 NOTE — Progress Notes (Signed)
I have read the note, and I agree with the clinical assessment and plan.  Selinda Korzeniewski K Taaliyah Delpriore   

## 2018-04-10 DIAGNOSIS — M4800 Spinal stenosis, site unspecified: Secondary | ICD-10-CM | POA: Diagnosis not present

## 2018-04-10 DIAGNOSIS — Z0000 Encounter for general adult medical examination without abnormal findings: Secondary | ICD-10-CM | POA: Diagnosis not present

## 2018-04-10 DIAGNOSIS — M069 Rheumatoid arthritis, unspecified: Secondary | ICD-10-CM | POA: Diagnosis not present

## 2018-04-10 DIAGNOSIS — R7303 Prediabetes: Secondary | ICD-10-CM | POA: Diagnosis not present

## 2018-04-10 DIAGNOSIS — I340 Nonrheumatic mitral (valve) insufficiency: Secondary | ICD-10-CM | POA: Diagnosis not present

## 2018-04-10 DIAGNOSIS — I10 Essential (primary) hypertension: Secondary | ICD-10-CM | POA: Diagnosis not present

## 2018-04-10 DIAGNOSIS — L989 Disorder of the skin and subcutaneous tissue, unspecified: Secondary | ICD-10-CM | POA: Diagnosis not present

## 2018-04-10 DIAGNOSIS — E7800 Pure hypercholesterolemia, unspecified: Secondary | ICD-10-CM | POA: Diagnosis not present

## 2018-05-01 DIAGNOSIS — B351 Tinea unguium: Secondary | ICD-10-CM | POA: Diagnosis not present

## 2018-05-15 ENCOUNTER — Encounter: Payer: Self-pay | Admitting: Cardiovascular Disease

## 2018-05-17 ENCOUNTER — Ambulatory Visit: Payer: PRIVATE HEALTH INSURANCE | Admitting: Sports Medicine

## 2018-05-17 ENCOUNTER — Encounter: Payer: Self-pay | Admitting: Sports Medicine

## 2018-05-17 DIAGNOSIS — B351 Tinea unguium: Secondary | ICD-10-CM | POA: Diagnosis not present

## 2018-05-17 DIAGNOSIS — L601 Onycholysis: Secondary | ICD-10-CM

## 2018-05-17 DIAGNOSIS — M79675 Pain in left toe(s): Secondary | ICD-10-CM

## 2018-05-17 NOTE — Progress Notes (Signed)
Subjective: Morgan Boyer is a 85 y.o. female patient seen today in office with complaint of mildly painful thickened and discolored nails on left with lifting of left great toenail. Patient is desiring treatment for nail changes; recently started on Itraconazole on 05/05/18. Reports that nails are becoming difficult to manage because of the thickness and big toe gets sore because of how it grows. Patient has no other pedal complaints at this time.   Patient is assisted by son at this visit.  Review of Systems  Skin:       Nail changes  All other systems reviewed and are negative.    Patient Active Problem List   Diagnosis Date Noted  . Lumbar stenosis with neurogenic claudication 07/03/2017  . Near syncope 11/10/2014  . Nocturnal leg cramps 07/15/2014  . Abnormality of gait 07/15/2014  . Polyneuropathy in other diseases classified elsewhere ([place]) 01/14/2013  . Memory changes 01/14/2013    Current Outpatient Medications on File Prior to Visit  Medication Sig Dispense Refill  . aspirin 81 MG tablet Take 81 mg by mouth daily.    Marland Kitchen b complex vitamins tablet Take 1 tablet by mouth daily.    . baclofen (LIORESAL) 10 MG tablet Take 1 tablet (10 mg total) by mouth at bedtime. 90 tablet 3  . Calcium Citrate (CITRACAL PO) Take 1 tablet by mouth 2 (two) times daily. + D3    . diclofenac sodium (VOLTAREN) 1 % GEL Apply 1 application topically 3 (three) times daily as needed (pain).     Marland Kitchen esomeprazole (NEXIUM) 20 MG capsule Take 20 mg by mouth daily at 12 noon.    . furosemide (LASIX) 20 MG tablet Take 20 mg by mouth daily as needed.  1  . gabapentin (NEURONTIN) 600 MG tablet Take 1 tablet (600 mg total) by mouth at bedtime. 90 tablet 3  . hydrocortisone 2.5 % cream Apply 1 application topically daily as needed (rash).   11  . ibuprofen (ADVIL,MOTRIN) 200 MG tablet Take 400 mg by mouth 3 (three) times daily as needed for headache or moderate pain.     Marland Kitchen itraconazole (SPORANOX) 100 MG capsule      . lisinopril-hydrochlorothiazide (PRINZIDE,ZESTORETIC) 20-12.5 MG per tablet Take 1 tablet by mouth daily.     Vladimir Faster Glycol-Propyl Glycol (SYSTANE OP) Place 1 drop into both eyes 3 (three) times daily as needed. Dry eye    . simvastatin (ZOCOR) 20 MG tablet Take 20 mg by mouth every evening.    Cristino Martes Root 500 MG CAPS Take 1 capsule by mouth at bedtime as needed.     No current facility-administered medications on file prior to visit.     Allergies  Allergen Reactions  . Septra [Sulfamethoxazole-Trimethoprim] Hives  . Lotensin [Benazepril Hcl] Other (See Comments)    NOT KNOWN    Objective: Physical Exam  General: Well developed, nourished, no acute distress, awake, alert and oriented x 3  Vascular: Dorsalis pedis artery 1/4 bilateral, Posterior tibial artery 1/4 bilateral, skin temperature warm to warm proximal to distal bilateral lower extremities, ++ varicosities, diminished pedal hair present bilateral.  Neurological: Gross sensation present via light touch bilateral.   Dermatological: Skin is warm, dry, and supple bilateral, Nails 1-10 are tender, short thick, and discolored with mild subungal debris L>R with distal lysis at left 1st toenail and maceration due to constantly wearing and bandaid on toe, no webspace macerations present bilateral, no open lesions present bilateral, no callus/corns/hyperkeratotic tissue present bilateral. No signs  of infection bilateral.  Musculoskeletal:  Asymptomatic hammertoe boney deformities noted bilateral. Muscular strength within normal limits without painon range of motion. No pain with calf compression bilateral.  Assessment and Plan:  Problem List Items Addressed This Visit    None    Visit Diagnoses    Onycholysis    -  Primary   Nail fungus       Relevant Medications   itraconazole (SPORANOX) 100 MG capsule   Toe pain, left          -Examined patient -Discussed treatment options for painful dystrophic nails with  lysis distally at Left great toe -Mechanically debrided nails Left 1-5 toes using sterile nail nipper without incident at no charge -Patient advised for left great toe to refrain from using peroxide on toes and to remove bandaid at left great toe to prevent maceration -Advised patient to soak with Epsom salt PRN -Recommend patient to continue with Itraconazole as given by PCP and to use tea tree oil to nails and advised patient to give time at least 1 year to see results  -Patient to return as needed or sooner if issues arise  Landis Martins, DPM

## 2018-06-02 ENCOUNTER — Ambulatory Visit: Payer: PPO | Admitting: Cardiovascular Disease

## 2018-06-02 ENCOUNTER — Encounter: Payer: Self-pay | Admitting: Cardiovascular Disease

## 2018-06-02 VITALS — BP 124/70 | HR 67 | Ht 62.0 in | Wt 149.0 lb

## 2018-06-02 DIAGNOSIS — I340 Nonrheumatic mitral (valve) insufficiency: Secondary | ICD-10-CM

## 2018-06-02 NOTE — Patient Instructions (Signed)
Medication Instructions:  Your physician recommends that you continue on your current medications as directed. Please refer to the Current Medication list given to you today.  If you need a refill on your cardiac medications before your next appointment, please call your pharmacy.   Lab work: none If you have labs (blood work) drawn today and your tests are completely normal, you will receive your results only by: Marland Kitchen MyChart Message (if you have MyChart) OR . A paper copy in the mail If you have any lab test that is abnormal or we need to change your treatment, we will call you to review the results.  Testing/Procedures: Your physician has requested that you have an echocardiogram. Echocardiography is a painless test that uses sound waves to create images of your heart. It provides your doctor with information about the size and shape of your heart and how well your heart's chambers and valves are working. This procedure takes approximately one hour. There are no restrictions for this procedure. To be done in August 2020  Follow-Up: At [facility], you and your health needs are our priority.  As part of our continuing mission to provide you with exceptional heart care, we have created designated Provider Care Teams.  These Care Teams include your primary Cardiologist (physician) and Advanced Practice Providers (APPs -  Physician Assistants and Nurse Practitioners) who all work together to provide you with the care you need, when you need it. You will need a follow up appointment in 12 months.  Please call our office 2 months in advance to schedule this appointment.  You may see Lauree Chandler, MD or one of the following Advanced Practice Providers on your designated Care Team:   [place], PA-C Melina Copa, PA-C . Ermalinda Barrios, PA-C  Any Other Special Instructions Will Be Listed Below (If Applicable).

## 2018-06-02 NOTE — Progress Notes (Signed)
Chief Complaint  Patient presents with  . Follow-up    mitral regurgitation   History of Present Illness: 85 yo female with history of HTN, HLD, memory disorder, peripheral neuropathy, OA GERD, depression and mitral regurgitation here today for cardiac follow up. I saw her as a new patient in 2016 for evaluation of dizziness, near syncope.  She has no known cardiac disease. She described episodes of dizziness occurring at rest and associated with nausea and diarrhea. No syncope. She had been seen by Neurology and EEG was ok. MRI Brain with chronic changes but no acute stroke. Carotid dopplers with mild bilateral stenosis. Event monitor with sinus rhythm and 1st degree AV block but no other arrhythmias or high grade heart block. She had no chest pain or SOB. Echo December 2018 with [id number]=[id number]%, grade 2 diastolic dysfunction. Moderate mitral regurgitation. She had chronic back pain and leg pain due to spinal stenosis but this resolved after her surgery April 2019.   She is here today for follow up. The patient denies any chest pain, dyspnea, palpitations, lower extremity edema, orthopnea, PND, dizziness, near syncope or syncope. No back pain now after her surgery. She is very active.   Primary Care Physician: Leighton Ruff, MD  Past Medical History:  Diagnosis Date  . Abnormality of gait   . Adenomatous colon polyp   . Arthritis   . Back pain   . Cancer ([place])    skin cancers  . Depression   . Diverticulosis   . DJD (degenerative joint disease) of knee   . Dyslipidemia   . Esophageal reflux   . H/O hiatal hernia   . Heart murmur   . Herpes zoster    throat  . History of shingles    left throat 07/2007  . Hyperlipidemia   . Hypertension   . Memory changes 01/14/2013  . Neuromuscular disorder ([place])    peripheral neuropathy  . Nocturnal leg cramps 07/15/2014  . Peripheral neuropathy   . Polyneuropathy in other diseases classified elsewhere ([place]) 01/14/2013    Past Surgical  History:  Procedure Laterality Date  . ABDOMINAL HYSTERECTOMY    . APPENDECTOMY    . BACK SURGERY  07/2017  . COLON SURGERY     2001 diverticulitis with infection ..drained surgically .  Marland Kitchen EYE SURGERY     cat ext bil   . JOINT REPLACEMENT     right  . KNEE ARTHROSCOPY  11/08/2011  . TONSILLECTOMY    . TOTAL KNEE ARTHROPLASTY  11/07/2011   Procedure: TOTAL KNEE ARTHROPLASTY;  Surgeon: Ninetta Lights, MD;  Location: [facility];  Service: Orthopedics;  Laterality: Left;  . TUBAL LIGATION    . WRIST SURGERY     for skin cancer    Current Outpatient Medications  Medication Sig Dispense Refill  . aspirin 81 MG tablet Take 81 mg by mouth daily.    Marland Kitchen b complex vitamins tablet Take 1 tablet by mouth daily.    . baclofen (LIORESAL) 10 MG tablet Take 1 tablet (10 mg total) by mouth at bedtime. 90 tablet 3  . Calcium Citrate (CITRACAL PO) Take 1 tablet by mouth 2 (two) times daily. + D3    . diclofenac sodium (VOLTAREN) 1 % GEL Apply 1 application topically 3 (three) times daily as needed (pain).     Marland Kitchen esomeprazole (NEXIUM) 20 MG capsule Take 20 mg by mouth daily at 12 noon.    . furosemide (LASIX) 20 MG tablet Take 20 mg by  mouth daily as needed.  1  . gabapentin (NEURONTIN) 600 MG tablet Take 1 tablet (600 mg total) by mouth at bedtime. 90 tablet 3  . hydrocortisone 2.5 % cream Apply 1 application topically daily as needed (rash).   11  . ibuprofen (ADVIL,MOTRIN) 200 MG tablet Take 400 mg by mouth 3 (three) times daily as needed for headache or moderate pain.     Marland Kitchen itraconazole (SPORANOX) 100 MG capsule     . lisinopril-hydrochlorothiazide (PRINZIDE,ZESTORETIC) 20-12.5 MG per tablet Take 1 tablet by mouth daily.     Vladimir Faster Glycol-Propyl Glycol (SYSTANE OP) Place 1 drop into both eyes 3 (three) times daily as needed. Dry eye    . simvastatin (ZOCOR) 20 MG tablet Take 20 mg by mouth every evening.    Cristino Martes Root 500 MG CAPS Take 1 capsule by mouth at bedtime as needed.     No current  facility-administered medications for this visit.     Allergies  Allergen Reactions  . Septra [Sulfamethoxazole-Trimethoprim] Hives  . Lotensin [Benazepril Hcl] Other (See Comments)    NOT KNOWN    Social History   Socioeconomic History  . Marital status: Married    Spouse name: Not on file  . Number of children: 3  . Years of education: hs  . Highest education level: Not on file  Occupational History  . Occupation: retired  Scientific laboratory technician  . Financial resource strain: Not on file  . Food insecurity:    Worry: Not on file    Inability: Not on file  . Transportation needs:    Medical: Not on file    Non-medical: Not on file  Tobacco Use  . Smoking status: Never Smoker  . Smokeless tobacco: Never Used  Substance and Sexual Activity  . Alcohol use: No  . Drug use: No  . Sexual activity: Never  Lifestyle  . Physical activity:    Days per week: Not on file    Minutes per session: Not on file  . Stress: Not on file  Relationships  . Social connections:    Talks on phone: Not on file    Gets together: Not on file    Attends religious service: Not on file    Active member of club or organization: Not on file    Attends meetings of clubs or organizations: Not on file    Relationship status: Not on file  . Intimate partner violence:    Fear of current or ex partner: Not on file    Emotionally abused: Not on file    Physically abused: Not on file    Forced sexual activity: Not on file  Other Topics Concern  . Not on file  Social History Narrative   Husband deceased from lung cancer Jul 01, 2013.   Patient is right handed.   Patient drinks 2 cups of caffeine daily.    Family History  Problem Relation Age of Onset  . Congestive Heart Failure Mother   . Stroke Father 52  . Heart attack Brother 35  . Diabetes Brother     Review of Systems:  As stated in the HPI and otherwise negative.   BP 124/70   Pulse 67   Ht 5\' 2"  (1.575 m)   Wt 67.6 kg   SpO2 99%   BMI  27.25 kg/m   Physical Examination: General: Well developed, well nourished, NAD  HEENT: OP clear, mucus membranes moist  SKIN: warm, dry. No rashes. Neuro: No focal deficits  Musculoskeletal:  Muscle strength 5/5 all ext  Psychiatric: Mood and affect normal  Neck: No JVD, no carotid bruits, no thyromegaly, no lymphadenopathy.  Lungs:Clear bilaterally, no wheezes, rhonci, crackles Cardiovascular: Regular rate and rhythm. Loud systolic murmur at the LLSB.  Abdomen:Soft. Bowel sounds present. Non-tender.  Extremities: No lower extremity edema. Pulses are 2 + in the bilateral DP/PT.  Echo  December 2018: - Left ventricle: The cavity size was normal. Wall thickness was   normal. Systolic function was normal. The estimated ejection   fraction was in the range of 60% to 65%. Wall motion was normal;   there were no regional wall motion abnormalities. Features are   consistent with a pseudonormal left ventricular filling pattern,   with concomitant abnormal relaxation and increased filling   pressure (grade 2 diastolic dysfunction). - Aortic valve: Trileaflet; moderately calcified leaflets. There   was no stenosis. - Aorta: Ascending aortic diameter: 38 mm (S). - Ascending aorta: The ascending aorta was mildly dilated. - Mitral valve: Moderately calcified annulus. Restriction of   posterior leaflet noted. There was eccentric, anteriorly-directed   mitral regurgitation. At least moderate, cannot rule out severe   regurgitation. - Left atrium: The atrium was severely dilated. - Right ventricle: The cavity size was normal. Systolic function   was normal. - Right atrium: The atrium was moderately dilated. - Tricuspid valve: Peak RV-RA gradient (S): 64 mm Hg. - Pulmonary arteries: PA peak pressure: 67 mm Hg (S). - Inferior vena cava: The vessel was normal in size. The   respirophasic diameter changes were in the normal range (= 50%),   consistent with normal central venous  pressure.  Impressions:  - Normal LV size with EF 60-65%, moderate diastolic dysfunction.   Normal RV size and systolic function. Moderate pulmonary   hypertension. The mitral annulus is heavily calcified with some   restriction of the posterior leaflet. There is   anteriorly-directed eccentric mitral regurgitation => at least   moderate, possibly severe. Consider TEE to further evaluate the   mitral valve.  EKG:  EKG is  ordered today. The ekg ordered today demonstrates Sinus rate 67 bpm. 1st degree AV block. Incomplete RBBB. LAFB.   Recent Labs: 07/01/2017: BUN 12; Creatinine, Ser 0.83; Hemoglobin 12.3; Platelets 173; Potassium 3.9; Sodium 139     Wt Readings from Last 3 Encounters:  06/02/18 67.6 kg  02/13/18 64.5 kg  07/03/17 60.8 kg     Other studies Reviewed: Additional studies/ records that were reviewed today include: . Review of the above records demonstrates:    Assessment and Plan:   1. Mitral regurgitation: Moderate by echo in December 2018. She has no dyspnea or dizziness. Will repeat echo. She wishes to wait until August 2020.   Current medicines are reviewed at length with the patient today.  The patient does not have concerns regarding medicines.  The following changes have been made:  no change  Labs/ tests ordered today include:   Orders Placed This Encounter  Procedures  . EKG 12-Lead  . ECHOCARDIOGRAM COMPLETE    Disposition:   FU with me in 12  months  Signed, Lauree Chandler, MD 06/02/2018 4:28 PM    [facility] [place], [place], [place]  [address] Phone: [phone]; Fax: [phone]

## 2018-06-03 DIAGNOSIS — D485 Neoplasm of uncertain behavior of skin: Secondary | ICD-10-CM | POA: Diagnosis not present

## 2018-06-03 DIAGNOSIS — Z23 Encounter for immunization: Secondary | ICD-10-CM | POA: Diagnosis not present

## 2018-06-03 DIAGNOSIS — C44622 Squamous cell carcinoma of skin of right upper limb, including shoulder: Secondary | ICD-10-CM | POA: Diagnosis not present

## 2018-06-03 DIAGNOSIS — L570 Actinic keratosis: Secondary | ICD-10-CM | POA: Diagnosis not present

## 2018-06-24 ENCOUNTER — Ambulatory Visit: Payer: PPO | Admitting: Sports Medicine

## 2018-07-21 DIAGNOSIS — K219 Gastro-esophageal reflux disease without esophagitis: Secondary | ICD-10-CM | POA: Diagnosis not present

## 2018-07-21 DIAGNOSIS — Z8719 Personal history of other diseases of the digestive system: Secondary | ICD-10-CM | POA: Diagnosis not present

## 2018-07-21 DIAGNOSIS — M1990 Unspecified osteoarthritis, unspecified site: Secondary | ICD-10-CM | POA: Diagnosis not present

## 2018-10-09 DIAGNOSIS — E7800 Pure hypercholesterolemia, unspecified: Secondary | ICD-10-CM | POA: Diagnosis not present

## 2018-10-09 DIAGNOSIS — I10 Essential (primary) hypertension: Secondary | ICD-10-CM | POA: Diagnosis not present

## 2018-10-09 DIAGNOSIS — R7303 Prediabetes: Secondary | ICD-10-CM | POA: Diagnosis not present

## 2018-10-09 DIAGNOSIS — M069 Rheumatoid arthritis, unspecified: Secondary | ICD-10-CM | POA: Diagnosis not present

## 2018-10-09 DIAGNOSIS — Z0000 Encounter for general adult medical examination without abnormal findings: Secondary | ICD-10-CM | POA: Diagnosis not present

## 2018-10-09 DIAGNOSIS — M4800 Spinal stenosis, site unspecified: Secondary | ICD-10-CM | POA: Diagnosis not present

## 2018-10-14 DIAGNOSIS — K219 Gastro-esophageal reflux disease without esophagitis: Secondary | ICD-10-CM | POA: Diagnosis not present

## 2018-10-14 DIAGNOSIS — I10 Essential (primary) hypertension: Secondary | ICD-10-CM | POA: Diagnosis not present

## 2018-10-14 DIAGNOSIS — R7303 Prediabetes: Secondary | ICD-10-CM | POA: Diagnosis not present

## 2018-10-14 DIAGNOSIS — Z8719 Personal history of other diseases of the digestive system: Secondary | ICD-10-CM | POA: Diagnosis not present

## 2018-10-14 DIAGNOSIS — E7800 Pure hypercholesterolemia, unspecified: Secondary | ICD-10-CM | POA: Diagnosis not present

## 2018-10-26 ENCOUNTER — Other Ambulatory Visit: Payer: Self-pay

## 2018-10-26 ENCOUNTER — Emergency Department (HOSPITAL_COMMUNITY): Payer: PPO

## 2018-10-26 ENCOUNTER — Inpatient Hospital Stay (HOSPITAL_COMMUNITY)
Admission: EM | Admit: 2018-10-26 | Discharge: 2018-10-31 | DRG: 041 | Disposition: A | Discharge status: Rehabilitation Facility | Payer: PPO | Attending: Neurology | Admitting: Neurology

## 2018-10-26 ENCOUNTER — Inpatient Hospital Stay (HOSPITAL_COMMUNITY): Payer: PPO

## 2018-10-26 ENCOUNTER — Encounter (HOSPITAL_COMMUNITY): Payer: Self-pay | Admitting: Emergency Medicine

## 2018-10-26 DIAGNOSIS — I361 Nonrheumatic tricuspid (valve) insufficiency: Secondary | ICD-10-CM | POA: Diagnosis not present

## 2018-10-26 DIAGNOSIS — Z85828 Personal history of other malignant neoplasm of skin: Secondary | ICD-10-CM | POA: Diagnosis not present

## 2018-10-26 DIAGNOSIS — I69320 Aphasia following cerebral infarction: Secondary | ICD-10-CM | POA: Diagnosis not present

## 2018-10-26 DIAGNOSIS — D62 Acute posthemorrhagic anemia: Secondary | ICD-10-CM | POA: Diagnosis not present

## 2018-10-26 DIAGNOSIS — Z823 Family history of stroke: Secondary | ICD-10-CM

## 2018-10-26 DIAGNOSIS — Z96653 Presence of artificial knee joint, bilateral: Secondary | ICD-10-CM | POA: Diagnosis not present

## 2018-10-26 DIAGNOSIS — I6340 Cerebral infarction due to embolism of unspecified cerebral artery: Secondary | ICD-10-CM | POA: Diagnosis not present

## 2018-10-26 DIAGNOSIS — Z8249 Family history of ischemic heart disease and other diseases of the circulatory system: Secondary | ICD-10-CM | POA: Diagnosis not present

## 2018-10-26 DIAGNOSIS — Z7982 Long term (current) use of aspirin: Secondary | ICD-10-CM

## 2018-10-26 DIAGNOSIS — K219 Gastro-esophageal reflux disease without esophagitis: Secondary | ICD-10-CM | POA: Diagnosis present

## 2018-10-26 DIAGNOSIS — R4701 Aphasia: Secondary | ICD-10-CM | POA: Diagnosis present

## 2018-10-26 DIAGNOSIS — E785 Hyperlipidemia, unspecified: Secondary | ICD-10-CM | POA: Diagnosis present

## 2018-10-26 DIAGNOSIS — I513 Intracardiac thrombosis, not elsewhere classified: Secondary | ICD-10-CM | POA: Diagnosis not present

## 2018-10-26 DIAGNOSIS — E441 Mild protein-calorie malnutrition: Secondary | ICD-10-CM | POA: Diagnosis not present

## 2018-10-26 DIAGNOSIS — I6523 Occlusion and stenosis of bilateral carotid arteries: Secondary | ICD-10-CM | POA: Diagnosis not present

## 2018-10-26 DIAGNOSIS — R29707 NIHSS score 7: Secondary | ICD-10-CM | POA: Diagnosis present

## 2018-10-26 DIAGNOSIS — I69354 Hemiplegia and hemiparesis following cerebral infarction affecting left non-dominant side: Secondary | ICD-10-CM | POA: Diagnosis not present

## 2018-10-26 DIAGNOSIS — R29898 Other symptoms and signs involving the musculoskeletal system: Secondary | ICD-10-CM

## 2018-10-26 DIAGNOSIS — Z833 Family history of diabetes mellitus: Secondary | ICD-10-CM | POA: Diagnosis not present

## 2018-10-26 DIAGNOSIS — R54 Age-related physical debility: Secondary | ICD-10-CM | POA: Diagnosis present

## 2018-10-26 DIAGNOSIS — K222 Esophageal obstruction: Secondary | ICD-10-CM | POA: Diagnosis not present

## 2018-10-26 DIAGNOSIS — I10 Essential (primary) hypertension: Secondary | ICD-10-CM | POA: Diagnosis not present

## 2018-10-26 DIAGNOSIS — Z90710 Acquired absence of both cervix and uterus: Secondary | ICD-10-CM | POA: Diagnosis not present

## 2018-10-26 DIAGNOSIS — E876 Hypokalemia: Secondary | ICD-10-CM | POA: Diagnosis present

## 2018-10-26 DIAGNOSIS — D696 Thrombocytopenia, unspecified: Secondary | ICD-10-CM | POA: Diagnosis not present

## 2018-10-26 DIAGNOSIS — Z888 Allergy status to other drugs, medicaments and biological substances status: Secondary | ICD-10-CM | POA: Diagnosis not present

## 2018-10-26 DIAGNOSIS — W19XXXA Unspecified fall, initial encounter: Secondary | ICD-10-CM | POA: Diagnosis present

## 2018-10-26 DIAGNOSIS — S61412A Laceration without foreign body of left hand, initial encounter: Secondary | ICD-10-CM | POA: Diagnosis not present

## 2018-10-26 DIAGNOSIS — Z96651 Presence of right artificial knee joint: Secondary | ICD-10-CM | POA: Diagnosis not present

## 2018-10-26 DIAGNOSIS — I272 Pulmonary hypertension, unspecified: Secondary | ICD-10-CM | POA: Diagnosis present

## 2018-10-26 DIAGNOSIS — R531 Weakness: Secondary | ICD-10-CM | POA: Diagnosis not present

## 2018-10-26 DIAGNOSIS — Z79899 Other long term (current) drug therapy: Secondary | ICD-10-CM | POA: Diagnosis not present

## 2018-10-26 DIAGNOSIS — I081 Rheumatic disorders of both mitral and tricuspid valves: Secondary | ICD-10-CM | POA: Diagnosis not present

## 2018-10-26 DIAGNOSIS — I63413 Cerebral infarction due to embolism of bilateral middle cerebral arteries: Principal | ICD-10-CM | POA: Diagnosis present

## 2018-10-26 DIAGNOSIS — I340 Nonrheumatic mitral (valve) insufficiency: Secondary | ICD-10-CM | POA: Diagnosis not present

## 2018-10-26 DIAGNOSIS — G8194 Hemiplegia, unspecified affecting left nondominant side: Secondary | ICD-10-CM | POA: Diagnosis not present

## 2018-10-26 DIAGNOSIS — I6389 Other cerebral infarction: Secondary | ICD-10-CM | POA: Diagnosis not present

## 2018-10-26 DIAGNOSIS — I63411 Cerebral infarction due to embolism of right middle cerebral artery: Secondary | ICD-10-CM | POA: Diagnosis not present

## 2018-10-26 DIAGNOSIS — M6281 Muscle weakness (generalized): Secondary | ICD-10-CM | POA: Diagnosis not present

## 2018-10-26 DIAGNOSIS — S61512A Laceration without foreign body of left wrist, initial encounter: Secondary | ICD-10-CM | POA: Diagnosis not present

## 2018-10-26 DIAGNOSIS — Z20828 Contact with and (suspected) exposure to other viral communicable diseases: Secondary | ICD-10-CM | POA: Diagnosis not present

## 2018-10-26 DIAGNOSIS — I639 Cerebral infarction, unspecified: Secondary | ICD-10-CM | POA: Diagnosis present

## 2018-10-26 DIAGNOSIS — G629 Polyneuropathy, unspecified: Secondary | ICD-10-CM | POA: Diagnosis present

## 2018-10-26 DIAGNOSIS — F329 Major depressive disorder, single episode, unspecified: Secondary | ICD-10-CM | POA: Diagnosis not present

## 2018-10-26 DIAGNOSIS — R295 Transient paralysis: Secondary | ICD-10-CM | POA: Diagnosis not present

## 2018-10-26 DIAGNOSIS — E46 Unspecified protein-calorie malnutrition: Secondary | ICD-10-CM | POA: Diagnosis not present

## 2018-10-26 DIAGNOSIS — I69390 Apraxia following cerebral infarction: Secondary | ICD-10-CM | POA: Diagnosis not present

## 2018-10-26 DIAGNOSIS — I69318 Other symptoms and signs involving cognitive functions following cerebral infarction: Secondary | ICD-10-CM | POA: Diagnosis not present

## 2018-10-26 LAB — COMPREHENSIVE METABOLIC PANEL
ALT: 17 U/L (ref 0–44)
AST: 27 U/L (ref 15–41)
Albumin: 3.7 g/dL (ref 3.5–5.0)
Alkaline Phosphatase: 46 U/L (ref 38–126)
Anion gap: 11 (ref 5–15)
BUN: 12 mg/dL (ref 8–23)
CO2: 23 mmol/L (ref 22–32)
Calcium: 8.9 mg/dL (ref 8.9–10.3)
Chloride: 99 mmol/L (ref 98–111)
Creatinine, Ser: 0.77 mg/dL (ref 0.44–1.00)
GFR calc Af Amer: >60 mL/min (ref >60)
GFR calc non Af Amer: >60 mL/min (ref >60)
Glucose, Bld: 110 mg/dL — ABNORMAL HIGH (ref 70–99)
Potassium: 3.4 mmol/L — ABNORMAL LOW (ref 3.5–5.1)
Sodium: 133 mmol/L — ABNORMAL LOW (ref 135–145)
Total Bilirubin: 0.8 mg/dL (ref 0.3–1.2)
Total Protein: 6.3 g/dL — ABNORMAL LOW (ref 6.5–8.1)

## 2018-10-26 LAB — LIPID PANEL
Cholesterol: 149 mg/dL (ref 0–200)
HDL: 55 mg/dL (ref >40)
LDL Cholesterol: 84 mg/dL (ref 0–99)
Total CHOL/HDL Ratio: 2.7 RATIO
Triglycerides: 52 mg/dL (ref <150)
VLDL: 10 mg/dL (ref 0–40)

## 2018-10-26 LAB — CBC
HCT: 34.8 % — ABNORMAL LOW (ref 36.0–46.0)
Hemoglobin: 11.9 g/dL — ABNORMAL LOW (ref 12.0–15.0)
MCH: 30.1 pg (ref 26.0–34.0)
MCHC: 34.2 g/dL (ref 30.0–36.0)
MCV: 88.1 fL (ref 80.0–100.0)
Platelets: 64 10*3/uL — ABNORMAL LOW (ref 150–400)
RBC: 3.95 MIL/uL (ref 3.87–5.11)
RDW: 13.5 % (ref 11.5–15.5)
WBC: 4 10*3/uL (ref 4.0–10.5)
nRBC: 2.8 % — ABNORMAL HIGH (ref 0.0–0.2)

## 2018-10-26 LAB — I-STAT CHEM 8, ED
BUN: 15 mg/dL (ref 8–23)
Calcium, Ion: 1.11 mmol/L — ABNORMAL LOW (ref 1.15–1.40)
Chloride: 99 mmol/L (ref 98–111)
Creatinine, Ser: 0.8 mg/dL (ref 0.44–1.00)
Glucose, Bld: 105 mg/dL — ABNORMAL HIGH (ref 70–99)
HCT: 33 % — ABNORMAL LOW (ref 36.0–46.0)
Hemoglobin: 11.2 g/dL — ABNORMAL LOW (ref 12.0–15.0)
Potassium: 3.7 mmol/L (ref 3.5–5.1)
Sodium: 136 mmol/L (ref 135–145)
TCO2: 27 mmol/L (ref 22–32)

## 2018-10-26 LAB — DIFFERENTIAL
Abs Immature Granulocytes: 0.05 10*3/uL (ref 0.00–0.07)
Basophils Absolute: 0 10*3/uL (ref 0.0–0.1)
Basophils Relative: 1 %
Eosinophils Absolute: 0.1 10*3/uL (ref 0.0–0.5)
Eosinophils Relative: 3 %
Immature Granulocytes: 1 %
Lymphocytes Relative: 37 %
Lymphs Abs: 1.6 10*3/uL (ref 0.7–4.0)
Monocytes Absolute: 0.5 10*3/uL (ref 0.1–1.0)
Monocytes Relative: 11 %
Neutro Abs: 2.1 10*3/uL (ref 1.7–7.7)
Neutrophils Relative %: 47 %

## 2018-10-26 LAB — SARS CORONAVIRUS 2 BY RT PCR (HOSPITAL ORDER, PERFORMED IN ~~LOC~~ HOSPITAL LAB): SARS Coronavirus 2: NEGATIVE

## 2018-10-26 LAB — HEMOGLOBIN A1C
Hgb A1c MFr Bld: 5.8 % — ABNORMAL HIGH (ref 4.8–5.6)
Mean Plasma Glucose: 119.76 mg/dL

## 2018-10-26 LAB — PROTIME-INR
INR: 1.2 (ref 0.8–1.2)
Prothrombin Time: 15.2 seconds (ref 11.4–15.2)

## 2018-10-26 LAB — ETHANOL: Alcohol, Ethyl (B): <10 mg/dL (ref <10)

## 2018-10-26 LAB — MRSA PCR SCREENING: MRSA by PCR: NEGATIVE

## 2018-10-26 LAB — APTT: aPTT: 35 seconds (ref 24–36)

## 2018-10-26 LAB — CBG MONITORING, ED: Glucose-Capillary: 108 mg/dL — ABNORMAL HIGH (ref 70–99)

## 2018-10-26 MED ORDER — SODIUM CHLORIDE 0.9 % IV SOLN
INTRAVENOUS | Status: DC
  Administered 2018-10-28 – 2018-10-29 (×2): via INTRAVENOUS

## 2018-10-26 MED ORDER — PANTOPRAZOLE SODIUM 40 MG IV SOLR: 40.0000 mg | Freq: Every day | INTRAVENOUS | Status: DC

## 2018-10-26 MED ORDER — POTASSIUM CHLORIDE CRYS ER 20 MEQ PO TBCR
40.0000 meq | EXTENDED_RELEASE_TABLET | ORAL | Status: AC
  Administered 2018-10-26 (×2): 40 meq via ORAL
  Filled 2018-10-26 (×2): qty 2

## 2018-10-26 MED ORDER — PANTOPRAZOLE SODIUM 40 MG PO TBEC
40.0000 mg | DELAYED_RELEASE_TABLET | Freq: Every day | ORAL | Status: DC
  Administered 2018-10-26 – 2018-10-31 (×6): 40 mg via ORAL
  Filled 2018-10-26 (×7): qty 1

## 2018-10-26 MED ORDER — NICARDIPINE HCL IN NACL 20-0.86 MG/200ML-% IV SOLN: 0.0000 mg/h | INTRAVENOUS | Status: DC | PRN

## 2018-10-26 MED ORDER — ACETAMINOPHEN 325 MG PO TABS
650.0000 mg | ORAL_TABLET | ORAL | Status: DC | PRN
  Administered 2018-10-26 – 2018-10-30 (×6): 650 mg via ORAL
  Filled 2018-10-26 (×6): qty 2

## 2018-10-26 MED ORDER — BACLOFEN 10 MG PO TABS
10.0000 mg | ORAL_TABLET | Freq: Every day | ORAL | Status: DC
  Administered 2018-10-26 – 2018-10-30 (×5): 10 mg via ORAL
  Filled 2018-10-26 (×5): qty 1

## 2018-10-26 MED ORDER — GABAPENTIN 300 MG PO CAPS
600.0000 mg | ORAL_CAPSULE | Freq: Every day | ORAL | Status: DC
  Administered 2018-10-26 – 2018-10-30 (×5): 600 mg via ORAL
  Filled 2018-10-26 (×5): qty 2

## 2018-10-26 MED ORDER — ACETAMINOPHEN 160 MG/5ML PO SOLN: 650.0000 mg | ORAL | Status: DC | PRN

## 2018-10-26 MED ORDER — IOHEXOL 350 MG/ML SOLN
75.0000 mL | Freq: Once | INTRAVENOUS | Status: AC | PRN
  Administered 2018-10-26: 75 mL via INTRAVENOUS

## 2018-10-26 MED ORDER — ALTEPLASE (STROKE) FULL DOSE INFUSION
0.9000 mg/kg | Freq: Once | INTRAVENOUS | Status: AC
  Administered 2018-10-26: 61.5 mg via INTRAVENOUS
  Filled 2018-10-26: qty 100

## 2018-10-26 MED ORDER — STROKE: EARLY STAGES OF RECOVERY BOOK
Freq: Once | Status: DC
  Filled 2018-10-26: qty 1

## 2018-10-26 MED ORDER — ACETAMINOPHEN 650 MG RE SUPP: 650.0000 mg | RECTAL | Status: DC | PRN

## 2018-10-26 MED ORDER — SODIUM CHLORIDE 0.9 % IV SOLN: 50.0000 mL | Freq: Once | INTRAVENOUS | Status: DC

## 2018-10-26 MED ORDER — SODIUM CHLORIDE 0.9 % IV SOLN
50.0000 mL/h | INTRAVENOUS | Status: DC
  Administered 2018-10-26: 50 mL/h via INTRAVENOUS

## 2018-10-26 MED ORDER — SIMVASTATIN 20 MG PO TABS
20.0000 mg | ORAL_TABLET | Freq: Every evening | ORAL | Status: DC
  Administered 2018-10-26 – 2018-10-31 (×6): 20 mg via ORAL
  Filled 2018-10-26 (×6): qty 1

## 2018-10-26 MED ORDER — B COMPLEX-C PO TABS
1.0000 | ORAL_TABLET | Freq: Every day | ORAL | Status: DC
  Filled 2018-10-26: qty 1

## 2018-10-26 MED ORDER — LABETALOL HCL 5 MG/ML IV SOLN: 10.0000 mg | Freq: Once | INTRAVENOUS | Status: DC | PRN

## 2018-10-26 MED ORDER — PRAVASTATIN SODIUM 10 MG PO TABS: 20.0000 mg | ORAL_TABLET | Freq: Every day | ORAL | Status: DC

## 2018-10-26 MED ORDER — B COMPLEX-C PO TABS
1.0000 | ORAL_TABLET | Freq: Every day | ORAL | Status: DC
  Administered 2018-10-26 – 2018-10-31 (×6): 1 via ORAL
  Filled 2018-10-26 (×7): qty 1

## 2018-10-26 NOTE — Progress Notes (Signed)
PT Cancellation Note  Patient Details Name: GILBERT NARAIN MRN: [id number] DOB: Aug 22, 1931   Cancelled Treatment:    Reason Eval/Treat Not Completed: Active bedrest order. TPA received 7/26 0156.   Lorriane Shire 10/26/2018, 7:55 AM   Lorrin Goodell, PT  Office # [phone] Pager [phone]

## 2018-10-26 NOTE — ED Triage Notes (Signed)
Pt BIB [facility] from home, LSN 2300 on 7/25. Reports left arm weakness and numbness. A&O x 4 on arrival, moves other extremities well.

## 2018-10-26 NOTE — Progress Notes (Signed)
  Echocardiogram 2D Echocardiogram has been performed.  Morgan Boyer 10/26/2018, 6:17 PM

## 2018-10-26 NOTE — Progress Notes (Signed)
PHARMACIST CODE STROKE RESPONSE  Notified to mix tPA at 0149 by Dr. Leonel Ramsay Delivered tPA to RN at [phone]  tPA dose = 6.2mg  bolus over 1 minute followed by 55.3mg  for a total dose of 61.5mg  over 1 hour  Issues/delays encountered (if applicable): none   Arrie Senate, PharmD, BCPS Clinical Pharmacist Please check AMION for all [facility] numbers 10/26/2018

## 2018-10-26 NOTE — H&P (Signed)
Neurology H&P  CC: Left-sided weakness  History is obtained from:Patient  HPI: Morgan Boyer is a 85 y.o. female with a history of hypertension, hyperlipidemia, neuropathy who presents with left-sided weakness that started abruptly.  She states that she got up and made herself some popcorn and at that time her arm was working completely normally, and then suddenly he went to lift up her left arm and found it to be "quite heavy"   LKW: 11pm tpa given?:  Yes IR Thrombectomy? No, no LVO Modified Rankin Scale: 0-Completely asymptomatic and back to baseline post- stroke NIHSS: 7   ROS: A complete ROS was performed and is negative except as noted in the HPI.   Past Medical History:  Diagnosis Date  . Abnormality of gait   . Adenomatous colon polyp   . Arthritis   . Back pain   . Cancer ([place])    skin cancers  . Depression   . Diverticulosis   . DJD (degenerative joint disease) of knee   . Dyslipidemia   . Esophageal reflux   . H/O hiatal hernia   . Heart murmur   . Herpes zoster    throat  . History of shingles    left throat 07/2007  . Hyperlipidemia   . Hypertension   . Memory changes 01/14/2013  . Neuromuscular disorder ([place])    peripheral neuropathy  . Nocturnal leg cramps 07/15/2014  . Peripheral neuropathy   . Polyneuropathy in other diseases classified elsewhere ([place]) 01/14/2013     Family History  Problem Relation Age of Onset  . Congestive Heart Failure Mother   . Stroke Father 56  . Heart attack Brother 29  . Diabetes Brother      Social History:  reports that she has never smoked. She has never used smokeless tobacco. She reports that she does not drink alcohol or use drugs.   Prior to Admission medications   Medication Sig Start Date End Date Taking? Authorizing Provider  aspirin 81 MG tablet Take 81 mg by mouth daily.    [provider]  b complex vitamins tablet Take 1 tablet by mouth daily.    [provider]  baclofen  (LIORESAL) 10 MG tablet Take 1 tablet (10 mg total) by mouth at bedtime. 02/13/18   Ward Givens, NP  Calcium Citrate (CITRACAL PO) Take 1 tablet by mouth 2 (two) times daily. + D3    [provider]  diclofenac sodium (VOLTAREN) 1 % GEL Apply 1 application topically 3 (three) times daily as needed (pain).  02/19/17   [provider]  esomeprazole (NEXIUM) 20 MG capsule Take 20 mg by mouth daily at 12 noon.    [provider]  furosemide (LASIX) 20 MG tablet Take 20 mg by mouth daily as needed. 02/10/18   [provider]  gabapentin (NEURONTIN) 600 MG tablet Take 1 tablet (600 mg total) by mouth at bedtime. 02/13/18   Ward Givens, NP  hydrocortisone 2.5 % cream Apply 1 application topically daily as needed (rash).  08/22/15   [provider]  ibuprofen (ADVIL,MOTRIN) 200 MG tablet Take 400 mg by mouth 3 (three) times daily as needed for headache or moderate pain.     [provider]  itraconazole (SPORANOX) 100 MG capsule  05/05/18   [provider]  lisinopril-hydrochlorothiazide (PRINZIDE,ZESTORETIC) 20-12.5 MG per tablet Take 1 tablet by mouth daily.     [provider]  Polyethyl Glycol-Propyl Glycol (SYSTANE OP) Place 1 drop into both eyes  3 (three) times daily as needed. Dry eye    [provider]  simvastatin (ZOCOR) 20 MG tablet Take 20 mg by mouth every evening.    [provider]  Valerian Root 500 MG CAPS Take 1 capsule by mouth at bedtime as needed.    [provider]     Exam: Current vital signs: Wt 68.3 kg   BMI 27.54 kg/m  Vitals:   10/26/18 0230 10/26/18 0239  BP: (!) 162/93   Pulse: 73 83  Resp: 14 (!) 22  SpO2: 98% 99%     Physical Exam  Constitutional: Appears well-developed and well-nourished.  Psych: Affect appropriate to situation Eyes: No scleral injection HENT: No OP obstrucion Head: Normocephalic.  Cardiovascular: Normal rate and regular rhythm.   Respiratory: Effort normal and breath sounds normal to anterior ascultation GI: Soft.  No distension. There is no tenderness.  Skin: WDI  Neuro: Mental Status: Patient is awake, alert, oriented to person, place, month, year, and situation. Patient is able to give a clear and coherent history. No signs of aphasia or neglect Cranial Nerves: II: Visual Fields are full. Pupils are equal, round, and reactive to light.   III,IV, VI: EOMI without ptosis or diploplia.  V: Facial sensation is symmetric to temperature, no extinction on the face VII: Facial movement is symmetric.  VIII: hearing is intact to voice X: Uvula elevates symmetrically XI: Shoulder shrug is symmetric. XII: tongue is midline without atrophy or fasciculations.  Motor: Tone is normal. Bulk is normal. 5/5 strength was present on the right side, she has almost no movement in the left arm and 4/5 strength of the left leg Sensory: Sensation is diminished in the left arm and leg profoundly, cannot test extinction there Cerebellar: No clear ataxia on the right   I have reviewed labs in epic and the pertinent results are: Creatinine 0.8  I have reviewed the images obtained: CT/CTA-negative  Primary Diagnosis:  Cerebral infarction, unspecified.  Secondary Diagnosis: Essential (primary) hypertension   Impression: 85 year old female with acute left-sided weakness most consistent with ischemic stroke.  She was within the IV TPA window, and had no contraindications so after discussion of risks and benefits with the patient, she consented and we proceeded with IV TPA.    Plan: Stroke: - HgbA1c, fasting lipid panel - MRI, MRA  of the brain without contrast - Frequent neuro checks - Echocardiogram - Carotid dopplers - Prophylactic therapy-Antiplatelet med: Aspirin - dose 325mg  PO or 300mg  PR - Risk factor modification - Telemetry monitoring - PT consult, OT consult, Speech consult - Stroke team to  follow  Peripheral neuropathy: Continue home gabapentin nightly  This patient is critically ill and at significant risk of neurological worsening, death and care requires constant monitoring of vital signs, hemodynamics,respiratory and cardiac monitoring, neurological assessment, discussion with family, other specialists and medical decision making of high complexity. I spent 65 minutes of neurocritical care time  in the care of  this patient. This was time spent independent of any time provided by nurse practitioner or PA.  Roland Rack, MD [facility] [phone]  If 7pm- 7am, please page neurology on call as listed in [place].

## 2018-10-26 NOTE — Progress Notes (Signed)
Pt found on side of bed with a large skin tear to the top of left hand. Skin tear was cleaned with NS & then a non adhesive dressing was applied. Dressing secured with kerlix with extra kerlix applied to top of hand for padding. Extremity place in mitten to help prevent further damage. Physician notified and order for waste belt received.

## 2018-10-26 NOTE — Code Documentation (Signed)
Responded to code stroke called at [facility] for L arm paralysis. Pt arrived at [facility], CT negative for acute changes, CBG 108, LSN 7/25 2300. TPA started at 0156 plan to admit to stroke team.

## 2018-10-26 NOTE — Progress Notes (Signed)
STROKE TEAM PROGRESS NOTE   SUBJECTIVE (INTERVAL HISTORY) Her RN is at the bedside.  Pt still has left UE weakness, otherwise neuro intact. On visual field testing, she seems to have right upper quadrant decreased visual acuity but she said she had b/l cataract surgery in the past. BP stable   OBJECTIVE Vitals:   10/26/18 0530 10/26/18 0600 10/26/18 0630 10/26/18 0700  BP: (!) 156/97 (!) 159/89 (!) 154/109 (!) 166/92  Pulse: 68 72 76 74  Resp: 13 18 (!) 24 18  Temp:      TempSrc:      SpO2: 99% 98% 99% 96%  Weight:      Height:        CBC:  Recent Labs  Lab 10/26/18 0146 10/26/18 0147  WBC 4.0  --   NEUTROABS 2.1  --   HGB 11.9* 11.2*  HCT 34.8* 33.0*  MCV 88.1  --   PLT 64*  --     Basic Metabolic Panel:  Recent Labs  Lab 10/26/18 0146 10/26/18 0147 10/26/18 0238  NA SPECIMEN HEMOLYZED. HEMOLYSIS MAY AFFECT INTEGRITY OF RESULTS. [address] HEMOLYSIS MAY AFFECT INTEGRITY OF RESULTS. 3.7 3.4*  CL SPECIMEN HEMOLYZED. HEMOLYSIS MAY AFFECT INTEGRITY OF RESULTS. 99 99  CO2 SPECIMEN HEMOLYZED. HEMOLYSIS MAY AFFECT INTEGRITY OF RESULTS.  --  23  GLUCOSE SPECIMEN HEMOLYZED. HEMOLYSIS MAY AFFECT INTEGRITY OF RESULTS. 105* 110*  BUN SPECIMEN HEMOLYZED. HEMOLYSIS MAY AFFECT INTEGRITY OF RESULTS. 15 12  CREATININE SPECIMEN HEMOLYZED. HEMOLYSIS MAY AFFECT INTEGRITY OF RESULTS. 0.80 0.77  CALCIUM SPECIMEN HEMOLYZED. HEMOLYSIS MAY AFFECT INTEGRITY OF RESULTS.  --  8.9    Lipid Panel:     Component Value Date/Time   CHOL 149 10/26/2018 0541   TRIG 52 10/26/2018 0541   HDL 55 10/26/2018 0541   CHOLHDL 2.7 10/26/2018 0541   VLDL 10 10/26/2018 0541   LDLCALC 84 10/26/2018 0541   HgbA1c:  Lab Results  Component Value Date   HGBA1C 5.8 (H) 10/26/2018   Urine Drug Screen: No results found for: LABOPIA, COCAINSCRNUR, LABBENZ, AMPHETMU, THCU, LABBARB  Alcohol Level     Component Value Date/Time   ETH <10 10/26/2018 0146    IMAGING  Ct Angio Head W  Or Wo Contrast Ct Angio Neck W Or Wo Contrast 10/26/2018 IMPRESSION:  1. No emergent large vessel occlusion or hemodynamically significant stenosis.  2. Mild bilateral carotid bifurcation atherosclerosis with less than 50% stenosis.  3. Aortic atherosclerosis (ICD10-I70.0).  Ct Head Code Stroke Wo Contrast 10/26/2018 IMPRESSION:  1. No acute hemorrhage.  2. ASPECTS is 10.    MRI Brain WO Contrast - pending   Transthoracic Echocardiogram  00/00/2020 Pending   ECG - pending   PHYSICAL EXAM  Temp:  [98.1 F (36.7 C)-98.3 F (36.8 C)] 98.3 F (36.8 C) (07/26 0800) Pulse Rate:  [68-83] 77 (07/26 1100) Resp:  [13-33] 14 (07/26 1100) BP: (144-172)/(66-109) 154/85 (07/26 1100) SpO2:  [92 %-100 %] 98 % (07/26 1100) Weight:  [68 kg-68.3 kg] 68 kg (07/26 0315)  General - Well nourished, well developed, in no apparent distress.  Ophthalmologic - fundi not visualized due to noncooperation.  Cardiovascular - Regular rate and rhythm.  Mental Status -  Level of arousal and orientation to time, place, and person were intact. Language including expression, naming, repetition, comprehension was assessed and found intact. Fund of Knowledge was assessed and was intact.  Cranial Nerves II - XII - II - right upper quandrant visual field inconsistent  on exam. III, IV, VI - Extraocular movements intact. V - Facial sensation intact bilaterally. VII - Facial movement intact bilaterally. VIII - Hearing & vestibular intact bilaterally. X - Palate elevates symmetrically. XI - Chin turning & shoulder shrug intact bilaterally. XII - Tongue protrusion intact.  Motor Strength - The patient's strength was normal in all extremities except LUE 3/5 proximal and 2/5 finger movement.  Bulk was normal and fasciculations were absent.   Motor Tone - Muscle tone was assessed at the neck and appendages and was normal.  Reflexes - The patient's reflexes were symmetrical in all extremities and she had  no pathological reflexes.  Sensory - Light touch, temperature/pinprick were assessed and were symmetrical.    Coordination - The patient had normal movements in the right hand with no ataxia or dysmetria.  Tremor was absent.  Gait and Station - deferred.   ASSESSMENT/PLAN Morgan Boyer is a 85 y.o. female with history of hypertension, hyperlipidemia, neuropathy who presents with left-sided weakness that started abruptly.  She received IV t-PA on Sunday 10/26/2018 at 0300.  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  CTA H&N - Mild bilateral carotid bifurcation atherosclerosis with less than 50% stenosis.   MRI pending  2D Echo - pending  Morgan Boyer Virus 2  - negative  LDL - 84    HgbA1c - 5.8  UDS - pending  VTE prophylaxis - SCDs  Diet - regular  aspirin 81 mg daily prior to admission, now on No antithrombotic within 24 hr s/p tPA  Patient counseled to be compliant with her antithrombotic medications  Ongoing aggressive stroke risk factor management  Therapy recommendations:  pending  Disposition:  Pending  Hypertension  Blood pressure somewhat high at times but within post stroke parameters . Permissive hypertension (OK if < 180/105) but gradually normalize in 3-5 days . Long-term BP goal normotensive  Hyperlipidemia  Lipid lowering medication PTA:  Zocor 20 mg daily  LDL 84, goal < 70  Current lipid lowering medication:  Zocor 20 mg daily  Continue statin at discharge  Thrombocytopenia   Platelet 64, baseline 150-200  Close monitoring  ASA 81 at home  Other Stroke Risk Factors  Advanced age  Family hx stroke (father)  Other Active Problems  Hypokalemia - 3.4 (hemolyzed - probably lower) - supplemented   Hospital day # 0  This patient is critically ill due to stroke s/p tPA, thrombocytopenia, HTN and at significant risk of neurological worsening, death  form recurrent stroke, hemorrhagic conversion, bleeding. This patient's care requires constant monitoring of vital signs, hemodynamics, respiratory and cardiac monitoring, review of multiple databases, neurological assessment, discussion with family, other specialists and medical decision making of high complexity. I spent 35 minutes of neurocritical care time in the care of this patient.  Rosalin Hawking, MD PhD Stroke Neurology 10/26/2018 12:16 PM  To contact Stroke Continuity provider, please refer to [email or web address] After hours, contact General Neurology

## 2018-10-26 NOTE — ED Provider Notes (Signed)
[facility] NEURO/TRAUMA/SURGICAL ICU Provider Note  CSN: [id number] Arrival date & time: 10/26/18 0139  Chief Complaint(s) Code Stroke  HPI Morgan Boyer is a 85 y.o. female    Weakness Severity:  Moderate Onset quality:  Sudden Duration:  3 hours Timing:  Constant Progression:  Unchanged Chronicity:  New Relieved by:  Nothing Worsened by:  Nothing Associated symptoms: stroke symptoms   Associated symptoms: no abdominal pain, no chest pain, no difficulty walking, no dizziness, no drooling, no dysphagia, no falls, no fever, no headaches, no loss of consciousness, no nausea and no vision change     Past Medical History Past Medical History:  Diagnosis Date   Abnormality of gait    Adenomatous colon polyp    Arthritis    Back pain    Cancer (HCC)    skin cancers   Depression    Diverticulosis    DJD (degenerative joint disease) of knee    Dyslipidemia    Esophageal reflux    H/O hiatal hernia    Heart murmur    Herpes zoster    throat   History of shingles    left throat 07/2007   Hyperlipidemia    Hypertension    Memory changes 01/14/2013   Neuromuscular disorder ([place])    peripheral neuropathy   Nocturnal leg cramps 07/15/2014   Peripheral neuropathy    Polyneuropathy in other diseases classified elsewhere ([place]) 01/14/2013   Patient Active Problem List   Diagnosis Date Noted   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 Authorizing Provider  aspirin 81 MG tablet Take 81 mg by mouth daily.   Yes  [provider]  b complex vitamins tablet Take 1 tablet by mouth daily.   Yes [provider]  baclofen (LIORESAL) 10 MG tablet Take 1 tablet (10 mg total) by mouth at bedtime. 02/13/18  Yes Ward Givens, NP  Calcium Citrate (CITRACAL PO) Take 1 tablet by mouth 2 (two) times daily. + D3   Yes [provider]  diclofenac sodium (VOLTAREN) 1 % GEL Apply 1 application topically 3 (three) times daily as needed (pain).  02/19/17  Yes [provider]  esomeprazole (NEXIUM) 20 MG capsule Take 20 mg by mouth daily.    Yes [provider]  furosemide (LASIX) 20 MG tablet Take 20 mg by mouth daily as needed for fluid.  02/10/18  Yes [provider]  gabapentin (NEURONTIN) 600 MG tablet Take 1 tablet (600 mg total) by mouth at bedtime. 02/13/18  Yes Ward Givens, NP  hydrocortisone 2.5 % cream Apply 1 application topically daily as needed (rash).  08/22/15  Yes [provider]  ibuprofen (ADVIL,MOTRIN) 200 MG tablet Take 400 mg by mouth 3 (three) times daily as needed for headache or moderate pain.    Yes [provider]  lisinopril-hydrochlorothiazide (PRINZIDE,ZESTORETIC) 20-12.5 MG per tablet Take 1 tablet by mouth daily.    Yes [provider]  Polyethyl Glycol-Propyl Glycol (SYSTANE OP) Place 1 drop into both eyes 3 (three) times daily as needed (for dry eyes).    Yes  [provider]  simvastatin (ZOCOR) 20 MG tablet Take 20 mg by mouth every evening.   Yes [provider]  Valerian Root 500 MG CAPS Take 1 capsule by mouth at bedtime as needed (for stress).    Yes [provider]                                                                                                                                    Past Surgical History Past Surgical History:  Procedure Laterality Date   ABDOMINAL HYSTERECTOMY     APPENDECTOMY     BACK SURGERY  07/2017   COLON SURGERY     2001 diverticulitis  with infection ..drained surgically .   EYE SURGERY     cat ext bil    JOINT REPLACEMENT     right   KNEE ARTHROSCOPY  11/08/2011   TONSILLECTOMY     TOTAL KNEE ARTHROPLASTY  11/07/2011   Procedure: TOTAL KNEE ARTHROPLASTY;  Surgeon: Ninetta Lights, MD;  Location: [facility];  Service: Orthopedics;  Laterality: Left;   TUBAL LIGATION     WRIST SURGERY     for skin cancer   Family History Family History  Problem Relation Age of Onset   Congestive Heart Failure Mother    Stroke Father 22   Heart attack Brother 65   Diabetes Brother     Social History Social History   Tobacco Use   Smoking status: Never Smoker   Smokeless tobacco: Never Used  Substance Use Topics   Alcohol use: No   Drug use: No   Allergies Septra [sulfamethoxazole-trimethoprim] and Lotensin [benazepril hcl]  Review of Systems Review of Systems  Constitutional: Negative for fever.  HENT: Negative for drooling.   Cardiovascular: Negative for chest pain.  Gastrointestinal: Negative for abdominal pain, dysphagia and nausea.  Musculoskeletal: Negative for falls.  Neurological: Positive for weakness. Negative for dizziness, loss of consciousness and headaches.   All other systems are reviewed and are negative for acute change except as noted in the HPI  Physical Exam Vital Signs  I have reviewed the triage vital signs BP (!) 173/95 (BP Location: Left Arm)    Pulse 79    Temp 98.6 F (37 C) (Oral)    Resp 14    Ht 5\' 2"  (1.575 m)    Wt 68 kg    SpO2 97%    BMI 27.42 kg/m   Physical Exam Vitals signs reviewed.  Constitutional:      General: She is not in acute distress.    Appearance: She is well-developed. She is not diaphoretic.  HENT:     Head: Normocephalic and atraumatic.     Nose: Nose normal.  Eyes:     General: No scleral icterus.       Right eye: No discharge.        Left eye: No discharge.     Conjunctiva/sclera: Conjunctivae normal.  Pupils: Pupils are equal, round, and  reactive to light.  Neck:     Musculoskeletal: Normal range of motion and neck supple.  Cardiovascular:     Rate and Rhythm: Normal rate and regular rhythm.     Heart sounds: No murmur. No friction rub. No gallop.   Pulmonary:     Effort: Pulmonary effort is normal. No respiratory distress.     Breath sounds: Normal breath sounds. No stridor. No rales.  Abdominal:     General: There is no distension.     Palpations: Abdomen is soft.     Tenderness: There is no abdominal tenderness.  Musculoskeletal:        General: No tenderness.  Skin:    General: Skin is warm and dry.     Findings: No erythema or rash.  Neurological:     Mental Status: She is alert and oriented to person, place, and time.     Comments: LUE with 4/5 strength. Detailed exam by Neuro     ED Results and Treatments Labs (all labs ordered are listed, but only abnormal results are displayed) Labs Reviewed  CBC - Abnormal; Notable for the following components:      Result Value   Hemoglobin 11.9 (*)    HCT 34.8 (*)    Platelets 64 (*)    nRBC 2.8 (*)    All other components within normal limits  HEMOGLOBIN A1C - Abnormal; Notable for the following components:   Hgb A1c MFr Bld 5.8 (*)    All other components within normal limits  COMPREHENSIVE METABOLIC PANEL - Abnormal; Notable for the following components:   Sodium 133 (*)    Potassium 3.4 (*)    Glucose, Bld 110 (*)    Total Protein 6.3 (*)    All other components within normal limits  I-STAT CHEM 8, ED - Abnormal; Notable for the following components:   Glucose, Bld 105 (*)    Calcium, Ion 1.11 (*)    Hemoglobin 11.2 (*)    HCT 33.0 (*)    All other components within normal limits  CBG MONITORING, ED - Abnormal; Notable for the following components:   Glucose-Capillary 108 (*)    All other components within normal limits  SARS CORONAVIRUS 2 (HOSPITAL ORDER, [facility])  MRSA PCR SCREENING  ETHANOL  DIFFERENTIAL    COMPREHENSIVE METABOLIC PANEL  LIPID PANEL  PROTIME-INR  APTT  URINALYSIS, ROUTINE W REFLEX MICROSCOPIC  RAPID URINE DRUG SCREEN, HOSP PERFORMED  CBC  BASIC METABOLIC PANEL                                                                                                                         EKG  EKG Interpretation  Date/Time:    Ventricular Rate:    PR Interval:    QRS Duration:   QT Interval:    QTC Calculation:   R Axis:     Text Interpretation:  Radiology Ct Angio Head W Or Wo Contrast  Result Date: 10/26/2018 CLINICAL DATA:  Left-sided weakness EXAM: CT ANGIOGRAPHY HEAD AND NECK TECHNIQUE: Multidetector CT imaging of the head and neck was performed using the standard protocol during bolus administration of intravenous contrast. Multiplanar CT image reconstructions and MIPs were obtained to evaluate the vascular anatomy. Carotid stenosis measurements (when applicable) are obtained utilizing NASCET criteria, using the distal internal carotid diameter as the denominator. CONTRAST:  59mL OMNIPAQUE IOHEXOL 350 MG/ML SOLN COMPARISON:  Head CT same day FINDINGS: CTA NECK FINDINGS SKELETON: There is no bony spinal canal stenosis. No lytic or blastic lesion. OTHER NECK: Normal pharynx, larynx and major salivary glands. No cervical lymphadenopathy. Unremarkable thyroid gland. UPPER CHEST: No pneumothorax or pleural effusion. No nodules or masses. AORTIC ARCH: There is no calcific atherosclerosis of the aortic arch. There is no aneurysm, dissection or hemodynamically significant stenosis of the visualized ascending aorta and aortic arch. Conventional 3 vessel aortic branching pattern. The visualized proximal subclavian arteries are widely patent. RIGHT CAROTID SYSTEM: --Common carotid artery: Widely patent origin without common carotid artery dissection or aneurysm. --Internal carotid artery: No dissection, occlusion or aneurysm. Mild atherosclerotic calcification at the carotid  bifurcation without hemodynamically significant stenosis. --External carotid artery: No acute abnormality. LEFT CAROTID SYSTEM: --Common carotid artery: Widely patent origin without common carotid artery dissection or aneurysm. --Internal carotid artery: No dissection, occlusion or aneurysm. Mild atherosclerotic calcification at the carotid bifurcation without hemodynamically significant stenosis. --External carotid artery: No acute abnormality. VERTEBRAL ARTERIES: Left dominant configuration. Both origins are clearly patent. No dissection, occlusion or flow-limiting stenosis to the skull base (V1-V3 segments). CTA HEAD FINDINGS POSTERIOR CIRCULATION: --Vertebral arteries: Normal V4 segments. --Posterior inferior cerebellar arteries (PICA): Patent origins from the vertebral arteries. --Anterior inferior cerebellar arteries (AICA): Patent origins from the basilar artery. --Basilar artery: Normal. --Superior cerebellar arteries: Normal. --Posterior cerebral arteries (PCA): Normal. There are bilateral posterior communicating arteries (p-comm) that partially supply the PCAs. ANTERIOR CIRCULATION: --Intracranial internal carotid arteries: Atherosclerotic calcification of the internal carotid arteries at the skull base without hemodynamically significant stenosis. --Anterior cerebral arteries (ACA): Normal. Both A1 segments are present. Patent anterior communicating artery (a-comm). --Middle cerebral arteries (MCA): Normal. VENOUS SINUSES: As permitted by contrast timing, patent. ANATOMIC VARIANTS: None Review of the MIP images confirms the above findings. IMPRESSION: 1. No emergent large vessel occlusion or hemodynamically significant stenosis. 2. Mild bilateral carotid bifurcation atherosclerosis with less than 50% stenosis. 3.  Aortic atherosclerosis (ICD10-I70.0). Electronically Signed   By: Ulyses Jarred M.D.   On: 10/26/2018 02:32   Ct Angio Neck W Or Wo Contrast  Result Date: 10/26/2018 CLINICAL DATA:   Left-sided weakness EXAM: CT ANGIOGRAPHY HEAD AND NECK TECHNIQUE: Multidetector CT imaging of the head and neck was performed using the standard protocol during bolus administration of intravenous contrast. Multiplanar CT image reconstructions and MIPs were obtained to evaluate the vascular anatomy. Carotid stenosis measurements (when applicable) are obtained utilizing NASCET criteria, using the distal internal carotid diameter as the denominator. CONTRAST:  46mL OMNIPAQUE IOHEXOL 350 MG/ML SOLN COMPARISON:  Head CT same day FINDINGS: CTA NECK FINDINGS SKELETON: There is no bony spinal canal stenosis. No lytic or blastic lesion. OTHER NECK: Normal pharynx, larynx and major salivary glands. No cervical lymphadenopathy. Unremarkable thyroid gland. UPPER CHEST: No pneumothorax or pleural effusion. No nodules or masses. AORTIC ARCH: There is no calcific atherosclerosis of the aortic arch. There is no aneurysm, dissection or hemodynamically significant stenosis of the visualized ascending aorta and aortic  arch. Conventional 3 vessel aortic branching pattern. The visualized proximal subclavian arteries are widely patent. RIGHT CAROTID SYSTEM: --Common carotid artery: Widely patent origin without common carotid artery dissection or aneurysm. --Internal carotid artery: No dissection, occlusion or aneurysm. Mild atherosclerotic calcification at the carotid bifurcation without hemodynamically significant stenosis. --External carotid artery: No acute abnormality. LEFT CAROTID SYSTEM: --Common carotid artery: Widely patent origin without common carotid artery dissection or aneurysm. --Internal carotid artery: No dissection, occlusion or aneurysm. Mild atherosclerotic calcification at the carotid bifurcation without hemodynamically significant stenosis. --External carotid artery: No acute abnormality. VERTEBRAL ARTERIES: Left dominant configuration. Both origins are clearly patent. No dissection, occlusion or flow-limiting  stenosis to the skull base (V1-V3 segments). CTA HEAD FINDINGS POSTERIOR CIRCULATION: --Vertebral arteries: Normal V4 segments. --Posterior inferior cerebellar arteries (PICA): Patent origins from the vertebral arteries. --Anterior inferior cerebellar arteries (AICA): Patent origins from the basilar artery. --Basilar artery: Normal. --Superior cerebellar arteries: Normal. --Posterior cerebral arteries (PCA): Normal. There are bilateral posterior communicating arteries (p-comm) that partially supply the PCAs. ANTERIOR CIRCULATION: --Intracranial internal carotid arteries: Atherosclerotic calcification of the internal carotid arteries at the skull base without hemodynamically significant stenosis. --Anterior cerebral arteries (ACA): Normal. Both A1 segments are present. Patent anterior communicating artery (a-comm). --Middle cerebral arteries (MCA): Normal. VENOUS SINUSES: As permitted by contrast timing, patent. ANATOMIC VARIANTS: None Review of the MIP images confirms the above findings. IMPRESSION: 1. No emergent large vessel occlusion or hemodynamically significant stenosis. 2. Mild bilateral carotid bifurcation atherosclerosis with less than 50% stenosis. 3.  Aortic atherosclerosis (ICD10-I70.0). Electronically Signed   By: Ulyses Jarred M.D.   On: 10/26/2018 02:32   Ct Head Code Stroke Wo Contrast  Result Date: 10/26/2018 CLINICAL DATA:  Code stroke.  Left arm paralysis. EXAM: CT HEAD WITHOUT CONTRAST TECHNIQUE: Contiguous axial images were obtained from the base of the skull through the vertex without intravenous contrast. COMPARISON:  Brain MRI 11/17/2014 FINDINGS: Brain: There is no mass, hemorrhage or extra-axial collection. The size and configuration of the ventricles and extra-axial CSF spaces are normal. There is hypoattenuation of the periventricular white matter, most commonly indicating chronic ischemic microangiopathy. Vascular: No abnormal hyperdensity of the major intracranial arteries or dural  venous sinuses. No intracranial atherosclerosis. Skull: The visualized skull base, calvarium and extracranial soft tissues are normal. Sinuses/Orbits: No fluid levels or advanced mucosal thickening of the visualized paranasal sinuses. No mastoid or middle ear effusion. The orbits are normal. ASPECTS [facility] Stroke Program Early CT Score) - Ganglionic level infarction (caudate, lentiform nuclei, internal capsule, insula, M1-M3 cortex): 7 - Supraganglionic infarction (M4-M6 cortex): 3 Total score (0-10 with 10 being normal): 10 IMPRESSION: 1. No acute hemorrhage. 2. ASPECTS is 10. These results were communicated to Dr. Roland Rack at 1:57 am on 10/26/2018 by text page via the [facility] messaging system. Electronically Signed   By: Ulyses Jarred M.D.   On: 10/26/2018 01:57    Pertinent labs & imaging results that were available during my care of the patient were reviewed by me and considered in my medical decision making (see chart for details).  Medications Ordered in ED Medications   stroke: mapping our early stages of recovery book (has no administration in time range)  acetaminophen (TYLENOL) tablet 650 mg (650 mg Oral Given 10/26/18 1121)    Or  acetaminophen (TYLENOL) solution 650 mg ( Per Tube See Alternative 10/26/18 1121)    Or  acetaminophen (TYLENOL) suppository 650 mg ( Rectal See Alternative 10/26/18 1121)  labetalol (NORMODYNE) injection 10 mg (has  no administration in time range)    And  nicardipine (CARDENE) 20mg  in 0.86% saline 237ml IV infusion (0.1 mg/ml) (has no administration in time range)  gabapentin (NEURONTIN) capsule 600 mg (has no administration in time range)  pantoprazole (PROTONIX) EC tablet 40 mg (40 mg Oral Given 10/26/18 1110)  baclofen (LIORESAL) tablet 10 mg (has no administration in time range)  simvastatin (ZOCOR) tablet 20 mg (20 mg Oral Given 10/26/18 1740)  B-complex with vitamin C tablet 1 tablet (1 tablet Oral Given 10/26/18 1110)  0.9 %  sodium chloride  infusion ( Intravenous Restarted 10/26/18 1609)  alteplase (ACTIVASE) 1 mg/mL infusion 61.5 mg (61.5 mg Intravenous New Bag/Given 10/26/18 0156)  iohexol (OMNIPAQUE) 350 MG/ML injection 75 mL (75 mLs Intravenous Contrast Given 10/26/18 0210)  potassium chloride SA (K-DUR) CR tablet 40 mEq (40 mEq Oral Given 10/26/18 1740)                                                                                                                                    Procedures .Critical Care Performed by: Fatima Blank, MD Authorized by: Fatima Blank, MD     CRITICAL CARE Performed by: Grayce Sessions Daneisha Surges Total critical care time: 30 minutes Critical care time was exclusive of separately billable procedures and treating other patients. Critical care was necessary to treat or prevent imminent or life-threatening deterioration. Critical care was time spent personally by me on the following activities: development of treatment plan with patient and/or surrogate as well as nursing, discussions with consultants, evaluation of patient's response to treatment, examination of patient, obtaining history from patient or surrogate, ordering and performing treatments and interventions, ordering and review of laboratory studies, ordering and review of radiographic studies, pulse oximetry and re-evaluation of patient's condition.   (including critical care time)  Medical Decision Making / ED Course I have reviewed the nursing notes for this encounter and the patient's prior records (if available in EHR or on provided paperwork).   Morgan Boyer was evaluated in Emergency Department on 10/26/2018 for the symptoms described in the history of present illness. She was evaluated in the context of the global COVID-19 pandemic, which necessitated consideration that the patient might be at risk for infection with the SARS-CoV-2 virus that causes COVID-19. Institutional protocols and algorithms that pertain to  the evaluation of patients at risk for COVID-19 are in a state of rapid change based on information released by regulatory bodies including the CDC and federal and state organizations. These policies and algorithms were followed during the patient's care in the ED.  Sudden LUE weakness. Code stroke. tPA given. Admitted to NeuroICU.      Final Clinical Impression(s) / ED Diagnoses Final diagnoses:  Left arm weakness      This chart was dictated using voice recognition software.  Despite best efforts to proofread,  errors can occur which can change the documentation meaning.  Fatima Blank, MD 10/26/18 [phone]

## 2018-10-27 ENCOUNTER — Inpatient Hospital Stay (HOSPITAL_COMMUNITY): Payer: PPO

## 2018-10-27 DIAGNOSIS — R54 Age-related physical debility: Secondary | ICD-10-CM | POA: Diagnosis present

## 2018-10-27 DIAGNOSIS — Z823 Family history of stroke: Secondary | ICD-10-CM

## 2018-10-27 LAB — BASIC METABOLIC PANEL
Anion gap: 10 (ref 5–15)
BUN: 11 mg/dL (ref 8–23)
CO2: 21 mmol/L — ABNORMAL LOW (ref 22–32)
Calcium: 8.7 mg/dL — ABNORMAL LOW (ref 8.9–10.3)
Chloride: 104 mmol/L (ref 98–111)
Creatinine, Ser: 0.64 mg/dL (ref 0.44–1.00)
GFR calc Af Amer: >60 mL/min (ref >60)
GFR calc non Af Amer: >60 mL/min (ref >60)
Glucose, Bld: 122 mg/dL — ABNORMAL HIGH (ref 70–99)
Potassium: 3.8 mmol/L (ref 3.5–5.1)
Sodium: 135 mmol/L (ref 135–145)

## 2018-10-27 LAB — CBC
HCT: 34.5 % — ABNORMAL LOW (ref 36.0–46.0)
Hemoglobin: 11.8 g/dL — ABNORMAL LOW (ref 12.0–15.0)
MCH: 28.6 pg (ref 26.0–34.0)
MCHC: 34.2 g/dL (ref 30.0–36.0)
MCV: 83.5 fL (ref 80.0–100.0)
Platelets: 198 10*3/uL (ref 150–400)
RBC: 4.13 MIL/uL (ref 3.87–5.11)
RDW: 13.2 % (ref 11.5–15.5)
WBC: 9.9 10*3/uL (ref 4.0–10.5)
nRBC: 0 % (ref 0.0–0.2)

## 2018-10-27 MED ORDER — HYDROCHLOROTHIAZIDE 12.5 MG PO CAPS: 12.5000 mg | ORAL_CAPSULE | Freq: Every day | ORAL | Status: DC

## 2018-10-27 MED ORDER — ASPIRIN EC 81 MG PO TBEC
81.0000 mg | DELAYED_RELEASE_TABLET | Freq: Every day | ORAL | Status: DC
  Administered 2018-10-27 – 2018-10-31 (×5): 81 mg via ORAL
  Filled 2018-10-27 (×6): qty 1

## 2018-10-27 MED ORDER — CLOPIDOGREL BISULFATE 75 MG PO TABS
75.0000 mg | ORAL_TABLET | Freq: Every day | ORAL | Status: DC
  Administered 2018-10-27 – 2018-10-31 (×5): 75 mg via ORAL
  Filled 2018-10-27 (×6): qty 1

## 2018-10-27 MED ORDER — LORAZEPAM 2 MG/ML IJ SOLN: 1.0000 mg | Freq: Once | INTRAMUSCULAR | Status: DC

## 2018-10-27 MED ORDER — LISINOPRIL 20 MG PO TABS
20.0000 mg | ORAL_TABLET | Freq: Every day | ORAL | Status: DC
  Administered 2018-10-27: 20 mg via ORAL
  Filled 2018-10-27: qty 1

## 2018-10-27 MED ORDER — HYDROCHLOROTHIAZIDE 12.5 MG PO CAPS
12.5000 mg | ORAL_CAPSULE | Freq: Every day | ORAL | Status: DC
  Administered 2018-10-27: 18:00:00 12.5 mg via ORAL
  Filled 2018-10-27: qty 1

## 2018-10-27 MED ORDER — LISINOPRIL-HYDROCHLOROTHIAZIDE 20-12.5 MG PO TABS: 1.0000 | ORAL_TABLET | Freq: Every day | ORAL | Status: DC

## 2018-10-27 NOTE — Evaluation (Signed)
Physical Therapy Evaluation Patient Details Name: Morgan Boyer MRN: [id number] DOB: Aug 17, 1931 Today's Date: 10/27/2018   History of Present Illness  Morgan Boyer is a 85 y.o. female with a history of hypertension, hyperlipidemia, neuropathy who presents with left-sided weakness that started abruptly. MRI revealed R parietal lobe and L occipital lobe emobilic infarcts.  Clinical Impression  Pt admitted with above. Pt was indep and living alone PTA. Pt now presenting with L sided weakness UE >LE. Pt also presenting with impaired processing and sequencing as well as expressive language deficits. Pt oriented x3 and is eager to participate and get better but is also fearful of falling. Pt with minimal L foot clearance during std pvt transfer. Pt to benefit from CIR upon d/c for maximal functional recovery as pt was indep PTA. Spoke with Son, Nicole Kindred, patients younger sister can stay with patient upon d/c from rehab if needed. Acute PT to cont to follow.    Follow Up Recommendations CIR;Supervision/Assistance - 24 hour    Equipment Recommendations  (TBD)    Recommendations for Other Services Rehab consult     Precautions / Restrictions Precautions Precautions: Fall Restrictions Weight Bearing Restrictions: No      Mobility  Bed Mobility Overal bed mobility: Needs Assistance Bed Mobility: Supine to Sit     Supine to sit: Min assist;+2 for physical assistance     General bed mobility comments: max directional verbal cues to sequence task, pt with minimal functional use of L UE to assist with transfer  Transfers Overall transfer level: Needs assistance Equipment used: (2 person lift with gait belt and L knee blocked) Transfers: Sit to/from [facility] Sit to Stand: +2 physical assistance;Min assist Stand pivot transfers: Mod assist;+2 physical assistance       General transfer comment: pt initially very cautious and guarded, pt requiring max directional verbal  cues to sequencing stepping during std pvt transfer, pt with minimal L foot clearance. Pt with fear of falling requiring max verbal cues to calm nerves. Pt completed std pvt to [facility] and then to chair   Ambulation/Gait         Gait velocity: slow   General Gait Details: limited to steps to [facility] and then to chair, modA for L LE advancement due to impaired sequencing  Stairs            Wheelchair Mobility    Modified Rankin (Stroke Patients Only) Modified Rankin (Stroke Patients Only) Pre-Morbid Rankin Score: Slight disability Modified Rankin: Moderately severe disability     Balance Overall balance assessment: Needs assistance Sitting-balance support: Feet supported;Single extremity supported Sitting balance-Leahy Scale: Fair Sitting balance - Comments: min guard for sitting balance   Standing balance support: Bilateral upper extremity supported Standing balance-Leahy Scale: Poor Standing balance comment: pt very scared about falling requiring modA x2 for safe standing                             Pertinent Vitals/Pain Pain Assessment: No/denies pain    Home Living Family/patient expects to be discharged to:: Private residence Living Arrangements: Alone Available Help at Discharge: Family;Available PRN/intermittently Type of Home: House Home Access: Stairs to enter Entrance Stairs-Rails: Right Entrance Stairs-Number of Steps: 2 Home Layout: One level Home Equipment: Walker - 4 wheels;Shower seat;Grab bars - toilet;Grab bars - tub/shower;Hand held shower head      Prior Function Level of Independence: Needs assistance   Gait / Transfers Assistance Needed: uses  rollator in the house  ADL's / Homemaking Assistance Needed: family has been driving and doing grocery shopping since [place]: Right    Extremity/Trunk Assessment   Upper Extremity Assessment Upper Extremity Assessment: Defer to OT evaluation     Lower Extremity Assessment Lower Extremity Assessment: LLE deficits/detail LLE Deficits / Details: grossly 3+/5 LLE Sensation: decreased light touch LLE Coordination: decreased gross motor    Cervical / Trunk Assessment Cervical / Trunk Assessment: Normal  Communication   Communication: Expressive difficulties  Cognition Arousal/Alertness: Awake/alert Behavior During Therapy: WFL for tasks assessed/performed Overall Cognitive Status: Impaired/Different from baseline                                        General Comments General comments (skin integrity, edema, etc.): pt assisted to commode and had BM and urinated, worked with OT on hygiene while PT address balance    Exercises     Assessment/Plan    PT Assessment Patient needs continued PT services  PT Problem List Decreased strength;Decreased range of motion;Decreased activity tolerance;Decreased balance;Decreased mobility;Decreased coordination;Decreased knowledge of use of DME;Decreased cognition;Decreased safety awareness       PT Treatment Interventions DME instruction;Gait training;Stair training;Functional mobility training;Therapeutic activities;Therapeutic exercise;Balance training    PT Goals (Current goals can be found in the Care Plan section)  Acute Rehab PT Goals Patient Stated Goal: get stronger PT Goal Formulation: With patient Time For Goal Achievement: 11/10/18 Potential to Achieve Goals: Good    Frequency Min 4X/week   Barriers to discharge Decreased caregiver support lives alone but has 3 children    Co-evaluation               AM-PAC PT "6 Clicks" Mobility  Outcome Measure Help needed turning from your back to your side while in a flat bed without using bedrails?: A Little Help needed moving from lying on your back to sitting on the side of a flat bed without using bedrails?: A Little Help needed moving to and from a bed to a chair (including a wheelchair)?: A  Lot Help needed standing up from a chair using your arms (e.g., wheelchair or bedside chair)?: A Lot Help needed to walk in hospital room?: A Lot Help needed climbing 3-5 steps with a railing? : Total 6 Click Score: 13    End of Session Equipment Utilized During Treatment: Gait belt Activity Tolerance: Patient tolerated treatment well Patient left: in chair;with call bell/phone within reach;with chair alarm set;with nursing/sitter in room Nurse Communication: Mobility status PT Visit Diagnosis: Unsteadiness on feet (R26.81)    Time: 4818-5631 PT Time Calculation (min) (ACUTE ONLY): 39 min   Charges:   PT Evaluation $PT Eval Moderate Complexity: 1 Mod PT Treatments $Therapeutic Activity: 8-22 mins        Kittie Plater, PT, DPT Acute Rehabilitation Services Pager #: [phone] Office #: [phone]   Berline Lopes 10/27/2018, 11:32 AM

## 2018-10-27 NOTE — Progress Notes (Signed)
Rehab Admissions Coordinator Note:  Patient was screened by Cleatrice Burke for appropriateness for an Inpatient Acute Rehab Consult per PT recommendation. At this time, we are recommending Inpatient Rehab consult.  Cleatrice Burke RN MSN 10/27/2018, 11:40 AM  I can be reached at [phone].

## 2018-10-27 NOTE — Progress Notes (Signed)
Patient transferred from ICU to 3 [facility] room 12. Patient alert oriented times 4, c/o motion sickness, BP is elevated, MD notified.Patient in bed locked at lower position, call light, telephone and bedside table within reach. Will continue to monitor patient. Report received from [place], [place].

## 2018-10-27 NOTE — Evaluation (Addendum)
Occupational Therapy Evaluation Patient Details Name: Morgan Boyer MRN: [id number] DOB: November 07, 1931 Today's Date: 10/27/2018    History of Present Illness Morgan Boyer is a 85 y.o. female with a history of hypertension, hyperlipidemia, neuropathy who presents with left-sided weakness that started abruptly. MRI revealed R parietal lobe and L occipital lobe emobilic infarcts.   Clinical Impression   This 85 y/o female presents with the above. PTA pt reports mod independence with ADL and mobility using rollator, was recently receiving assist for some iADL tasks. Pt presenting with impaired cognition, L side weakness, decreased standing balance impacting her functional performance. She currently requires two person modA for functional transfers, min-modA for seated UB ADL and maxA(+2) for LB ADL. Pt requires increased time to process and follow simple commands. She will benefit from continued acute OT services and recommend follow up therapy services in CIR setting after discharge to maximize her safety and independence with ADL and mobility. Will follow.     Follow Up Recommendations  CIR;Supervision/Assistance - 24 hour    Equipment Recommendations  Other (comment)(TBD in next venue )    Recommendations for Other Services Rehab consult     Precautions / Restrictions Precautions Precautions: Fall Restrictions Weight Bearing Restrictions: No      Mobility Bed Mobility Overal bed mobility: Needs Assistance Bed Mobility: Supine to Sit     Supine to sit: Min assist;+2 for physical assistance     General bed mobility comments: max directional verbal cues to sequence task, pt with minimal functional use of L UE to assist with transfer  Transfers Overall transfer level: Needs assistance Equipment used: (2 person lift with gait belt and L knee blocked) Transfers: Sit to/from [facility] Sit to Stand: +2 physical assistance;Min assist Stand pivot transfers: Mod  assist;+2 physical assistance       General transfer comment: pt initially very cautious and guarded, pt requiring max directional verbal cues to sequencing stepping during std pvt transfer, pt with minimal L foot clearance. Pt with fear of falling requiring max verbal cues to calm nerves. Pt completed std pvt to [facility] and then to chair     Balance Overall balance assessment: Needs assistance Sitting-balance support: Feet supported;Single extremity supported Sitting balance-Leahy Scale: Fair Sitting balance - Comments: min guard for sitting balance   Standing balance support: Bilateral upper extremity supported Standing balance-Leahy Scale: Poor Standing balance comment: pt very scared about falling requiring modA x2 for safe standing                           ADL either performed or assessed with clinical judgement   ADL Overall ADL's : Needs assistance/impaired Eating/Feeding: Minimal assistance;Sitting   Grooming: Set up;Minimal assistance;Sitting;Wash/dry face;Wash/dry hands   Upper Body Bathing: Minimal assistance;Sitting   Lower Body Bathing: Maximal assistance;+2 for physical assistance;+2 for safety/equipment;Sit to/from stand   Upper Body Dressing : Moderate assistance;Sitting   Lower Body Dressing: Maximal assistance;+2 for physical assistance;+2 for safety/equipment;Sit to/from stand Lower Body Dressing Details (indicate cue type and reason): pt requires assist to don socks, once started over foot pt able to assist with donning sock over heel/ankle, but requires increased time/effort as pt only using RUE to complete task Toilet Transfer: Moderate assistance;+2 for physical assistance;+2 for safety/equipment;Stand-pivot;BSC Toilet Transfer Details (indicate cue type and reason): use of +2 HHA Toileting- Clothing Manipulation and Hygiene: Moderate assistance;+2 for physical assistance;+2 for safety/equipment;Sit to/from stand Toileting - Water quality scientist  Details (indicate  cue type and reason): +2 assist for standing balance; pt able to perform peri-care both anteriorly and posterior, assist provided to ensure thoroughness with posterior hygiene after BM     Functional mobility during ADLs: Moderate assistance;+2 for physical assistance;+2 for safety/equipment(HHA; stand pivot transfer) General ADL Comments: pt presenting with cognitive impairments, L side weakness, decreased balance      Vision   Additional Comments: to be further assessed     Perception     Praxis      Pertinent Vitals/Pain Pain Assessment: No/denies pain     Hand Dominance Right   Extremity/Trunk Assessment Upper Extremity Assessment Upper Extremity Assessment: LUE deficits/detail LUE Deficits / Details: grossly 3-/5; pt appears to have more proximal strength vs distal, minimal grip ROM/strength, L hand also bandaged due to recent fall last PM and skin tear which limits pt's ROM, question decreased sensation  LUE Sensation: decreased proprioception LUE Coordination: decreased fine motor;decreased gross motor   Lower Extremity Assessment Lower Extremity Assessment: Defer to PT evaluation   Cervical / Trunk Assessment Cervical / Trunk Assessment: Normal   Communication Communication Communication: Expressive difficulties   Cognition Arousal/Alertness: Awake/alert Behavior During Therapy: WFL for tasks assessed/performed Overall Cognitive Status: Impaired/Different from baseline Area of Impairment: Attention;Memory;Following commands;Safety/judgement;Awareness;Problem solving                   Current Attention Level: Sustained Memory: Decreased short-term memory Following Commands: Follows one step commands with increased time Safety/Judgement: Decreased awareness of deficits Awareness: Intellectual Problem Solving: Difficulty sequencing;Decreased initiation;Slow processing;Requires verbal cues;Requires tactile cues General Comments: pt with  delayed precessing requiring increased time and occasionally repetition of commands; pt A&Ox4; difficulty sequencing mobility tasks   General Comments       Exercises     Shoulder Instructions      Home Living Family/patient expects to be discharged to:: Private residence Living Arrangements: Alone Available Help at Discharge: Family;Available PRN/intermittently Type of Home: House Home Access: Stairs to enter [employer] of Steps: 2 Entrance Stairs-Rails: Right Home Layout: One level     Bathroom Shower/Tub: Teacher, early years/pre: Handicapped height     Home Equipment: Environmental consultant - 4 wheels;Shower seat;Grab bars - toilet;Grab bars - tub/shower;Hand held shower head          Prior Functioning/Environment Level of Independence: Needs assistance  Gait / Transfers Assistance Needed: uses rollator in the house ADL's / Homemaking Assistance Needed: family has been driving and doing grocery shopping since COVID            OT Problem List: Decreased strength;Decreased range of motion;Decreased activity tolerance;Impaired balance (sitting and/or standing);Decreased coordination;Decreased cognition;Decreased safety awareness;Decreased knowledge of use of DME or AE;Decreased knowledge of precautions;Impaired UE functional use      OT Treatment/Interventions: Self-care/ADL training;Therapeutic exercise;Neuromuscular education;DME and/or AE instruction;Therapeutic activities;Patient/family education;Balance training;Visual/perceptual remediation/compensation;Cognitive remediation/compensation    OT Goals(Current goals can be found in the care plan section) Acute Rehab OT Goals Patient Stated Goal: get stronger OT Goal Formulation: With patient Time For Goal Achievement: 11/10/18 Potential to Achieve Goals: Good  OT Frequency: Min 2X/week   Barriers to D/C:            Co-evaluation PT/OT/SLP Co-Evaluation/Treatment: Yes Reason for Co-Treatment: For  patient/therapist safety;Complexity of the patient's impairments (multi-system involvement);Necessary to address cognition/behavior during functional activity   OT goals addressed during session: ADL's and self-care      AM-PAC OT "6 Clicks" Daily Activity     Outcome Measure Help from  another person eating meals?: A Little Help from another person taking care of personal grooming?: A Lot Help from another person toileting, which includes using toliet, bedpan, or urinal?: A Lot Help from another person bathing (including washing, rinsing, drying)?: A Lot Help from another person to put on and taking off regular upper body clothing?: A Lot Help from another person to put on and taking off regular lower body clothing?: A Lot 6 Click Score: 13   End of Session Equipment Utilized During Treatment: Gait belt Nurse Communication: Mobility status  Activity Tolerance: Patient tolerated treatment well Patient left: in chair;with call bell/phone within reach;with chair alarm set  OT Visit Diagnosis: Other abnormalities of gait and mobility (R26.89);Hemiplegia and hemiparesis Hemiplegia - Right/Left: Left Hemiplegia - dominant/non-dominant: Non-Dominant Hemiplegia - caused by: Cerebral infarction                Time: 2130-8657 OT Time Calculation (min): 40 min Charges:  OT General Charges $OT Visit: 1 Visit OT Evaluation $OT Eval Moderate Complexity: 1 Mod  Lou Cal, OT [employer] Pager [phone] Office [phone]   Raymondo Band 10/27/2018, 1:49 PM

## 2018-10-27 NOTE — Evaluation (Addendum)
Speech Language Pathology Evaluation Patient Details Name: Morgan Boyer MRN: [id number] DOB: [birth year] Today's Date: 10/27/2018 Time: 9628-3662 SLP Time Calculation (min) (ACUTE ONLY): 16 min  Problem List:  Patient Active Problem List   Diagnosis Date Noted  . Family hx-stroke 10/27/2018  . Advanced age 85/27/2020  . 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 Thrombocytopenia ([place])   . Essential hypertension   . Hyperlipidemia   . Hypokalemia   . Lumbar stenosis with neurogenic claudication 07/03/2017  . Near syncope 11/10/2014  . Nocturnal leg cramps 07/15/2014  . Abnormality of gait 07/15/2014  . Polyneuropathy in other diseases classified elsewhere ([place]) 01/14/2013  . Memory changes 01/14/2013   Past Medical History:  Past Medical History:  Diagnosis Date  . Abnormality of gait   . Adenomatous colon polyp   . Arthritis   . Back pain   . Cancer ([place])    skin cancers  . Depression   . Diverticulosis   . DJD (degenerative joint disease) of knee   . Dyslipidemia   . Esophageal reflux   . H/O hiatal hernia   . Heart murmur   . Herpes zoster    throat  . History of shingles    left throat 07/2007  . Hyperlipidemia   . Hypertension   . Memory changes 01/14/2013  . Neuromuscular disorder ([place])    peripheral neuropathy  . Nocturnal leg cramps 07/15/2014  . Peripheral neuropathy   . Polyneuropathy in other diseases classified elsewhere ([place]) 01/14/2013   Past Surgical History:  Past Surgical History:  Procedure Laterality Date  . ABDOMINAL HYSTERECTOMY    . APPENDECTOMY    . BACK SURGERY  07/2017  . COLON SURGERY     2001 diverticulitis with infection ..drained surgically .  Marland Kitchen EYE SURGERY     cat ext bil   . JOINT REPLACEMENT     right  . KNEE ARTHROSCOPY  11/08/2011  . TONSILLECTOMY    . TOTAL KNEE ARTHROPLASTY  11/07/2011   Procedure: TOTAL KNEE ARTHROPLASTY;  Surgeon: Ninetta Lights, MD;  Location: [place] [facility];   Service: Orthopedics;  Laterality: Left;  . TUBAL LIGATION    . WRIST SURGERY     for skin cancer   HPI:  [name] is a 85 y.o. female with a history of hypertension, hyperlipidemia, neuropathy who presents with left-sided weakness that started abruptly. MRI revealed R parietal lobe and L occipital lobe emobilic infarcts.   Assessment / Plan / Recommendation Clinical Impression  Pt exhibits mildly impaired cognition primarily in the areas of awareness, problem solving and executive functioning. She becomes overwhelmed when complexity of task increases, demonstrating emergent awareness stating " I think my brain is not ready for this." Upon arrival she stated "I was thinking of getting up to get my walker before you came". Oriented x 4 and recalled she was in the hospital but thought her personal walker was present. Noted mild left neglect during paper/pen tasks. ST will continue to perform diagnostic treatment in the area of memory and facilitate cognition to support safest level of independence.       SLP Assessment  SLP Recommendation/Assessment: Patient needs continued Speech Lanaguage Pathology Services SLP Visit Diagnosis: Cognitive communication deficit (R41.841)    Follow Up Recommendations  Inpatient Rehab    Frequency and Duration min 2x/week  2 weeks      SLP Evaluation Cognition  Overall Cognitive Status: Impaired/Different from baseline Arousal/Alertness: Awake/alert Orientation Level:  Oriented X4 Attention: Sustained Sustained Attention: Appears intact Memory: (cont to assess next session) Awareness: Impaired Awareness Impairment: Anticipatory impairment Problem Solving: Impaired Safety/Judgment: (?)       Comprehension  Auditory Comprehension Overall Auditory Comprehension: Appears within functional limits for tasks assessed Yes/No Questions: Not tested Commands: (functional for basic) Visual Recognition/Discrimination Discrimination: Not  tested Reading Comprehension Reading Status: (TBA)    Expression Expression Primary Mode of Expression: Verbal Verbal Expression Overall Verbal Expression: Appears within functional limits for tasks assessed Initiation: No impairment Level of Generative/Spontaneous Verbalization: Conversation Repetition: (NT) Naming: Not tested Pragmatics: No impairment Written Expression Dominant Hand: Right Written Expression: (TBA)   Oral / Motor  Motor Speech Overall Motor Speech: Appears within functional limits for tasks assessed Respiration: Within functional limits Phonation: Normal Resonance: Within functional limits Articulation: Within functional limitis Intelligibility: Intelligible Motor Planning: Witnin functional limits   GO                    Houston Siren 10/27/2018, 4:52 PM Orbie Pyo Robertta Halfhill M.Ed Risk analyst [phone] Office [phone]

## 2018-10-27 NOTE — Progress Notes (Addendum)
STROKE TEAM PROGRESS NOTE   SUBJECTIVE (INTERVAL HISTORY) Her RN and PT/OT are at bedside.  Pt still has left UE weakness. She had sundowning last night and was put on Posey belt. This am she is AAO x3, cooperative. Labs pending. MRI showed cardioembolic pattern.   OBJECTIVE Vitals:   10/27/18 0500 10/27/18 0600 10/27/18 0800 10/27/18 0900  BP: (!) 174/102 (!) 171/93 (!) 174/100 (!) 165/95  Pulse: 80 80 76 75  Resp: 20 16 18 16   Temp:   98 F (36.7 C)   TempSrc:   Oral   SpO2: 99% 99% 97% 99%  Weight:      Height:        CBC:  Recent Labs  Lab 10/26/18 0146 10/26/18 0147  WBC 4.0  --   NEUTROABS 2.1  --   HGB 11.9* 11.2*  HCT 34.8* 33.0*  MCV 88.1  --   PLT 64*  --     Basic Metabolic Panel:  Recent Labs  Lab 10/26/18 0146 10/26/18 0147 10/26/18 0238  NA SPECIMEN HEMOLYZED. HEMOLYSIS MAY AFFECT INTEGRITY OF RESULTS. [address] [place] HEMOLYSIS MAY AFFECT INTEGRITY OF RESULTS. 3.7 3.4*  CL SPECIMEN HEMOLYZED. HEMOLYSIS MAY AFFECT INTEGRITY OF RESULTS. 99 99  CO2 SPECIMEN HEMOLYZED. HEMOLYSIS MAY AFFECT INTEGRITY OF RESULTS.  --  23  GLUCOSE SPECIMEN HEMOLYZED. HEMOLYSIS MAY AFFECT INTEGRITY OF RESULTS. 105* 110*  BUN SPECIMEN HEMOLYZED. HEMOLYSIS MAY AFFECT INTEGRITY OF RESULTS. 15 12  CREATININE SPECIMEN HEMOLYZED. HEMOLYSIS MAY AFFECT INTEGRITY OF RESULTS. 0.80 0.77  CALCIUM SPECIMEN HEMOLYZED. HEMOLYSIS MAY AFFECT INTEGRITY OF RESULTS.  --  8.9    Lipid Panel:     Component Value Date/Time   CHOL 149 10/26/2018 0541   TRIG 52 10/26/2018 0541   HDL 55 10/26/2018 0541   CHOLHDL 2.7 10/26/2018 0541   VLDL 10 10/26/2018 0541   LDLCALC 84 10/26/2018 0541   HgbA1c:  Lab Results  Component Value Date   HGBA1C 5.8 (H) 10/26/2018   Urine Drug Screen: No results found for: LABOPIA, COCAINSCRNUR, LABBENZ, AMPHETMU, THCU, LABBARB  Alcohol Level     Component Value Date/Time   ETH <10 10/26/2018 0146    IMAGING  Ct Angio Head W Or Wo  Contrast Ct Angio Neck W Or Wo Contrast 10/26/2018 IMPRESSION:  1. No emergent large vessel occlusion or hemodynamically significant stenosis.  2. Mild bilateral carotid bifurcation atherosclerosis with less than 50% stenosis.  3. Aortic atherosclerosis (ICD10-I70.0).  Ct Head Code Stroke Wo Contrast 10/26/2018 IMPRESSION:  1. No acute hemorrhage.  2. ASPECTS is 10.   Mr Brain Wo Contrast  Result Date: 10/27/2018 CLINICAL DATA:  Stroke follow-up.  Left upper extremity weakness. EXAM: MRI HEAD WITHOUT CONTRAST TECHNIQUE: Multiplanar, multiecho pulse sequences of the brain and surrounding structures were obtained without intravenous contrast. COMPARISON:  CT head and CTA head neck 10/26/2018 FINDINGS: Brain: Diffusion-weighted images demonstrate scattered cortical infarcts involving the posterior right frontal lobe and parietal lobe near the vertex. There is no significant hemorrhage associated. T2 signal changes are associated with the areas of restricted diffusion. Additional cortical infarcts are present in the posterior parietal lobes bilaterally and left occipital lobe. T2 hyperintensity is present in these regions as well. Mild confluent periventricular white matter changes are present bilaterally. Mild atrophy is within normal limits for age. The ventricles are proportionate to the degree of atrophy. The internal auditory canals are within normal limits. The brainstem and cerebellum are within normal limits. Vascular: Flow is present in  the major intracranial arteries. Skull and upper cervical spine: Grade 1 degenerative anterolisthesis is present at C3-4, stable. Craniocervical junction is normal. Midline structures are unremarkable. Sinuses/Orbits: Mild mucosal thickening is present in the anterior ethmoid air cells bilaterally and inferior right maxillary sinus. The paranasal sinuses and mastoid air cells are otherwise clear. Bilateral lens replacements are noted. Globes and orbits are  otherwise unremarkable. IMPRESSION: 1. Acute/subacute nonhemorrhagic scattered cortical infarcts involving the posterior right frontal lobe consistent with the patient's left upper extremity weakness. 2. Acute/subacute scattered nonhemorrhagic cortical infarcts are present in the high right parietal lobe. 3. Acute/subacute nonhemorrhagic cortical infarcts in the more inferior posterior parietal lobes and left occipital lobe. 4. Scattered distribution suggests a central embolic source. 5. Mild sinus disease. 6. Grade 1 anterolisthesis at C3-4. Electronically Signed   By: San Morelle M.D.   On: 10/27/2018 02:01    PHYSICAL EXAM  Temp:  [97.9 F (36.6 C)-98.6 F (37 C)] 98 F (36.7 C) (07/27 0800) Pulse Rate:  [71-124] 75 (07/27 0900) Resp:  [14-27] 16 (07/27 0900) BP: (149-181)/(83-103) 165/95 (07/27 0900) SpO2:  [95 %-100 %] 99 % (07/27 0900)  General - Well nourished, well developed, in no apparent distress.  Ophthalmologic - fundi not visualized due to noncooperation.  Cardiovascular - Regular rate and rhythm.  Mental Status -  Level of arousal and orientation to time, place, and person were intact. Language including expression, naming, repetition, comprehension was assessed and found intact. Fund of Knowledge was assessed and was intact.  Cranial Nerves II - XII - II - visual field is full and intact. III, IV, VI - Extraocular movements intact. V - Facial sensation intact bilaterally. VII - Facial movement intact bilaterally. VIII - Hearing & vestibular intact bilaterally. X - Palate elevates symmetrically. XI - Chin turning & shoulder shrug intact bilaterally. XII - Tongue protrusion intact.  Motor Strength - The patient's strength was normal in all extremities except LUE 3/5 proximal and 2/5 finger movement.  Bulk was normal and fasciculations were absent.   Motor Tone - Muscle tone was assessed at the neck and appendages and was normal.  Reflexes - The patient's  reflexes were symmetrical in all extremities and she had no pathological reflexes.  Sensory - Light touch, temperature/pinprick were assessed and were symmetrical.    Coordination - The patient had normal movements in the right hand with no ataxia or dysmetria.  Tremor was absent.  Gait and Station - deferred.   ASSESSMENT/PLAN [name] is a 85 y.o. female with history of hypertension, hyperlipidemia, neuropathy who presents with left-sided weakness that started abruptly.  She received IV t-PA on Sunday 10/26/2018 at 0300.  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  CTA H&N - Mild bilateral carotid bifurcation atherosclerosis with less than 50% stenosis.   MRI right MCA patchy infarct and left posterior MCA infarct  2D Echo - pending  [employer] [employer] Virus 2  - negative  LDL - 84    HgbA1c - 5.8  UDS - pending  VTE prophylaxis - SCDs  Diet - regular  aspirin 81 mg daily prior to admission, now on No antithrombotic due to severe thrombocytopenia   Patient counseled to be compliant with her antithrombotic medications  Ongoing aggressive stroke risk factor management  Therapy recommendations:  CIR  Disposition:  Pending  Hypertension  Blood  pressure on the high end  Resume home meds with lisinopril and HCTZ . Gradually normalize BP in 3-5 days . Long-term BP goal normotensive  Hyperlipidemia  Lipid lowering medication PTA:  Zocor 20 mg daily  LDL 84, goal < 70  Current lipid lowering medication:  Zocor 20 mg daily  Continue statin at discharge  Thrombocytopenia   Platelet 64-> pending, baseline 150-200  Close monitoring  ASA 81 at home  Hold on antiplatelet for now  Other Stroke Risk Factors  Advanced age  Family hx stroke (father)  Other Active Problems  Hypokalemia - 3.4 (hemolyzed - probably lower) -  supplemented   Hospital day # 1  This patient is critically ill due to stroke s/p tPA, thrombocytopenia, HTN and at significant risk of neurological worsening, death form recurrent stroke, hemorrhagic conversion, bleeding. This patient's care requires constant monitoring of vital signs, hemodynamics, respiratory and cardiac monitoring, review of multiple databases, neurological assessment, discussion with family, other specialists and medical decision making of high complexity. I spent 30 minutes of neurocritical care time in the care of this patient. I had long discussion with Son Nicole Kindred over the phone, updated pt current condition, treatment plan and potential prognosis. He expressed understanding and appreciation.    Rosalin Hawking, MD PhD Stroke Neurology 10/27/2018 9:55 AM  To contact Stroke Continuity provider, please refer to [email or web address] After hours, contact General Neurology

## 2018-10-28 ENCOUNTER — Encounter (HOSPITAL_COMMUNITY)
Admission: EM | Disposition: A | Discharge status: Rehabilitation Facility | Payer: Self-pay | Source: Home / Self Care | Attending: Neurology

## 2018-10-28 DIAGNOSIS — I513 Intracardiac thrombosis, not elsewhere classified: Secondary | ICD-10-CM

## 2018-10-28 LAB — BASIC METABOLIC PANEL
Anion gap: 9 (ref 5–15)
BUN: 11 mg/dL (ref 8–23)
CO2: 24 mmol/L (ref 22–32)
Calcium: 8.7 mg/dL — ABNORMAL LOW (ref 8.9–10.3)
Chloride: 99 mmol/L (ref 98–111)
Creatinine, Ser: 0.66 mg/dL (ref 0.44–1.00)
GFR calc Af Amer: >60 mL/min (ref >60)
GFR calc non Af Amer: >60 mL/min (ref >60)
Glucose, Bld: 107 mg/dL — ABNORMAL HIGH (ref 70–99)
Potassium: 3.4 mmol/L — ABNORMAL LOW (ref 3.5–5.1)
Sodium: 132 mmol/L — ABNORMAL LOW (ref 135–145)

## 2018-10-28 LAB — URINALYSIS, ROUTINE W REFLEX MICROSCOPIC
Bilirubin Urine: NEGATIVE
Glucose, UA: NEGATIVE mg/dL
Hgb urine dipstick: NEGATIVE
Ketones, ur: 20 mg/dL — AB
Nitrite: NEGATIVE
Protein, ur: NEGATIVE mg/dL
Specific Gravity, Urine: 1.014 (ref 1.005–1.030)
pH: 6 (ref 5.0–8.0)

## 2018-10-28 LAB — RAPID URINE DRUG SCREEN, HOSP PERFORMED
Amphetamines: NOT DETECTED
Barbiturates: NOT DETECTED
Benzodiazepines: NOT DETECTED
Cocaine: NOT DETECTED
Opiates: NOT DETECTED
Tetrahydrocannabinol: NOT DETECTED

## 2018-10-28 LAB — CBC
HCT: 34.3 % — ABNORMAL LOW (ref 36.0–46.0)
Hemoglobin: 11.7 g/dL — ABNORMAL LOW (ref 12.0–15.0)
MCH: 28.6 pg (ref 26.0–34.0)
MCHC: 34.1 g/dL (ref 30.0–36.0)
MCV: 83.9 fL (ref 80.0–100.0)
Platelets: 180 10*3/uL (ref 150–400)
RBC: 4.09 MIL/uL (ref 3.87–5.11)
RDW: 13.2 % (ref 11.5–15.5)
WBC: 10.1 10*3/uL (ref 4.0–10.5)
nRBC: 0 % (ref 0.0–0.2)

## 2018-10-28 LAB — ECHOCARDIOGRAM COMPLETE
Height: 62 in
Weight: 2398.6 oz

## 2018-10-28 SURGERY — LOOP RECORDER INSERTION

## 2018-10-28 MED ORDER — LISINOPRIL 20 MG PO TABS
20.0000 mg | ORAL_TABLET | Freq: Two times a day (BID) | ORAL | Status: DC
  Administered 2018-10-28 – 2018-10-30 (×6): 20 mg via ORAL
  Filled 2018-10-28 (×7): qty 1

## 2018-10-28 MED ORDER — AMLODIPINE BESYLATE 10 MG PO TABS
10.0000 mg | ORAL_TABLET | Freq: Every day | ORAL | Status: DC
  Administered 2018-10-28 – 2018-10-31 (×4): 10 mg via ORAL
  Filled 2018-10-28 (×5): qty 1

## 2018-10-28 MED ORDER — POTASSIUM CHLORIDE CRYS ER 20 MEQ PO TBCR
40.0000 meq | EXTENDED_RELEASE_TABLET | ORAL | Status: AC
  Administered 2018-10-28 (×2): 40 meq via ORAL
  Filled 2018-10-28 (×2): qty 2

## 2018-10-28 NOTE — Progress Notes (Signed)
STROKE TEAM PROGRESS NOTE   SUBJECTIVE (INTERVAL HISTORY) Pt sitting in chair and asking to put back to bed. She has no other complains. TTE concerning for LAA mobile density vs. Thrombus, will need TEE for further evaluation.   OBJECTIVE Vitals:   10/27/18 2110 10/27/18 2341 10/28/18 0325 10/28/18 1207  BP: (!) 171/89 (!) 172/87 (!) 168/94 (!) 158/91  Pulse: 75 73 79 68  Resp: _0 Temp: 99 F (37.2 C) 99 F (37.2 C) 97.9 F (36.6 C) 98 F (36.7 C)  TempSrc: Oral Oral Oral Oral  SpO2: 98% 98% 96% 97%  Weight:      Height:        CBC:  Recent Labs  Lab 10/26/18 0146  10/27/18 1058 10/28/18 0407  WBC 4.0  --  9.9 10.1  NEUTROABS 2.1  --   --   --   HGB 11.9*   < > 11.8* 11.7*  HCT 34.8*   < > 34.5* 34.3*  MCV 88.1  --  83.5 83.9  PLT 64*  --  198 180   < > = values in this interval not displayed.    Basic Metabolic Panel:  Recent Labs  Lab 10/27/18 1058 10/28/18 0407  NA 135 132*  K 3.8 3.4*  CL 104 99  CO2 21* 24  GLUCOSE 122* 107*  BUN 11 11  CREATININE 0.64 0.66  CALCIUM 8.7* 8.7*    Lipid Panel:     Component Value Date/Time   CHOL 149 10/26/2018 0541   TRIG 52 10/26/2018 0541   HDL 55 10/26/2018 0541   CHOLHDL 2.7 10/26/2018 0541   VLDL 10 10/26/2018 0541   LDLCALC 84 10/26/2018 0541   HgbA1c:  Lab Results  Component Value Date   HGBA1C 5.8 (H) 10/26/2018   Urine Drug Screen: No results found for: LABOPIA, COCAINSCRNUR, LABBENZ, AMPHETMU, THCU, LABBARB  Alcohol Level     Component Value Date/Time   ETH <10 10/26/2018 0146    IMAGING  Ct Head Code Stroke Wo Contrast 10/26/2018 1. No acute hemorrhage.  2. ASPECTS is 10.   Ct Angio Head W Or Wo Contrast Ct Angio Neck W Or Wo Contrast 10/26/2018 1. No emergent large vessel occlusion or hemodynamically significant stenosis.  2. Mild bilateral carotid bifurcation atherosclerosis with less than 50% stenosis.  3. Aortic atherosclerosis (ICD10-I70.0). Mr Brain Wo  Contrast 10/27/2018 1. Acute/subacute nonhemorrhagic scattered cortical infarcts involving the posterior right frontal lobe consistent with the patient's left upper extremity weakness. 2. Acute/subacute scattered nonhemorrhagic cortical infarcts are present in the high right parietal lobe. 3. Acute/subacute nonhemorrhagic cortical infarcts in the more inferior posterior parietal lobes and left occipital lobe. 4. Scattered distribution suggests a central embolic source. 5. Mild sinus disease. 6. Grade 1 anterolisthesis at C3-4.   2D Echocardiogram 10/27/2018 LVEF 60-65%, moderate LVH, normal wall motion, grade 2 DD, elevated LV filling pressure, MAC with moderate MR, moderate LAE, moderate TR, RVSP 72 mmHg (moderate to severe pulmonary hypertension), IVC non-dilated but does not collapse, there is an atrial septal aneurysm (cannot exclude PFO) - although not well-visualized, there appears to be a mobile density in the left atrium in the area of the appendage that may be thrombus in the left atrial appendage (seen in the parasternal long-axis view). Recommend TEE in the setting of stroke to better visualize LAA and interatrial septum. I was asked to read this study on 7/28 as it was discovered this morning to be unread over the weekend (  performed on 7/26).  TEE pending   PHYSICAL EXAM Temp:  [97.7 F (36.5 C)-99 F (37.2 C)] 98 F (36.7 C) (07/28 1207) Pulse Rate:  [67-79] 68 (07/28 1207) Resp:  [16-18] 17 (07/28 1207) BP: (132-183)/(87-102) 158/91 (07/28 1207) SpO2:  [96 %-100 %] 97 % (07/28 1207)  General - Well nourished, well developed, in no apparent distress.  Ophthalmologic - fundi not visualized due to noncooperation.  Cardiovascular - Regular rate and rhythm.  Mental Status -  Level of arousal and orientation to time, place, and person were intact. Language including expression, naming, repetition, comprehension was assessed and found intact. Fund of Knowledge was  assessed and was intact.  Cranial Nerves II - XII - II - visual field is full and intact. III, IV, VI - Extraocular movements intact. V - Facial sensation intact bilaterally. VII - Facial movement intact bilaterally. VIII - Hearing & vestibular intact bilaterally. X - Palate elevates symmetrically. XI - Chin turning & shoulder shrug intact bilaterally. XII - Tongue protrusion intact.  Motor Strength - The patient's strength was normal in all extremities except LUE 3/5 proximal and 2/5 finger movement.  Bulk was normal and fasciculations were absent.   Motor Tone - Muscle tone was assessed at the neck and appendages and was normal.  Reflexes - The patient's reflexes were symmetrical in all extremities and she had no pathological reflexes.  Sensory - Light touch, temperature/pinprick were assessed and were symmetrical.    Coordination - The patient had normal movements in the right hand with no ataxia or dysmetria.  Tremor was absent.  Gait and Station - deferred.   ASSESSMENT/PLAN Ms. AHAVA KISSOON is a 85 y.o. female with history of hypertension, hyperlipidemia, neuropathy who presents with left-sided weakness that started abruptly. She received IV t-PA on Sunday 10/26/2018 at 0300.  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  CTA H&N - Mild bilateral carotid bifurcation atherosclerosis with less than 50% stenosis.   MRI right MCA patchy infarct and left posterior MCA infarct  2D Echo - EF 60-65%. Concern for LAA thrombus, cannot rule out PFO.  TEE pending   If TEE neg, place loop to look for AF as source of stroke  [employer] Virus 2  - negative  LDL - 84    HgbA1c - 5.8  VTE prophylaxis - SCDs  aspirin 81 mg daily prior to admission, now on DAPT with ASA 81 and plavix 75  Therapy recommendations:  CIR  Disposition:   Pending  Hypertension  Blood pressure on the high end  Resume home meds with lisinopril and HCTZ . Gradually normalize BP in 3-5 days . Long-term BP goal normotensive  Hyperlipidemia  Lipid lowering medication PTA:  Zocor 20 mg daily  LDL 84, goal < 70  Current lipid lowering medication:  Zocor 20 mg daily  Continue statin at discharge  Thrombocytopenia   Platelet 64-> 198-180, baseline 150-200  Continue to monitor  ASA 81 at home  On DAPT now  Other Stroke Risk Factors  Advanced age  Family hx stroke (father)  Other Active Problems  Hypokalemia - 3.8->3.4 - supplement   Hospital day # 2   Rosalin Hawking, MD PhD Stroke Neurology 10/28/2018 3:06 PM  To contact Stroke Continuity provider, please refer to [email or web address] After hours, contact General Neurology

## 2018-10-28 NOTE — Progress Notes (Signed)
    [facility] has been requested to perform a transesophageal echocardiogram on 10/30/2018 for stroke evaluation.  After careful review of history and examination, the risks and benefits of transesophageal echocardiogram have been explained including risks of esophageal damage, perforation (1:10,000 risk), bleeding, pharyngeal hematoma as well as other potential complications associated with conscious sedation including aspiration, arrhythmia, respiratory failure and death. Alternatives to treatment were discussed, questions were answered. Patient is willing to proceed.   TEE scheduled for 10/30/2018 at 8:30 with Dr. Harrell Gave.  Roby Lofts, PA-C 10/28/2018 2:04 PM

## 2018-10-28 NOTE — Consult Note (Addendum)
ELECTROPHYSIOLOGY CONSULT NOTE  Patient ID: Morgan Boyer MRN: [id number], DOB/AGE: 03-Oct-1931   Admit date: 10/26/2018 Date of Consult: 10/28/2018  Primary Physician: Leighton Ruff, MD Primary Cardiologist: Dr. Angelena Form Reason for Consultation: Cryptogenic stroke ; recommendations regarding Implantable Loop Recorder, requested by Dr. Erlinda Hong  History of Present Illness Morgan Boyer was admitted on 10/26/2018 with LUE weakness, stroke.  They first developed symptoms while at home suddenly, she did get tPA 10/26/2018.   PMHx includes HTN, HLD, memory problems, peripheral neuropathy, OA, GERD, depression, known MR.  Remote h/o syncope in 2016 wore an EM then without significant findings  Neurology notes right MCA and left posterior MCA infarcts - cardioembolic pattern - highly concerning for occult afib.  she has undergone workup for stroke including echocardiogram and carotid angio.  The patient has been monitored on telemetry which has demonstrated sinus rhythm with no arrhythmias.    Neurology has deferred TEE for this patient   Echocardiogram this admission  IMPRESSIONS  1. The left ventricle has normal systolic function with an ejection fraction of 60-65%. The cavity size was normal. There is moderately increased left ventricular wall thickness. Left ventricular diastolic Doppler parameters are consistent with  pseudonormalization. Elevated left atrial and left ventricular end-diastolic pressures The E/e' is >15. There is incoordinate septal motion. No evidence of left ventricular regional wall motion abnormalities.  2. Left atrial size was moderately dilated.  3. The mitral valve is abnormal. Mild thickening of the mitral valve leaflet. There is mild to moderate mitral annular calcification present. Mitral valve regurgitation is moderate by color flow Doppler.  4. The tricuspid valve is grossly normal. Tricuspid valve regurgitation is moderate.  5. The aortic valve is tricuspid.  Moderate calcification of the aortic valve. No stenosis of the aortic valve.  6. The aorta is normal in size and structure.  7. The inferior vena cava was normal in size with <50% respiratory variability.  8. Cannot rule out PFO.  9. 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  SUMMARY LVEF 60-65%, moderate LVH, normal wall motion, grade 2 DD, elevated LV filling pressure, MAC with moderate MR, moderate LAE, moderate TR, RVSP 72 mmHg (moderate to severe pulmonary hypertension), IVC non-dilated but does not collapse, there is an atrial septal aneurysm (cannot exclude PFO) - although not well-visualized, there appears to be a mobile density in the left atrium in the area of the appendage that may be thrombus in the left atrial appendage (seen in the parasternal long-axis view). Recommend TEE in the setting of stroke to better visualize LAA and interatrial septum. I was asked to read this study on 7/28 as it was discovered this morning to be unread over the weekend (performed on 7/26). I have contacted Dr. Erlinda Hong with neurology to relay the findings at 11:34 am on 7/28.  FINDINGS  Left Ventricle: The left ventricle has normal systolic function, with an ejection fraction of 60-65%. The cavity size was normal. There is moderately increased left ventricular wall thickness. Left ventricular diastolic Doppler parameters are consistent  with pseudonormalization. Elevated left atrial and left ventricular end-diastolic pressures The E/e' is >15. There is incoordinate septal motion. No evidence of left ventricular regional wall motion abnormalities..  Right Ventricle: The right ventricle has low normal systolic function. The cavity was normal. There is no increase in right ventricular wall thickness.  Left Atrium: Left atrial size was moderately dilated.  Right Atrium: Right atrial size was normal in size. Right atrial pressure is estimated at 10 mmHg.  Interatrial Septum: Cannot rule out PFO. An The  interatrial septum is aneurysmal is seen.  Pericardium: There is no evidence of pericardial effusion.  Mitral Valve: The mitral valve is abnormal. Mild thickening of the mitral valve leaflet. There is mild to moderate mitral annular calcification present. Mitral valve regurgitation is moderate by color flow Doppler.  Tricuspid Valve: The tricuspid valve is grossly normal. Tricuspid valve regurgitation is moderate by color flow Doppler.  Aortic Valve: The aortic valve is tricuspid Moderate calcification of the aortic valve. Aortic valve regurgitation was not visualized by color flow Doppler. There is No stenosis of the aortic valve, with a calculated valve area of 1.32 cm.  Pulmonic Valve: The pulmonic valve was grossly normal. Pulmonic valve regurgitation is not visualized by color flow Doppler.  Aorta: The aorta is normal in size and structure.  Venous: The inferior vena cava measures 1.84 cm, is normal in size with less than 50% respiratory variability.       She has a 2/6 SM 03/2017 TTE had at least mod, can not r/o severe MR, severely dilated LA    Lab work is reviewed.  Discussed with neurology NP, confirmed request for loop implant give scattered infarct pattern.   Prior to admission, the patient denies chest pain, shortness of breath, dizziness, palpitations, or syncope.   She mentions a known murmur, otherwise denies any cardiac awareness or particular history.  She remains with L sided weakness,  recovering from her stroke with plans to CIR at discharge, pending their evaluation.   Past Medical History:  Diagnosis Date   Abnormality of gait    Adenomatous colon polyp    Arthritis    Back pain    Cancer (HCC)    skin cancers   Depression    Diverticulosis    DJD (degenerative joint disease) of knee    Dyslipidemia    Esophageal reflux    H/O hiatal hernia    Heart murmur    Herpes zoster    throat   History of shingles    left throat  07/2007   Hyperlipidemia    Hypertension    Memory changes 01/14/2013   Neuromuscular disorder ([place])    peripheral neuropathy   Nocturnal leg cramps 07/15/2014   Peripheral neuropathy    Polyneuropathy in other diseases classified elsewhere ([facility]) 01/14/2013     Surgical History:  Past Surgical History:  Procedure Laterality Date   ABDOMINAL HYSTERECTOMY     APPENDECTOMY     BACK SURGERY  07/2017   COLON SURGERY     2001 diverticulitis with infection ..drained surgically .   EYE SURGERY     cat ext bil    JOINT REPLACEMENT     right   KNEE ARTHROSCOPY  11/08/2011   TONSILLECTOMY     TOTAL KNEE ARTHROPLASTY  11/07/2011   Procedure: TOTAL KNEE ARTHROPLASTY;  Surgeon: Ninetta Lights, MD;  Location: [facility];  Service: Orthopedics;  Laterality: Left;   TUBAL LIGATION     WRIST SURGERY     for skin cancer     Medications Prior to Admission  Medication Sig Dispense Refill Last Dose   aspirin 81 MG tablet Take 81 mg by mouth daily.   10/25/2018 at Unknown time   b complex vitamins tablet Take 1 tablet by mouth daily.   10/25/2018 at Unknown time   baclofen (LIORESAL) 10 MG tablet Take 1 tablet (10 mg total) by mouth at bedtime. 90 tablet 3 10/25/2018 at Unknown time  Calcium Citrate (CITRACAL PO) Take 1 tablet by mouth 2 (two) times daily. + D3   10/25/2018 at Unknown time   diclofenac sodium (VOLTAREN) 1 % GEL Apply 1 application topically 3 (three) times daily as needed (pain).    Past Month at Unknown time   esomeprazole (NEXIUM) 20 MG capsule Take 20 mg by mouth daily.    10/25/2018 at Unknown time   furosemide (LASIX) 20 MG tablet Take 20 mg by mouth daily as needed for fluid.   1 Past Month at Unknown time   gabapentin (NEURONTIN) 600 MG tablet Take 1 tablet (600 mg total) by mouth at bedtime. 90 tablet 3 10/25/2018 at Unknown time   hydrocortisone 2.5 % cream Apply 1 application topically daily as needed (rash).   11 Past Month at Unknown time   ibuprofen  (ADVIL,MOTRIN) 200 MG tablet Take 400 mg by mouth 3 (three) times daily as needed for headache or moderate pain.    Past Month at Unknown time   lisinopril-hydrochlorothiazide (PRINZIDE,ZESTORETIC) 20-12.5 MG per tablet Take 1 tablet by mouth daily.    10/25/2018 at Unknown time   Polyethyl Glycol-Propyl Glycol (SYSTANE OP) Place 1 drop into both eyes 3 (three) times daily as needed (for dry eyes).    Past Month at Unknown time   simvastatin (ZOCOR) 20 MG tablet Take 20 mg by mouth every evening.   10/25/2018 at Unknown time   Valerian Root 500 MG CAPS Take 1 capsule by mouth at bedtime as needed (for stress).    10/25/2018 at Unknown time    Inpatient Medications:    stroke: mapping our early stages of recovery book   Does not apply Once   amLODipine  10 mg Oral Daily   aspirin EC  81 mg Oral Daily   B-complex with vitamin C  1 tablet Oral Daily   baclofen  10 mg Oral QHS   clopidogrel  75 mg Oral Daily   gabapentin  600 mg Oral QHS   lisinopril  20 mg Oral BID   pantoprazole  40 mg Oral Daily   simvastatin  20 mg Oral QPM    Allergies:  Allergies  Allergen Reactions   Septra [Sulfamethoxazole-Trimethoprim] Hives   Lotensin [Benazepril Hcl] Other (See Comments)    NOT KNOWN    Social History   Socioeconomic History   Marital status: Widowed    Spouse name: Not on file   Number of children: 3   Years of education: hs   Highest education level: Not on file  Occupational History   Occupation: retired  Scientist, product/process development strain: Not on file   Food insecurity    Worry: Not on file    Inability: Not on Lexicographer needs    Medical: Not on file    Non-medical: Not on file  Tobacco Use   Smoking status: Never Smoker   Smokeless tobacco: Never Used  Substance and Sexual Activity   Alcohol use: No   Drug use: No   Sexual activity: Never  Lifestyle   Physical activity    Days per week: Not on file    Minutes per  session: Not on file   Stress: Not on file  Relationships   Social connections    Talks on phone: Not on file    Gets together: Not on file    Attends religious service: Not on file    Active member of club or organization: Not on file  Attends meetings of clubs or organizations: Not on file    Relationship status: Not on file   Intimate partner violence    Fear of current or ex partner: Not on file    Emotionally abused: Not on file    Physically abused: Not on file    Forced sexual activity: Not on file  Other Topics Concern   Not on file  Social History Narrative   Husband deceased from lung cancer 2013/06/27.   Patient is right handed.   Patient drinks 2 cups of caffeine daily.     Family History  Problem Relation Age of Onset   Congestive Heart Failure Mother    Stroke Father 74   Heart attack Brother 74   Diabetes Brother       Review of Systems: All other systems reviewed and are otherwise negative except as noted above.  Physical Exam: Vitals:   10/27/18 1709 10/27/18 2110 10/27/18 2341 10/28/18 0325  BP: (!) 183/98 (!) 171/89 (!) 172/87 (!) 168/94  Pulse: 67 75 73 79  Resp: _0 Temp: 97.7 F (36.5 C) 99 F (37.2 C) 99 F (37.2 C) 97.9 F (36.6 C)  TempSrc: Oral Oral Oral Oral  SpO2: 100% 98% 98% 96%  Weight:      Height:        GEN- The patient is well appearing, alert and oriented x 3 today.   Head- normocephalic, atraumatic Eyes-  Sclera clear, conjunctiva pink Ears- hearing intact Oropharynx- clear Neck- supple Lungs- CTA, normal work of breathing Heart- RRR, 2/6 SM, no murmurs, rubs or gallops  GI- soft, NT, ND Extremities- no clubbing, cyanosis, or edema MS- no significant deformity or atrophy Skin- no rash or lesion Psych- euthymic mood, full affect   Labs:   Lab Results  Component Value Date   WBC 10.1 10/28/2018   HGB 11.7 (L) 10/28/2018   HCT 34.3 (L) 10/28/2018   MCV 83.9 10/28/2018   PLT 180 10/28/2018      Recent Labs  Lab 10/26/18 0238  10/28/18 0407  NA 133*   < > 132*  K 3.4*   < > 3.4*  CL 99   < > 99  CO2 23   < > 24  BUN 12   < > 11  CREATININE 0.77   < > 0.66  CALCIUM 8.9   < > 8.7*  PROT 6.3*  --   --   BILITOT 0.8  --   --   ALKPHOS 46  --   --   ALT 17  --   --   AST 27  --   --   GLUCOSE 110*   < > 107*   < > = values in this interval not displayed.   No results found for: CKTOTAL, CKMB, CKMBINDEX, TROPONINI Lab Results  Component Value Date   CHOL 149 10/26/2018   Lab Results  Component Value Date   HDL 55 10/26/2018   Lab Results  Component Value Date   LDLCALC 84 10/26/2018   Lab Results  Component Value Date   TRIG 52 10/26/2018   Lab Results  Component Value Date   CHOLHDL 2.7 10/26/2018   No results found for: LDLDIRECT  No results found for: DDIMER   Radiology/Studies:   Ct Angio Head W Or Wo Contrast Result Date: 10/26/2018 CLINICAL DATA:  Left-sided weakness EXAM: CT ANGIOGRAPHY HEAD AND NECK TECHNIQUE: Multidetector CT imaging of the head and neck was performed using the  standard protocol during bolus administration of intravenous contrast. Multiplanar CT image reconstructions and MIPs were obtained to evaluate the vascular anatomy. Carotid stenosis measurements (when applicable) are obtained utilizing NASCET criteria, using the distal internal carotid diameter as the denominator. CONTRAST:  16m OMNIPAQUE IOHEXOL 350 MG/ML SOLN COMPARISON:  Head CT same day FINDINGS: CTA NECK FINDINGS SKELETON: There is no bony spinal canal stenosis. No lytic or blastic lesion. OTHER NECK: Normal pharynx, larynx and major salivary glands. No cervical lymphadenopathy. Unremarkable thyroid gland. UPPER CHEST: No pneumothorax or pleural effusion. No nodules or masses. AORTIC ARCH: There is no calcific atherosclerosis of the aortic arch. There is no aneurysm, dissection or hemodynamically significant stenosis of the visualized ascending aorta and aortic arch.  Conventional 3 vessel aortic branching pattern. The visualized proximal subclavian arteries are widely patent. RIGHT CAROTID SYSTEM: --Common carotid artery: Widely patent origin without common carotid artery dissection or aneurysm. --Internal carotid artery: No dissection, occlusion or aneurysm. Mild atherosclerotic calcification at the carotid bifurcation without hemodynamically significant stenosis. --External carotid artery: No acute abnormality. LEFT CAROTID SYSTEM: --Common carotid artery: Widely patent origin without common carotid artery dissection or aneurysm. --Internal carotid artery: No dissection, occlusion or aneurysm. Mild atherosclerotic calcification at the carotid bifurcation without hemodynamically significant stenosis. --External carotid artery: No acute abnormality. VERTEBRAL ARTERIES: Left dominant configuration. Both origins are clearly patent. No dissection, occlusion or flow-limiting stenosis to the skull base (V1-V3 segments). CTA HEAD FINDINGS POSTERIOR CIRCULATION: --Vertebral arteries: Normal V4 segments. --Posterior inferior cerebellar arteries (PICA): Patent origins from the vertebral arteries. --Anterior inferior cerebellar arteries (AICA): Patent origins from the basilar artery. --Basilar artery: Normal. --Superior cerebellar arteries: Normal. --Posterior cerebral arteries (PCA): Normal. There are bilateral posterior communicating arteries (p-comm) that partially supply the PCAs. ANTERIOR CIRCULATION: --Intracranial internal carotid arteries: Atherosclerotic calcification of the internal carotid arteries at the skull base without hemodynamically significant stenosis. --Anterior cerebral arteries (ACA): Normal. Both A1 segments are present. Patent anterior communicating artery (a-comm). --Middle cerebral arteries (MCA): Normal. VENOUS SINUSES: As permitted by contrast timing, patent. ANATOMIC VARIANTS: None Review of the MIP images confirms the above findings. IMPRESSION: 1. No  emergent large vessel occlusion or hemodynamically significant stenosis. 2. Mild bilateral carotid bifurcation atherosclerosis with less than 50% stenosis. 3.  Aortic atherosclerosis (ICD10-I70.0). Electronically Signed   By: KUlyses JarredM.D.   On: 10/26/2018 02:32       Mr Brain Wo Contrast Result Date: 10/27/2018 CLINICAL DATA:  Stroke follow-up.  Left upper extremity weakness. EXAM: MRI HEAD WITHOUT CONTRAST TECHNIQUE: Multiplanar, multiecho pulse sequences of the brain and surrounding structures were obtained without intravenous contrast. COMPARISON:  CT head and CTA head neck 10/26/2018 FINDINGS: Brain: Diffusion-weighted images demonstrate scattered cortical infarcts involving the posterior right frontal lobe and parietal lobe near the vertex. There is no significant hemorrhage associated. T2 signal changes are associated with the areas of restricted diffusion. Additional cortical infarcts are present in the posterior parietal lobes bilaterally and left occipital lobe. T2 hyperintensity is present in these regions as well. Mild confluent periventricular white matter changes are present bilaterally. Mild atrophy is within normal limits for age. The ventricles are proportionate to the degree of atrophy. The internal auditory canals are within normal limits. The brainstem and cerebellum are within normal limits. Vascular: Flow is present in the major intracranial arteries. Skull and upper cervical spine: Grade 1 degenerative anterolisthesis is present at C3-4, stable. Craniocervical junction is normal. Midline structures are unremarkable. Sinuses/Orbits: Mild mucosal thickening is present in the anterior ethmoid  air cells bilaterally and inferior right maxillary sinus. The paranasal sinuses and mastoid air cells are otherwise clear. Bilateral lens replacements are noted. Globes and orbits are otherwise unremarkable. IMPRESSION: 1. Acute/subacute nonhemorrhagic scattered cortical infarcts involving the  posterior right frontal lobe consistent with the patient's left upper extremity weakness. 2. Acute/subacute scattered nonhemorrhagic cortical infarcts are present in the high right parietal lobe. 3. Acute/subacute nonhemorrhagic cortical infarcts in the more inferior posterior parietal lobes and left occipital lobe. 4. Scattered distribution suggests a central embolic source. 5. Mild sinus disease. 6. Grade 1 anterolisthesis at C3-4. Electronically Signed   By: San Morelle M.D.   On: 10/27/2018 02:01    12-lead ECG SR, I do not see any EKGs from this admission in Epic or her shadow chart All prior EKG's in EPIC reviewed with no documented atrial fibrillation  Telemetry SR, also has an ectopic atrial rhythm, HR 70's-80's, no AFib noted  Assessment and Plan:  1. Cryptogenic stroke  The patient presents with cryptogenic stroke.   I initially spoke to the patient 7/28 regarding stroke/loop/heart monitoring, we spoke at length about monitoring for afib with either a 30 day event monitor or an implantable loop recorder.  Risks, benefits, and alteratives to implantable loop recorder were discussed with the patient, she was agreeable/preferred to pursue loop implantpending her final read of her echo.   Her TTE noted ? LLA thrombus, density and is now pending TEE this AM  I revisited with the patient this AM, we re-discussed rational for heart rhythm monitoring in setting of stroke, event monitoring or loop implant.  She remains clear in her decision to proceed with implantable loop recorder.   At the patient's request, I have spoken with her son [name] Nicole Kindred), he was already aware of the plans and potential loop implant.  He had no follow up questions, reported Dr. Erlinda Hong has kept him very well updated and agrees to proceed with loop if TEE negative.  Will plan to proceed pending her TEE  Wound care was reviewed with the patient (keep incision clean and dry for 3 days).  Wound check will be scheduled  for the patient  Please call with questions.   Renee Dyane Dustman, PA-C 10/28/2018  EP Attending  Patient seen and examined. Agree with above. The patient has had an unexplained stroke. There was a question of LAA thrombus on TTE. She will undergo TEE. If no LAA thrombus on TEE then we will plan to place an ILR. I have reviewed the indications/risks/benefits/goals/expectations of the procedure with the patient and she wishes to proceed.  Mikle Bosworth.D.

## 2018-10-28 NOTE — Progress Notes (Signed)
Physical Therapy Treatment Patient Details Name: Morgan Boyer MRN: [id number] DOB: September 23, 1931 Today's Date: 10/28/2018    History of Present Illness Rasheedah F Mccaffery is a 85 y.o. female with a history of hypertension, hyperlipidemia, neuropathy who presents with left-sided weakness that started abruptly. MRI revealed R parietal lobe and L occipital lobe emobilic infarcts.    PT Comments    Patient seen for mobility progression. Pt is making progress toward PT goals. Current plan remains appropriate.    Follow Up Recommendations  CIR;Supervision/Assistance - 24 hour     Equipment Recommendations  (TBD)    Recommendations for Other Services       Precautions / Restrictions Precautions Precautions: Fall Restrictions Weight Bearing Restrictions: No    Mobility  Bed Mobility Overal bed mobility: Needs Assistance Bed Mobility: Supine to Sit     Supine to sit: Mod assist     General bed mobility comments: assist to elevate trunk into sitting, bring hips to EOB, and protect L UE with mobility; cues for sequencing   Transfers Overall transfer level: Needs assistance Equipment used: 2 person hand held assist Transfers: Sit to/from Stand Sit to Stand: +2 physical assistance;Min assist         General transfer comment: assist to power up into standing and to initially gain balance in standing; no knee buckling noted  Ambulation/Gait Ambulation/Gait assistance: Min assist;Mod assist;+2 safety/equipment Gait Distance (Feet): 70 Feet Assistive device: 1 person hand held assist;2 person hand held assist Gait Pattern/deviations: Step-to pattern;Decreased step length - left;Decreased weight shift to left;Decreased stride length Gait velocity: decreased   General Gait Details: pt with unsteady gait but no LOB; initially HHA +2 and then HHA +1 but with increased assist to maintain balance/weight shift; pt with increased instability when working on L visual tracking in  hallway   Stairs             Wheelchair Mobility    Modified Rankin (Stroke Patients Only) Modified Rankin (Stroke Patients Only) Pre-Morbid Rankin Score: Slight disability Modified Rankin: Moderately severe disability     Balance Overall balance assessment: Needs assistance Sitting-balance support: Feet supported;Single extremity supported Sitting balance-Leahy Scale: Fair     Standing balance support: Bilateral upper extremity supported;Single extremity supported Standing balance-Leahy Scale: Poor                              Cognition Arousal/Alertness: Awake/alert Behavior During Therapy: WFL for tasks assessed/performed Overall Cognitive Status: Impaired/Different from baseline Area of Impairment: Attention;Memory;Following commands;Safety/judgement;Problem solving                   Current Attention Level: Sustained Memory: Decreased short-term memory Following Commands: Follows one step commands with increased time Safety/Judgement: Decreased awareness of deficits   Problem Solving: Difficulty sequencing;Decreased initiation;Slow processing;Requires verbal cues;Requires tactile cues        Exercises      General Comments General comments (skin integrity, edema, etc.): pt educated to not get up unassisted by staff and to use call button      Pertinent Vitals/Pain      Home Living                      Prior Function            PT Goals (current goals can now be found in the care plan section) Progress towards PT goals: Progressing toward goals    Frequency  Min 4X/week      PT Plan Current plan remains appropriate    Co-evaluation              AM-PAC PT "6 Clicks" Mobility   Outcome Measure  Help needed turning from your back to your side while in a flat bed without using bedrails?: A Little Help needed moving from lying on your back to sitting on the side of a flat bed without using bedrails?: A  Little Help needed moving to and from a bed to a chair (including a wheelchair)?: A Little Help needed standing up from a chair using your arms (e.g., wheelchair or bedside chair)?: A Lot Help needed to walk in hospital room?: A Lot Help needed climbing 3-5 steps with a railing? : Total 6 Click Score: 14    End of Session Equipment Utilized During Treatment: Gait belt Activity Tolerance: Patient tolerated treatment well Patient left: in chair;with call bell/phone within reach(no chair alarms available on unit) Nurse Communication: Mobility status PT Visit Diagnosis: Unsteadiness on feet (R26.81)     Time: 3953-2023 PT Time Calculation (min) (ACUTE ONLY): 29 min  Charges:  $Gait Training: 23-37 mins                     Earney Navy, PTA Acute Rehabilitation Services Pager: [phone] Office: [phone]     Darliss Cheney 10/28/2018, 10:48 AM

## 2018-10-28 NOTE — Progress Notes (Signed)
  Speech Language Pathology Treatment: Cognitive-Linquistic  Patient Details Name: Morgan Boyer MRN: [id number] DOB: 1931-07-07 Today's Date: 10/28/2018 Time: 3276-1470 SLP Time Calculation (min) (ACUTE ONLY): 20 min  Assessment / Plan / Recommendation Clinical Impression  Pt seen for continued diagnostic treatment via completion the [facility] with visuospatial/executive section initiated yesterday. She scored a 25/30 (26 > considered WNL's). Treatment will focus on memory, visuospatial and executive functioning. Inpatient treatment recommended to continue to support independence for ADL's. Continue ST on acute.    HPI HPI: Morgan Boyer is a 85 y.o. female with a history of hypertension, hyperlipidemia, neuropathy who presents with left-sided weakness that started abruptly. MRI revealed R parietal lobe and L occipital lobe emobilic infarcts.      SLP Plan  Continue with current plan of care       Recommendations                   Follow up Recommendations: Inpatient Rehab SLP Visit Diagnosis: Cognitive communication deficit (L29.574) Plan: Continue with current plan of care                       Houston Siren 10/28/2018, 4:16 PM  Orbie Pyo Colvin Caroli.Ed Risk analyst [phone] Office [phone]

## 2018-10-29 DIAGNOSIS — R29898 Other symptoms and signs involving the musculoskeletal system: Secondary | ICD-10-CM

## 2018-10-29 LAB — CBC
HCT: 35.4 % — ABNORMAL LOW (ref 36.0–46.0)
Hemoglobin: 12.1 g/dL (ref 12.0–15.0)
MCH: 28.7 pg (ref 26.0–34.0)
MCHC: 34.2 g/dL (ref 30.0–36.0)
MCV: 83.9 fL (ref 80.0–100.0)
Platelets: 179 10*3/uL (ref 150–400)
RBC: 4.22 MIL/uL (ref 3.87–5.11)
RDW: 13.1 % (ref 11.5–15.5)
WBC: 9.1 10*3/uL (ref 4.0–10.5)
nRBC: 0 % (ref 0.0–0.2)

## 2018-10-29 LAB — BASIC METABOLIC PANEL
Anion gap: 8 (ref 5–15)
BUN: 9 mg/dL (ref 8–23)
CO2: 22 mmol/L (ref 22–32)
Calcium: 8.7 mg/dL — ABNORMAL LOW (ref 8.9–10.3)
Chloride: 106 mmol/L (ref 98–111)
Creatinine, Ser: 0.64 mg/dL (ref 0.44–1.00)
GFR calc Af Amer: >60 mL/min (ref >60)
GFR calc non Af Amer: >60 mL/min (ref >60)
Glucose, Bld: 98 mg/dL (ref 70–99)
Potassium: 4.1 mmol/L (ref 3.5–5.1)
Sodium: 136 mmol/L (ref 135–145)

## 2018-10-29 MED ORDER — CALCIUM CARBONATE ANTACID 500 MG PO CHEW
1.0000 | CHEWABLE_TABLET | Freq: Every day | ORAL | Status: DC | PRN
  Administered 2018-10-29: 200 mg via ORAL
  Filled 2018-10-29: qty 1

## 2018-10-29 MED ORDER — MUSCLE RUB 10-15 % EX CREA
TOPICAL_CREAM | CUTANEOUS | Status: DC | PRN
  Administered 2018-10-29: 13:00:00 via TOPICAL
  Filled 2018-10-29 (×2): qty 85

## 2018-10-29 MED ORDER — SODIUM CHLORIDE 0.9 % IV SOLN
INTRAVENOUS | Status: DC
  Administered 2018-10-30 (×2): via INTRAVENOUS

## 2018-10-29 NOTE — Progress Notes (Signed)
Inpatient Rehab Admissions:  Inpatient Rehab Consult received.  I met with patient at the bedside for rehabilitation assessment and to discuss goals and expectations of an inpatient rehab admission.  She is open to CIR, asks that I speak with her son, Nicole Kindred.  I was able to reach him and he is hopeful for CIR admission.  He states that family is able to provide 24/7 supervision at discharge if needed.  Note pt for TEE and possible loop tomorrow.  Will plan for possible admission pending results, insurance authorization, and if pt still meets criteria.   Signed: Shann Medal, PT, DPT Admissions Coordinator [phone] 10/29/18  12:56 PM

## 2018-10-29 NOTE — Consult Note (Signed)
[facility] Nurse wound consult note Reason for Consult: skin tear left hand and swelling  Wound type: skin tear Pressure Injury POA: NA Drainage (amount, consistency, odor) minimal per bedside nurse  Periwound: edema but with palpable pulses per nursing flow sheet  Dressing procedure/placement/frequency: Continue vaseline gauze as non adherent for skin tear per skin care order set, top with dry dressing. Change every other day.  Discussed POC with  bedside nurse.  Re consult if needed, will not follow at this time. Thanks  Tilton Marsalis [employer], RN,CWOCN, CNS, [place] [phone])

## 2018-10-29 NOTE — Progress Notes (Signed)
Physical Therapy Treatment Patient Details Name: Morgan Boyer MRN: [id number] DOB: 02/24/1932 Today's Date: 10/29/2018    History of Present Illness Morgan Boyer is a 85 y.o. female with a history of hypertension, hyperlipidemia, neuropathy who presents with left-sided weakness that started abruptly. MRI revealed R parietal lobe and L occipital lobe emobilic infarcts.    PT Comments    Patient seen for mobility progression. Current plan remains appropriate.    Follow Up Recommendations  CIR;Supervision/Assistance - 24 hour     Equipment Recommendations  (TBD)    Recommendations for Other Services       Precautions / Restrictions Precautions Precautions: Fall Restrictions Weight Bearing Restrictions: No    Mobility  Bed Mobility Overal bed mobility: Needs Assistance Bed Mobility: Supine to Sit     Supine to sit: Min assist     General bed mobility comments: assist to elevate trunk into sitting and to maintain balance while moving hips to EOB  Transfers Overall transfer level: Needs assistance Equipment used: 1 person hand held assist Transfers: Sit to/from Stand Sit to Stand: Min assist         General transfer comment: assist to power up into standing and for initial standing balance  Ambulation/Gait Ambulation/Gait assistance: Min assist;+2 safety/equipment(chair follow) Gait Distance (Feet): (75 ft X 2 with seated rest break) Assistive device: 1 person hand held assist Gait Pattern/deviations: Step-to pattern;Decreased step length - left;Decreased weight shift to left;Decreased stride length Gait velocity: decreased   General Gait Details: assist for balance/weight shifting; cues for increased bilat step lengths; increased instability noted with attempting changes in gait speed   Stairs             Wheelchair Mobility    Modified Rankin (Stroke Patients Only) Modified Rankin (Stroke Patients Only) Pre-Morbid Rankin Score: Slight  disability Modified Rankin: Moderately severe disability     Balance Overall balance assessment: Needs assistance Sitting-balance support: Feet supported;Single extremity supported Sitting balance-Leahy Scale: Fair     Standing balance support: Single extremity supported;During functional activity Standing balance-Leahy Scale: Poor                              Cognition Arousal/Alertness: Awake/alert Behavior During Therapy: WFL for tasks assessed/performed Overall Cognitive Status: Impaired/Different from baseline Area of Impairment: Safety/judgement;Problem solving;Attention                   Current Attention Level: Selective     Safety/Judgement: Decreased awareness of deficits   Problem Solving: Slow processing;Requires verbal cues;Requires tactile cues        Exercises      General Comments        Pertinent Vitals/Pain Pain Assessment: Faces Faces Pain Scale: Hurts a little bit Pain Location: low back Pain Descriptors / Indicators: Guarding;Discomfort Pain Intervention(s): Limited activity within patient's tolerance;Repositioned;Monitored during session    Home Living                      Prior Function            PT Goals (current goals can now be found in the care plan section) Acute Rehab PT Goals Patient Stated Goal: get stronger Progress towards PT goals: Progressing toward goals    Frequency    Min 4X/week      PT Plan Current plan remains appropriate    Co-evaluation  AM-PAC PT "6 Clicks" Mobility   Outcome Measure  Help needed turning from your back to your side while in a flat bed without using bedrails?: A Little Help needed moving from lying on your back to sitting on the side of a flat bed without using bedrails?: A Little Help needed moving to and from a bed to a chair (including a wheelchair)?: A Little Help needed standing up from a chair using your arms (e.g., wheelchair or  bedside chair)?: A Little Help needed to walk in hospital room?: A Lot Help needed climbing 3-5 steps with a railing? : A Lot 6 Click Score: 16    End of Session Equipment Utilized During Treatment: Gait belt Activity Tolerance: Patient tolerated treatment well Patient left: in chair;with call bell/phone within reach;with chair alarm set Nurse Communication: Mobility status PT Visit Diagnosis: Unsteadiness on feet (R26.81)     Time: 0211-1735 PT Time Calculation (min) (ACUTE ONLY): 27 min  Charges:  $Gait Training: 23-37 mins                     Earney Navy, PTA Acute Rehabilitation Services Pager: [phone] Office: [phone]     Darliss Cheney 10/29/2018, 9:58 AM

## 2018-10-29 NOTE — Progress Notes (Signed)
STROKE TEAM PROGRESS NOTE   SUBJECTIVE (INTERVAL HISTORY) Pt sitting in chair for lunch. Still has left arm weakness with minimal improvement. Pending TEE in am and hopefully CIR tomorrow.   OBJECTIVE Vitals:   10/28/18 1955 10/28/18 2356 10/29/18 0350 10/29/18 0850  BP: 130/73 (!) 149/85 (!) 150/85 (!) 144/69  Pulse: 68 71 74 73  Resp: _0 Temp: 98.6 F (37 C) 98.6 F (37 C) 98.7 F (37.1 C) 98.5 F (36.9 C)  TempSrc: Oral Oral Oral Oral  SpO2: 100% 100% 98% 100%  Weight:      Height:        CBC:  Recent Labs  Lab 10/26/18 0146  10/28/18 0407 10/29/18 0355  WBC 4.0   < > 10.1 9.1  NEUTROABS 2.1  --   --   --   HGB 11.9*   < > 11.7* 12.1  HCT 34.8*   < > 34.3* 35.4*  MCV 88.1   < > 83.9 83.9  PLT 64*   < > 180 179   < > = values in this interval not displayed.    Basic Metabolic Panel:  Recent Labs  Lab 10/28/18 0407 10/29/18 0355  NA 132* 136  K 3.4* 4.1  CL 99 106  CO2 24 22  GLUCOSE 107* 98  BUN 11 9  CREATININE 0.66 0.64  CALCIUM 8.7* 8.7*    Lipid Panel:     Component Value Date/Time   CHOL 149 10/26/2018 0541   TRIG 52 10/26/2018 0541   HDL 55 10/26/2018 0541   CHOLHDL 2.7 10/26/2018 0541   VLDL 10 10/26/2018 0541   LDLCALC 84 10/26/2018 0541   HgbA1c:  Lab Results  Component Value Date   HGBA1C 5.8 (H) 10/26/2018   Urine Drug Screen:     Component Value Date/Time   LABOPIA NONE DETECTED 10/28/2018 1644   COCAINSCRNUR NONE DETECTED 10/28/2018 1644   LABBENZ NONE DETECTED 10/28/2018 1644   AMPHETMU NONE DETECTED 10/28/2018 1644   THCU NONE DETECTED 10/28/2018 1644   LABBARB NONE DETECTED 10/28/2018 1644    Alcohol Level     Component Value Date/Time   ETH <10 10/26/2018 0146    IMAGING  Ct Head Code Stroke Wo Contrast 10/26/2018 1. No acute hemorrhage.  2. ASPECTS is 10.   Ct Angio Head W Or Wo Contrast Ct Angio Neck W Or Wo Contrast 10/26/2018 1. No emergent large vessel occlusion or hemodynamically significant  stenosis.  2. Mild bilateral carotid bifurcation atherosclerosis with less than 50% stenosis.  3. Aortic atherosclerosis (ICD10-I70.0). Mr Brain Wo Contrast 10/27/2018 1. Acute/subacute nonhemorrhagic scattered cortical infarcts involving the posterior right frontal lobe consistent with the patient's left upper extremity weakness. 2. Acute/subacute scattered nonhemorrhagic cortical infarcts are present in the high right parietal lobe. 3. Acute/subacute nonhemorrhagic cortical infarcts in the more inferior posterior parietal lobes and left occipital lobe. 4. Scattered distribution suggests a central embolic source. 5. Mild sinus disease. 6. Grade 1 anterolisthesis at C3-4.   2D Echocardiogram 10/27/2018 LVEF 60-65%, moderate LVH, normal wall motion, grade 2 DD, elevated LV filling pressure, MAC with moderate MR, moderate LAE, moderate TR, RVSP 72 mmHg (moderate to severe pulmonary hypertension), IVC non-dilated but does not collapse, there is an atrial septal aneurysm (cannot exclude PFO) - although not well-visualized, there appears to be a mobile density in the left atrium in the area of the appendage that may be thrombus in the left atrial appendage (seen in the parasternal long-axis view).  Recommend TEE in the setting of stroke to better visualize LAA and interatrial septum. I was asked to read this study on 7/28 as it was discovered this morning to be unread over the weekend (performed on 7/26).  TEE 10/30/2018 pending   PHYSICAL EXAM Temp:  [98 F (36.7 C)-98.7 F (37.1 C)] 98.5 F (36.9 C) (07/29 0850) Pulse Rate:  [68-74] 73 (07/29 0850) Resp:  [15-18] 17 (07/29 0850) BP: (130-158)/(69-91) 144/69 (07/29 0850) SpO2:  [96 %-100 %] 100 % (07/29 0850)  General - Well nourished, well developed, in no apparent distress.  Ophthalmologic - fundi not visualized due to noncooperation.  Cardiovascular - Regular rate and rhythm.  Mental Status -  Level of arousal and orientation to time,  place, and person were intact. Language including expression, naming, repetition, comprehension was assessed and found intact. Fund of Knowledge was assessed and was intact.  Cranial Nerves II - XII - II - visual field is full and intact. III, IV, VI - Extraocular movements intact. V - Facial sensation intact bilaterally. VII - Facial movement intact bilaterally. VIII - Hearing & vestibular intact bilaterally. X - Palate elevates symmetrically. XI - Chin turning & shoulder shrug intact bilaterally. XII - Tongue protrusion intact.  Motor Strength - The patient's strength was normal in all extremities except LUE 3/5 proximal and 2/5 finger movement.  Bulk was normal and fasciculations were absent.   Motor Tone - Muscle tone was assessed at the neck and appendages and was normal.  Reflexes - The patient's reflexes were symmetrical in all extremities and she had no pathological reflexes.  Sensory - Light touch, temperature/pinprick were assessed and were symmetrical.    Coordination - The patient had normal movements in the right hand with no ataxia or dysmetria.  Tremor was absent.  Gait and Station - deferred.   ASSESSMENT/PLAN [name] is a 85 y.o. female with history of hypertension, hyperlipidemia, neuropathy who presents with left-sided weakness that started abruptly. She received IV t-PA on Sunday 10/26/2018 at 0300.  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  CTA H&N - Mild bilateral carotid bifurcation atherosclerosis with less than 50% stenosis.   MRI right MCA patchy infarct and left posterior MCA infarct  2D Echo - EF 60-65%. Concern for LAA thrombus, cannot rule out PFO.  TEE pending   If TEE neg, place loop to look for AF as source of stroke  [employer] Virus 2  - negative  LDL - 84    HgbA1c - 5.8  VTE prophylaxis -  SCDs  aspirin 81 mg daily prior to admission, now on DAPT with ASA 81 and plavix 75  Therapy recommendations:  CIR  Disposition:  Pending  Hypertension  Resume home meds with lisinopril and HCTZ . Blood pressure improved on home meds . Gradually normalize BP in 3-5 days . Long-term BP goal normotensive  Hyperlipidemia  Lipid lowering medication PTA:  Zocor 20 mg daily  LDL 84, goal < 70  Current lipid lowering medication:  Zocor 20 mg daily  Continue statin at discharge  Thrombocytopenia   Platelet 64-> 198-180-179, baseline 150-200  Continue to monitor  ASA 81 at home  On DAPT now  Other Stroke Risk Factors  Advanced age  Family hx stroke (father)  Other Active Problems  Hypokalemia, resolved - 3.8->3.4 - supplement - 4.1  L hand edema w/ skin tear - WOC consulted  Hospital day # 3  Rosalin Hawking, MD PhD Stroke Neurology 10/29/2018 11:06 AM  To contact Stroke Continuity provider, please refer to [email or web address] After hours, contact General Neurology

## 2018-10-29 NOTE — Progress Notes (Signed)
  Speech Language Pathology Treatment: Cognitive-Linquistic  Patient Details Name: Morgan Boyer MRN: [id number] DOB: 15-Aug-1931 Today's Date: 10/29/2018 Time: 7371-0626 SLP Time Calculation (min) (ACUTE ONLY): 25 min  Assessment / Plan / Recommendation Clinical Impression  Pt was seen for cognitive-linguistic treatment and was cooperative throughout the session. She intially reported that she believes her cognition is within normal limits at this time but during completion of time management problems she indicated that she may not have had this much difficulty prior to admission. She was able to recall some of the events from today, the plan for the TEE, the purpose of the TEE, and the discharge plan. She stated that she believes that she performed better yesterday with PT than today since she was tired this morning. She achieved 60% accuracy with a medication management (prescription) task increasing to 100% accuracy with min-mod cues. She required moderate support during time management tasks and ultimately requested that the session be terminated due to her becoming fatigued. SLP wil continue to follow pt.    HPI HPI: Morgan Boyer is a 85 y.o. female with a history of hypertension, hyperlipidemia, neuropathy who presents with left-sided weakness that started abruptly. MRI revealed R parietal lobe and L occipital lobe emobilic infarcts.      SLP Plan  Continue with current plan of care       Recommendations                   Follow up Recommendations: Inpatient Rehab SLP Visit Diagnosis: Cognitive communication deficit (R48.546) Plan: Continue with current plan of care       Amar Sippel I. Hardin Negus, [place], [place] Office number [phone] Pager [place] 10/29/2018, 4:26 PM

## 2018-10-29 NOTE — Care Management Important Message (Signed)
Important Message  Patient Details  Name: Morgan Boyer MRN: [id number] Date of Birth: [birth year]   Medicare Important Message Given:  Yes     Orbie Pyo 10/29/2018, 3:02 PM

## 2018-10-30 ENCOUNTER — Encounter (HOSPITAL_COMMUNITY): Payer: Self-pay

## 2018-10-30 ENCOUNTER — Encounter (HOSPITAL_COMMUNITY)
Admission: EM | Disposition: A | Discharge status: Rehabilitation Facility | Payer: Self-pay | Source: Home / Self Care | Attending: Neurology

## 2018-10-30 ENCOUNTER — Inpatient Hospital Stay (HOSPITAL_COMMUNITY): Payer: PPO

## 2018-10-30 ENCOUNTER — Other Ambulatory Visit (HOSPITAL_COMMUNITY): Payer: PPO

## 2018-10-30 DIAGNOSIS — I63413 Cerebral infarction due to embolism of bilateral middle cerebral arteries: Principal | ICD-10-CM

## 2018-10-30 DIAGNOSIS — I6389 Other cerebral infarction: Secondary | ICD-10-CM

## 2018-10-30 DIAGNOSIS — K222 Esophageal obstruction: Secondary | ICD-10-CM

## 2018-10-30 LAB — BASIC METABOLIC PANEL
Anion gap: 8 (ref 5–15)
BUN: 12 mg/dL (ref 8–23)
CO2: 25 mmol/L (ref 22–32)
Calcium: 8.5 mg/dL — ABNORMAL LOW (ref 8.9–10.3)
Chloride: 105 mmol/L (ref 98–111)
Creatinine, Ser: 0.67 mg/dL (ref 0.44–1.00)
GFR calc Af Amer: >60 mL/min (ref >60)
GFR calc non Af Amer: >60 mL/min (ref >60)
Glucose, Bld: 100 mg/dL — ABNORMAL HIGH (ref 70–99)
Potassium: 3.5 mmol/L (ref 3.5–5.1)
Sodium: 138 mmol/L (ref 135–145)

## 2018-10-30 LAB — CBC
HCT: 35.8 % — ABNORMAL LOW (ref 36.0–46.0)
Hemoglobin: 12 g/dL (ref 12.0–15.0)
MCH: 28.6 pg (ref 26.0–34.0)
MCHC: 33.5 g/dL (ref 30.0–36.0)
MCV: 85.4 fL (ref 80.0–100.0)
Platelets: 197 10*3/uL (ref 150–400)
RBC: 4.19 MIL/uL (ref 3.87–5.11)
RDW: 13.2 % (ref 11.5–15.5)
WBC: 9.4 10*3/uL (ref 4.0–10.5)
nRBC: 0 % (ref 0.0–0.2)

## 2018-10-30 SURGERY — CANCELLED PROCEDURE

## 2018-10-30 SURGERY — LOOP RECORDER INSERTION

## 2018-10-30 NOTE — Progress Notes (Signed)
STROKE TEAM PROGRESS NOTE   SUBJECTIVE (INTERVAL HISTORY) Pt sitting in chair, not in distress. Pt went to TEE this am but not able to perform due to hx of esophageal stricture. Had MBS showed no significant stricture. Will do TEE in am.   OBJECTIVE Vitals:   10/30/18 0359 10/30/18 0731 10/30/18 0752 10/30/18 0915  BP: (!) 143/78 (!) 160/85 (!) 166/111 (!) 142/72  Pulse: 70 79 72 66  Resp: 18 15 (!) 21   Temp: 97.8 F (36.6 C) 98.9 F (37.2 C) 98.4 F (36.9 C)   TempSrc: Oral Oral Oral   SpO2: 99% 99% 99%   Weight:      Height:        CBC:  Recent Labs  Lab 10/26/18 0146  10/29/18 0355 10/30/18 0406  WBC 4.0   < > 9.1 9.4  NEUTROABS 2.1  --   --   --   HGB 11.9*   < > 12.1 12.0  HCT 34.8*   < > 35.4* 35.8*  MCV 88.1   < > 83.9 85.4  PLT 64*   < > 179 197   < > = values in this interval not displayed.    Basic Metabolic Panel:  Recent Labs  Lab 10/29/18 0355 10/30/18 0406  NA 136 138  K 4.1 3.5  CL 106 105  CO2 22 25  GLUCOSE 98 100*  BUN 9 12  CREATININE 0.64 0.67  CALCIUM 8.7* 8.5*    Lipid Panel:     Component Value Date/Time   CHOL 149 10/26/2018 0541   TRIG 52 10/26/2018 0541   HDL 55 10/26/2018 0541   CHOLHDL 2.7 10/26/2018 0541   VLDL 10 10/26/2018 0541   LDLCALC 84 10/26/2018 0541   HgbA1c:  Lab Results  Component Value Date   HGBA1C 5.8 (H) 10/26/2018   Urine Drug Screen:     Component Value Date/Time   LABOPIA NONE DETECTED 10/28/2018 1644   COCAINSCRNUR NONE DETECTED 10/28/2018 1644   LABBENZ NONE DETECTED 10/28/2018 1644   AMPHETMU NONE DETECTED 10/28/2018 1644   THCU NONE DETECTED 10/28/2018 1644   LABBARB NONE DETECTED 10/28/2018 1644    Alcohol Level     Component Value Date/Time   ETH <10 10/26/2018 0146    IMAGING Ct Head Code Stroke Wo Contrast 10/26/2018 1. No acute hemorrhage.  2. ASPECTS is 10.   Ct Angio Head W Or Wo Contrast Ct Angio Neck W Or Wo Contrast 10/26/2018 1. No emergent large vessel occlusion or  hemodynamically significant stenosis.  2. Mild bilateral carotid bifurcation atherosclerosis with less than 50% stenosis.  3. Aortic atherosclerosis (ICD10-I70.0). Mr Brain Wo Contrast 10/27/2018 1. Acute/subacute nonhemorrhagic scattered cortical infarcts involving the posterior right frontal lobe consistent with the patient's left upper extremity weakness. 2. Acute/subacute scattered nonhemorrhagic cortical infarcts are present in the high right parietal lobe. 3. Acute/subacute nonhemorrhagic cortical infarcts in the more inferior posterior parietal lobes and left occipital lobe. 4. Scattered distribution suggests a central embolic source. 5. Mild sinus disease. 6. Grade 1 anterolisthesis at C3-4.   2D Echocardiogram 10/27/2018 LVEF 60-65%, moderate LVH, normal wall motion, grade 2 DD, elevated LV filling pressure, MAC with moderate MR, moderate LAE, moderate TR, RVSP 72 mmHg (moderate to severe pulmonary hypertension), IVC non-dilated but does not collapse, there is an atrial septal aneurysm (cannot exclude PFO) - although not well-visualized, there appears to be a mobile density in the left atrium in the area of the appendage that may be thrombus  in the left atrial appendage (seen in the parasternal long-axis view). Recommend TEE in the setting of stroke to better visualize LAA and interatrial septum. I was asked to read this study on 7/28 as it was discovered this morning to be unread over the weekend (performed on 7/26).  TEE 10/30/2018 Pending  Dg Esophagus W Single Cm (sol Or Thin Ba)  Result Date: 10/30/2018 CLINICAL DATA:  Esophageal stricture. EXAM: ESOPHOGRAM/BARIUM SWALLOW TECHNIQUE: Single contrast examination was performed using thin barium. FLUOROSCOPY TIME:  Fluoroscopy Time:  1.8 minutes Radiation Exposure Index (if provided by the fluoroscopic device): 15.80 mGy Number of Acquired Spot Images: 2 COMPARISON:  CT angiography neck 04/27/2018 FINDINGS: A problem-oriented esophagram was  performed to rule out stricture. Fluoroscopic evaluation demonstrates normal caliber of the esophagus. No evidence of significant fixed stricture. No large hiatal hernia. Normal esophageal motility was observed. No reflux was observed during the examination. The patient swallowed a 13 mm barium tablet which freely passed into the stomach. The examination was performed as a single contrast study, which limited evaluation of the esophageal mucosa. IMPRESSION: Problem-oriented esophagram performed. No evidence of significant fixed esophageal stricture. Electronically Signed   By: Kellie Simmering   On: 10/30/2018 13:36    PHYSICAL EXAM Temp:  [97.6 F (36.4 C)-98.9 F (37.2 C)] 98.4 F (36.9 C) (07/30 0752) Pulse Rate:  [66-79] 66 (07/30 0915) Resp:  [12-21] 21 (07/30 0752) BP: (130-166)/(61-111) 142/72 (07/30 0915) SpO2:  [98 %-100 %] 99 % (07/30 0752)  General - Well nourished, well developed, in no apparent distress.  Ophthalmologic - fundi not visualized due to noncooperation.  Cardiovascular - Regular rate and rhythm.  Mental Status -  Level of arousal and orientation to time, place, and person were intact. Language including expression, naming, repetition, comprehension was assessed and found intact. Fund of Knowledge was assessed and was intact.  Cranial Nerves II - XII - II - visual field is full and intact. III, IV, VI - Extraocular movements intact. V - Facial sensation intact bilaterally. VII - Facial movement intact bilaterally. VIII - Hearing & vestibular intact bilaterally. X - Palate elevates symmetrically. XI - Chin turning & shoulder shrug intact bilaterally. XII - Tongue protrusion intact.  Motor Strength - The patient's strength was normal in all extremities except LUE 3/5 proximal and 2/5 finger movement.  Bulk was normal and fasciculations were absent.   Motor Tone - Muscle tone was assessed at the neck and appendages and was normal.  Reflexes - The patient's  reflexes were symmetrical in all extremities and she had no pathological reflexes.  Sensory - Light touch, temperature/pinprick were assessed and were symmetrical.    Coordination - The patient had normal movements in the right hand with no ataxia or dysmetria.  Tremor was absent.  Gait and Station - deferred.   ASSESSMENT/PLAN Morgan Boyer is a 85 y.o. female with history of hypertension, hyperlipidemia, neuropathy who presents with left-sided weakness that started abruptly. She received IV t-PA on Sunday 10/26/2018 at 0300.  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  CTA H&N - Mild bilateral carotid bifurcation atherosclerosis with less than 50% stenosis.   MRI right MCA patchy infarct and left posterior MCA infarct  2D Echo - EF 60-65%. Concern for LAA thrombus, cannot rule out PFO.  TEE pending in am  If TEE neg, place  loop to look for AF as source of stroke  [employer] Virus 2  - negative  LDL - 84    HgbA1c - 5.8  VTE prophylaxis - SCDs  aspirin 81 mg daily prior to admission, now on DAPT with ASA 81 and plavix 75  Therapy recommendations:  CIR  Disposition:  Have insurance approval for d/c to CIR Friday 7/31  Hypertension  Resume home meds with lisinopril and HCTZ . Blood pressure improved on home meds . Gradually normalize BP in 3-5 days . Long-term BP goal normotensive  Hyperlipidemia  Lipid lowering medication PTA:  Zocor 20 mg daily  LDL 84, goal < 70  Current lipid lowering medication:  Zocor 20 mg daily  Continue statin at discharge  Thrombocytopenia   Platelet 64-> 198-180-179-197, baseline 150-200  Continue to monitor  ASA 81 at home  On DAPT now  Other Stroke Risk Factors  Advanced age  Family hx stroke (father)  Other Active Problems  Hypokalemia, resolved - 3.8->3.4 - supplement -  4.1->3.5  L hand edema w/ skin tear - WOC consulted  Esophageal stricture requiring dilatation in past. BA swallow no significant stricture  Hospital day # 4  Rosalin Hawking, MD PhD Stroke Neurology 10/30/2018 12:18 PM  To contact Stroke Continuity provider, please refer to [email or web address] After hours, contact General Neurology

## 2018-10-30 NOTE — Progress Notes (Signed)
Cardiology has been consulted for TEE for stroke. On discussion with patient, she has a history of esophageal stricture requiring dilation. She also endorses that currently pills and food get stuck. I do not see recent endoscopy or barium/functional study evaluating this (most recent speech/motility eval I see is from 2010).  Given concern for thrombus in LA/LAA on chest wall echo, a TEE would be ideal, but with her history she is high risk for perforation.   I would recommend barium or motility study of esophagus first, and then if clear could consider TEE tomorrow. Alternatively, if there is concern more urgently for LA/LAA thrombus, she could have a cardiac CTA to evaluation LA/LAA thrombus.  Buford Dresser, MD, PhD [facility]  [address] [place], [place] [address] [phone]

## 2018-10-30 NOTE — Progress Notes (Signed)
Physical Therapy Treatment Patient Details Name: Morgan Boyer MRN: [id number] DOB: 1931/05/03 Today's Date: 10/30/2018    History of Present Illness Morgan Boyer is a 85 y.o. female with a history of hypertension, hyperlipidemia, neuropathy who presents with left-sided weakness that started abruptly. MRI revealed R parietal lobe and L occipital lobe emobilic infarcts.    PT Comments    Patient seen for mobility progression. Pt continues to require assistance for gait due to L side inattention/weakness. Continue to progress as tolerated and recommend CIR for further skilled PT services to maximize independence and safety with mobility.     Follow Up Recommendations  CIR;Supervision/Assistance - 24 hour     Equipment Recommendations  (TBD)    Recommendations for Other Services       Precautions / Restrictions Precautions Precautions: Fall Restrictions Weight Bearing Restrictions: No    Mobility  Bed Mobility Overal bed mobility: Needs Assistance Bed Mobility: Supine to Sit     Supine to sit: Min assist     General bed mobility comments: assist to elevate trunk into sitting and to maintain balance while moving hips to EOB  Transfers Overall transfer level: Needs assistance Equipment used: 1 person hand held assist Transfers: Sit to/from Stand Sit to Stand: Mod assist         General transfer comment: increased assist to power up and to gain balance today with initial stand from EOB; pt c/o increased back pain today compared to yesterday  Ambulation/Gait Ambulation/Gait assistance: Min assist;Mod assist Gait Distance (Feet): 100 Feet Assistive device: 1 person hand held assist(assist at trunk with gait belt) Gait Pattern/deviations: Step-to pattern;Decreased step length - left;Decreased weight shift to left;Decreased stride length Gait velocity: decreased   General Gait Details: assist for balance/weight shifting; cues for increased bilat step lengths and  for attention to L side; pt demonstrate L inattention and difficulty navigating environment    Stairs             Wheelchair Mobility    Modified Rankin (Stroke Patients Only) Modified Rankin (Stroke Patients Only) Pre-Morbid Rankin Score: Slight disability Modified Rankin: Moderately severe disability     Balance Overall balance assessment: Needs assistance Sitting-balance support: Feet supported;Single extremity supported Sitting balance-Leahy Scale: Fair Sitting balance - Comments: min guard for sitting balance   Standing balance support: Single extremity supported;During functional activity Standing balance-Leahy Scale: Poor                              Cognition Arousal/Alertness: Awake/alert Behavior During Therapy: WFL for tasks assessed/performed Overall Cognitive Status: Impaired/Different from baseline Area of Impairment: Safety/judgement;Problem solving;Attention                   Current Attention Level: Selective Memory: Decreased short-term memory Following Commands: Follows one step commands with increased time Safety/Judgement: Decreased awareness of deficits   Problem Solving: Slow processing;Requires verbal cues;Requires tactile cues General Comments: L side inattention      Exercises      General Comments        Pertinent Vitals/Pain Pain Assessment: Faces Faces Pain Scale: Hurts little more Pain Location: low back Pain Descriptors / Indicators: Guarding;Discomfort Pain Intervention(s): Limited activity within patient's tolerance;Monitored during session;Repositioned    Home Living                      Prior Function  PT Goals (current goals can now be found in the care plan section) Acute Rehab PT Goals Patient Stated Goal: get stronger Progress towards PT goals: Progressing toward goals    Frequency    Min 4X/week      PT Plan Current plan remains appropriate    Co-evaluation               AM-PAC PT "6 Clicks" Mobility   Outcome Measure  Help needed turning from your back to your side while in a flat bed without using bedrails?: A Little Help needed moving from lying on your back to sitting on the side of a flat bed without using bedrails?: A Little Help needed moving to and from a bed to a chair (including a wheelchair)?: A Little Help needed standing up from a chair using your arms (e.g., wheelchair or bedside chair)?: A Little Help needed to walk in hospital room?: A Lot Help needed climbing 3-5 steps with a railing? : A Lot 6 Click Score: 16    End of Session Equipment Utilized During Treatment: Gait belt Activity Tolerance: Patient tolerated treatment well Patient left: in chair;with call bell/phone within reach;with chair alarm set Nurse Communication: Mobility status PT Visit Diagnosis: Unsteadiness on feet (R26.81)     Time: 0947-0962 PT Time Calculation (min) (ACUTE ONLY): 33 min  Charges:  $Gait Training: 23-37 mins                     Morgan Boyer, PTA Acute Rehabilitation Services Pager: [phone] Office: [phone]     Darliss Cheney 10/30/2018, 4:01 PM

## 2018-10-30 NOTE — Progress Notes (Signed)
OT Cancellation Note  Patient Details Name: Morgan Boyer MRN: [id number] DOB: 02/11/1932   Cancelled Treatment:    Reason Eval/Treat Not Completed: Patient at procedure or test/ unavailable. Pt out of room for procedure. Plan to reattempt at a later time.  Tyrone Schimke, OT Acute Rehabilitation Services Pager: [phone] Office: [phone]  10/30/2018, 1:09 PM

## 2018-10-30 NOTE — PMR Pre-admission (Signed)
PMR Admission Coordinator Pre-Admission Assessment  Patient: Morgan Boyer is an 85 y.o., female MRN: [id number] DOB: 11/24/1931 Height: '5\' 2"'  (157.5 cm) Weight: 68 kg  Insurance Information HMO:     PPO: yes     PCP:      IPA:      80/20:      OTHER:  PRIMARY: [employer]      Policy#: [id number]      Subscriber: patient CM Name: Tammy      Phone#: [phone]     Fax#: [place] Pre-Cert#: [id number] auth provided by Lynelle Smoke for 7 days     Employer:  Benefits:  Phone #: [phone]     Name:  Eff. Date: 04/02/2018     Deduct: $0      Out of Pocket Max: $3400 (met $90)      Life Max: n/a CIR: $295/day for days 1-6      SNF: $20/day for days 1-20 Outpatient:      Co-Pay: $15/visit Home Health: 100%      Co-Pay:  DME: 80%     Co-Pay: 20% Providers: preferred network SECONDARY:       Policy#:       Subscriber:  CM Name:       Phone#:      Fax#:  Pre-Cert#:       Employer:  Benefits:  Phone #:      Name:  Eff. Date:      Deduct:       Out of Pocket Max:       Life Max:  CIR:       SNF:  Outpatient:      Co-Pay:  Home Health:       Co-Pay:  DME:      Co-Pay:   Medicaid Application Date:       Case Manager:  Disability Application Date:       Case Worker:   The "Data Collection Information Summary" for patients in Inpatient Rehabilitation Facilities with attached "Privacy Act [place] Records" was provided and verbally reviewed with: Patient and Family  Emergency Contact Information Contact Information    Name Relation Home Work Mobile   [place] A Nicole Kindred) Son   [phone]      Current Medical History  Patient Admitting Diagnosis: L and R MCA CVA  History of Present Illness: Pt is an 85 y/o female with PMH of HTN and neuropathy that presents to [facility] on 7/26 with sudden onset of L sided weakness.  Neuro workup complete.  CT negative, MRI revealed R MCA patchy infarct and L posterior MCA infarct.  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  TEE complete this afternoon  Complete NIHSS TOTAL: 3  Patient's medical record from [facility] has been reviewed by the rehabilitation admission coordinator and physician.  Past Medical History  Past Medical History:  Diagnosis Date  . Abnormality of gait   . Adenomatous colon polyp   . Arthritis   . Back pain   . Cancer ([place])    skin cancers  . Depression   . Diverticulosis   . DJD (degenerative joint disease) of knee   . Dyslipidemia   . Esophageal reflux   . H/O hiatal hernia   . Heart murmur   . Herpes zoster    throat  . History of shingles    left throat 07/2007  . Hyperlipidemia   . Hypertension   . Memory changes  01/14/2013  . Neuromuscular disorder ([place])    peripheral neuropathy  . Nocturnal leg cramps 07/15/2014  . Peripheral neuropathy   . Polyneuropathy in other diseases classified elsewhere ([place]) 01/14/2013    Family History   family history includes Congestive Heart Failure in her mother; Diabetes in her brother; Heart attack (age of onset: 108) in her brother; Stroke (age of onset: 24) in her father.  Prior Rehab/Hospitalizations Has the patient had prior rehab or hospitalizations prior to admission? No  Has the patient had major surgery during 100 days prior to admission? No   Current Medications  Current Facility-Administered Medications:  .  [MAR Hold]  stroke: mapping our early stages of recovery book, , Does not apply, Once, Roland Rack P, MD .  0.9 %  sodium chloride infusion, , , Continuous PRN, Fay Records, MD, Last Rate: 20 mL/hr at 10/31/18 1331, 500 mL at 10/31/18 1331 .  [MAR Hold] acetaminophen (TYLENOL) tablet 650 mg, 650 mg, Oral, Q4H PRN, 650 mg at 10/30/18 2133 **OR** [DISCONTINUED] acetaminophen (TYLENOL) solution 650 mg, 650 mg, Per Tube, Q4H PRN **OR** [DISCONTINUED] acetaminophen (TYLENOL) suppository 650 mg, 650 mg, Rectal, Q4H PRN, Greta Doom, MD .  Doug Sou Hold] amLODipine (NORVASC) tablet 10 mg,  10 mg, Oral, Daily, Rosalin Hawking, MD, 10 mg at 10/30/18 6948 .  [MAR Hold] aspirin EC tablet 81 mg, 81 mg, Oral, Daily, Rosalin Hawking, MD, 81 mg at 10/30/18 0918 .  [MAR Hold] B-complex with vitamin C tablet 1 tablet, 1 tablet, Oral, Daily, Rosalin Hawking, MD, 1 tablet at 10/30/18 [phone] .  [MAR Hold] baclofen (LIORESAL) tablet 10 mg, 10 mg, Oral, QHS, Rosalin Hawking, MD, 10 mg at 10/30/18 2134 .  [MAR Hold] calcium carbonate (TUMS - dosed in mg elemental calcium) chewable tablet 200 mg of elemental calcium, 1 tablet, Oral, Daily PRN, Amie Portland, MD, 200 mg of elemental calcium at 10/29/18 0545 .  [MAR Hold] clopidogrel (PLAVIX) tablet 75 mg, 75 mg, Oral, Daily, Rosalin Hawking, MD, 75 mg at 10/30/18 0918 .  [MAR Hold] gabapentin (NEURONTIN) capsule 600 mg, 600 mg, Oral, QHS, Greta Doom, MD, 600 mg at 10/30/18 2134 .  [MAR Hold] lisinopril (ZESTRIL) tablet 20 mg, 20 mg, Oral, BID, 20 mg at 10/30/18 2134 **OR** [DISCONTINUED] hydrochlorothiazide (MICROZIDE) capsule 12.5 mg, 12.5 mg, Oral, Daily, Rosalin Hawking, MD .  Doug Sou Hold] Muscle Rub CREA, , Topical, PRN, Amie Portland, MD .  Doug Sou Hold] pantoprazole (PROTONIX) EC tablet 40 mg, 40 mg, Oral, Daily, Rosalin Hawking, MD, 40 mg at 10/30/18 7035 .  [MAR Hold] simvastatin (ZOCOR) tablet 20 mg, 20 mg, Oral, QPM, Rosalin Hawking, MD, 20 mg at 10/30/18 1724  Patients Current Diet:  Diet Order            Diet NPO time specified  Diet effective now              Precautions / Restrictions Precautions Precautions: Fall Restrictions Weight Bearing Restrictions: No   Has the patient had 2 or more falls or a fall with injury in the past year? No  Prior Activity Level Limited Community (1-2x/wk): was still driving, was going out of the house 1-2x/week, using a rollator  Prior Functional Level Self Care: Did the patient need help bathing, dressing, using the toilet or eating? Independent  Indoor Mobility: Did the patient need assistance with walking from  room to room (with or without device)? Independent  Stairs: Did the patient need assistance with internal or external stairs (  with or without device)? Independent  Functional Cognition: Did the patient need help planning regular tasks such as shopping or remembering to take medications? Independent  Home Assistive Devices / Equipment Home Equipment: Environmental consultant - 4 wheels, Shower seat, Grab bars - toilet, Grab bars - tub/shower, Hand held shower head  Prior Device Use: Indicate devices/aids used by the patient prior to current illness, exacerbation or injury? rollator  Current Functional Level Cognition  Arousal/Alertness: Awake/alert Overall Cognitive Status: Impaired/Different from baseline Current Attention Level: Selective Orientation Level: Oriented X4 Following Commands: Follows one step commands with increased time Safety/Judgement: Decreased awareness of deficits General Comments: L side inattention Attention: Sustained Sustained Attention: Appears intact Memory: (cont to assess next session) Awareness: Impaired Awareness Impairment: Anticipatory impairment Problem Solving: Impaired Safety/Judgment: (?)    Extremity Assessment (includes Sensation/Coordination)  Upper Extremity Assessment: LUE deficits/detail LUE Deficits / Details: grossly 3-/5; pt appears to have more proximal strength vs distal, minimal grip ROM/strength, L hand also bandaged due to recent fall last PM and skin tear which limits pt's ROM, question decreased sensation  LUE Sensation: decreased proprioception LUE Coordination: decreased fine motor, decreased gross motor  Lower Extremity Assessment: Defer to PT evaluation LLE Deficits / Details: grossly 3+/5 LLE Sensation: decreased light touch LLE Coordination: decreased gross motor    ADLs  Overall ADL's : Needs assistance/impaired Eating/Feeding: Minimal assistance, Sitting Grooming: Set up, Minimal assistance, Sitting, Wash/dry face, Wash/dry  hands Upper Body Bathing: Minimal assistance, Sitting Lower Body Bathing: Maximal assistance, +2 for physical assistance, +2 for safety/equipment, Sit to/from stand Upper Body Dressing : Moderate assistance, Sitting Lower Body Dressing: Maximal assistance, +2 for physical assistance, +2 for safety/equipment, Sit to/from stand Lower Body Dressing Details (indicate cue type and reason): pt requires assist to don socks, once started over foot pt able to assist with donning sock over heel/ankle, but requires increased time/effort as pt only using RUE to complete task Toilet Transfer: Moderate assistance, +2 for physical assistance, +2 for safety/equipment, Stand-pivot, BSC Toilet Transfer Details (indicate cue type and reason): use of +2 HHA Toileting- Clothing Manipulation and Hygiene: Moderate assistance, +2 for physical assistance, +2 for safety/equipment, Sit to/from stand Toileting - Clothing Manipulation Details (indicate cue type and reason): +2 assist for standing balance; pt able to perform peri-care both anteriorly and posterior, assist provided to ensure thoroughness with posterior hygiene after BM Functional mobility during ADLs: Moderate assistance, +2 for physical assistance, +2 for safety/equipment(HHA; stand pivot transfer) General ADL Comments: pt presenting with cognitive impairments, L side weakness, decreased balance     Mobility  Overal bed mobility: Needs Assistance Bed Mobility: Supine to Sit Supine to sit: Min assist General bed mobility comments: assist to elevate trunk into sitting and to maintain balance while moving hips to EOB    Transfers  Overall transfer level: Needs assistance Equipment used: 1 person hand held assist Transfers: Sit to/from Stand Sit to Stand: Mod assist Stand pivot transfers: Mod assist, +2 physical assistance General transfer comment: increased assist to power up and to gain balance today with initial stand from EOB; pt c/o increased back pain  today compared to yesterday    Ambulation / Gait / Stairs / Wheelchair Mobility  Ambulation/Gait Ambulation/Gait assistance: Min assist, Mod assist Gait Distance (Feet): 100 Feet Assistive device: 1 person hand held assist(assist at trunk with gait belt) Gait Pattern/deviations: Step-to pattern, Decreased step length - left, Decreased weight shift to left, Decreased stride length General Gait Details: assist for balance/weight shifting; cues for increased bilat  step lengths and for attention to L side; pt demonstrate L inattention and difficulty navigating environment  Gait velocity: decreased    Posture / Balance Dynamic Sitting Balance Sitting balance - Comments: min guard for sitting balance Balance Overall balance assessment: Needs assistance Sitting-balance support: Feet supported, Single extremity supported Sitting balance-Leahy Scale: Fair Sitting balance - Comments: min guard for sitting balance Standing balance support: Single extremity supported, During functional activity Standing balance-Leahy Scale: Poor Standing balance comment: pt very scared about falling requiring modA x2 for safe standing    Special needs/care consideration BiPAP/CPAP no CPM no Continuous Drip IV no Dialysis no        Days n/a Life Vest no Oxygen no Special Bed no Trach Size no Wound Vac (area) no      Location n/a Skin skin tear to L hand                           Bowel mgmt: continent, last bm 7/30 Bladder mgmt: continent Diabetic mgmt: no Behavioral consideration no Chemo/radiation no   Previous Home Environment (from acute therapy documentation) Living Arrangements: Alone  Lives With: Alone Available Help at Discharge: Family, Available PRN/intermittently Type of Home: House Home Layout: One level Home Access: Stairs to enter Entrance Stairs-Rails: Right Entrance Stairs-Number of Steps: 2 Bathroom Shower/Tub: Chiropodist: Handicapped height  Discharge Living  Setting Plans for Discharge Living Setting: Patient's home Type of Home at Discharge: House Discharge Home Layout: One level Discharge Home Access: Stairs to enter Entrance Stairs-Rails: Right Entrance Stairs-Number of Steps: 2 Discharge Bathroom Shower/Tub: Tub/shower unit Discharge Bathroom Toilet: Handicapped height Discharge Bathroom Accessibility: Yes How Accessible: Accessible via walker Does the patient have any problems obtaining your medications?: No  Social/Family/Support Systems Anticipated Caregiver: son, Nicole Kindred, and sister, Judson Roch Anticipated Ambulance person Information: Nicole Kindred is main contact [phone] Ability/Limitations of Caregiver: supervision Caregiver Availability: 24/7 Discharge Plan Discussed with Primary Caregiver: Yes Is Caregiver In Agreement with Plan?: Yes Does Caregiver/Family have Issues with Lodging/Transportation while Pt is in Rehab?: No  Goals/Additional Needs Patient/Family Goal for Rehab: PT/OT/SLP supervision to mod I Expected length of stay: 5-7 days Dietary Needs: heart healthy, thin Equipment Needs: tbd Pt/Family Agrees to Admission and willing to participate: Yes Program Orientation Provided & Reviewed with Pt/Caregiver Including Roles  & Responsibilities: Yes  Decrease burden of Care through IP rehab admission: n/a  Possible need for SNF placement upon discharge: No.  Have discussed with pt and family on 7/29 and 7/30.  Her sister, Judson Roch, will be staying with her 24/7, and she has 3 adult children who will provide assist if needed.    Patient Condition: I have reviewed medical records from [facility], spoken with CM, and patient and daughter. I met with patient at the bedside and discussed with son, Nicole Kindred, via phone for inpatient rehabilitation assessment.  Patient will benefit from ongoing PT, OT and SLP, can actively participate in 3 hours of therapy a day 5 days of the week, and can make measurable gains during the admission.   Patient will also benefit from the coordinated team approach during an Inpatient Acute Rehabilitation admission.  The patient will receive intensive therapy as well as Rehabilitation physician, nursing, social worker, and care management interventions.  Due to safety, skin/wound care, medication administration, pain management and patient education the patient requires 24 hour a day rehabilitation nursing.  The patient is currently min with mobility and basic ADLs.  Discharge  setting and therapy post discharge at home with home health is anticipated.  Patient has agreed to participate in the Acute Inpatient Rehabilitation Program and will admit today.  Preadmission Screen Completed By:  Michel Santee, PT, DPT 10/31/2018 1:37 PM ______________________________________________________________________   Discussed status with Dr. Naaman Plummer on 10/31/18  at 1:37 PM  and received approval for admission today.  Admission Coordinator:  Michel Santee, PT, DPT time 1:37 PM  Sudie Grumbling 10/31/18    Assessment/Plan: Diagnosis: embolic CVA's 1. Does the need for close, 24 hr/day Medical supervision in concert with the patient's rehab needs make it unreasonable for this patient to be served in a less intensive setting? Yes 2. Co-Morbidities requiring supervision/potential complications: CM, HTN 3. Due to bladder management, bowel management, safety, skin/wound care, disease management, medication administration, pain management and patient education, does the patient require 24 hr/day rehab nursing? Yes 4. Does the patient require coordinated care of a physician, rehab nurse, PT (1-2 hrs/day, 5 days/week), OT (1-2 hrs/day, 5 days/week) and SLP (1-2 hrs/day, 5 days/week) to address physical and functional deficits in the context of the above medical diagnosis(es)? Yes Addressing deficits in the following areas: balance, endurance, locomotion, strength, transferring, bowel/bladder control, bathing, dressing, feeding,  grooming, toileting, cognition, speech and psychosocial support 5. Can the patient actively participate in an intensive therapy program of at least 3 hrs of therapy 5 days a week? Yes 6. The potential for patient to make measurable gains while on inpatient rehab is excellent 7. Anticipated functional outcomes upon discharge from inpatients are: modified independent and supervision PT, supervision OT, modified independent and supervision SLP 8. Estimated rehab length of stay to reach the above functional goals is: 8-12 days 9. Anticipated D/C setting: Home 10. Anticipated post D/C treatments: [place] therapy 11. Overall Rehab/Functional Prognosis: excellent  MD Signature: Meredith Staggers, MD, [place] [facility] 10/31/2018

## 2018-10-30 NOTE — H&P (Signed)
Physical Medicine and Rehabilitation Admission H&P    Chief Complaint  Patient presents with  . Functional deficits due to stroke    HPI:  Morgan Boyer is an 85 year old female with history of HTN, GERD, neuropathy (hands/feet), headaches in the past, depression who was admitted on 10/26/18 with sudden onset of left sided weakness. CTA head/neck negative for LVO and she received tPA. Follow up MRI brain done revealing acute/subacute non-hemorrhagic scattered cortical infarct in right frontal, high right parietal and some in posterior parietal and left occipital lobes felt to be due to central embolic source. Stroke felt to be embolic concerning for occult A fib per Dr. Erlinda Hong.   2 D echo showed EF 60-65 % with increase in LV wall, moderate dilation of LA, moderate MVR, moderate TR, atrial septal aneurysm with mobile density in LAA --question thrombus. TEE ordered for work but but reported difficulty with swallowing and tendency for food to get stuck. Due to history of esophageal stricture in past esophaogram done 7/30 and was negative for stricture.  She underwent TEE with loop placement today.  Patient with resultant gait disorder with left inattention and had deficits in higher level cognition. CIR recommended due to functional decline.    Review of Systems  Constitutional: Negative for chills and fever.  HENT: Positive for hearing loss.   Eyes: Negative for blurred vision and double vision.  Respiratory: Negative for cough and shortness of breath.   Cardiovascular: Negative for chest pain and palpitations.  Gastrointestinal: Positive for heartburn. Negative for constipation, nausea and vomiting.  Genitourinary: Negative for dysuria and urgency.  Musculoskeletal: Negative for falls, myalgias and neck pain.  Skin: Negative for rash.  Neurological: Negative for speech change and headaches.  Psychiatric/Behavioral: Negative for depression. The patient does not have insomnia.     Past  Medical History:  Diagnosis Date  . Abnormality of gait   . Adenomatous colon polyp   . Arthritis   . Back pain   . Cancer ([place])    skin cancers  . Depression   . Diverticulosis   . DJD (degenerative joint disease) of knee   . Dyslipidemia   . Esophageal reflux   . H/O hiatal hernia   . Heart murmur   . Herpes zoster    throat  . History of shingles    left throat 07/2007  . Hyperlipidemia   . Hypertension   . Memory changes 01/14/2013  . Neuromuscular disorder ([place])    peripheral neuropathy  . Nocturnal leg cramps 07/15/2014  . Peripheral neuropathy   . Polyneuropathy in other diseases classified elsewhere ([place]) 01/14/2013    Past Surgical History:  Procedure Laterality Date  . ABDOMINAL HYSTERECTOMY    . APPENDECTOMY    . BACK SURGERY  07/2017  . COLON SURGERY     2001 diverticulitis with infection ..drained surgically .  Marland Kitchen EYE SURGERY     cat ext bil   . JOINT REPLACEMENT     right  . KNEE ARTHROSCOPY  11/08/2011  . TONSILLECTOMY    . TOTAL KNEE ARTHROPLASTY  11/07/2011   Procedure: TOTAL KNEE ARTHROPLASTY;  Surgeon: Ninetta Lights, MD;  Location: [facility];  Service: Orthopedics;  Laterality: Left;  . TUBAL LIGATION    . WRIST SURGERY     for skin cancer    Family History  Problem Relation Age of Onset  . Congestive Heart Failure Mother   . Stroke Father 73  . Heart attack Brother  43  . Diabetes Brother     Social History:  Lives alone. Independent PTA--used rollator at nights. 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  Has family in town--daughter checks in on the weekends prior to [place]. Neighbor does floors--she eats frozen dinners. She reports that she has never smoked. She has never used smokeless tobacco. She reports that she does not drink alcohol or use drugs.    Allergies  Allergen Reactions  . Septra [Sulfamethoxazole-Trimethoprim] Hives  . Lotensin [Benazepril Hcl] Other (See Comments)    NOT KNOWN    Medications Prior to Admission  Medication  Sig Dispense Refill  . aspirin 81 MG tablet Take 81 mg by mouth daily.    Marland Kitchen b complex vitamins tablet Take 1 tablet by mouth daily.    . baclofen (LIORESAL) 10 MG tablet Take 1 tablet (10 mg total) by mouth at bedtime. 90 tablet 3  . Calcium Citrate (CITRACAL PO) Take 1 tablet by mouth 2 (two) times daily. + D3    . diclofenac sodium (VOLTAREN) 1 % GEL Apply 1 application topically 3 (three) times daily as needed (pain).     Marland Kitchen esomeprazole (NEXIUM) 20 MG capsule Take 20 mg by mouth daily.     . furosemide (LASIX) 20 MG tablet Take 20 mg by mouth daily as needed for fluid.   1  . gabapentin (NEURONTIN) 600 MG tablet Take 1 tablet (600 mg total) by mouth at bedtime. 90 tablet 3  . hydrocortisone 2.5 % cream Apply 1 application topically daily as needed (rash).   11  . ibuprofen (ADVIL,MOTRIN) 200 MG tablet Take 400 mg by mouth 3 (three) times daily as needed for headache or moderate pain.     Marland Kitchen lisinopril-hydrochlorothiazide (PRINZIDE,ZESTORETIC) 20-12.5 MG per tablet Take 1 tablet by mouth daily.     Vladimir Faster Glycol-Propyl Glycol (SYSTANE OP) Place 1 drop into both eyes 3 (three) times daily as needed (for dry eyes).     . simvastatin (ZOCOR) 20 MG tablet Take 20 mg by mouth every evening.    Cristino Martes Root 500 MG CAPS Take 1 capsule by mouth at bedtime as needed (for stress).       Drug Regimen Review  Drug regimen was reviewed and remains appropriate with no significant issues identified  Home: Home Living Family/patient expects to be discharged to:: Private residence Living Arrangements: Alone Available Help at Discharge: Family, Available PRN/intermittently Type of Home: House Home Access: Stairs to enter Technical brewer of Steps: 2 Entrance Stairs-Rails: Right Home Layout: One level Bathroom Shower/Tub: Chiropodist: Handicapped height Home Equipment: Environmental consultant - 4 wheels, Shower seat, Grab bars - toilet, Grab bars - tub/shower, Hand held shower head   Lives With: Alone   Functional History: Prior Function Level of Independence: Needs assistance Gait / Transfers Assistance Needed: uses rollator in the house ADL's / [place] Needed: family has been driving and doing grocery shopping since [place]:  Mobility: Bed Mobility Overal bed mobility: Needs Assistance Bed Mobility: Supine to Sit Supine to sit: Min assist General bed mobility comments: assist to elevate trunk into sitting and to maintain balance while moving hips to EOB Transfers Overall transfer level: Needs assistance Equipment used: 1 person hand held assist Transfers: Sit to/from Stand Sit to Stand: Mod assist Stand pivot transfers: Mod assist, +2 physical assistance General transfer comment: increased assist to power up and to gain balance today with initial stand from EOB; pt c/o increased back pain today compared  to yesterday Ambulation/Gait Ambulation/Gait assistance: Min assist, Mod assist Gait Distance (Feet): 100 Feet Assistive device: 1 person hand held assist(assist at trunk with gait belt) Gait Pattern/deviations: Step-to pattern, Decreased step length - left, Decreased weight shift to left, Decreased stride length General Gait Details: assist for balance/weight shifting; cues for increased bilat step lengths and for attention to L side; pt demonstrate L inattention and difficulty navigating environment  Gait velocity: decreased    ADL: ADL Overall ADL's : Needs assistance/impaired Eating/Feeding: Minimal assistance, Sitting Grooming: Set up, Minimal assistance, Sitting, Wash/dry face, Wash/dry hands Upper Body Bathing: Minimal assistance, Sitting Lower Body Bathing: Maximal assistance, +2 for physical assistance, +2 for safety/equipment, Sit to/from stand Upper Body Dressing : Moderate assistance, Sitting Lower Body Dressing: Maximal assistance, +2 for physical assistance, +2 for safety/equipment, Sit to/from stand Lower Body  Dressing Details (indicate cue type and reason): pt requires assist to don socks, once started over foot pt able to assist with donning sock over heel/ankle, but requires increased time/effort as pt only using RUE to complete task Toilet Transfer: Moderate assistance, +2 for physical assistance, +2 for safety/equipment, Stand-pivot, BSC Toilet Transfer Details (indicate cue type and reason): use of +2 HHA Toileting- Clothing Manipulation and Hygiene: Moderate assistance, +2 for physical assistance, +2 for safety/equipment, Sit to/from stand Toileting - Clothing Manipulation Details (indicate cue type and reason): +2 assist for standing balance; pt able to perform peri-care both anteriorly and posterior, assist provided to ensure thoroughness with posterior hygiene after BM Functional mobility during ADLs: Moderate assistance, +2 for physical assistance, +2 for safety/equipment(HHA; stand pivot transfer) General ADL Comments: pt presenting with cognitive impairments, L side weakness, decreased balance   Cognition: Cognition Overall Cognitive Status: Impaired/Different from baseline Arousal/Alertness: Awake/alert Orientation Level: Oriented X4 Attention: Sustained Sustained Attention: Appears intact Memory: (cont to assess next session) Awareness: Impaired Awareness Impairment: Anticipatory impairment Problem Solving: Impaired Safety/Judgment: (?) Cognition Arousal/Alertness: Awake/alert Behavior During Therapy: WFL for tasks assessed/performed Overall Cognitive Status: Impaired/Different from baseline Area of Impairment: Safety/judgement, Problem solving, Attention Current Attention Level: Selective Memory: Decreased short-term memory Following Commands: Follows one step commands with increased time Safety/Judgement: Decreased awareness of deficits Awareness: Intellectual Problem Solving: Slow processing, Requires verbal cues, Requires tactile cues General Comments: L side inattention    Blood pressure (!) 148/79, pulse 70, temperature 98.2 F (36.8 C), temperature source Oral, resp. rate 14, height 5\' 2"  (1.575 m), weight 68 kg, SpO2 99 %. Physical Exam  Nursing note and vitals reviewed. Constitutional: She is oriented to person, place, and time. She appears well-developed and well-nourished. No distress.  HENT:  Head: Normocephalic and atraumatic.  Eyes: Pupils are equal, round, and reactive to light.  Cardiovascular: Normal rate.  Murmur heard. Respiratory: No stridor.  GI: She exhibits no distension. There is no abdominal tenderness.  Musculoskeletal:        General: No edema.  Neurological: She is alert and oriented to person, place, and time.  Fairly alert. Follows one step commands with delay. Word finding deficits. Can provide name, place, date, reason with cues. LUE 2/5 prox to distal with some apraxia, decreased FMC, LLE 2+ to 3+/5.  RUE and RLE 3+ to 4+/5. Senses pain in all 4's.   Skin: Skin is warm.  Psychiatric: She has a normal mood and affect. Her behavior is normal.    Results for orders placed or performed during the hospital encounter of 10/26/18 (from the past 48 hour(s))  CBC     Status: Abnormal  Collection Time: 10/30/18  4:06 AM  Result Value Ref Range   WBC 9.4 4.0 - 10.5 K/uL   RBC 4.19 3.87 - 5.11 MIL/uL   Hemoglobin 12.0 12.0 - 15.0 g/dL   HCT 35.8 (L) 36.0 - 46.0 %   MCV 85.4 80.0 - 100.0 fL   MCH 28.6 26.0 - 34.0 pg   MCHC 33.5 30.0 - 36.0 g/dL   RDW 13.2 11.5 - 15.5 %   Platelets 197 150 - 400 K/uL   nRBC 0.0 0.0 - 0.2 %    Comment: Performed at [facility], [place] [address], [place], [place] [address]  Basic metabolic panel     Status: Abnormal   Collection Time: 10/30/18  4:06 AM  Result Value Ref Range   Sodium 138 135 - 145 mmol/L   Potassium 3.5 3.5 - 5.1 mmol/L   Chloride 105 98 - 111 mmol/L   CO2 25 22 - 32 mmol/L   Glucose, Bld 100 (H) 70 - 99 mg/dL   BUN 12 8 - 23 mg/dL   Creatinine, Ser 0.67 0.44 - 1.00  mg/dL   Calcium 8.5 (L) 8.9 - 10.3 mg/dL   GFR calc non Af Amer >60 >60 mL/min   GFR calc Af Amer >60 >60 mL/min   Anion gap 8 5 - 15    Comment: Performed at [facility], [place] [address], [place], [place] [address]  CBC     Status: Abnormal   Collection Time: 10/31/18  5:31 AM  Result Value Ref Range   WBC 8.6 4.0 - 10.5 K/uL   RBC 4.14 3.87 - 5.11 MIL/uL   Hemoglobin 11.8 (L) 12.0 - 15.0 g/dL   HCT 35.1 (L) 36.0 - 46.0 %   MCV 84.8 80.0 - 100.0 fL   MCH 28.5 26.0 - 34.0 pg   MCHC 33.6 30.0 - 36.0 g/dL   RDW 13.2 11.5 - 15.5 %   Platelets 206 150 - 400 K/uL   nRBC 0.0 0.0 - 0.2 %    Comment: Performed at [facility], [place] [address] [place], [place] [address]  Basic metabolic panel     Status: Abnormal   Collection Time: 10/31/18  5:31 AM  Result Value Ref Range   Sodium 139 135 - 145 mmol/L   Potassium 3.3 (L) 3.5 - 5.1 mmol/L   Chloride 105 98 - 111 mmol/L   CO2 26 22 - 32 mmol/L   Glucose, Bld 101 (H) 70 - 99 mg/dL   BUN 13 8 - 23 mg/dL   Creatinine, Ser 0.64 0.44 - 1.00 mg/dL   Calcium 8.5 (L) 8.9 - 10.3 mg/dL   GFR calc non Af Amer >60 >60 mL/min   GFR calc Af Amer >60 >60 mL/min   Anion gap 8 5 - 15    Comment: Performed at [facility], [place] [address] [place], [place] [address]   Dg Esophagus W Single Cm (sol Or Thin Ba)  Result Date: 10/30/2018 CLINICAL DATA:  Esophageal stricture. EXAM: ESOPHOGRAM/BARIUM SWALLOW TECHNIQUE: Single contrast examination was performed using thin barium. FLUOROSCOPY TIME:  Fluoroscopy Time:  1.8 minutes Radiation Exposure Index (if provided by the fluoroscopic device): 15.80 mGy Number of Acquired Spot Images: 2 COMPARISON:  CT angiography neck 04/27/2018 FINDINGS: A problem-oriented esophagram was performed to rule out stricture. Fluoroscopic evaluation demonstrates normal caliber of the esophagus. No evidence of significant fixed stricture. No large hiatal hernia. Normal esophageal motility was observed. No reflux was  observed during the examination. The patient swallowed a 13 mm barium tablet which freely passed into the stomach. The examination was performed as a single contrast study, which limited evaluation of the esophageal mucosa. IMPRESSION: Problem-oriented esophagram performed. No evidence of significant fixed esophageal stricture. Electronically Signed   By: Kellie Simmering   On: 10/30/2018 13:36       Medical Problem List and Plan: 1.  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  Antithrombotics: -DVT/anticoagulation:  Pharmaceutical: Lovenox  -antiplatelet therapy: DAPT 3. Pain Management: Tylenol prn  4. Mood: LCSW to follow for evaluation and support.   -antipsychotic agents: NA 5. Neuropsych: This patient is capable of making decisions on her own behalf. 6. Skin/Wound Care: Routine pressure relief measures.  7. Fluids/Electrolytes/Nutrition: Monitor I/O. Check lytes in am. 8. HTN: Monitor BP bid--continue amlodipine and lisinopril  9.  GERD: Continue PPI. 10. Neuropathy: Managed with use of gabapentin and baclofen at nights.  11. Hypokalemia: will supplement X 2 days.         Bary Leriche, PA-C 10/31/2018

## 2018-10-30 NOTE — Progress Notes (Signed)
Physical Therapy Cancellation Note   10/30/18 0818  PT Visit Information  Last PT Received On 10/30/18  Reason Eval/Treat Not Completed Patient at procedure or test/unavailable. Pt off unit. PT will continue to follow acutely.    Earney Navy, PTA Acute Rehabilitation Services Pager: [phone] Office: [phone]

## 2018-10-30 NOTE — Progress Notes (Signed)
Inpatient Rehab Admissions Coordinator:   I received notification from [facility] that pt was denied CIR admission. I was able to overturn this denial and pt is approved to admit to CIR Friday.  Will confirm pt is medically ready in AM.  I have let pt/family know.   Shann Medal, PT, DPT Admissions Coordinator [phone] 10/30/18  11:46 AM

## 2018-10-31 ENCOUNTER — Inpatient Hospital Stay (HOSPITAL_COMMUNITY)
Admission: RE | Admit: 2018-10-31 | Discharge: 2018-11-11 | DRG: 057 | Disposition: A | Discharge status: Home / Self Care | Payer: PPO | Source: Intra-hospital | Attending: Physical Medicine & Rehabilitation | Admitting: Physical Medicine & Rehabilitation

## 2018-10-31 ENCOUNTER — Other Ambulatory Visit: Payer: Self-pay

## 2018-10-31 ENCOUNTER — Encounter (HOSPITAL_COMMUNITY): Payer: Self-pay

## 2018-10-31 ENCOUNTER — Inpatient Hospital Stay (HOSPITAL_COMMUNITY): Payer: PPO

## 2018-10-31 ENCOUNTER — Encounter (HOSPITAL_COMMUNITY)
Admission: EM | Disposition: A | Discharge status: Rehabilitation Facility | Payer: Self-pay | Source: Home / Self Care | Attending: Neurology

## 2018-10-31 DIAGNOSIS — I63411 Cerebral infarction due to embolism of right middle cerebral artery: Secondary | ICD-10-CM | POA: Diagnosis not present

## 2018-10-31 DIAGNOSIS — Z7982 Long term (current) use of aspirin: Secondary | ICD-10-CM

## 2018-10-31 DIAGNOSIS — E46 Unspecified protein-calorie malnutrition: Secondary | ICD-10-CM | POA: Diagnosis not present

## 2018-10-31 DIAGNOSIS — Z888 Allergy status to other drugs, medicaments and biological substances status: Secondary | ICD-10-CM

## 2018-10-31 DIAGNOSIS — Z8249 Family history of ischemic heart disease and other diseases of the circulatory system: Secondary | ICD-10-CM | POA: Diagnosis not present

## 2018-10-31 DIAGNOSIS — Z79899 Other long term (current) drug therapy: Secondary | ICD-10-CM

## 2018-10-31 DIAGNOSIS — I340 Nonrheumatic mitral (valve) insufficiency: Secondary | ICD-10-CM | POA: Diagnosis present

## 2018-10-31 DIAGNOSIS — Z90710 Acquired absence of both cervix and uterus: Secondary | ICD-10-CM | POA: Diagnosis not present

## 2018-10-31 DIAGNOSIS — I639 Cerebral infarction, unspecified: Secondary | ICD-10-CM | POA: Diagnosis present

## 2018-10-31 DIAGNOSIS — D62 Acute posthemorrhagic anemia: Secondary | ICD-10-CM

## 2018-10-31 DIAGNOSIS — S61512A Laceration without foreign body of left wrist, initial encounter: Secondary | ICD-10-CM | POA: Diagnosis not present

## 2018-10-31 DIAGNOSIS — Z823 Family history of stroke: Secondary | ICD-10-CM

## 2018-10-31 DIAGNOSIS — F329 Major depressive disorder, single episode, unspecified: Secondary | ICD-10-CM | POA: Diagnosis not present

## 2018-10-31 DIAGNOSIS — Z96651 Presence of right artificial knee joint: Secondary | ICD-10-CM | POA: Diagnosis not present

## 2018-10-31 DIAGNOSIS — I63413 Cerebral infarction due to embolism of bilateral middle cerebral arteries: Secondary | ICD-10-CM

## 2018-10-31 DIAGNOSIS — I6389 Other cerebral infarction: Secondary | ICD-10-CM

## 2018-10-31 DIAGNOSIS — I10 Essential (primary) hypertension: Secondary | ICD-10-CM | POA: Diagnosis not present

## 2018-10-31 DIAGNOSIS — K219 Gastro-esophageal reflux disease without esophagitis: Secondary | ICD-10-CM | POA: Diagnosis present

## 2018-10-31 DIAGNOSIS — I6340 Cerebral infarction due to embolism of unspecified cerebral artery: Secondary | ICD-10-CM | POA: Diagnosis not present

## 2018-10-31 DIAGNOSIS — I69390 Apraxia following cerebral infarction: Secondary | ICD-10-CM

## 2018-10-31 DIAGNOSIS — I69354 Hemiplegia and hemiparesis following cerebral infarction affecting left non-dominant side: Secondary | ICD-10-CM | POA: Diagnosis not present

## 2018-10-31 DIAGNOSIS — E876 Hypokalemia: Secondary | ICD-10-CM

## 2018-10-31 DIAGNOSIS — I69320 Aphasia following cerebral infarction: Secondary | ICD-10-CM | POA: Diagnosis not present

## 2018-10-31 DIAGNOSIS — E8809 Other disorders of plasma-protein metabolism, not elsewhere classified: Secondary | ICD-10-CM

## 2018-10-31 DIAGNOSIS — E785 Hyperlipidemia, unspecified: Secondary | ICD-10-CM | POA: Diagnosis present

## 2018-10-31 DIAGNOSIS — E441 Mild protein-calorie malnutrition: Secondary | ICD-10-CM | POA: Diagnosis not present

## 2018-10-31 DIAGNOSIS — G629 Polyneuropathy, unspecified: Secondary | ICD-10-CM | POA: Diagnosis not present

## 2018-10-31 DIAGNOSIS — I69318 Other symptoms and signs involving cognitive functions following cerebral infarction: Secondary | ICD-10-CM | POA: Diagnosis not present

## 2018-10-31 DIAGNOSIS — Z85828 Personal history of other malignant neoplasm of skin: Secondary | ICD-10-CM | POA: Diagnosis not present

## 2018-10-31 HISTORY — PX: TEE WITHOUT CARDIOVERSION: SHX5443

## 2018-10-31 HISTORY — PX: LOOP RECORDER INSERTION: EP1214

## 2018-10-31 HISTORY — PX: BUBBLE STUDY: SHX6837

## 2018-10-31 LAB — BASIC METABOLIC PANEL
Anion gap: 8 (ref 5–15)
BUN: 13 mg/dL (ref 8–23)
CO2: 26 mmol/L (ref 22–32)
Calcium: 8.5 mg/dL — ABNORMAL LOW (ref 8.9–10.3)
Chloride: 105 mmol/L (ref 98–111)
Creatinine, Ser: 0.64 mg/dL (ref 0.44–1.00)
GFR calc Af Amer: >60 mL/min (ref >60)
GFR calc non Af Amer: >60 mL/min (ref >60)
Glucose, Bld: 101 mg/dL — ABNORMAL HIGH (ref 70–99)
Potassium: 3.3 mmol/L — ABNORMAL LOW (ref 3.5–5.1)
Sodium: 139 mmol/L (ref 135–145)

## 2018-10-31 LAB — CBC
HCT: 35.1 % — ABNORMAL LOW (ref 36.0–46.0)
Hemoglobin: 11.8 g/dL — ABNORMAL LOW (ref 12.0–15.0)
MCH: 28.5 pg (ref 26.0–34.0)
MCHC: 33.6 g/dL (ref 30.0–36.0)
MCV: 84.8 fL (ref 80.0–100.0)
Platelets: 206 10*3/uL (ref 150–400)
RBC: 4.14 MIL/uL (ref 3.87–5.11)
RDW: 13.2 % (ref 11.5–15.5)
WBC: 8.6 10*3/uL (ref 4.0–10.5)
nRBC: 0 % (ref 0.0–0.2)

## 2018-10-31 SURGERY — ECHOCARDIOGRAM, TRANSESOPHAGEAL
Anesthesia: Moderate Sedation

## 2018-10-31 SURGERY — LOOP RECORDER INSERTION

## 2018-10-31 MED ORDER — MUSCLE RUB 10-15 % EX CREA
TOPICAL_CREAM | CUTANEOUS | Status: DC | PRN
  Administered 2018-11-05: 1 via TOPICAL
  Filled 2018-10-31: qty 85

## 2018-10-31 MED ORDER — ALUM & MAG HYDROXIDE-SIMETH 200-200-20 MG/5ML PO SUSP: 30.0000 mL | ORAL | Status: DC | PRN

## 2018-10-31 MED ORDER — POLYETHYLENE GLYCOL 3350 17 G PO PACK
17.0000 g | PACK | Freq: Every day | ORAL | Status: DC | PRN
  Administered 2018-11-01: 17 g via ORAL
  Filled 2018-10-31: qty 1

## 2018-10-31 MED ORDER — PROCHLORPERAZINE MALEATE 5 MG PO TABS: 5.0000 mg | ORAL_TABLET | Freq: Four times a day (QID) | ORAL | Status: DC | PRN

## 2018-10-31 MED ORDER — ASPIRIN EC 81 MG PO TBEC
81.0000 mg | DELAYED_RELEASE_TABLET | Freq: Every day | ORAL | Status: DC
  Administered 2018-11-01 – 2018-11-11 (×11): 81 mg via ORAL
  Filled 2018-10-31 (×11): qty 1

## 2018-10-31 MED ORDER — PANTOPRAZOLE SODIUM 40 MG PO TBEC
40.0000 mg | DELAYED_RELEASE_TABLET | Freq: Every day | ORAL | Status: DC
  Administered 2018-11-01 – 2018-11-11 (×11): 40 mg via ORAL
  Filled 2018-10-31 (×11): qty 1

## 2018-10-31 MED ORDER — PROCHLORPERAZINE 25 MG RE SUPP
12.5000 mg | Freq: Four times a day (QID) | RECTAL | Status: DC | PRN
  Filled 2018-10-31: qty 1

## 2018-10-31 MED ORDER — ENOXAPARIN SODIUM 40 MG/0.4ML ~~LOC~~ SOLN
40.0000 mg | SUBCUTANEOUS | Status: DC
  Administered 2018-10-31 – 2018-11-10 (×9): 40 mg via SUBCUTANEOUS
  Filled 2018-10-31 (×11): qty 0.4

## 2018-10-31 MED ORDER — FLEET ENEMA 7-19 GM/118ML RE ENEM: 1.0000 | ENEMA | Freq: Once | RECTAL | Status: DC | PRN

## 2018-10-31 MED ORDER — B COMPLEX-C PO TABS
1.0000 | ORAL_TABLET | Freq: Every day | ORAL | Status: DC
  Administered 2018-11-01 – 2018-11-11 (×11): 1 via ORAL
  Filled 2018-10-31 (×11): qty 1

## 2018-10-31 MED ORDER — SIMVASTATIN 20 MG PO TABS
20.0000 mg | ORAL_TABLET | Freq: Every evening | ORAL | Status: DC
  Administered 2018-11-01 – 2018-11-10 (×10): 20 mg via ORAL
  Filled 2018-10-31 (×11): qty 1

## 2018-10-31 MED ORDER — MIDAZOLAM HCL (PF) 5 MG/ML IJ SOLN
INTRAMUSCULAR | Status: AC
  Filled 2018-10-31: qty 2

## 2018-10-31 MED ORDER — AMLODIPINE BESYLATE 10 MG PO TABS
10.0000 mg | ORAL_TABLET | Freq: Every day | ORAL | Status: DC
  Administered 2018-11-01 – 2018-11-11 (×10): 10 mg via ORAL
  Filled 2018-10-31 (×11): qty 1

## 2018-10-31 MED ORDER — LISINOPRIL 20 MG PO TABS
20.0000 mg | ORAL_TABLET | Freq: Two times a day (BID) | ORAL | Status: DC
  Administered 2018-10-31 – 2018-11-11 (×21): 20 mg via ORAL
  Filled 2018-10-31 (×22): qty 1

## 2018-10-31 MED ORDER — PROCHLORPERAZINE EDISYLATE 10 MG/2ML IJ SOLN: 5.0000 mg | Freq: Four times a day (QID) | INTRAMUSCULAR | Status: DC | PRN

## 2018-10-31 MED ORDER — BISACODYL 10 MG RE SUPP: 10.0000 mg | Freq: Every day | RECTAL | Status: DC | PRN

## 2018-10-31 MED ORDER — FENTANYL CITRATE (PF) 100 MCG/2ML IJ SOLN
INTRAMUSCULAR | Status: DC | PRN
  Administered 2018-10-31 (×2): 25 ug via INTRAVENOUS

## 2018-10-31 MED ORDER — MIDAZOLAM HCL (PF) 10 MG/2ML IJ SOLN
INTRAMUSCULAR | Status: DC | PRN
  Administered 2018-10-31 (×2): 2 mg via INTRAVENOUS

## 2018-10-31 MED ORDER — GUAIFENESIN-DM 100-10 MG/5ML PO SYRP: 5.0000 mL | ORAL_SOLUTION | Freq: Four times a day (QID) | ORAL | Status: DC | PRN

## 2018-10-31 MED ORDER — GABAPENTIN 300 MG PO CAPS
600.0000 mg | ORAL_CAPSULE | Freq: Every day | ORAL | Status: DC
  Administered 2018-10-31 – 2018-11-10 (×11): 600 mg via ORAL
  Filled 2018-10-31 (×11): qty 2

## 2018-10-31 MED ORDER — POTASSIUM CHLORIDE CRYS ER 20 MEQ PO TBCR
20.0000 meq | EXTENDED_RELEASE_TABLET | Freq: Two times a day (BID) | ORAL | Status: AC
  Administered 2018-10-31 – 2018-11-02 (×4): 20 meq via ORAL
  Filled 2018-10-31 (×4): qty 1

## 2018-10-31 MED ORDER — BUTAMBEN-TETRACAINE-BENZOCAINE 2-2-14 % EX AERO
INHALATION_SPRAY | CUTANEOUS | Status: DC | PRN
  Administered 2018-10-31: 2 via TOPICAL

## 2018-10-31 MED ORDER — CLOPIDOGREL BISULFATE 75 MG PO TABS
75.0000 mg | ORAL_TABLET | Freq: Every day | ORAL | Status: DC
  Administered 2018-11-01 – 2018-11-11 (×11): 75 mg via ORAL
  Filled 2018-10-31 (×11): qty 1

## 2018-10-31 MED ORDER — CALCIUM CARBONATE ANTACID 500 MG PO CHEW: 1.0000 | CHEWABLE_TABLET | Freq: Every day | ORAL | Status: DC | PRN

## 2018-10-31 MED ORDER — BACLOFEN 10 MG PO TABS
10.0000 mg | ORAL_TABLET | Freq: Every day | ORAL | Status: DC
  Administered 2018-10-31 – 2018-11-10 (×11): 10 mg via ORAL
  Filled 2018-10-31 (×11): qty 1

## 2018-10-31 MED ORDER — FENTANYL CITRATE (PF) 100 MCG/2ML IJ SOLN
INTRAMUSCULAR | Status: AC
  Filled 2018-10-31: qty 2

## 2018-10-31 MED ORDER — SODIUM CHLORIDE 0.9 % IV SOLN
INTRAVENOUS | Status: AC | PRN
  Administered 2018-10-31: 500 mL via INTRAMUSCULAR

## 2018-10-31 MED ORDER — ACETAMINOPHEN 325 MG PO TABS
325.0000 mg | ORAL_TABLET | ORAL | Status: DC | PRN
  Administered 2018-10-31 – 2018-11-10 (×11): 650 mg via ORAL
  Filled 2018-10-31 (×11): qty 2

## 2018-10-31 MED ORDER — DIPHENHYDRAMINE HCL 12.5 MG/5ML PO ELIX: 12.5000 mg | ORAL_SOLUTION | Freq: Four times a day (QID) | ORAL | Status: DC | PRN

## 2018-10-31 MED ORDER — TRAZODONE HCL 50 MG PO TABS
25.0000 mg | ORAL_TABLET | Freq: Every evening | ORAL | Status: DC | PRN
  Administered 2018-10-31 – 2018-11-03 (×2): 25 mg via ORAL
  Administered 2018-11-06: 50 mg via ORAL
  Filled 2018-10-31 (×3): qty 1

## 2018-10-31 MED ORDER — LIDOCAINE-EPINEPHRINE 1 %-1:100000 IJ SOLN
INTRAMUSCULAR | Status: AC
  Filled 2018-10-31: qty 1

## 2018-10-31 SURGICAL SUPPLY — 2 items
LOOP REVEAL LINQSYS (Prosthesis & Implant Heart) ×2 IMPLANT
PACK LOOP INSERTION (CUSTOM PROCEDURE TRAY) ×3 IMPLANT

## 2018-10-31 NOTE — Plan of Care (Signed)
Patient stable, discussed POC with patient, agreeable with plan, denies question/concerns at this time.  

## 2018-10-31 NOTE — Progress Notes (Signed)
Pt arrived via bed. Alert and oriented times four. Speech is clear. Denies any pain. Pt has triangular shaped skin tear on right hand. Loop recorder set up and synched. Pt educated to rationale and use of loop recorder. Dressing to op site is clean dry and intact. Pt has small pale bruised area distal coccyx. Pt educated to rehab routine policies and procedures. Pt taken to bathroom via w/c one assist. Pt was able to do her peri care independently. Pt states " I've always done everything by myself and I want to keep being able to do things for myself." Pt back to bed with bed alarm on 4 sider rails up per pt request. Call light in reach. Floor mats placed for safety.

## 2018-10-31 NOTE — Discharge Summary (Addendum)
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 Abnormality of gait   . Adenomatous colon polyp   . Arthritis   . Back pain   . Cancer ([place])    skin cancers  . Depression   . Diverticulosis   . DJD (degenerative joint disease) of knee   . Dyslipidemia   . Esophageal reflux   . H/O hiatal hernia   . Heart murmur   . Herpes zoster    throat  . History of shingles    left throat 07/2007  . Hyperlipidemia   . Hypertension   . Memory changes 01/14/2013  . Neuromuscular disorder ([place])    peripheral neuropathy  . Nocturnal leg cramps 07/15/2014  . Peripheral neuropathy   . Polyneuropathy in other diseases classified elsewhere ([place]) 01/14/2013   Past Surgical History:  Procedure Laterality Date  . ABDOMINAL HYSTERECTOMY    . APPENDECTOMY    . BACK SURGERY  07/2017  . COLON SURGERY     2001 diverticulitis with infection ..drained surgically .  Marland Kitchen EYE SURGERY     cat ext bil   . JOINT REPLACEMENT     right  . KNEE ARTHROSCOPY  11/08/2011  . TONSILLECTOMY    . TOTAL KNEE ARTHROPLASTY  11/07/2011   Procedure: TOTAL KNEE ARTHROPLASTY;  Surgeon: Ninetta Lights, MD;  Location: [place];  Service: Orthopedics;  Laterality: Left;  . TUBAL LIGATION    . WRIST SURGERY     for skin cancer    Medications to be continued on Rehab: continue current medication regimen  Scheduled Meds: .  amLODipine  10 mg Oral Daily  . aspirin EC  81 mg Oral Daily  . B-complex with vitamin C  1 tablet Oral Daily  . baclofen  10 mg Oral QHS  . clopidogrel  75 mg Oral Daily  . gabapentin  600 mg Oral QHS  . lisinopril  20 mg Oral BID  . pantoprazole  40 mg Oral Daily  . simvastatin  20 mg Oral QPM   LABORATORY STUDIES CBC    Component Value Date/Time   WBC 8.6 10/31/2018 0531   RBC 4.14 10/31/2018 0531   HGB 11.8 (L) 10/31/2018 0531   HCT 35.1 (L) 10/31/2018 0531   PLT 206 10/31/2018 0531   MCV 84.8 10/31/2018 0531   MCH 28.5 10/31/2018 0531   MCHC 33.6 10/31/2018 0531   RDW 13.2 10/31/2018 0531   LYMPHSABS 1.6 10/26/2018 0146   MONOABS 0.5 10/26/2018 0146   EOSABS 0.1 10/26/2018 0146   BASOSABS 0.0 10/26/2018 0146   CMP    Component Value Date/Time   NA 139 10/31/2018 0531   K 3.3 (L) 10/31/2018 0531   CL 105 10/31/2018 0531   CO2 26 10/31/2018 0531   GLUCOSE 101 (H) 10/31/2018 0531   BUN 13 10/31/2018 0531   CREATININE 0.64 10/31/2018 0531  CALCIUM 8.5 (L) 10/31/2018 0531   PROT 6.3 (L) 10/26/2018 0238   ALBUMIN 3.7 10/26/2018 0238   AST 27 10/26/2018 0238   ALT 17 10/26/2018 0238   ALKPHOS 46 10/26/2018 0238   BILITOT 0.8 10/26/2018 0238   GFRNONAA >60 10/31/2018 0531   GFRAA >60 10/31/2018 0531   COAGS Lab Results  Component Value Date   INR 1.2 10/26/2018   INR 1.50 (H) 11/10/2011   INR 1.27 11/09/2011   Lipid Panel    Component Value Date/Time   CHOL 149 10/26/2018 0541   TRIG 52 10/26/2018 0541   HDL 55 10/26/2018 0541   CHOLHDL 2.7 10/26/2018 0541   VLDL 10 10/26/2018 0541   LDLCALC 84 10/26/2018 0541   HgbA1C  Lab Results  Component Value Date   HGBA1C 5.8 (H) 10/26/2018   Urinalysis    Component Value Date/Time   COLORURINE YELLOW 10/28/2018 1644   APPEARANCEUR HAZY (A) 10/28/2018 1644   LABSPEC 1.014 10/28/2018 1644   PHURINE 6.0 10/28/2018 1644   GLUCOSEU NEGATIVE 10/28/2018 1644   HGBUR NEGATIVE 10/28/2018 1644   BILIRUBINUR  NEGATIVE 10/28/2018 1644   KETONESUR 20 (A) 10/28/2018 1644   PROTEINUR NEGATIVE 10/28/2018 1644   UROBILINOGEN 0.2 10/31/2011 1139   NITRITE NEGATIVE 10/28/2018 1644   LEUKOCYTESUR LARGE (A) 10/28/2018 1644   Urine Drug Screen     Component Value Date/Time   LABOPIA NONE DETECTED 10/28/2018 1644   COCAINSCRNUR NONE DETECTED 10/28/2018 1644   LABBENZ NONE DETECTED 10/28/2018 1644   AMPHETMU NONE DETECTED 10/28/2018 1644   THCU NONE DETECTED 10/28/2018 1644   LABBARB NONE DETECTED 10/28/2018 1644    Alcohol Level    Component Value Date/Time   ETH <10 10/26/2018 0146   SIGNIFICANT DIAGNOSTIC STUDIES Ct Head Code Stroke Wo Contrast 10/26/2018 1. No acute hemorrhage.  2. ASPECTS is 10.   Ct Angio Head W Or Wo Contrast Ct Angio Neck W Or Wo Contrast 10/26/2018 1. No emergent large vessel occlusion or hemodynamically significant stenosis.  2. Mild bilateral carotid bifurcation atherosclerosis with less than 50% stenosis.  3. Aortic atherosclerosis (ICD10-I70.0).  Mr Brain Wo Contrast 10/27/2018 1. Acute/subacute nonhemorrhagic scattered cortical infarcts involving the posterior right frontal lobe consistent with the patient's left upper extremity weakness. 2. Acute/subacute scattered nonhemorrhagic cortical infarcts are present in the high right parietal lobe. 3. Acute/subacute nonhemorrhagic cortical infarcts in the more inferior posterior parietal lobes and left occipital lobe. 4. Scattered distribution suggests a central embolic source. 5. Mild sinus disease. 6. Grade 1 anterolisthesis at C3-4.  2D Echocardiogram 10/27/2018 LVEF 60-65%, moderate LVH, normal wall motion, grade 2 DD, elevated LV filling pressure, MAC with moderate MR, moderate LAE, moderate TR, RVSP 72 mmHg (moderate to severe pulmonary hypertension), IVC non-dilated but does not collapse, there is an atrial septal aneurysm (cannot exclude PFO) - although not well-visualized, there appears to be a mobile density  in the left atrium in the area of the appendage that may be thrombus in the left atrial appendage (seen in the parasternal long-axis view). Recommend TEE in the setting of stroke to better visualize LAA and interatrial septum. I was asked to read this study on 7/28 as it was discovered this morning to be unread over the weekend (performed on 7/26).  TEE  10/30/2018 Pending  Dg Esophagus W Single Cm (sol Or Thin Ba) Result Date: 10/30/2018 CLINICAL DATA:  Esophageal stricture. EXAM: ESOPHOGRAM/BARIUM SWALLOW TECHNIQUE: Single contrast examination was performed using thin barium. FLUOROSCOPY TIME:  Fluoroscopy Time:  1.8 minutes Radiation Exposure Index (if provided by the fluoroscopic device): 15.80 mGy Number of Acquired Spot Images: 2 COMPARISON:  CT angiography neck 04/27/2018 FINDINGS: A problem-oriented esophagram was performed to rule out stricture. Fluoroscopic evaluation demonstrates normal caliber of the esophagus. No evidence of significant fixed stricture. No large hiatal hernia. Normal esophageal motility was observed. No reflux was observed during the examination. The patient swallowed a 13 mm barium tablet which freely passed into the stomach. The examination was performed as a single contrast study, which limited evaluation of the esophageal mucosa. IMPRESSION: Problem-oriented esophagram performed. No evidence of significant fixed esophageal stricture. Electronically Signed   By: Kellie Simmering   On: 10/30/2018 13:36     HOSPITAL COURSE 85 year old female with PMH HTN, GERD, hx of esophageal stricture, neuropathy, depression, and headaches who presented as a code stroke with acute left-sided weakness s/p tPA on 7/26 with residual LUE weakness. Stroke work up significant for R MCA and L posterior MCA infarcts suggestive of cardioembolic pattern with concern for occult afib. Echo was concerning for LAA thrombus +/- PFO and patient is awaiting to undergo TEE + potential loop recorder placement  today, esophaogram 7/30 negative for stricture. Patient to be transferred to CIR after for on-going rehab for concerns of gait disorder, left inattentions, and defecits in higher level cognition.   DISCHARGE EXAM Blood pressure (!) 159/75, pulse 73, temperature (!) 97.5 F (36.4 C), temperature source Oral, resp. rate 19, height _0  (1.575 m), weight 68 kg, SpO2 96 %.  GEN: NAD, pleasant, cooperative CVS: RRR, no carotid bruit CHEST: No signs of resp distress, on room air ABD: Soft, NTTP  NEURO:  MENTAL STATUS: AAOx3  LANG/SPEECH: Fluent, intact naming, repetition & comprehension   CRANIAL NERVES:  II: Pupils equal and reactive, no RAPD, normal visual field and fundus  III, IV, VI: EOM intact, no gaze preference or deviation  V: normal  VII: no facial asymmetry  VIII: normal hearing to speech  MOTOR: 5/5 RUE, LUE 3/5 REFLEXES: 2/4 throughout, bilateral flexor planters  SENSORY: Normal to touch, temperature & pin prick in all extremiteis  COORD: normal movements of R hand w/o ataxia or dysmetria   Discharge Diet   Diet Order            Diet NPO time specified  Diet effective now              DISCHARGE PLAN  Disposition:  Transfer to [facility] for ongoing PT, OT and ST  aspirin 81 mg daily and clopidogrel 75 mg daily for secondary stroke prevention for 3 weeks then plavix alone.  Recommend ongoing stroke risk factor control by Primary Care Physician at time of discharge from inpatient rehabilitation.  Follow-up Leighton Ruff, MD in 2 weeks following discharge from rehab.  Follow-up in [facility] in 4 weeks following discharge from rehab, office to schedule an appointment.   30 minutes were spent preparing discharge.  Posey Pronto PA-C [facility] [phone]  ATTENDING NOTE: I reviewed above note and agree with the assessment and plan. Pt was seen and examined.   Stroke: right MCA and  left posterior MCA infarcts - cardioembolic pattern - highly concerning for occult afib  Resultant Left UE weakness  CT head  - no acute findings.  CTA H&N - Mild bilateral carotid bifurcation atherosclerosis with less than 50% stenosis.   MRI right MCA patchy infarct and left posterior MCA infarct  2D Echo - EF 60-65%.  Concern for LAA thrombus, cannot rule out PFO.  TEE severe MR but no LA or LAA thrombus  loop recorder placed to look for AF as source of stroke  [employer] Virus 2  - negative  LDL - 84    HgbA1c - 5.8  VTE prophylaxis - SCDs  aspirin 81 mg daily prior to admission, now on DAPT with ASA 81 and plavix 75. Continue DAPT for 3 weeks and then plavix alone  Therapy recommendations:  CIR  Disposition: CIR today  Hypertension  Resume home meds with lisinopril and HCTZ  Blood pressure improved on home meds  Long-term BP goal normotensive  Hyperlipidemia  Lipid lowering medication PTA:  Zocor 20 mg daily  LDL 84, goal < 70  Current lipid lowering medication:  Zocor 20 mg daily  Continue statin at discharge  Thrombocytopenia   Platelet 64-> 198-180-179-197->206, baseline 150-200  Continue to monitor  ASA 81 at home  On DAPT now  Other Stroke Risk Factors  Advanced age  Family hx stroke (father)  Other Active Problems  Hypokalemia, resolved - 3.8->3.4 - supplement - 4.1->3.5->3.3  L hand edema w/ skin tear - WOC consulted  Esophageal stricture requiring dilatation in past. BA swallow no significant stricture  I spent 35 min on discharging this pt. She will follow up with stroke neurology as outpt in 4 weeks.   Rosalin Hawking, MD PhD Stroke Neurology 10/31/2018 5:52 PM

## 2018-10-31 NOTE — Discharge Instructions (Signed)
Implant site care instructions Keep incision clean and dry for 10 days. You can remove outer dressing tomorrow. Leave steri-strips (little pieces of tape) on until seen in the office for wound check appointment. Call the office [phone]) for redness, drainage, swelling, or fever.

## 2018-10-31 NOTE — Interval H&P Note (Signed)
History and Physical Interval Note:  10/31/2018 12:19 PM  Morgan Boyer  has presented today for surgery, with the diagnosis of stroke.  The various methods of treatment have been discussed with the patient and family. After consideration of risks, benefits and other options for treatment, the patient has consented to  Procedure(s): TRANSESOPHAGEAL ECHOCARDIOGRAM (TEE) (N/A) as a surgical intervention.  The patient's history has been reviewed, patient examined, no change in status, stable for surgery.  I have reviewed the patient's chart and labs.  Questions were answered to the patient's satisfaction.     Dorris Carnes

## 2018-10-31 NOTE — H&P (Signed)
Physical Medicine and Rehabilitation Admission H&P        Chief Complaint  Patient presents with  . Functional deficits due to stroke      HPI:  Morgan Boyer is an 85 year old female with history of HTN, GERD, neuropathy (hands/feet), headaches in the past, depression who was admitted on 10/26/18 with sudden onset of left sided weakness. CTA head/neck negative for LVO and she received tPA. Follow up MRI brain done revealing acute/subacute non-hemorrhagic scattered cortical infarct in right frontal, high right parietal and some in posterior parietal and left occipital lobes felt to be due to central embolic source. Stroke felt to be embolic concerning for occult A fib per Dr. Erlinda Hong.   2 D echo showed EF 60-65 % with increase in LV wall, moderate dilation of LA, moderate MVR, moderate TR, atrial septal aneurysm with mobile density in LAA --question thrombus. TEE ordered for work but but reported difficulty with swallowing and tendency for food to get stuck. Due to history of esophageal stricture in past esophaogram done 7/30 and was negative for stricture.  She underwent TEE with loop placement today.  Patient with resultant gait disorder with left inattention and had deficits in higher level cognition. CIR recommended due to functional decline.      Review of Systems  Constitutional: Negative for chills and fever.  HENT: Positive for hearing loss.   Eyes: Negative for blurred vision and double vision.  Respiratory: Negative for cough and shortness of breath.   Cardiovascular: Negative for chest pain and palpitations.  Gastrointestinal: Positive for heartburn. Negative for constipation, nausea and vomiting.  Genitourinary: Negative for dysuria and urgency.  Musculoskeletal: Negative for falls, myalgias and neck pain.  Skin: Negative for rash.  Neurological: Negative for speech change and headaches.  Psychiatric/Behavioral: Negative for depression. The patient does not have insomnia.            Past Medical History:  Diagnosis Date  . Abnormality of gait    . Adenomatous colon polyp    . Arthritis    . Back pain    . Cancer ([place])      skin cancers  . Depression    . Diverticulosis    . DJD (degenerative joint disease) of knee    . Dyslipidemia    . Esophageal reflux    . H/O hiatal hernia    . Heart murmur    . Herpes zoster      throat  . History of shingles      left throat 07/2007  . Hyperlipidemia    . Hypertension    . Memory changes 01/14/2013  . Neuromuscular disorder ([place])      peripheral neuropathy  . Nocturnal leg cramps 07/15/2014  . Peripheral neuropathy    . Polyneuropathy in other diseases classified elsewhere ([place]) 01/14/2013          Past Surgical History:  Procedure Laterality Date  . ABDOMINAL HYSTERECTOMY      . APPENDECTOMY      . BACK SURGERY   07/2017  . COLON SURGERY        2001 diverticulitis with infection ..drained surgically .  Marland Kitchen EYE SURGERY        cat ext bil   . JOINT REPLACEMENT        right  . KNEE ARTHROSCOPY   11/08/2011  . TONSILLECTOMY      . TOTAL KNEE ARTHROPLASTY   11/07/2011    Procedure: TOTAL  KNEE ARTHROPLASTY;  Surgeon: Ninetta Lights, MD;  Location: [facility];  Service: Orthopedics;  Laterality: Left;  . TUBAL LIGATION      . WRIST SURGERY        for skin cancer          Family History  Problem Relation Age of Onset  . Congestive Heart Failure Mother    . Stroke Father 61  . Heart attack Brother 54  . Diabetes Brother       Social History:  Lives alone. Independent PTA--used rollator at nights. 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  Has family in town--daughter checks in on the weekends prior to [place]. Neighbor does floors--she eats frozen dinners. She reports that she has never smoked. She has never used smokeless tobacco. She reports that she does not drink alcohol or use drugs.           Allergies  Allergen Reactions  . Septra [Sulfamethoxazole-Trimethoprim] Hives  . Lotensin [Benazepril Hcl]  Other (See Comments)      NOT KNOWN           Medications Prior to Admission  Medication Sig Dispense Refill  . aspirin 81 MG tablet Take 81 mg by mouth daily.      Marland Kitchen b complex vitamins tablet Take 1 tablet by mouth daily.      . baclofen (LIORESAL) 10 MG tablet Take 1 tablet (10 mg total) by mouth at bedtime. 90 tablet 3  . Calcium Citrate (CITRACAL PO) Take 1 tablet by mouth 2 (two) times daily. + D3      . diclofenac sodium (VOLTAREN) 1 % GEL Apply 1 application topically 3 (three) times daily as needed (pain).       Marland Kitchen esomeprazole (NEXIUM) 20 MG capsule Take 20 mg by mouth daily.       . furosemide (LASIX) 20 MG tablet Take 20 mg by mouth daily as needed for fluid.    1  . gabapentin (NEURONTIN) 600 MG tablet Take 1 tablet (600 mg total) by mouth at bedtime. 90 tablet 3  . hydrocortisone 2.5 % cream Apply 1 application topically daily as needed (rash).    11  . ibuprofen (ADVIL,MOTRIN) 200 MG tablet Take 400 mg by mouth 3 (three) times daily as needed for headache or moderate pain.       Marland Kitchen lisinopril-hydrochlorothiazide (PRINZIDE,ZESTORETIC) 20-12.5 MG per tablet Take 1 tablet by mouth daily.       Vladimir Faster Glycol-Propyl Glycol (SYSTANE OP) Place 1 drop into both eyes 3 (three) times daily as needed (for dry eyes).       . simvastatin (ZOCOR) 20 MG tablet Take 20 mg by mouth every evening.      Cristino Martes Root 500 MG CAPS Take 1 capsule by mouth at bedtime as needed (for stress).           Drug Regimen Review  Drug regimen was reviewed and remains appropriate with no significant issues identified   Home: Home Living Family/patient expects to be discharged to:: Private residence Living Arrangements: Alone Available Help at Discharge: Family, Available PRN/intermittently Type of Home: House Home Access: Stairs to enter [employer] of Steps: 2 Entrance Stairs-Rails: Right Home Layout: One level Bathroom Shower/Tub: Chiropodist: Handicapped  height Home Equipment: Environmental consultant - 4 wheels, Shower seat, Grab bars - toilet, Grab bars - tub/shower, Hand held shower head  Lives With: Alone   Functional History: Prior Function Level of Independence: Needs assistance Gait / Transfers  Assistance Needed: uses rollator in the house ADL's / Homemaking Assistance Needed: family has been driving and doing grocery shopping since [place] Status:  Mobility: Bed Mobility Overal bed mobility: Needs Assistance Bed Mobility: Supine to Sit Supine to sit: Min assist General bed mobility comments: assist to elevate trunk into sitting and to maintain balance while moving hips to EOB Transfers Overall transfer level: Needs assistance Equipment used: 1 person hand held assist Transfers: Sit to/from Stand Sit to Stand: Mod assist Stand pivot transfers: Mod assist, +2 physical assistance General transfer comment: increased assist to power up and to gain balance today with initial stand from EOB; pt c/o increased back pain today compared to yesterday Ambulation/Gait Ambulation/Gait assistance: Min assist, Mod assist Gait Distance (Feet): 100 Feet Assistive device: 1 person hand held assist(assist at trunk with gait belt) Gait Pattern/deviations: Step-to pattern, Decreased step length - left, Decreased weight shift to left, Decreased stride length General Gait Details: assist for balance/weight shifting; cues for increased bilat step lengths and for attention to L side; pt demonstrate L inattention and difficulty navigating environment  Gait velocity: decreased     ADL: ADL Overall ADL's : Needs assistance/impaired Eating/Feeding: Minimal assistance, Sitting Grooming: Set up, Minimal assistance, Sitting, Wash/dry face, Wash/dry hands Upper Body Bathing: Minimal assistance, Sitting Lower Body Bathing: Maximal assistance, +2 for physical assistance, +2 for safety/equipment, Sit to/from stand Upper Body Dressing : Moderate assistance,  Sitting Lower Body Dressing: Maximal assistance, +2 for physical assistance, +2 for safety/equipment, Sit to/from stand Lower Body Dressing Details (indicate cue type and reason): pt requires assist to don socks, once started over foot pt able to assist with donning sock over heel/ankle, but requires increased time/effort as pt only using RUE to complete task Toilet Transfer: Moderate assistance, +2 for physical assistance, +2 for safety/equipment, Stand-pivot, BSC Toilet Transfer Details (indicate cue type and reason): use of +2 HHA Toileting- Clothing Manipulation and Hygiene: Moderate assistance, +2 for physical assistance, +2 for safety/equipment, Sit to/from stand Toileting - Clothing Manipulation Details (indicate cue type and reason): +2 assist for standing balance; pt able to perform peri-care both anteriorly and posterior, assist provided to ensure thoroughness with posterior hygiene after BM Functional mobility during ADLs: Moderate assistance, +2 for physical assistance, +2 for safety/equipment(HHA; stand pivot transfer) General ADL Comments: pt presenting with cognitive impairments, L side weakness, decreased balance    Cognition: Cognition Overall Cognitive Status: Impaired/Different from baseline Arousal/Alertness: Awake/alert Orientation Level: Oriented X4 Attention: Sustained Sustained Attention: Appears intact Memory: (cont to assess next session) Awareness: Impaired Awareness Impairment: Anticipatory impairment Problem Solving: Impaired Safety/Judgment: (?) Cognition Arousal/Alertness: Awake/alert Behavior During Therapy: WFL for tasks assessed/performed Overall Cognitive Status: Impaired/Different from baseline Area of Impairment: Safety/judgement, Problem solving, Attention Current Attention Level: Selective Memory: Decreased short-term memory Following Commands: Follows one step commands with increased time Safety/Judgement: Decreased awareness of deficits  Awareness: Intellectual Problem Solving: Slow processing, Requires verbal cues, Requires tactile cues General Comments: L side inattention     Blood pressure (!) 148/79, pulse 70, temperature 98.2 F (36.8 C), temperature source Oral, resp. rate 14, height 5\' 2"  (1.575 m), weight 68 kg, SpO2 99 %. Physical Exam  Nursing note and vitals reviewed. Constitutional: She is oriented to person, place, and time. She appears well-developed and well-nourished. No distress.  HENT:  Head: Normocephalic and atraumatic.  Eyes: Pupils are equal, round, and reactive to light.  Cardiovascular: Normal rate.  Murmur heard. Respiratory: No stridor.  GI: She exhibits no distension. There  is no abdominal tenderness.  Musculoskeletal:        General: No edema.  Neurological: She is alert and oriented to person, place, and time.  Fairly alert. Follows one step commands with delay. Word finding deficits. Can provide name, place, date, reason with cues. LUE 2/5 prox to distal with some apraxia, decreased FMC, LLE 2+ to 3+/5.  RUE and RLE 3+ to 4+/5. Senses pain in all 4's.   Skin: Skin is warm.  Psychiatric: She has a normal mood and affect. Her behavior is normal.      Lab Results Last 48 Hours        Results for orders placed or performed during the hospital encounter of 10/26/18 (from the past 48 hour(s))  CBC     Status: Abnormal    Collection Time: 10/30/18  4:06 AM  Result Value Ref Range    WBC 9.4 4.0 - 10.5 K/uL    RBC 4.19 3.87 - 5.11 MIL/uL    Hemoglobin 12.0 12.0 - 15.0 g/dL    HCT 35.8 (L) 36.0 - 46.0 %    MCV 85.4 80.0 - 100.0 fL    MCH 28.6 26.0 - 34.0 pg    MCHC 33.5 30.0 - 36.0 g/dL    RDW 13.2 11.5 - 15.5 %    Platelets 197 150 - 400 K/uL    nRBC 0.0 0.0 - 0.2 %      Comment: Performed at [facility], [place] [address] [place], [place] [address]  Basic metabolic panel     Status: Abnormal    Collection Time: 10/30/18  4:06 AM  Result Value Ref Range    Sodium 138 135 - 145  mmol/L    Potassium 3.5 3.5 - 5.1 mmol/L    Chloride 105 98 - 111 mmol/L    CO2 25 22 - 32 mmol/L    Glucose, Bld 100 (H) 70 - 99 mg/dL    BUN 12 8 - 23 mg/dL    Creatinine, Ser 0.67 0.44 - 1.00 mg/dL    Calcium 8.5 (L) 8.9 - 10.3 mg/dL    GFR calc non Af Amer >60 >60 mL/min    GFR calc Af Amer >60 >60 mL/min    Anion gap 8 5 - 15      Comment: Performed at [facility], [place] [address], [place], [place] [address]  CBC     Status: Abnormal    Collection Time: 10/31/18  5:31 AM  Result Value Ref Range    WBC 8.6 4.0 - 10.5 K/uL    RBC 4.14 3.87 - 5.11 MIL/uL    Hemoglobin 11.8 (L) 12.0 - 15.0 g/dL    HCT 35.1 (L) 36.0 - 46.0 %    MCV 84.8 80.0 - 100.0 fL    MCH 28.5 26.0 - 34.0 pg    MCHC 33.6 30.0 - 36.0 g/dL    RDW 13.2 11.5 - 15.5 %    Platelets 206 150 - 400 K/uL    nRBC 0.0 0.0 - 0.2 %      Comment: Performed at [facility], [place] [address], [place], [place] [address]  Basic metabolic panel     Status: Abnormal    Collection Time: 10/31/18  5:31 AM  Result Value Ref Range    Sodium 139 135 - 145 mmol/L    Potassium 3.3 (L) 3.5 - 5.1 mmol/L    Chloride 105 98 - 111 mmol/L    CO2 26 22 -  32 mmol/L    Glucose, Bld 101 (H) 70 - 99 mg/dL    BUN 13 8 - 23 mg/dL    Creatinine, Ser 0.64 0.44 - 1.00 mg/dL    Calcium 8.5 (L) 8.9 - 10.3 mg/dL    GFR calc non Af Amer >60 >60 mL/min    GFR calc Af Amer >60 >60 mL/min    Anion gap 8 5 - 15      Comment: Performed at [facility] [address] [place], [place] [address]      Imaging Results (Last 48 hours)  Dg Esophagus W Single Cm (sol Or Thin Ba)   Result Date: 10/30/2018 CLINICAL DATA:  Esophageal stricture. EXAM: ESOPHOGRAM/BARIUM SWALLOW TECHNIQUE: Single contrast examination was performed using thin barium. FLUOROSCOPY TIME:  Fluoroscopy Time:  1.8 minutes Radiation Exposure Index (if provided by the fluoroscopic device): 15.80 mGy Number of Acquired Spot Images: 2 COMPARISON:  CT angiography neck  04/27/2018 FINDINGS: A problem-oriented esophagram was performed to rule out stricture. Fluoroscopic evaluation demonstrates normal caliber of the esophagus. No evidence of significant fixed stricture. No large hiatal hernia. Normal esophageal motility was observed. No reflux was observed during the examination. The patient swallowed a 13 mm barium tablet which freely passed into the stomach. The examination was performed as a single contrast study, which limited evaluation of the esophageal mucosa. IMPRESSION: Problem-oriented esophagram performed. No evidence of significant fixed esophageal stricture. Electronically Signed   By: Kellie Simmering   On: 10/30/2018 13:36            Medical Problem List and Plan: 1.  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 TEE revealed severe MR but no LA or LAA thrombus. Loop recorder placed to monitor for rhythmic/cardiac source of stroke.             -admit to inpatient rehab 2.  Antithrombotics: -DVT/anticoagulation:  Pharmaceutical: Lovenox             -antiplatelet therapy: DAPT   -ASA 81 mg daily and plavix daily for 3 weeks then plavix alone 3. Pain Management: Tylenol prn  4. Mood: LCSW to follow for evaluation and support.              -antipsychotic agents: NA 5. Neuropsych: This patient is capable of making decisions on her own behalf. 6. Skin/Wound Care: Routine pressure relief measures.  7. Fluids/Electrolytes/Nutrition: Monitor I/O. Check lytes in am. 8. HTN: Monitor BP bid--continue amlodipine and lisinopril  9.  GERD: Continue PPI. 10. Neuropathy: Managed with use of gabapentin and baclofen at nights.  11. Hypokalemia: will supplement X 2 days.     Post Admission Physician Evaluation: 1. Functional deficits secondary  to embolic CVA's. 2. Patient is admitted to receive collaborative, interdisciplinary care between the physiatrist, rehab nursing staff, and therapy team. 3. Patient's  level of medical complexity and substantial therapy needs in context of that medical necessity cannot be provided at a lesser intensity of care such as a SNF. 4. Patient has experienced substantial functional loss from his/her baseline which was documented above under the "Functional History" and "Functional Status" headings.  Judging by the patient's diagnosis, physical exam, and functional history, the patient has potential for functional progress which will result in measurable gains while on inpatient rehab.  These gains will be of substantial and practical use upon discharge  in facilitating mobility and self-care at the household level. 5. Physiatrist will provide 24 hour management  of medical needs as well as oversight of the therapy plan/treatment and provide guidance as appropriate regarding the interaction of the two. 6. The Preadmission Screening has been reviewed and patient status is unchanged unless otherwise stated above. 7. 24 hour rehab nursing will assist with bladder management, bowel management, safety, skin/wound care, disease management, medication administration and pain management  and help integrate therapy concepts, techniques,education, etc. 8. PT will assess and treat for/with: Lower extremity strength, range of motion, stamina, balance, functional mobility, safety, adaptive techniques and equipment , NMR.   Goals are: mod I to supervision. 9. OT will assess and treat for/with: ADL's, functional mobility, safety, upper extremity strength, adaptive techniques and equipment, NMr.   Goals are: supervision. Therapy may proceed with showering this patient. 10. SLP will assess and treat for/with: cognition, communication, language.  Goals are: mod I to supervision. 11. Case Management and Social Worker will assess and treat for psychological issues and discharge planning.2 12. Team conference will be held weekly to assess progress toward goals and to determine barriers to discharge.  13. Patient will receive at least 3 hours of therapy per day at least 5 days per week. 14. ELOS: 8-12 days       15. Prognosis:  good   I have personally performed a face to face diagnostic evaluation of this patient and formulated the key components of the plan.  Additionally, I have personally reviewed laboratory data, imaging studies, as well as relevant notes and concur with the physician assistant's documentation above.  Meredith Staggers, MD, Mellody Drown         Bary Leriche, PA-C 10/31/2018

## 2018-10-31 NOTE — Interval H&P Note (Signed)
History and Physical Interval Note:  10/31/2018 2:09 PM  Morgan Boyer  has presented today for surgery, with the diagnosis of stroke.  The various methods of treatment have been discussed with the patient and family. After consideration of risks, benefits and other options for treatment, the patient has consented to  Procedure(s): TRANSESOPHAGEAL ECHOCARDIOGRAM (TEE) (N/A) as a surgical intervention.  The patient's history has been reviewed, patient examined, no change in status, stable for surgery.  I have reviewed the patient's chart and labs.  Questions were answered to the patient's satisfaction.     Dorris Carnes

## 2018-10-31 NOTE — Progress Notes (Signed)
Inpatient Rehab Admissions Coordinator:   Plan for TEE this morning and then either medication management versus loop implantation.  Will plan to admit to CIR afterwards if pt remains medically stable per Dr. Erlinda Hong.  Will confirm later today.   Shann Medal, PT, DPT Admissions Coordinator [phone] 10/31/18  9:42 AM

## 2018-10-31 NOTE — H&P (View-Only) (Signed)
Progress Note  Patient Name: Morgan Boyer Date of Encounter: 10/31/2018  Primary Cardiologist: Lauree Chandler, MD   Subjective   No complaints  Inpatient Medications    Scheduled Meds:   stroke: mapping our early stages of recovery book   Does not apply Once   amLODipine  10 mg Oral Daily   aspirin EC  81 mg Oral Daily   B-complex with vitamin C  1 tablet Oral Daily   baclofen  10 mg Oral QHS   clopidogrel  75 mg Oral Daily   gabapentin  600 mg Oral QHS   lisinopril  20 mg Oral BID   pantoprazole  40 mg Oral Daily   simvastatin  20 mg Oral QPM   Continuous Infusions:  PRN Meds: acetaminophen **OR** [DISCONTINUED] acetaminophen (TYLENOL) oral liquid 160 mg/5 mL **OR** [DISCONTINUED] acetaminophen, calcium carbonate, Muscle Rub   Vital Signs    Vitals:   10/30/18 1552 10/30/18 2002 10/30/18 2344 10/31/18 0358  BP: 124/62 129/65 (!) 106/49 130/68  Pulse: 74 70 63 70  Resp: _0 Temp: 98.1 F (36.7 C) 98.5 F (36.9 C) 98.5 F (36.9 C) 98.1 F (36.7 C)  TempSrc: Oral Oral Oral Oral  SpO2: 100% 98% 98% 100%  Weight:      Height:        Intake/Output Summary (Last 24 hours) at 10/31/2018 0921 Last data filed at 10/30/2018 2130 Gross per 24 hour  Intake 240 ml  Output --  Net 240 ml   Last 3 Weights 10/26/2018 10/26/2018 10/26/2018  Weight (lbs) 149 lb 14.6 oz 150 lb 9.2 oz 150 lb 9.2 oz  Weight (kg) 68 kg 68.3 kg 68.3 kg      Telemetry    SR - Personally Reviewed  ECG    No new EKGs - Personally Reviewed  Physical Exam   GEN: No acute distress.   Neck: No JVD Cardiac: RRR, no murmurs, rubs, or gallops.  Respiratory: CTA b/l. GI: Soft, nontender, non-distended  MS: No edema; No deformity. Neuro:  no gross deficits appreciated  Psych: Normal affect   Labs    High Sensitivity Troponin:  No results for input(s): TROPONINIHS in the last 720 hours.    Cardiac EnzymesNo results for input(s): TROPONINI in the last 168 hours.  No results for input(s): TROPIPOC in the last 168 hours.   Chemistry Recent Labs  Lab 10/26/18 0146  10/26/18 0238  10/29/18 0355 10/30/18 0406 10/31/18 0531  NA SPECIMEN HEMOLYZED. HEMOLYSIS MAY AFFECT INTEGRITY OF RESULTS.   < > 133*   < > 136 138 139  K SPECIMEN HEMOLYZED. HEMOLYSIS MAY AFFECT INTEGRITY OF RESULTS.   < > 3.4*   < > 4.1 3.5 3.3*  CL SPECIMEN HEMOLYZED. HEMOLYSIS MAY AFFECT INTEGRITY OF RESULTS.   < > 99   < > 106 105 105  CO2 SPECIMEN HEMOLYZED. HEMOLYSIS MAY AFFECT INTEGRITY OF RESULTS.  --  23   < > _1 GLUCOSE SPECIMEN HEMOLYZED. HEMOLYSIS MAY AFFECT INTEGRITY OF RESULTS.   < > 110*   < > 98 100* 101*  BUN SPECIMEN HEMOLYZED. HEMOLYSIS MAY AFFECT INTEGRITY OF RESULTS.   < > 12   < > _2 CREATININE SPECIMEN HEMOLYZED. HEMOLYSIS MAY AFFECT INTEGRITY OF RESULTS.   < > 0.77   < > 0.64 0.67 0.64  CALCIUM SPECIMEN HEMOLYZED. HEMOLYSIS MAY AFFECT INTEGRITY OF RESULTS.  --  8.9   < > 8.7* 8.5* 8.5*  PROT SPECIMEN HEMOLYZED. HEMOLYSIS MAY AFFECT INTEGRITY OF RESULTS.  --  6.3*  --   --   --   --   ALBUMIN SPECIMEN HEMOLYZED. HEMOLYSIS MAY AFFECT INTEGRITY OF RESULTS.  --  3.7  --   --   --   --   AST SPECIMEN HEMOLYZED. HEMOLYSIS MAY AFFECT INTEGRITY OF RESULTS.  --  27  --   --   --   --   ALT SPECIMEN HEMOLYZED. HEMOLYSIS MAY AFFECT INTEGRITY OF RESULTS.  --  17  --   --   --   --   ALKPHOS SPECIMEN HEMOLYZED. HEMOLYSIS MAY AFFECT INTEGRITY OF RESULTS.  --  46  --   --   --   --   BILITOT SPECIMEN HEMOLYZED. HEMOLYSIS MAY AFFECT INTEGRITY OF RESULTS.  --  0.8  --   --   --   --   GFRNONAA SPECIMEN HEMOLYZED. HEMOLYSIS MAY AFFECT INTEGRITY OF RESULTS.  --  >60   < > >60 >60 >60  GFRAA SPECIMEN HEMOLYZED. HEMOLYSIS MAY AFFECT INTEGRITY OF RESULTS.  --  >60   < > >60 >60 >60  ANIONGAP SPECIMEN HEMOLYZED. HEMOLYSIS MAY AFFECT INTEGRITY OF RESULTS.  --  11   < > _0 < > = values in this interval not displayed.     Hematology Recent Labs  Lab 10/29/18 0355  10/30/18 0406 10/31/18 0531  WBC 9.1 9.4 8.6  RBC 4.22 4.19 4.14  HGB 12.1 12.0 11.8*  HCT 35.4* 35.8* 35.1*  MCV 83.9 85.4 84.8  MCH 28.7 28.6 28.5  MCHC 34.2 33.5 33.6  RDW 13.1 13.2 13.2  PLT 179 197 206    BNPNo results for input(s): BNP, PROBNP in the last 168 hours.   DDimer No results for input(s): DDIMER in the last 168 hours.   Radiology    Dg Esophagus W Single Cm (sol Or Thin Ba) Result Date: 10/30/2018 CLINICAL DATA:  Esophageal stricture. EXAM: ESOPHOGRAM/BARIUM SWALLOW TECHNIQUE: Single contrast examination was performed using thin barium. FLUOROSCOPY TIME:  Fluoroscopy Time:  1.8 minutes Radiation Exposure Index (if provided by the fluoroscopic device): 15.80 mGy Number of Acquired Spot Images: 2 COMPARISON:  CT angiography neck 04/27/2018 FINDINGS: A problem-oriented esophagram was performed to rule out stricture. Fluoroscopic evaluation demonstrates normal caliber of the esophagus. No evidence of significant fixed stricture. No large hiatal hernia. Normal esophageal motility was observed. No reflux was observed during the examination. The patient swallowed a 13 mm barium tablet which freely passed into the stomach. The examination was performed as a single contrast study, which limited evaluation of the esophageal mucosa. IMPRESSION: Problem-oriented esophagram performed. No evidence of significant fixed esophageal stricture. Electronically Signed   By: Kellie Simmering   On: 10/30/2018 13:36    Cardiac Studies   2D Echocardiogram 10/27/2018 LVEF 60-65%, moderate LVH, normal wall motion, grade 2 DD, elevated LV filling pressure, MAC with moderate MR, moderate LAE, moderate TR, RVSP 72 mmHg (moderate to severe pulmonary hypertension), IVC non-dilated but does not collapse, there is an atrial septal aneurysm (cannot exclude PFO) - although not well-visualized, there appears to be a mobile density in the left atrium in the area of the appendage that may be thrombus in the left  atrial appendage (seen in the parasternal long-axis view). Recommend TEE in the setting of stroke to better visualize LAA and interatrial septum. I was asked to read this study on 7/28 as it was discovered this morning to be  unread over the weekend (performed on 7/26).  Patient Profile     85 y.o. female w/PMHx of hypertension, hyperlipidemia, neuropathy admitted with stroke  right MCA and left posterior MCA infarcts - cardioembolic pattern - highly concerning for occult afib  TTE with questionable mobile density is LAA, pending TEE   Assessment & Plan    1. Cryptogenic stroke     Dr. Curt Bears has seen and examined the patient revisit loop implant pending TEE findings, she remains agreeable     I had discussed with her son loop implant yesterday and was on board and agreeable  Continue care with primary team Await TEE     For questions or updates, please contact [facility] Please consult [email or web address] for contact info under        Signed, Baldwin Jamaica, PA-C  10/31/2018, 9:21 AM    I have seen and examined this patient with Tommye Standard.  Agree with above, note added to reflect my findings.  On exam, RRR, no 2/6 systolic murmur, lungs clear.  Patient presented to the hospital with cryptogenic stroke. To date, no cause has been found. TEE planned for today. If unrevealing, Berneta Sconyers plan for LINQ monitor to look for atrial fibrillation. Risks and benefits discussed. Risks include but not limited to bleeding and infection. The patient understands the risks and has agreed to the procedure.  Rawn Quiroa M. Shikita Vaillancourt MD 10/31/2018 10:08 AM

## 2018-10-31 NOTE — Care Management Important Message (Signed)
Important Message  Patient Details  Name: Morgan Boyer MRN: [id number] Date of Birth: [birth year]   Medicare Important Message Given:  Yes     Orbie Pyo 10/31/2018, 2:44 PM

## 2018-10-31 NOTE — Progress Notes (Signed)
Meredith Staggers, MD  Physician  Physical Medicine and Rehabilitation  PMR Pre-admission  Signed  Date of Service:  10/30/2018 1:07 PM      Related encounter: ED to Hosp-Admission (Current) from 10/26/2018 in [facility]      Signed         Show:Clear all _0 Manual_1 Template_2 Copied  Added by: _3 Meredith Staggers, MD_4 Michel Santee, PT  _5 Hover for details PMR Admission Coordinator Pre-Admission Assessment  Patient: Morgan Boyer is an 85 y.o., female MRN: [id number] DOB: January 20, 1932 Height: _6  (157.5 cm) Weight: 68 kg  Insurance Information HMO:     PPO: yes     PCP:      IPA:      80/20:      OTHER:  PRIMARY: [employer]      Policy#: [id number]      Subscriber: patient CM Name: Tammy      Phone#: [phone]     Fax#: [place] Pre-Cert#: [id number] auth provided by Lynelle Smoke for 7 days     Employer:  Benefits:  Phone #: [phone]     Name:  Eff. Date: 04/02/2018     Deduct: $0      Out of Pocket Max: $3400 (met $90)      Life Max: n/a CIR: $295/day for days 1-6      SNF: $20/day for days 1-20 Outpatient:      Co-Pay: $15/visit Home Health: 100%      Co-Pay:  DME: 80%     Co-Pay: 20% Providers: preferred network SECONDARY:       Policy#:       Subscriber:  CM Name:       Phone#:      Fax#:  Pre-Cert#:       Employer:  Benefits:  Phone #:      Name:  Eff. Date:      Deduct:       Out of Pocket Max:       Life Max:  CIR:       SNF:  Outpatient:      Co-Pay:  Home Health:       Co-Pay:  DME:      Co-Pay:   Medicaid Application Date:       Case Manager:  Disability Application Date:       Case Worker:   The "Data Collection Information Summary" for patients in Inpatient Rehabilitation Facilities with attached "Privacy Act [place] Records" was provided and verbally reviewed with: Patient and Family  Emergency Contact Information         Contact Information    Name Relation Home Work Mobile    [name]) Son   [phone]      Current Medical History  Patient Admitting Diagnosis: L and R MCA CVA  History of Present Illness: Pt is an 85 y/o female with PMH of HTN and neuropathy that presents to [facility] on 7/26 with sudden onset of L sided weakness.  Neuro workup complete.  CT negative, MRI revealed R MCA patchy infarct and L posterior MCA infarct.  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  TEE complete this afternoon  Complete NIHSS TOTAL: 3  Patient's medical record from [facility] has been reviewed by the rehabilitation admission coordinator and physician.  Past Medical History      Past Medical History:  Diagnosis Date  . Abnormality of gait   . Adenomatous colon polyp   .  Arthritis   . Back pain   . Cancer ([place])    skin cancers  . Depression   . Diverticulosis   . DJD (degenerative joint disease) of knee   . Dyslipidemia   . Esophageal reflux   . H/O hiatal hernia   . Heart murmur   . Herpes zoster    throat  . History of shingles    left throat 07/2007  . Hyperlipidemia   . Hypertension   . Memory changes 01/14/2013  . Neuromuscular disorder ([place])    peripheral neuropathy  . Nocturnal leg cramps 07/15/2014  . Peripheral neuropathy   . Polyneuropathy in other diseases classified elsewhere ([place]) 01/14/2013    Family History   family history includes Congestive Heart Failure in her mother; Diabetes in her brother; Heart attack (age of onset: 61) in her brother; Stroke (age of onset: 2) in her father.  Prior Rehab/Hospitalizations Has the patient had prior rehab or hospitalizations prior to admission? No  Has the patient had major surgery during 100 days prior to admission? No              Current Medications  Current Facility-Administered Medications:  .  [MAR Hold]  stroke: mapping our early stages of recovery book, , Does not apply, Once, Roland Rack P, MD .  0.9 %   sodium chloride infusion, , , Continuous PRN, Fay Records, MD, Last Rate: 20 mL/hr at 10/31/18 1331, 500 mL at 10/31/18 1331 .  [MAR Hold] acetaminophen (TYLENOL) tablet 650 mg, 650 mg, Oral, Q4H PRN, 650 mg at 10/30/18 2133 **OR** [DISCONTINUED] acetaminophen (TYLENOL) solution 650 mg, 650 mg, Per Tube, Q4H PRN **OR** [DISCONTINUED] acetaminophen (TYLENOL) suppository 650 mg, 650 mg, Rectal, Q4H PRN, Greta Doom, MD .  Doug Sou Hold] amLODipine (NORVASC) tablet 10 mg, 10 mg, Oral, Daily, Rosalin Hawking, MD, 10 mg at 10/30/18 9622 .  [MAR Hold] aspirin EC tablet 81 mg, 81 mg, Oral, Daily, Rosalin Hawking, MD, 81 mg at 10/30/18 0918 .  [MAR Hold] B-complex with vitamin C tablet 1 tablet, 1 tablet, Oral, Daily, Rosalin Hawking, MD, 1 tablet at 10/30/18 [phone] .  [MAR Hold] baclofen (LIORESAL) tablet 10 mg, 10 mg, Oral, QHS, Rosalin Hawking, MD, 10 mg at 10/30/18 2134 .  [MAR Hold] calcium carbonate (TUMS - dosed in mg elemental calcium) chewable tablet 200 mg of elemental calcium, 1 tablet, Oral, Daily PRN, Amie Portland, MD, 200 mg of elemental calcium at 10/29/18 0545 .  [MAR Hold] clopidogrel (PLAVIX) tablet 75 mg, 75 mg, Oral, Daily, Rosalin Hawking, MD, 75 mg at 10/30/18 0918 .  [MAR Hold] gabapentin (NEURONTIN) capsule 600 mg, 600 mg, Oral, QHS, Greta Doom, MD, 600 mg at 10/30/18 2134 .  [MAR Hold] lisinopril (ZESTRIL) tablet 20 mg, 20 mg, Oral, BID, 20 mg at 10/30/18 2134 **OR** [DISCONTINUED] hydrochlorothiazide (MICROZIDE) capsule 12.5 mg, 12.5 mg, Oral, Daily, Rosalin Hawking, MD .  Doug Sou Hold] Muscle Rub CREA, , Topical, PRN, Amie Portland, MD .  Doug Sou Hold] pantoprazole (PROTONIX) EC tablet 40 mg, 40 mg, Oral, Daily, Rosalin Hawking, MD, 40 mg at 10/30/18 8921 .  [MAR Hold] simvastatin (ZOCOR) tablet 20 mg, 20 mg, Oral, QPM, Rosalin Hawking, MD, 20 mg at 10/30/18 1724  Patients Current Diet:     Diet Order                  Diet NPO time specified  Diet effective now  Precautions / Restrictions Precautions Precautions: Fall Restrictions Weight Bearing Restrictions: No   Has the patient had 2 or more falls or a fall with injury in the past year? No  Prior Activity Level Limited Community (1-2x/wk): was still driving, was going out of the house 1-2x/week, using a rollator  Prior Functional Level Self Care: Did the patient need help bathing, dressing, using the toilet or eating? Independent  Indoor Mobility: Did the patient need assistance with walking from room to room (with or without device)? Independent  Stairs: Did the patient need assistance with internal or external stairs (with or without device)? Independent  Functional Cognition: Did the patient need help planning regular tasks such as shopping or remembering to take medications? Independent  Home Assistive Devices / Equipment Home Equipment: Environmental consultant - 4 wheels, Shower seat, Grab bars - toilet, Grab bars - tub/shower, Hand held shower head  Prior Device Use: Indicate devices/aids used by the patient prior to current illness, exacerbation or injury? rollator  Current Functional Level Cognition  Arousal/Alertness: Awake/alert Overall Cognitive Status: Impaired/Different from baseline Current Attention Level: Selective Orientation Level: Oriented X4 Following Commands: Follows one step commands with increased time Safety/Judgement: Decreased awareness of deficits General Comments: L side inattention Attention: Sustained Sustained Attention: Appears intact Memory: (cont to assess next session) Awareness: Impaired Awareness Impairment: Anticipatory impairment Problem Solving: Impaired Safety/Judgment: (?)    Extremity Assessment (includes Sensation/Coordination)  Upper Extremity Assessment: LUE deficits/detail LUE Deficits / Details: grossly 3-/5; pt appears to have more proximal strength vs distal, minimal grip ROM/strength, L hand also bandaged due to recent fall last PM  and skin tear which limits pt's ROM, question decreased sensation  LUE Sensation: decreased proprioception LUE Coordination: decreased fine motor, decreased gross motor  Lower Extremity Assessment: Defer to PT evaluation LLE Deficits / Details: grossly 3+/5 LLE Sensation: decreased light touch LLE Coordination: decreased gross motor    ADLs  Overall ADL's : Needs assistance/impaired Eating/Feeding: Minimal assistance, Sitting Grooming: Set up, Minimal assistance, Sitting, Wash/dry face, Wash/dry hands Upper Body Bathing: Minimal assistance, Sitting Lower Body Bathing: Maximal assistance, +2 for physical assistance, +2 for safety/equipment, Sit to/from stand Upper Body Dressing : Moderate assistance, Sitting Lower Body Dressing: Maximal assistance, +2 for physical assistance, +2 for safety/equipment, Sit to/from stand Lower Body Dressing Details (indicate cue type and reason): pt requires assist to don socks, once started over foot pt able to assist with donning sock over heel/ankle, but requires increased time/effort as pt only using RUE to complete task Toilet Transfer: Moderate assistance, +2 for physical assistance, +2 for safety/equipment, Stand-pivot, BSC Toilet Transfer Details (indicate cue type and reason): use of +2 HHA Toileting- Clothing Manipulation and Hygiene: Moderate assistance, +2 for physical assistance, +2 for safety/equipment, Sit to/from stand Toileting - Clothing Manipulation Details (indicate cue type and reason): +2 assist for standing balance; pt able to perform peri-care both anteriorly and posterior, assist provided to ensure thoroughness with posterior hygiene after BM Functional mobility during ADLs: Moderate assistance, +2 for physical assistance, +2 for safety/equipment(HHA; stand pivot transfer) General ADL Comments: pt presenting with cognitive impairments, L side weakness, decreased balance     Mobility  Overal bed mobility: Needs Assistance Bed  Mobility: Supine to Sit Supine to sit: Min assist General bed mobility comments: assist to elevate trunk into sitting and to maintain balance while moving hips to EOB    Transfers  Overall transfer level: Needs assistance Equipment used: 1 person hand held assist Transfers: Sit to/from Stand Sit to Stand:  Mod assist Stand pivot transfers: Mod assist, +2 physical assistance General transfer comment: increased assist to power up and to gain balance today with initial stand from EOB; pt c/o increased back pain today compared to yesterday    Ambulation / Gait / Stairs / Wheelchair Mobility  Ambulation/Gait Ambulation/Gait assistance: Min assist, Mod assist Gait Distance (Feet): 100 Feet Assistive device: 1 person hand held assist(assist at trunk with gait belt) Gait Pattern/deviations: Step-to pattern, Decreased step length - left, Decreased weight shift to left, Decreased stride length General Gait Details: assist for balance/weight shifting; cues for increased bilat step lengths and for attention to L side; pt demonstrate L inattention and difficulty navigating environment  Gait velocity: decreased    Posture / Balance Dynamic Sitting Balance Sitting balance - Comments: min guard for sitting balance Balance Overall balance assessment: Needs assistance Sitting-balance support: Feet supported, Single extremity supported Sitting balance-Leahy Scale: Fair Sitting balance - Comments: min guard for sitting balance Standing balance support: Single extremity supported, During functional activity Standing balance-Leahy Scale: Poor Standing balance comment: pt very scared about falling requiring modA x2 for safe standing    Special needs/care consideration BiPAP/CPAP no CPM no Continuous Drip IV no Dialysis no        Days n/a Life Vest no Oxygen no Special Bed no Trach Size no Wound Vac (area) no      Location n/a Skin skin tear to L hand                           Bowel mgmt:  continent, last bm 7/30 Bladder mgmt: continent Diabetic mgmt: no Behavioral consideration no Chemo/radiation no   Previous Home Environment (from acute therapy documentation) Living Arrangements: Alone  Lives With: Alone Available Help at Discharge: Family, Available PRN/intermittently Type of Home: House Home Layout: One level Home Access: Stairs to enter Entrance Stairs-Rails: Right Entrance Stairs-Number of Steps: 2 Bathroom Shower/Tub: Chiropodist: Handicapped height  Discharge Living Setting Plans for Discharge Living Setting: Patient's home Type of Home at Discharge: House Discharge Home Layout: One level Discharge Home Access: Stairs to enter Entrance Stairs-Rails: Right Entrance Stairs-Number of Steps: 2 Discharge Bathroom Shower/Tub: Tub/shower unit Discharge Bathroom Toilet: Handicapped height Discharge Bathroom Accessibility: Yes How Accessible: Accessible via walker Does the patient have any problems obtaining your medications?: No  Social/Family/Support Systems Anticipated Caregiver: son, Nicole Kindred, and sister, Judson Roch Anticipated Ambulance person Information: Nicole Kindred is main contact [phone] Ability/Limitations of Caregiver: supervision Caregiver Availability: 24/7 Discharge Plan Discussed with Primary Caregiver: Yes Is Caregiver In Agreement with Plan?: Yes Does Caregiver/Family have Issues with Lodging/Transportation while Pt is in Rehab?: No  Goals/Additional Needs Patient/Family Goal for Rehab: PT/OT/SLP supervision to mod I Expected length of stay: 5-7 days Dietary Needs: heart healthy, thin Equipment Needs: tbd Pt/Family Agrees to Admission and willing to participate: Yes Program Orientation Provided & Reviewed with Pt/Caregiver Including Roles  & Responsibilities: Yes  Decrease burden of Care through IP rehab admission: n/a  Possible need for SNF placement upon discharge: No.  Have discussed with pt and family on 7/29 and  7/30.  Her sister, Judson Roch, will be staying with her 24/7, and she has 3 adult children who will provide assist if needed.    Patient Condition: I have reviewed medical records from [facility], spoken with CM, and patient and daughter. I met with patient at the bedside and discussed with son, Nicole Kindred, via phone for inpatient rehabilitation assessment.  Patient  will benefit from ongoing PT, OT and SLP, can actively participate in 3 hours of therapy a day 5 days of the week, and can make measurable gains during the admission.  Patient will also benefit from the coordinated team approach during an Inpatient Acute Rehabilitation admission.  The patient will receive intensive therapy as well as Rehabilitation physician, nursing, social worker, and care management interventions.  Due to safety, skin/wound care, medication administration, pain management and patient education the patient requires 24 hour a day rehabilitation nursing.  The patient is currently min with mobility and basic ADLs.  Discharge setting and therapy post discharge at home with home health is anticipated.  Patient has agreed to participate in the Acute Inpatient Rehabilitation Program and will admit today.  Preadmission Screen Completed By:  Michel Santee, PT, DPT 10/31/2018 1:37 PM ______________________________________________________________________   Discussed status with Dr. Naaman Plummer on 10/31/18  at 1:37 PM  and received approval for admission today.  Admission Coordinator:  Michel Santee, PT, DPT time 1:37 PM  Sudie Grumbling 10/31/18    Assessment/Plan: Diagnosis: embolic CVA's 1. Does the need for close, 24 hr/day Medical supervision in concert with the patient's rehab needs make it unreasonable for this patient to be served in a less intensive setting? Yes 2. Co-Morbidities requiring supervision/potential complications: CM, HTN 3. Due to bladder management, bowel management, safety, skin/wound care, disease management,  medication administration, pain management and patient education, does the patient require 24 hr/day rehab nursing? Yes 4. Does the patient require coordinated care of a physician, rehab nurse, PT (1-2 hrs/day, 5 days/week), OT (1-2 hrs/day, 5 days/week) and SLP (1-2 hrs/day, 5 days/week) to address physical and functional deficits in the context of the above medical diagnosis(es)? Yes Addressing deficits in the following areas: balance, endurance, locomotion, strength, transferring, bowel/bladder control, bathing, dressing, feeding, grooming, toileting, cognition, speech and psychosocial support 5. Can the patient actively participate in an intensive therapy program of at least 3 hrs of therapy 5 days a week? Yes 6. The potential for patient to make measurable gains while on inpatient rehab is excellent 7. Anticipated functional outcomes upon discharge from inpatients are: modified independent and supervision PT, supervision OT, modified independent and supervision SLP 8. Estimated rehab length of stay to reach the above functional goals is: 8-12 days 9. Anticipated D/C setting: Home 10. Anticipated post D/C treatments: [place] therapy 11. Overall Rehab/Functional Prognosis: excellent  MD Signature: Meredith Staggers, MD, [place] [facility] 10/31/2018         Revision History

## 2018-10-31 NOTE — Progress Notes (Addendum)
° °Progress Note ° °Patient Name: Morgan Boyer °Date of Encounter: 10/31/2018 ° °Primary Cardiologist: Christopher McAlhany, MD  ° °Subjective  ° °No complaints ° °Inpatient Medications  °  °Scheduled Meds: °•  stroke: mapping our early stages of recovery book   Does not apply Once  °• amLODipine  10 mg Oral Daily  °• aspirin EC  81 mg Oral Daily  °• B-complex with vitamin C  1 tablet Oral Daily  °• baclofen  10 mg Oral QHS  °• clopidogrel  75 mg Oral Daily  °• gabapentin  600 mg Oral QHS  °• lisinopril  20 mg Oral BID  °• pantoprazole  40 mg Oral Daily  °• simvastatin  20 mg Oral QPM  ° °Continuous Infusions: ° °PRN Meds: °acetaminophen **OR** [DISCONTINUED] acetaminophen (TYLENOL) oral liquid 160 mg/5 mL **OR** [DISCONTINUED] acetaminophen, calcium carbonate, Muscle Rub  ° °Vital Signs  °  °Vitals:  ° 10/30/18 1552 10/30/18 2002 10/30/18 2344 10/31/18 0358  °BP: 124/62 129/65 (!) 106/49 130/68  °Pulse: 74 70 63 70  °Resp: 15 17 17 17  °Temp: 98.1 °F (36.7 °C) 98.5 °F (36.9 °C) 98.5 °F (36.9 °C) 98.1 °F (36.7 °C)  °TempSrc: Oral Oral Oral Oral  °SpO2: 100% 98% 98% 100%  °Weight:      °Height:      ° ° °Intake/Output Summary (Last 24 hours) at 10/31/2018 0921 °Last data filed at 10/30/2018 2130 °Gross per 24 hour  °Intake 240 ml  °Output --  °Net 240 ml  ° °Last 3 Weights 10/26/2018 10/26/2018 10/26/2018  °Weight (lbs) 149 lb 14.6 oz 150 lb 9.2 oz 150 lb 9.2 oz  °Weight (kg) 68 kg 68.3 kg 68.3 kg  °   ° °Telemetry  °  °SR - Personally Reviewed ° °ECG  °  °No new EKGs - Personally Reviewed ° °Physical Exam  ° °GEN: No acute distress.   °Neck: No JVD °Cardiac: RRR, no murmurs, rubs, or gallops.  °Respiratory: CTA b/l. °GI: Soft, nontender, non-distended  °MS: No edema; No deformity. °Neuro:  no gross deficits appreciated  °Psych: Normal affect  ° °Labs  °  °High Sensitivity Troponin:  No results for input(s): TROPONINIHS in the last 720 hours.   ° °Cardiac EnzymesNo results for input(s): TROPONINI in the last 168 hours.  No results for input(s): TROPIPOC in the last 168 hours.  ° °Chemistry °Recent Labs  °Lab 10/26/18 °0146  10/26/18 °0238  10/29/18 °0355 10/30/18 °0406 10/31/18 °0531  °NA SPECIMEN HEMOLYZED. HEMOLYSIS MAY AFFECT INTEGRITY OF RESULTS.   < > 133*   < > 136 138 139  °K SPECIMEN HEMOLYZED. HEMOLYSIS MAY AFFECT INTEGRITY OF RESULTS.   < > 3.4*   < > 4.1 3.5 3.3*  °CL SPECIMEN HEMOLYZED. HEMOLYSIS MAY AFFECT INTEGRITY OF RESULTS.   < > 99   < > 106 105 105  °CO2 SPECIMEN HEMOLYZED. HEMOLYSIS MAY AFFECT INTEGRITY OF RESULTS.  --  23   < > 22 25 26  °GLUCOSE SPECIMEN HEMOLYZED. HEMOLYSIS MAY AFFECT INTEGRITY OF RESULTS.   < > 110*   < > 98 100* 101*  °BUN SPECIMEN HEMOLYZED. HEMOLYSIS MAY AFFECT INTEGRITY OF RESULTS.   < > 12   < > 9 12 13  °CREATININE SPECIMEN HEMOLYZED. HEMOLYSIS MAY AFFECT INTEGRITY OF RESULTS.   < > 0.77   < > 0.64 0.67 0.64  °CALCIUM SPECIMEN HEMOLYZED. HEMOLYSIS MAY AFFECT INTEGRITY OF RESULTS.  --  8.9   < > 8.7* 8.5* 8.5*  °  PROT SPECIMEN HEMOLYZED. HEMOLYSIS MAY AFFECT INTEGRITY OF RESULTS.  --  6.3*  --   --   --   --   °ALBUMIN SPECIMEN HEMOLYZED. HEMOLYSIS MAY AFFECT INTEGRITY OF RESULTS.  --  3.7  --   --   --   --   °AST SPECIMEN HEMOLYZED. HEMOLYSIS MAY AFFECT INTEGRITY OF RESULTS.  --  27  --   --   --   --   °ALT SPECIMEN HEMOLYZED. HEMOLYSIS MAY AFFECT INTEGRITY OF RESULTS.  --  17  --   --   --   --   °ALKPHOS SPECIMEN HEMOLYZED. HEMOLYSIS MAY AFFECT INTEGRITY OF RESULTS.  --  46  --   --   --   --   °BILITOT SPECIMEN HEMOLYZED. HEMOLYSIS MAY AFFECT INTEGRITY OF RESULTS.  --  0.8  --   --   --   --   °GFRNONAA SPECIMEN HEMOLYZED. HEMOLYSIS MAY AFFECT INTEGRITY OF RESULTS.  --  >60   < > >60 >60 >60  °GFRAA SPECIMEN HEMOLYZED. HEMOLYSIS MAY AFFECT INTEGRITY OF RESULTS.  --  >60   < > >60 >60 >60  °ANIONGAP SPECIMEN HEMOLYZED. HEMOLYSIS MAY AFFECT INTEGRITY OF RESULTS.  --  11   < > 8 8 8  ° < > = values in this interval not displayed.  °  ° °Hematology °Recent Labs  °Lab 10/29/18 °0355  10/30/18 °0406 10/31/18 °0531  °WBC 9.1 9.4 8.6  °RBC 4.22 4.19 4.14  °HGB 12.1 12.0 11.8*  °HCT 35.4* 35.8* 35.1*  °MCV 83.9 85.4 84.8  °MCH 28.7 28.6 28.5  °MCHC 34.2 33.5 33.6  °RDW 13.1 13.2 13.2  °PLT 179 197 206  ° ° °BNPNo results for input(s): BNP, PROBNP in the last 168 hours.  ° °DDimer No results for input(s): DDIMER in the last 168 hours.  ° °Radiology  °  °Dg Esophagus W Single Cm (sol Or Thin Ba) °Result Date: 10/30/2018 °CLINICAL DATA:  Esophageal stricture. EXAM: ESOPHOGRAM/BARIUM SWALLOW TECHNIQUE: Single contrast examination was performed using thin barium. FLUOROSCOPY TIME:  Fluoroscopy Time:  1.8 minutes Radiation Exposure Index (if provided by the fluoroscopic device): 15.80 mGy Number of Acquired Spot Images: 2 COMPARISON:  CT angiography neck 04/27/2018 FINDINGS: A problem-oriented esophagram was performed to rule out stricture. Fluoroscopic evaluation demonstrates normal caliber of the esophagus. No evidence of significant fixed stricture. No large hiatal hernia. Normal esophageal motility was observed. No reflux was observed during the examination. The patient swallowed a 13 mm barium tablet which freely passed into the stomach. The examination was performed as a single contrast study, which limited evaluation of the esophageal mucosa. IMPRESSION: Problem-oriented esophagram performed. No evidence of significant fixed esophageal stricture. Electronically Signed   By: Kyle  Golden   On: 10/30/2018 13:36  ° ° °Cardiac Studies  ° °2D Echocardiogram °10/27/2018 °LVEF 60-65%, moderate LVH, normal wall motion, grade 2 DD, elevated LV filling pressure, MAC with moderate MR, moderate LAE, moderate TR, RVSP 72 mmHg (moderate to severe pulmonary hypertension), IVC non-dilated but does not collapse, there is an atrial septal aneurysm (cannot exclude PFO) - although not well-visualized, there appears to be a mobile density in the left atrium in the area of the appendage that may be thrombus in the left  atrial appendage (seen in the parasternal long-axis view). Recommend TEE in the setting of °stroke to better visualize LAA and interatrial septum. I was asked to read this study on 7/28 as it was discovered this morning to be   unread over the weekend (performed on 7/26). ° °Patient Profile  °   °86 y.o. female w/PMHx of hypertension, hyperlipidemia, neuropathy admitted with stroke ° °right MCA and left posterior MCA infarcts - cardioembolic pattern - highly concerning for occult afib ° °TTE with questionable mobile density is LAA, pending TEE ° ° °Assessment & Plan  °  °1. Cryptogenic stroke °    Dr. Shatoria Stooksbury has seen and examined the patient revisit loop implant pending TEE findings, she remains agreeable °    I had discussed with her son loop implant yesterday and was on board and agreeable ° °Continue care with primary team °Await TEE ° ° ° ° °For questions or updates, please contact [facility] °Please consult [email or web address] for contact info under  ° °  °   °Signed, °Renee Lynn Ursuy, PA-C  °10/31/2018, 9:21 AM   ° °I have seen and examined this patient with Renee Ursuy.  Agree with above, note added to reflect my findings.  On exam, RRR, no 2/6 systolic murmur, lungs clear.  Patient presented to the hospital with cryptogenic stroke. To date, no cause has been found. TEE planned for today. If unrevealing, Osmond Steckman plan for LINQ monitor to look for atrial fibrillation. Risks and benefits discussed. Risks include but not limited to bleeding and infection. The patient understands the risks and has agreed to the procedure. ° °Ardell Aaronson M. Tyneka Scafidi MD °10/31/2018 °10:08 AM ° ° °

## 2018-11-01 ENCOUNTER — Other Ambulatory Visit: Payer: Self-pay

## 2018-11-01 ENCOUNTER — Inpatient Hospital Stay (HOSPITAL_COMMUNITY): Payer: PPO | Admitting: Physical Therapy

## 2018-11-01 ENCOUNTER — Inpatient Hospital Stay (HOSPITAL_COMMUNITY): Payer: PPO | Admitting: Occupational Therapy

## 2018-11-01 ENCOUNTER — Inpatient Hospital Stay (HOSPITAL_COMMUNITY): Payer: PPO | Admitting: Speech Pathology

## 2018-11-01 DIAGNOSIS — D62 Acute posthemorrhagic anemia: Secondary | ICD-10-CM

## 2018-11-01 DIAGNOSIS — E46 Unspecified protein-calorie malnutrition: Secondary | ICD-10-CM

## 2018-11-01 LAB — CBC WITH DIFFERENTIAL/PLATELET
Abs Immature Granulocytes: 0.02 10*3/uL (ref 0.00–0.07)
Basophils Absolute: 0 10*3/uL (ref 0.0–0.1)
Basophils Relative: 0 %
Eosinophils Absolute: 0.2 10*3/uL (ref 0.0–0.5)
Eosinophils Relative: 2 %
HCT: 36 % (ref 36.0–46.0)
Hemoglobin: 11.9 g/dL — ABNORMAL LOW (ref 12.0–15.0)
Immature Granulocytes: 0 %
Lymphocytes Relative: 27 %
Lymphs Abs: 2.1 10*3/uL (ref 0.7–4.0)
MCH: 28.5 pg (ref 26.0–34.0)
MCHC: 33.1 g/dL (ref 30.0–36.0)
MCV: 86.1 fL (ref 80.0–100.0)
Monocytes Absolute: 1.1 10*3/uL — ABNORMAL HIGH (ref 0.1–1.0)
Monocytes Relative: 14 %
Neutro Abs: 4.4 10*3/uL (ref 1.7–7.7)
Neutrophils Relative %: 57 %
Platelets: 216 10*3/uL (ref 150–400)
RBC: 4.18 MIL/uL (ref 3.87–5.11)
RDW: 13.2 % (ref 11.5–15.5)
WBC: 7.8 10*3/uL (ref 4.0–10.5)
nRBC: 0 % (ref 0.0–0.2)

## 2018-11-01 LAB — COMPREHENSIVE METABOLIC PANEL
ALT: 16 U/L (ref 0–44)
AST: 24 U/L (ref 15–41)
Albumin: 3.1 g/dL — ABNORMAL LOW (ref 3.5–5.0)
Alkaline Phosphatase: 43 U/L (ref 38–126)
Anion gap: 10 (ref 5–15)
BUN: 17 mg/dL (ref 8–23)
CO2: 24 mmol/L (ref 22–32)
Calcium: 8.7 mg/dL — ABNORMAL LOW (ref 8.9–10.3)
Chloride: 106 mmol/L (ref 98–111)
Creatinine, Ser: 0.63 mg/dL (ref 0.44–1.00)
GFR calc Af Amer: >60 mL/min (ref >60)
GFR calc non Af Amer: >60 mL/min (ref >60)
Glucose, Bld: 93 mg/dL (ref 70–99)
Potassium: 3.8 mmol/L (ref 3.5–5.1)
Sodium: 140 mmol/L (ref 135–145)
Total Bilirubin: 1.1 mg/dL (ref 0.3–1.2)
Total Protein: 6 g/dL — ABNORMAL LOW (ref 6.5–8.1)

## 2018-11-01 MED ORDER — PRO-STAT SUGAR FREE PO LIQD
30.0000 mL | Freq: Two times a day (BID) | ORAL | Status: DC
  Administered 2018-11-01 – 2018-11-11 (×21): 30 mL via ORAL
  Filled 2018-11-01 (×21): qty 30

## 2018-11-01 NOTE — Evaluation (Signed)
Occupational Therapy Assessment and Plan  Patient Details  Name: Morgan Boyer MRN: [id number] Date of Birth: 04-06-1931  OT Diagnosis: hemiplegia affecting non-dominant side and muscle weakness (generalized) Rehab Potential: Rehab Potential (ACUTE ONLY): Good ELOS: 2 weeks   Today's Date: 11/01/2018 OT Individual Time: 4696-2952 OT Individual Time Calculation (min): 85 min     Problem List:  Patient Active Problem List   Diagnosis Date Noted  . Acute blood loss anemia   . Hypoalbuminemia due to protein-calorie malnutrition ([place])   . Embolic stroke ([place]) 84/13/2440  . Esophageal stricture 10/30/2018  . Family hx-stroke 10/27/2018  . Advanced age 36/27/2020  . Stroke (cerebrum) ([facility])-R MCA & L MCA infarcts, embolic, source unknown 01/27/2535  . Thrombocytopenia ([place])   . Essential hypertension   . Hyperlipidemia   . Hypokalemia   . Lumbar stenosis with neurogenic claudication 07/03/2017  . Near syncope 11/10/2014  . Nocturnal leg cramps 07/15/2014  . Abnormality of gait 07/15/2014  . Polyneuropathy in other diseases classified elsewhere ([place]) 01/14/2013  . Memory changes 01/14/2013    Past Medical History:  Past Medical History:  Diagnosis Date  . Abnormality of gait   . Adenomatous colon polyp   . Arthritis   . Back pain   . Cancer ([place])    skin cancers  . Depression   . Diverticulosis   . DJD (degenerative joint disease) of knee   . Dyslipidemia   . Esophageal reflux   . H/O hiatal hernia   . Heart murmur   . Herpes zoster    throat  . History of shingles    left throat 07/2007  . Hyperlipidemia   . Hypertension   . Memory changes 01/14/2013  . Neuromuscular disorder ([place])    peripheral neuropathy  . Nocturnal leg cramps 07/15/2014  . Peripheral neuropathy   . Polyneuropathy in other diseases classified elsewhere ([place]) 01/14/2013   Past Surgical History:  Past Surgical History:  Procedure Laterality Date  . ABDOMINAL HYSTERECTOMY    . APPENDECTOMY     . BACK SURGERY  07/2017  . COLON SURGERY     2001 diverticulitis with infection ..drained surgically .  Marland Kitchen EYE SURGERY     cat ext bil   . JOINT REPLACEMENT     right  . KNEE ARTHROSCOPY  11/08/2011  . TONSILLECTOMY    . TOTAL KNEE ARTHROPLASTY  11/07/2011   Procedure: TOTAL KNEE ARTHROPLASTY;  Surgeon: Ninetta Lights, MD;  Location: [facility];  Service: Orthopedics;  Laterality: Left;  . TUBAL LIGATION    . WRIST SURGERY     for skin cancer    Assessment & Plan Clinical Impression: Patient is a 85 y.o. year old female with history of HTN, GERD, neuropathy (hands/feet), headachesin the past, depression who was admitted on 10/26/18 withsudden onset of left sided weakness. CTA head/neck negative for LVO and she received tPA. Follow up MRI brain done revealing acute/subacute non-hemorrhagic scattered cortical infarct in right frontal, high right parietal and some in posterior parietal and left occipital lobes felt to be due to central embolic source.Stroke felt to be embolic concerning for occult A fib per Dr. Garner Nash D echo showed EF 60-65 % with increase in LV wall, moderate dilation of LA, moderate MVR, moderate TR, atrial septal aneurysm with mobile density in LAA --question thrombus. TEE ordered for work but but reported difficulty with swallowing and tendency for food to get stuck. Due to history of esophageal stricture in past esophaogram done 7/30 and was  negative for stricture.She underwent TEE with loop placement today. Patient with resultant gait disorder with left inattention and had deficits in higher level cognition.   Patient transferred to CIR on 10/31/2018 .    Patient currently requires max with basic self-care skills secondary to unbalanced muscle activation, decreased coordination and decreased motor planning and decreased sitting balance, decreased standing balance, decreased postural control, hemiplegia and decreased balance strategies.  Prior to hospitalization, patient could  complete adl with independent .  Patient will benefit from skilled intervention to decrease level of assist with basic self-care skills, increase independence with basic self-care skills and increase level of independence with iADL prior to discharge home with care partner.  Anticipate patient will require intermittent supervision and follow up home health.  OT - End of Session Activity Tolerance: Tolerates 30+ min activity with multiple rests Endurance Deficit: Yes Endurance Deficit Description: fatigue with adl OT Assessment Rehab Potential (ACUTE ONLY): Good OT Patient demonstrates impairments in the following area(s): Balance;Endurance;Motor OT Basic ADL's Functional Problem(s): Eating;Grooming;Bathing;Dressing;Toileting OT Advanced ADL's Functional Problem(s): Simple Meal Preparation;Light Housekeeping OT Transfers Functional Problem(s): Toilet;Tub/Shower OT Additional Impairment(s): Fuctional Use of Upper Extremity OT Plan OT Intensity: Minimum of 1-2 x/day, 45 to 90 minutes OT Frequency: 5 out of 7 days OT Duration/Estimated Length of Stay: 2 weeks OT Treatment/Interventions: Balance/vestibular training;Neuromuscular re-education;Self Care/advanced ADL retraining;Therapeutic Exercise;DME/adaptive equipment instruction;UE/LE Strength taining/ROM;Community reintegration;Patient/family education;UE/LE Coordination activities;Discharge planning;Functional mobility training;Therapeutic Activities OT Self Feeding Anticipated Outcome(s): independent OT Basic Self-Care Anticipated Outcome(s): S/mod I OT Toileting Anticipated Outcome(s): mod I OT Bathroom Transfers Anticipated Outcome(s): S/mod I OT Recommendation Patient destination: Home Follow Up Recommendations: Home health OT Equipment Recommended: To be determined Equipment Details: patient owns rollator, tub seat, handicapped height toilet, grab bars in bathroom   Skilled Therapeutic Intervention Patient in bed, alert and ready  for therapy session.  Evaluation completed as documented below, provided w/c cushion and left 1/2 lap tray for comfort and positioning. Reviewed role of OT, plan of care, therapy schedule, safety with ADL and mobility, goals for therapy.  Patient demonstrates good understanding and safety although she notes not being as sharp as she was prior to CVA.  adl performed - details below, functional transfers with min a and cues, UB bathing/dressing min/mod A, LB bathing/dressing mod/max A.  She presents with weakness in left UE - limited grasp and coordination.  She remained seated in w/c at close of session with seat belt alarm set and call bell/tray table in reach.    OT Evaluation Precautions/Restrictions  Precautions Precautions: Fall Restrictions Weight Bearing Restrictions: No Other Position/Activity Restrictions: skin tear left hand, loop recorder surgical site chest General Chart Reviewed: Yes Vital Signs Therapy Vitals Temp: 98.2 F (36.8 C) Pulse Rate: 67 Resp: 20 BP: 136/69 Patient Position (if appropriate): Sitting Oxygen Therapy SpO2: 100 % O2 Device: Room Air Pain Pain Assessment Pain Scale: 0-10 Pain Score: 0-No pain Pain Intervention(s): Other (Comment)(therapy to tolerance) Home Living/Prior [place] expects to be discharged to:: Private residence Living Arrangements: Alone Available Help at Discharge: Family, Available 24 hours/day Type of Home: House Home Access: Stairs to enter [employer] of Steps: 2 Entrance Stairs-Rails: Right Home Layout: One level Bathroom Shower/Tub: Chiropodist: Handicapped height Bathroom Accessibility: Yes  Lives With: Alone Prior Function Level of Independence: Independent with gait, Independent with transfers, Independent with basic ADLs, Independent with homemaking with ambulation  Able to Take Stairs?: Yes Driving: Yes Vocation: Retired Comments: reports she goes to  [employer]  2-3days/week to walk and goes to the grocery store and [employer] ADL ADL Eating: Set up Where Assessed-Eating: Wheelchair Grooming: Minimal assistance Where Assessed-Grooming: Sitting at sink Upper Body Bathing: Minimal assistance Where Assessed-Upper Body Bathing: Sitting at sink Lower Body Bathing: Moderate assistance Where Assessed-Lower Body Bathing: Sitting at sink, Standing at sink Upper Body Dressing: Moderate assistance Where Assessed-Upper Body Dressing: Wheelchair Lower Body Dressing: Maximal assistance Where Assessed-Lower Body Dressing: Wheelchair ADL Comments: patient declined shower due to loop recorder placement yesterday and concern for it getting wet Vision Baseline Vision/History: Wears glasses Wears Glasses: Reading only Patient Visual Report: No change from baseline Vision Assessment?: Yes Eye Alignment: Within Functional Limits Ocular Range of Motion: Within Functional Limits Alignment/Gaze Preference: Within Defined Limits Tracking/Visual Pursuits: Able to track stimulus in all quads without difficulty Saccades: Decreased speed of saccadic movement Convergence: Within functional limits Visual Fields: No apparent deficits Perception  Perception: Within Functional Limits Praxis Praxis: Intact Cognition Overall Cognitive Status: Within Functional Limits for tasks assessed Arousal/Alertness: Awake/alert Orientation Level: Person;Place;Situation Person: Oriented Place: Oriented Situation: Oriented Year: 2020 Month: August Day of Week: Correct Memory: Impaired Memory Impairment: Decreased short term memory Decreased Short Term Memory: Verbal basic Immediate Memory Recall: Sock;Blue;Bed Memory Recall Sock: Without Cue Memory Recall Blue: With Cue Memory Recall Bed: Without Cue Attention: Focused;Sustained Focused Attention: Appears intact Sustained Attention: Appears intact Awareness: Impaired Awareness Impairment: Anticipatory  impairment Problem Solving: Impaired Problem Solving Impairment: Verbal complex Executive Function: Reasoning Reasoning: Impaired Reasoning Impairment: Verbal basic Safety/Judgment: Appears intact Sensation Sensation Light Touch: Impaired by gross assessment(occasional delay in response to light touch on L LE and pt has hx of B UE and LE peripheral neuropathy) Hot/Cold: Not tested Proprioception: Appears Intact;Impaired by gross assessment Stereognosis: Not tested Coordination Gross Motor Movements are Fluid and Coordinated: No Fine Motor Movements are Fluid and Coordinated: No Coordination and Movement Description: impaired due to strength and sensation deficits Finger Nose Finger Test: right WFL, unable left Heel Shin Test: decreased speed but symmetrical Motor  Motor Motor: Hemiplegia Motor - Skilled Clinical Observations: left side weakness Mobility  Bed Mobility Bed Mobility: Rolling Left;Left Sidelying to Sit Rolling Left: Supervision/Verbal cueing Left Sidelying to Sit: Moderate Assistance - Patient 50-74% Transfers Sit to Stand: Contact Guard/Touching assist Stand to Sit: Contact Guard/Touching assist  Trunk/Postural Assessment  Cervical Assessment Cervical Assessment: Within Functional Limits  Balance Balance Balance Assessed: Yes Dynamic Sitting Balance Dynamic Sitting - Balance Support: Feet supported Dynamic Sitting - Level of Assistance: 5: Stand by assistance Dynamic Standing Balance Dynamic Standing - Balance Support: During functional activity Dynamic Standing - Level of Assistance: 4: Min assist Extremity/Trunk Assessment RUE Assessment RUE Assessment: Within Functional Limits General Strength Comments: 4+/5 LUE Assessment Passive Range of Motion (PROM) Comments: WFL t/o, hand bandaged limiting thumb extension Active Range of Motion (AROM) Comments: shoulder 3/4 to full, elbow 3/4 to full, wrist 3/4, digits 3/4 General Strength Comments: able to use  left UE as stabiizer and gross assist for adl tasks     Refer to Care Plan for Long Term Goals  Recommendations for other services: None    Discharge Criteria: Patient will be discharged from OT if patient refuses treatment 3 consecutive times without medical reason, if treatment goals not met, if there is a change in medical status, if patient makes no progress towards goals or if patient is discharged from hospital.  The above assessment, treatment plan, treatment alternatives and goals were discussed and mutually agreed upon: by patient  Erline Levine  A Tya Haughey 11/01/2018, 4:18 PM

## 2018-11-01 NOTE — Progress Notes (Signed)
[place] PHYSICAL MEDICINE & REHABILITATION PROGRESS NOTE  Subjective/Complaints: Patient seen sitting up in bed this morning eating breakfast.  She states she slept well overnight.  She is ready begin therapies today.  ROS: Denies CP, shortness of breath, nausea, vomiting, diarrhea.  Objective: Vital Signs: Blood pressure 135/81, pulse 70, temperature 98 F (36.7 C), resp. rate 18, height 5\' 2"  (1.575 m), weight 65.2 kg, SpO2 100 %. Dg Esophagus W Single Cm (sol Or Thin Ba)  Result Date: 10/30/2018 CLINICAL DATA:  Esophageal stricture. EXAM: ESOPHOGRAM/BARIUM SWALLOW TECHNIQUE: Single contrast examination was performed using thin barium. FLUOROSCOPY TIME:  Fluoroscopy Time:  1.8 minutes Radiation Exposure Index (if provided by the fluoroscopic device): 15.80 mGy Number of Acquired Spot Images: 2 COMPARISON:  CT angiography neck 04/27/2018 FINDINGS: A problem-oriented esophagram was performed to rule out stricture. Fluoroscopic evaluation demonstrates normal caliber of the esophagus. No evidence of significant fixed stricture. No large hiatal hernia. Normal esophageal motility was observed. No reflux was observed during the examination. The patient swallowed a 13 mm barium tablet which freely passed into the stomach. The examination was performed as a single contrast study, which limited evaluation of the esophageal mucosa. IMPRESSION: Problem-oriented esophagram performed. No evidence of significant fixed esophageal stricture. Electronically Signed   By: Kellie Simmering   On: 10/30/2018 13:36   Recent Labs    10/31/18 0531 11/01/18 0421  WBC 8.6 7.8  HGB 11.8* 11.9*  HCT 35.1* 36.0  PLT 206 216   Recent Labs    10/31/18 0531 11/01/18 0421  NA 139 140  K 3.3* 3.8  CL 105 106  CO2 26 24  GLUCOSE 101* 93  BUN 13 17  CREATININE 0.64 0.63  CALCIUM 8.5* 8.7*    Physical Exam: BP 135/81 (BP Location: Left Arm)   Pulse 70   Temp 98 F (36.7 C)   Resp 18   Ht 5\' 2"  (1.575 m)   Wt  65.2 kg   SpO2 100%   BMI 26.29 kg/m  Constitutional: No distress . Vital signs reviewed. HENT: Normocephalic.  Atraumatic. Eyes: EOMI. No discharge. Cardiovascular: No JVD. Respiratory: Normal effort. GI: Non-distended. Musc: No edema or tenderness in extremities. Neurological: Alert  HOH Slow processing Motor:  RUE/RLE: Grossly 4+/5 LUE/LLE: Grossly 2+/5 with apraxia Skin: Skin iswarm.  Psychiatric: Normal mood.  Normal behavior.  Assessment/Plan: 1. 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  Physiatrist is providing close team supervision and 24 hour management of active medical problems listed below.  Physiatrist and rehab team continue to assess barriers to discharge/monitor patient progress toward functional and medical goals  Care Tool:  Bathing              Bathing assist       Upper Body Dressing/Undressing Upper body dressing        Upper body assist      Lower Body Dressing/Undressing Lower body dressing            Lower body assist       Toileting Toileting    Toileting assist Assist for toileting: Supervision/Verbal cueing     Transfers Chair/bed transfer  Transfers assist     Chair/bed transfer assist level: Moderate Assistance - Patient 50 - 74%     Locomotion Ambulation   Ambulation assist              Walk 10 feet activity  Assist           Walk 50 feet activity   Assist           Walk 150 feet activity   Assist           Walk 10 feet on uneven surface  activity   Assist           Wheelchair     Assist               Wheelchair 50 feet with 2 turns activity    Assist            Wheelchair 150 feet activity     Assist            Medical Problem List and Plan: 1.Functional deficits, left hemiplegia, aphasiasecondary to embolic  Right MCA and Left MCA infarcts. TEE revealed severe MR but no LA or LAA thrombus. Loop recorder placed to monitor for rhythmic/cardiac source of stroke.  Begin CIR evaluations 2. Antithrombotics: -DVT/anticoagulation:Pharmaceutical:Lovenox -antiplatelet therapy: DAPT                         -ASA 81 mg daily and plavix daily for 3 weeks then plavix alone 3. Pain Management:Tylenol prn 4. Mood:LCSW to follow for evaluation and support. -antipsychotic agents: NA 5. Neuropsych: This patientis?  Fully capable of making decisions on herown behalf. 6. Skin/Wound Care:Routine pressure relief measures. 7. Fluids/Electrolytes/Nutrition:Monitor I/O. Check lytes in am. 8. GDJ:MEQASTM BP bid--continue amlodipine and lisinopril  Monitor with increased mobility 9. GERD: Continue PPI. 10. Neuropathy: Managed with use of gabapentin and baclofen at nights. 11. Hypokalemia:   Potassium 3.8 on 8/1  Supplementing X 2 days. 12.  Hypoalbuminemia  Supplement initiated on 8/1 13.  Acute blood loss anemia  Hemoglobin 11.9 on 8/1   Continue to monitor   LOS: 1 days A FACE TO FACE EVALUATION WAS PERFORMED  Karri Kallenbach Lorie Phenix 11/01/2018, 9:21 AM

## 2018-11-01 NOTE — Evaluation (Addendum)
Speech Language Pathology Assessment and Plan  Patient Details  Name: Morgan Boyer MRN: [id number] Date of Birth: 09-02-1931  SLP Diagnosis: Cognitive Impairments  Rehab Potential: Good ELOS: 5-7 days   Today's Date: 11/01/2018 SLP Individual Time: 7425-9563 SLP Individual Time Calculation (min): 60 min   Problem List:  Patient Active Problem List   Diagnosis Date Noted  . Acute blood loss anemia   . Hypoalbuminemia due to protein-calorie malnutrition ([place])   . Embolic stroke ([place]) 87/56/4332  . Esophageal stricture 10/30/2018  . Family hx-stroke 10/27/2018  . Advanced age 26/27/2020  . Stroke (cerebrum) ([facility])-R MCA & L MCA infarcts, embolic, source unknown 95/18/8416  . Thrombocytopenia ([place])   . Essential hypertension   . Hyperlipidemia   . Hypokalemia   . Lumbar stenosis with neurogenic claudication 07/03/2017  . Near syncope 11/10/2014  . Nocturnal leg cramps 07/15/2014  . Abnormality of gait 07/15/2014  . Polyneuropathy in other diseases classified elsewhere ([place]) 01/14/2013  . Memory changes 01/14/2013   Past Medical History:  Past Medical History:  Diagnosis Date  . Abnormality of gait   . Adenomatous colon polyp   . Arthritis   . Back pain   . Cancer ([place])    skin cancers  . Depression   . Diverticulosis   . DJD (degenerative joint disease) of knee   . Dyslipidemia   . Esophageal reflux   . H/O hiatal hernia   . Heart murmur   . Herpes zoster    throat  . History of shingles    left throat 07/2007  . Hyperlipidemia   . Hypertension   . Memory changes 01/14/2013  . Neuromuscular disorder ([place])    peripheral neuropathy  . Nocturnal leg cramps 07/15/2014  . Peripheral neuropathy   . Polyneuropathy in other diseases classified elsewhere ([place]) 01/14/2013   Past Surgical History:  Past Surgical History:  Procedure Laterality Date  . ABDOMINAL HYSTERECTOMY    . APPENDECTOMY    . BACK SURGERY  07/2017  . COLON SURGERY     2001 diverticulitis with  infection ..drained surgically .  Marland Kitchen EYE SURGERY     cat ext bil   . JOINT REPLACEMENT     right  . KNEE ARTHROSCOPY  11/08/2011  . TONSILLECTOMY    . TOTAL KNEE ARTHROPLASTY  11/07/2011   Procedure: TOTAL KNEE ARTHROPLASTY;  Surgeon: Ninetta Lights, MD;  Location: [facility];  Service: Orthopedics;  Laterality: Left;  . TUBAL LIGATION    . WRIST SURGERY     for skin cancer    Assessment / Plan / Recommendation Clinical Impression SAY:TKZSWF [name] is an 85 year old female with history of HTN, GERD, neuropathy (hands/feet), headachesin the past, depression who was admitted on 10/26/18 withsudden onset of left sided weakness. CTA head/neck negative for LVO and she received tPA. Follow up MRI brain done revealing acute/subacute non-hemorrhagic scattered cortical infarct in right frontal, high right parietal and some in posterior parietal and left occipital lobes felt to be due to central embolic source.Stroke felt to be embolic concerning for occult A fib per Dr. Garner Nash D echo showed EF 60-65 % with increase in LV wall, moderate dilation of LA, moderate MVR, moderate TR, atrial septal aneurysm with mobile density in LAA --question thrombus. TEE ordered for work but but reported difficulty with swallowing and tendency for food to get stuck. Due to history of esophageal stricture in past esophaogram done 7/30 and was negative for stricture.She underwent TEE with loop placement  today. Patient with resultant gait disorder with left inattention and had deficits in higher level cognition. CIR recommended due to functional decline.   Due to patient complaints of difficulty swallowing a clinical swallow evaluation was completed. Patient reports difficulty swallowing pills. Patient presents with a functional oral phase of swallowing with no s/sx of pharyngeal dysphagia. Patient demonstrated functional mastication of regular solid consistency with no oral residues noted. No overt s/sx of aspiration were  observed with 4oz of thin liquids, puree, or solid consistency. Patient reports that at home she consumes a "soft diet" and wishes to continue dysphagia 3 diet while in the hospital. Educated patient on placing medications whole in apple sauce due to patient reports of pills being difficult to swallow with water. Recommend continue mechanical soft diet and thin liquids.  Cognitive-linguistic evaluation was completed. Patient participated in the Oriental with the following scores: orientation 11/12 average range, attention 8/8 average range, registration 3 average range, comprehension 6/6 average range, repetition 12/12 average range, naming 7/8 average range, construction 2/6 moderate impairment, memory 6/12 moderate impairment, calculations 2/4 mild impairment, reasoning similarities 4/8 mild impairment, reasoning judgement 3/6 mild impairment. Basic expressive and receptive language skills are [facility]. Speech is 100% intelligible in conversation with no dysarthria or apraxia noted.  Patient would benefit from skilled SLP services during CIR stay targeting cognitive deficits to increase functional independence in the home environment upon discharge.   Skilled Therapeutic Interventions          Clinical swallow evaluation & cognitive-linguistic evaluation. Education regarding POC.  SLP Assessment  Patient will need skilled Speech Lanaguage Pathology Services during CIR admission    Recommendations  SLP Diet Recommendations: Thin;Dysphagia 3 (Mech soft)- at patient request, may advance to regular diet as patient wishes Liquid Administration via: Cup;Straw Medication Administration: Whole meds with puree Supervision: Patient able to self feed Postural Changes and/or Swallow Maneuvers: Seated upright 90 degrees Oral Care Recommendations: Oral care BID Patient destination: Home Follow up Recommendations: Home Health SLP Equipment Recommended: None recommended by SLP    SLP Frequency 3 to 5 out of 7  days   SLP Duration  SLP Intensity  SLP Treatment/Interventions 5-7 days  Minumum of 1-2 x/day, 30 to 90 minutes  Cognitive remediation/compensation;Therapeutic Activities;Therapeutic Exercise;Internal/external aids    Pain Pain Assessment Pain Scale: 0-10 Pain Score: 0-No pain  Prior Functioning Cognitive/Linguistic Baseline: Within functional limits Type of Home: House  Lives With: Alone Available Help at Discharge: Family;Available PRN/intermittently Vocation: Retired  Industrial/product designer Term Goals: Week 1: SLP Short Term Goal 1 (Week 1): Patient will demonstrate funtional problem solving for mildly complex tasks with min verbal cues SLP Short Term Goal 2 (Week 1): Patient will demonstrate recent (10 minute or greater) recall of novel infomration with Min A verbal c ues with use of compenstaory strategies. SLP Short Term Goal 3 (Week 1): Patient will complete medication and money management tasks with supervision.  Refer to Care Plan for Long Term Goals  Recommendations for other services: Neuropsych  Discharge Criteria: Patient will be discharged from SLP if patient refuses treatment 3 consecutive times without medical reason, if treatment goals not met, if there is a change in medical status, if patient makes no progress towards goals or if patient is discharged from hospital.  The above assessment, treatment plan, treatment alternatives and goals were discussed and mutually agreed upon: by patient  Cristy Folks 11/01/2018, 12:22 PM

## 2018-11-01 NOTE — Evaluation (Signed)
Physical Therapy Assessment and Plan  Patient Details  Name: Morgan Boyer MRN: [id number] Date of Birth: 1931/04/13  PT Diagnosis: Abnormal posture, Abnormality of gait, Cognitive deficits, Difficulty walking, Hemiparesis non-dominant, Impaired cognition, Impaired sensation and Muscle weakness Rehab Potential: Good ELOS: 2 weeks   Today's Date: 11/01/2018 PT Individual Time: 5997-7414 PT Individual Time Calculation (min): 59 min    Problem List:  Patient Active Problem List   Diagnosis Date Noted  . Embolic stroke ([place]) 23/95/3202  . Esophageal stricture 10/30/2018  . Family hx-stroke 10/27/2018  . Advanced age 85/27/2020  . 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 Thrombocytopenia ([place])   . Essential hypertension   . Hyperlipidemia   . Hypokalemia   . Lumbar stenosis with neurogenic claudication 07/03/2017  . Near syncope 11/10/2014  . Nocturnal leg cramps 07/15/2014  . Abnormality of gait 07/15/2014  . Polyneuropathy in other diseases classified elsewhere ([place]) 01/14/2013  . Memory changes 01/14/2013    Past Medical History:  Past Medical History:  Diagnosis Date  . Abnormality of gait   . Adenomatous colon polyp   . Arthritis   . Back pain   . Cancer ([place])    skin cancers  . Depression   . Diverticulosis   . DJD (degenerative joint disease) of knee   . Dyslipidemia   . Esophageal reflux   . H/O hiatal hernia   . Heart murmur   . Herpes zoster    throat  . History of shingles    left throat 07/2007  . Hyperlipidemia   . Hypertension   . Memory changes 01/14/2013  . Neuromuscular disorder ([place])    peripheral neuropathy  . Nocturnal leg cramps 07/15/2014  . Peripheral neuropathy   . Polyneuropathy in other diseases classified elsewhere ([place]) 01/14/2013   Past Surgical History:  Past Surgical History:  Procedure Laterality Date  . ABDOMINAL HYSTERECTOMY    . APPENDECTOMY    . BACK SURGERY  07/2017  . COLON  SURGERY     2001 diverticulitis with infection ..drained surgically .  Marland Kitchen EYE SURGERY     cat ext bil   . JOINT REPLACEMENT     right  . KNEE ARTHROSCOPY  11/08/2011  . TONSILLECTOMY    . TOTAL KNEE ARTHROPLASTY  11/07/2011   Procedure: TOTAL KNEE ARTHROPLASTY;  Surgeon: Ninetta Lights, MD;  Location: [place];  Service: Orthopedics;  Laterality: Left;  . TUBAL LIGATION    . WRIST SURGERY     for skin cancer    Assessment & Plan Clinical Impression: Patient is a 85 y.o. year old female with history of HTN, GERD, neuropathy (hands/feet), headachesin the past, depression who was admitted on 10/26/18 withsudden onset of left sided weakness. CTA head/neck negative for LVO and she received tPA. Follow up MRI brain done revealing acute/subacute non-hemorrhagic scattered cortical infarct in right frontal, high right parietal and some in posterior parietal and left occipital lobes felt to be due to central embolic source.Stroke felt to be embolic concerning for occult A fib per Dr. Garner Nash D echo showed EF 60-65 % with increase in LV wall, moderate dilation of LA, moderate MVR, moderate TR, atrial septal aneurysm with mobile density in LAA --question thrombus. TEE ordered for work but but reported difficulty with swallowing and tendency for food to get stuck. Due to history of esophageal stricture in past esophaogram done 7/30 and was negative for stricture.She underwent TEE with loop placement today. Patient with  resultant gait disorder with left inattention and had deficits in higher level cognition. CIR recommended due to functional decline. Patient transferred to CIR on 10/31/2018 .   Patient currently requires min with mobility secondary to muscle weakness, decreased cardiorespiratoy endurance, impaired timing and sequencing and unbalanced muscle activation, decreased memory and delayed processing and decreased standing balance, decreased postural control and decreased balance strategies.  Prior to  hospitalization, patient was independent  with mobility and lived with Alone in a House home.  Home access is 2Stairs to enter.  Patient will benefit from skilled PT intervention to maximize safe functional mobility, minimize fall risk and decrease caregiver burden for planned discharge home with 24 hour supervision. Anticipate patient will benefit from follow up [place] at discharge.  PT - End of Session Activity Tolerance: Tolerates 30+ min activity with multiple rests Endurance Deficit: Yes Endurance Deficit Description: fatigue requiring rest breaks PT Assessment Rehab Potential (ACUTE/IP ONLY): Good PT Barriers to Discharge: [place] home environment;Home environment access/layout;Lack of/limited family support;Decreased caregiver support PT Patient demonstrates impairments in the following area(s): Balance;Safety;Sensory;Endurance;Motor PT Transfers Functional Problem(s): Bed Mobility;Bed to Chair;Car;Furniture PT Locomotion Functional Problem(s): Ambulation;Wheelchair Mobility;Stairs PT Plan PT Intensity: Minimum of 1-2 x/day ,45 to 90 minutes PT Frequency: 5 out of 7 days PT Duration Estimated Length of Stay: 2 weeks PT Treatment/Interventions: Ambulation/gait training;Community reintegration;Neuromuscular re-education;DME/adaptive equipment instruction;Psychosocial support;UE/LE Strength taining/ROM;Stair training;Wheelchair propulsion/positioning;Balance/vestibular training;Discharge planning;Functional electrical stimulation;Pain management;Skin care/wound management;Therapeutic Activities;UE/LE Coordination activities;Cognitive remediation/compensation;Disease management/prevention;Functional mobility training;Patient/family education;Splinting/orthotics;Therapeutic Exercise;Visual/perceptual remediation/compensation PT Transfers Anticipated Outcome(s): supervision PT Locomotion Anticipated Outcome(s): supervision using LRAD PT Recommendation Recommendations for Other Services:  Therapeutic Recreation consult Therapeutic Recreation Interventions: Stress management Follow Up Recommendations: 24 hour supervision/assistance;Home health PT Patient destination: Home Equipment Recommended: To be determined Equipment Details: has rollator  Skilled Therapeutic Intervention Evaluation completed (see details above and below) with education on PT POC and goals and individual treatment initiated with focus on bed mobility, transfers, gait training, stair navigation, car transfers, as well as education regarding daily therapy schedule, weekly team meetings, purpose of PT evaluation, and other CIR information. Pt received sitting in w/c and agreeable to therapy session. Transported to/from gym in w/c. Ambulated 160f, no AD, with min assist for balance - demonstrates significantly decreased gait speed and decreased B LE step length. Ascended/descended 12 steps using unilateral R UE support on B HRs and mod progressed to min assist for balance - reciprocal pattern on ascent and step-to pattern on descent. Ambulatory car transfer to simulated SUV height with min assist for balance upon getting in/out of car with visual demonstration and education on proper sequencing of car transfer. Ambulated ~174fup/down ramp, ~1019f over mulch, and up/down curb using handrail all with min assist for balance and cuing for safety when stepping up/down curb. Transported back to room in w/c. Stand pivot EOB<>w/c, no AD, with min assist/CGA for balance. Sit>supine with min assist for trunk descent and LE management; supine>sit with CGA for steadying trunk. Transferred back to w/c as described above and pt left sitting in w/c with needs in reach and seat belt alarm on.  PT Evaluation Precautions/Restrictions Precautions Precautions: Fall Restrictions Weight Bearing Restrictions: No Pain Pain Assessment Pain Scale: 0-10 Pain Score: 0-No pain Pain Intervention(s): Other (Comment)(therapy to  tolerance) Home Living/Prior Functioning Home Living Available Help at Discharge: Family;Available 24 hours/day(pt's sister, SarJudson [place]och[place] of Home: House Home Access: Stairs to enter [employer] Steps: 2 Entrance Stairs-Rails: Right Home Layout: One level  Lives With: Alone Prior Function Level of  Independence: Independent with gait;Independent with transfers(reports using a rollator at night to walk to/from bathroom but otherwise does not use AD)  Able to Take Stairs?: Yes Driving: Yes Vocation: Retired Comments: reports she goes to [employer] 2-3days/week to walk and 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: Within Functional Limits Inattention/Neglect: (decreased attention to L) Praxis Praxis: Intact  Cognition  Overall Cognitive Status: Impaired/Different from baseline Arousal/Alertness: Awake/alert Orientation Level: Oriented X4 Attention: Focused;Sustained Focused Attention: Appears intact Sustained Attention: Appears intact Safety/Judgment: Appears intact Sensation Sensation Light Touch: Impaired by gross assessment(occasional delay in response to light touch on L LE and pt has hx of B UE and LE peripheral neuropathy) Hot/Cold: Not tested Proprioception: Impaired by gross assessment(noted lack of awareness of foot placement during functional mobility (especially stairs)) Stereognosis: Not tested Coordination Gross Motor Movements are Fluid and Coordinated: No Coordination and Movement Description: impaired due to strength and sensation deficits Heel Shin Test: decreased speed but symmetrical Motor  Motor Motor: Hemiplegia Motor - Skilled Clinical Observations: left side weakness (UE>LE)  Mobility Bed Mobility Bed Mobility: Sit to Supine;Supine to Sit Rolling Left: Supervision/Verbal cueing Left Sidelying to Sit: Moderate Assistance - Patient 50-74% Supine to Sit: Contact Guard/Touching assist Sit to Supine: Minimal  Assistance - Patient > 75% Transfers Transfers: Sit to Stand;Stand to [employer] Transfers Sit to Stand: Minimal Assistance - Patient > 75% Stand to Sit: Minimal Assistance - Patient > 75% Stand Pivot Transfers: Minimal Assistance - Patient > 75% Stand Pivot Transfer Details: Tactile cues for sequencing;Tactile cues for posture;Verbal cues for technique;Verbal cues for sequencing;Verbal cues for gait pattern;Verbal cues for precautions/safety Transfer (Assistive device): None Locomotion  Gait Ambulation: Yes Gait Assistance: Minimal Assistance - Patient > 75% Gait Distance (Feet): 145 Feet Assistive device: None Gait Assistance Details: Tactile cues for sequencing;Tactile cues for weight shifting;Verbal cues for technique;Verbal cues for gait pattern;Verbal cues for sequencing Gait Gait: Yes Gait Pattern: Impaired Gait Pattern: Decreased stride length;Decreased step length - left;Decreased step length - right;Poor foot clearance - left;Poor foot clearance - right Gait velocity: decreased Stairs / Additional Locomotion Stairs: Yes Stairs Assistance: Minimal Assistance - Patient > 75%;Moderate Assistance - Patient 50 - 74% Stair Management Technique: Two rails Number of Stairs: 12 Height of Stairs: 6 Ramp: Minimal Assistance - Patient >75% Curb: Minimal Assistance - Patient >75% Wheelchair Mobility Wheelchair Mobility: No  Trunk/Postural Assessment  Cervical Assessment Cervical Assessment: Within Functional Limits Thoracic Assessment Thoracic Assessment: Exceptions to WFL(rounded shoulders) Postural Control Postural Control: Deficits on evaluation Righting Reactions: impaired Postural Limitations: decreased  Balance Balance Balance Assessed: Yes Static Sitting Balance Static Sitting - Level of Assistance: 5: Stand by assistance Dynamic Sitting Balance Dynamic Sitting - Balance Support: Feet supported Dynamic Sitting - Level of Assistance: 5: Stand by  assistance Static Standing Balance Static Standing - Balance Support: During functional activity Static Standing - Level of Assistance: 4: Min assist Dynamic Standing Balance Dynamic Standing - Balance Support: During functional activity Dynamic Standing - Level of Assistance: 4: Min assist;3: Mod assist Extremity Assessment      RLE Assessment RLE Assessment: Exceptions to [facility] RLE Strength Right Hip Flexion: 4-/5 Right Knee Flexion: 4-/5 Right Knee Extension: 4-/5 Right Ankle Dorsiflexion: 4-/5 Right Ankle Plantar Flexion: 4-/5 LLE Assessment LLE Assessment: Exceptions to [facility] LLE Strength Left Hip Flexion: 4-/5 Left Knee Flexion: 4-/5 Left Knee Extension: 4-/5 Left Ankle Dorsiflexion: 4-/5 Left Ankle Plantar Flexion: 4-/5    Refer to Care Plan for Long Term Goals  Recommendations for other services: Therapeutic Recreation  Stress management  Discharge Criteria: Patient will be discharged from PT if patient refuses treatment 3 consecutive times without medical reason, if treatment goals not met, if there is a change in medical status, if patient makes no progress towards goals or if patient is discharged from hospital.  The above assessment, treatment plan, treatment alternatives and goals were discussed and mutually agreed upon: by patient  Tawana Scale, PT, DPT 11/01/2018, 7:57 AM

## 2018-11-02 ENCOUNTER — Inpatient Hospital Stay (HOSPITAL_COMMUNITY): Payer: PPO

## 2018-11-02 ENCOUNTER — Encounter (HOSPITAL_COMMUNITY): Payer: Self-pay | Admitting: Internal Medicine

## 2018-11-02 DIAGNOSIS — I69354 Hemiplegia and hemiparesis following cerebral infarction affecting left non-dominant side: Secondary | ICD-10-CM

## 2018-11-02 DIAGNOSIS — G629 Polyneuropathy, unspecified: Secondary | ICD-10-CM

## 2018-11-02 NOTE — Progress Notes (Signed)
[facility] PHYSICAL MEDICINE & REHABILITATION PROGRESS NOTE  Subjective/Complaints: Patient seen sitting up in bed this morning.  She states she slept very well overnight.  She states she had a good first day of therapies yesterday.  ROS: Denies CP, shortness of breath, nausea, vomiting, diarrhea.  Objective: Vital Signs: Blood pressure (!) 145/72, pulse 69, temperature 98.7 F (37.1 C), temperature source Oral, resp. rate 16, height 5\' 2"  (1.575 m), weight 65.2 kg, SpO2 100 %. No results found. Recent Labs    10/31/18 0531 11/01/18 0421  WBC 8.6 7.8  HGB 11.8* 11.9*  HCT 35.1* 36.0  PLT 206 216   Recent Labs    10/31/18 0531 11/01/18 0421  NA 139 140  K 3.3* 3.8  CL 105 106  CO2 26 24  GLUCOSE 101* 93  BUN 13 17  CREATININE 0.64 0.63  CALCIUM 8.5* 8.7*    Physical Exam: BP (!) 145/72 (BP Location: Right Arm)   Pulse 69   Temp 98.7 F (37.1 C) (Oral)   Resp 16   Ht 5\' 2"  (1.575 m)   Wt 65.2 kg   SpO2 100%   BMI 26.29 kg/m  Constitutional: No distress . Vital signs reviewed. HENT: Normocephalic.  Atraumatic. Eyes: EOMI.  No discharge. Cardiovascular: No JVD. Respiratory: Normal effort. GI: Non-distended. Musc: No edema or tenderness in extremities. Neurological: Alert HOH Slow processing Motor:  RUE/RLE: Grossly 4+/5, unchanged LUE/LLE: Grossly 3+/5 with apraxia Skin: Skin iswarm.  Psychiatric: Normal mood.  Normal behavior.  Assessment/Plan: 1. 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  Physiatrist is providing close team supervision and 24 hour management of active medical problems listed below.  Physiatrist and rehab team continue to assess barriers to discharge/monitor patient progress toward functional and medical goals  Care Tool:  Bathing    Body parts bathed by patient: Left arm, Chest, Front perineal area, Buttocks,  Right upper leg, Left upper leg, Face, Abdomen   Body parts bathed by helper: Right arm, Right lower leg, Left lower leg     Bathing assist Assist Level: Moderate Assistance - Patient 50 - 74%     Upper Body Dressing/Undressing Upper body dressing   What is the patient wearing?: Bra, Pull over shirt    Upper body assist Assist Level: Moderate Assistance - Patient 50 - 74%    Lower Body Dressing/Undressing Lower body dressing      What is the patient wearing?: Pants     Lower body assist Assist for lower body dressing: Maximal Assistance - Patient 25 - 49%     Toileting Toileting    Toileting assist Assist for toileting: Supervision/Verbal cueing     Transfers Chair/bed transfer  Transfers assist     Chair/bed transfer assist level: Minimal Assistance - Patient > 75%     Locomotion Ambulation   Ambulation assist      Assist level: Minimal Assistance - Patient > 75% Assistive device: No Device Max distance: 140ft   Walk 10 feet activity   Assist     Assist level: Minimal Assistance - Patient > 75% Assistive device: No Device   Walk 50 feet activity   Assist    Assist level: Minimal Assistance - Patient > 75% Assistive device: No Device    Walk 150 feet activity   Assist Walk 150 feet activity did not occur: Safety/medical concerns         Walk 10  feet on uneven surface  activity   Assist     Assist level: Minimal Assistance - Patient > 75% Assistive device: Other (comment)(no device)   Wheelchair     Assist Will patient use wheelchair at discharge?: (TBD)             Wheelchair 50 feet with 2 turns activity    Assist            Wheelchair 150 feet activity     Assist            Medical Problem List and Plan: 1.Functional deficits, left hemiplegia, 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 TEE revealed severe MR but no LA or LAA thrombus. Loop recorder placed to monitor for  rhythmic/cardiac source of stroke.  Continue CIR 2. Antithrombotics: -DVT/anticoagulation:Pharmaceutical:Lovenox -antiplatelet therapy: DAPT                         -ASA 81 mg daily and plavix daily for 3 weeks then plavix alone 3. Pain Management:Tylenol prn 4. Mood:LCSW to follow for evaluation and support. -antipsychotic agents: NA 5. Neuropsych: This patientis?fully capable of making decisions on herown behalf. 6. Skin/Wound Care:Routine pressure relief measures. 7. Fluids/Electrolytes/Nutrition:Monitor I/O. 8. OAC:[id number] BP bid--continue amlodipine and lisinopril  Relatively controlled on 8/2  Monitor with increased mobility 9. GERD: Continue PPI. 10. Neuropathy: Managed with use of gabapentin and baclofen at nights.  Stable on 8/2 11. Hypokalemia:   Potassium 3.8 on 8/1  Supplemented x2 days. 12.  Hypoalbuminemia  Supplement initiated on 8/1 13.  Acute blood loss anemia  Hemoglobin 11.9 on 8/1   Continue to monitor   LOS: 2 days A FACE TO FACE EVALUATION WAS PERFORMED   Lorie Phenix 11/02/2018, 9:29 AM

## 2018-11-02 NOTE — Plan of Care (Signed)
  Problem: RH SKIN INTEGRITY Goal: RH STG SKIN FREE OF INFECTION/BREAKDOWN Description: Healing of skin tear to left hand Outcome: Progressing Goal: RH STG ABLE TO PERFORM INCISION/WOUND CARE W/ASSISTANCE Description: STG Able To Perform Incision/Wound Care With  [employer]. Outcome: Progressing   Problem: RH SAFETY Goal: RH STG ADHERE TO SAFETY PRECAUTIONS W/ASSISTANCE/DEVICE Description: STG Adhere to Safety Precautions With  Min Assistance/Device bed alarm and chair alarm Outcome: Progressing   Problem: RH PAIN MANAGEMENT Goal: RH STG PAIN MANAGED AT OR BELOW PT'S PAIN GOAL Description: Pt will be free of pain Outcome: Progressing   Problem: RH KNOWLEDGE DEFICIT Goal: RH STG INCREASE KNOWLEDGE OF HYPERTENSION Outcome: Progressing Goal: RH STG INCREASE KNOWLEGDE OF HYPERLIPIDEMIA Outcome: Progressing Goal: RH STG INCREASE KNOWLEDGE OF STROKE PROPHYLAXIS Outcome: Progressing

## 2018-11-02 NOTE — Progress Notes (Signed)
This nurse attempted to administer the ordered Lovenox. Pt was confused as to why she was getting this medication, and stated she had not been getting it. This nurse educated pt on the risk and benefits of refusing the medication. Pt continued to refuse the medication.

## 2018-11-02 NOTE — Progress Notes (Signed)
Physical Therapy Session Note  Patient Details  Name: Morgan Boyer MRN: [id number] Date of Birth: 1931/08/06  Today's Date: 11/02/2018 PT Individual Time: 1127-1208 PT Individual Time Calculation (min): 41 min   Short Term Goals: Week 1:  PT Short Term Goal 1 (Week 1): Pt will perform bed mobility with supervision PT Short Term Goal 2 (Week 1): Pt will perform bed<>chair transfers with CGA PT Short Term Goal 3 (Week 1): Pt will ambulate at least 110ft using LRAD with CGA PT Short Term Goal 4 (Week 1): Pt will ascend/descended 4 steps using L handrail and CGA  Skilled Therapeutic Interventions/Progress Updates:   Pt resting in w/c.  Squat pivot transfer to R with CGA.  Therapeutic activity in sitting  to facilitate trunk shortening/lengthening/rotating while reahing out of BOS to R/L to retrieve cups with either hand.  Min assist needed at times for L grasp.  Gait training without AD, x 150' with min assist for balance, kicking a soccer ball with either foot.    Standing challenge, and sustained stretch for neuro re-ed, with forefeet on wedge, x 10 minutes during card matching activity, using R hand.  Hip and ankle strategies emerged during activity. Seated L lateral leans for wt bearing and forced use, x 10 x 2.  At end of session, pt seated in w/c iwht needs at hand, seat belt alarm set and set up for lunch.       Therapy Documentation Precautions:  Precautions Precautions: Fall Restrictions Weight Bearing Restrictions: No Other Position/Activity Restrictions: skin tear left hand, loop recorder surgical site chest Pain: Pain Assessment Pain Scale: 0-10 Pain Score: 0-No pain Faces Pain Scale: No hurt       Therapy/Group: Individual Therapy  Sarina Robleto 11/02/2018, 12:18 PM

## 2018-11-03 ENCOUNTER — Inpatient Hospital Stay (HOSPITAL_COMMUNITY): Payer: PPO | Admitting: Speech Pathology

## 2018-11-03 ENCOUNTER — Encounter (HOSPITAL_COMMUNITY): Payer: Self-pay | Admitting: Cardiology

## 2018-11-03 ENCOUNTER — Inpatient Hospital Stay (HOSPITAL_COMMUNITY): Payer: PPO | Admitting: Physical Therapy

## 2018-11-03 ENCOUNTER — Inpatient Hospital Stay (HOSPITAL_COMMUNITY): Payer: PPO

## 2018-11-03 DIAGNOSIS — I69354 Hemiplegia and hemiparesis following cerebral infarction affecting left non-dominant side: Principal | ICD-10-CM

## 2018-11-03 DIAGNOSIS — I6340 Cerebral infarction due to embolism of unspecified cerebral artery: Secondary | ICD-10-CM

## 2018-11-03 DIAGNOSIS — D62 Acute posthemorrhagic anemia: Secondary | ICD-10-CM

## 2018-11-03 NOTE — Progress Notes (Signed)
[facility] PHYSICAL MEDICINE & REHABILITATION PROGRESS NOTE  Subjective/Complaints: Up with SLP. Had questions about left wrist dressing. Didn't remember injuring arm  ROS: Patient denies fever, rash, sore throat, blurred vision, nausea, vomiting, diarrhea, cough, shortness of breath or chest pain, joint or back pain, headache, or mood change.     Objective: Vital Signs: Blood pressure 135/75, pulse 61, temperature (!) 97.4 F (36.3 C), resp. rate 14, height 5\' 2"  (1.575 m), weight 65.2 kg, SpO2 100 %. No results found. Recent Labs    11/01/18 0421  WBC 7.8  HGB 11.9*  HCT 36.0  PLT 216   Recent Labs    11/01/18 0421  NA 140  K 3.8  CL 106  CO2 24  GLUCOSE 93  BUN 17  CREATININE 0.63  CALCIUM 8.7*    Physical Exam: BP 135/75 (BP Location: Right Arm)   Pulse 61   Temp (!) 97.4 F (36.3 C)   Resp 14   Ht 5\' 2"  (1.575 m)   Wt 65.2 kg   SpO2 100%   BMI 26.29 kg/m  Constitutional: No distress . Vital signs reviewed. HEENT: EOMI, oral membranes moist Neck: supple Cardiovascular: RRR without murmur. No JVD    Respiratory: CTA Bilaterally without wheezes or rales. Normal effort    GI: BS +, non-tender, non-distended  Musc: No edema or tenderness in extremities. Neurological: Alert HOH Slow processing Motor:  RUE/RLE: Grossly 4+/5, unchanged LUE/LLE: Grossly 3+/5 with apraxia Skin: 3cm skin tear left wrist. Minimal drainage  Psychiatric: Normal mood.  Normal behavior.  Assessment/Plan: 1. 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  Physiatrist is providing close team supervision and 24 hour management of active medical problems listed below.  Physiatrist and rehab team continue to assess barriers to discharge/monitor patient progress toward functional and medical goals  Care Tool:  Bathing    Body parts bathed by patient: Left arm, Chest,  Front perineal area, Buttocks, Right upper leg, Left upper leg, Face, Abdomen   Body parts bathed by helper: Right arm, Right lower leg, Left lower leg     Bathing assist Assist Level: Moderate Assistance - Patient 50 - 74%     Upper Body Dressing/Undressing Upper body dressing   What is the patient wearing?: Bra, Pull over shirt    Upper body assist Assist Level: Moderate Assistance - Patient 50 - 74%    Lower Body Dressing/Undressing Lower body dressing      What is the patient wearing?: Pants     Lower body assist Assist for lower body dressing: Maximal Assistance - Patient 25 - 49%     Toileting Toileting    Toileting assist Assist for toileting: Supervision/Verbal cueing     Transfers Chair/bed transfer  Transfers assist     Chair/bed transfer assist level: Minimal Assistance - Patient > 75%     Locomotion Ambulation   Ambulation assist      Assist level: Minimal Assistance - Patient > 75% Assistive device: No Device Max distance: 129ft   Walk 10 feet activity   Assist     Assist level: Minimal Assistance - Patient > 75% Assistive device: No Device   Walk 50 feet activity   Assist    Assist level: Minimal Assistance - Patient > 75% Assistive device: No Device    Walk 150 feet activity   Assist Walk 150 feet activity did not occur: Safety/medical concerns  Walk 10 feet on uneven surface  activity   Assist     Assist level: Minimal Assistance - Patient > 75% Assistive device: Other (comment)(no device)   Wheelchair     Assist Will patient use wheelchair at discharge?: (TBD)             Wheelchair 50 feet with 2 turns activity    Assist            Wheelchair 150 feet activity     Assist            Medical Problem List and Plan: 1.Functional deficits, left hemiplegia, 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 TEE revealed severe MR but no LA or LAA thrombus. Loop  recorder placed to monitor for rhythmic/cardiac source of stroke.  Continue CIR therapies, SLP, OT, PT 2. Antithrombotics: -DVT/anticoagulation:Pharmaceutical:Lovenox -antiplatelet therapy: DAPT                         -ASA 81 mg daily and plavix daily for 3 weeks then plavix alone 3. Pain Management:Tylenol prn 4. Mood:LCSW to follow for evaluation and support. -antipsychotic agents: NA 5. Neuropsych: This patientis?fully capable of making decisions on herown behalf. 6. Skin/Wound Care:Routine pressure relief measures.  -local care to left wrist.  7. Fluids/Electrolytes/Nutrition:Monitor I/O. 8. RWE:RXVQMGQ BP bid--continue amlodipine and lisinopril  Controlled on 8/3  Monitor with increased mobility 9. GERD: Continue PPI. 10. Neuropathy: Managed with use of gabapentin and baclofen at nights.  Stable on 8/2 11. Hypokalemia:   Potassium 3.8 on 8/1  Supplemented x2 days. 12.  Hypoalbuminemia  Supplement initiated on 8/1 13.  Acute blood loss anemia  Hemoglobin 11.9 on 8/1   Continue to monitor   LOS: 3 days A FACE TO FACE EVALUATION WAS PERFORMED  Meredith Staggers 11/03/2018, 10:24 AM

## 2018-11-03 NOTE — Progress Notes (Signed)
Social Work Assessment and Plan   Patient Details  Name: Morgan Boyer MRN: [id number] Date of Birth: December 06, 1931  Today's Date: 11/03/2018  Problem List:  Patient Active Problem List   Diagnosis Date Noted  . Hemiparesis affecting left side as late effect of stroke ([place])   . Neuropathy   . Acute blood loss anemia   . Hypoalbuminemia due to protein-calorie malnutrition ([place])   . Embolic stroke ([place]) 93/57/0177  . Esophageal stricture 10/30/2018  . Family hx-stroke 10/27/2018  . Advanced age 80/27/2020  . Stroke (cerebrum) (HCC)-R MCA & L MCA infarcts, embolic, source unknown 93/90/3009  . Thrombocytopenia ([place])   . Essential hypertension   . Hyperlipidemia   . Hypokalemia   . Lumbar stenosis with neurogenic claudication 07/03/2017  . Near syncope 11/10/2014  . Nocturnal leg cramps 07/15/2014  . Abnormality of gait 07/15/2014  . Polyneuropathy in other diseases classified elsewhere ([name]) 01/14/2013  . Memory changes 01/14/2013   Past Medical History:  Past Medical History:  Diagnosis Date  . Abnormality of gait   . Adenomatous colon polyp   . Arthritis   . Back pain   . Cancer ([place])    skin cancers  . Depression   . Diverticulosis   . DJD (degenerative joint disease) of knee   . Dyslipidemia   . Esophageal reflux   . H/O hiatal hernia   . Heart murmur   . Herpes zoster    throat  . History of shingles    left throat 07/2007  . Hyperlipidemia   . Hypertension   . Memory changes 01/14/2013  . Neuromuscular disorder ([place])    peripheral neuropathy  . Nocturnal leg cramps 07/15/2014  . Peripheral neuropathy   . Polyneuropathy in other diseases classified elsewhere ([place]) 01/14/2013   Past Surgical History:  Past Surgical History:  Procedure Laterality Date  . ABDOMINAL HYSTERECTOMY    . APPENDECTOMY    . BACK SURGERY  07/2017  . BUBBLE STUDY  10/31/2018   Procedure: BUBBLE STUDY;  Surgeon: Fay Records, MD;  Location: [facility] ENDOSCOPY;  Service: Cardiovascular;;   . COLON SURGERY     2001 diverticulitis with infection ..drained surgically .  Marland Kitchen EYE SURGERY     cat ext bil   . JOINT REPLACEMENT     right  . KNEE ARTHROSCOPY  11/08/2011  . LOOP RECORDER INSERTION N/A 10/31/2018   Procedure: LOOP RECORDER INSERTION;  Surgeon: Constance Haw, MD;  Location: [facility];  Service: Cardiovascular;  Laterality: N/A;  . TEE WITHOUT CARDIOVERSION N/A 10/31/2018   Procedure: TRANSESOPHAGEAL ECHOCARDIOGRAM (TEE);  Surgeon: Fay Records, MD;  Location: [facility] ENDOSCOPY;  Service: Cardiovascular;  Laterality: N/A;  . TONSILLECTOMY    . TOTAL KNEE ARTHROPLASTY  11/07/2011   Procedure: TOTAL KNEE ARTHROPLASTY;  Surgeon: Ninetta Lights, MD;  Location: [facility];  Service: Orthopedics;  Laterality: Left;  . TUBAL LIGATION    . WRIST SURGERY     for skin cancer   Social History:  reports that she has never smoked. She has never used smokeless tobacco. She reports that she does not drink alcohol or use drugs.  Family / Support Systems Marital Status: Widow/Widower How Long?: 5 years Patient Roles: Parent, Other (Comment)(grandparent; great grandparent; sister; friend; neighbor) Children: [name] - son - [phone]; another adult son and an adult dtr Other Supports: sister, friends, neighbors Anticipated Caregiver: son, Nicole Kindred, and sister, Judson Roch Ability/Limitations of Caregiver: supervision Caregiver Availability: 24/7 Family Dynamics:  close, supportive family  Social History Preferred language: English Religion: Methodist Read: Yes Write: Yes Employment Status: Retired Date Retired/Disabled/Unemployed: 2000 Age Retired: 80 Public relations account executive Issues: none reported Guardian/Conservator: MD continues to assess if pt is capable of making her own decisions.  Her son, Nicole Kindred, has POA and would be decision maker in the event that pt cannot be.   Abuse/Neglect Abuse/Neglect Assessment Can Be Completed: Yes Physical Abuse:  Denies Verbal Abuse: Denies Sexual Abuse: Denies Exploitation of patient/patient's resources: Denies Self-Neglect: Denies  Emotional Status Pt's affect, behavior and adjustment status: Pt was upbeat and laughed with CSW.  She is motivated to work hard and to get back home. Recent Psychosocial Issues: Pt with a couple of falls in her home in the last year or so. Psychiatric History: none reported Substance Abuse History: none reported  Patient / Family Perceptions, Expectations & Goals Pt/Family understanding of illness & functional limitations: Pt/son have a good understanding of pt's condition, limitations, and d/c needs. Premorbid pt/family roles/activities: Pt enjoys reading and completing all kinds of word puzzles.  She still drives and goes out to run errands.  Pt has some friends/neighbors she keeps up with, too. Anticipated changes in roles/activities/participation: Pt would like to resume above activities as she is able. Pt/family expectations/goals: Pt wants to get better and be able to care for herself at home.  Community [employer] Agencies: None Premorbid Home Care/DME Agencies: Other (Comment)(Pt has a rollator (without seat), hand held shower head with grab bars, higher toilet, BSC) Transportation available at discharge: family Resource referrals recommended: Neuropsychology, Support group (specify)(stroke support group)  Discharge Planning Living Arrangements: Alone Support Systems: Children, Other relatives, Friends/neighbors Type of Residence: Private residence Insurance Resources: Multimedia programmer (specify)([employer] Advantage) Financial Resources: Radio broadcast assistant Screen Referred: No Money Management: Patient Does the patient have any problems obtaining your medications?: No Home Management: Pt takes care of most of her inside chores.  She has a neighbor who helps her vacuum and mop her floors. Patient/Family Preliminary Plans: Pt pland to  return to her home with her family to provide supervision. Social Work Anticipated Follow Up Needs: HH/OP, Support Group Expected length of stay: 1 to 2 weeks  Clinical Impression CSW met with pt to introduce self and role of CSW, as well as to complete assessment.  CSW later spoke with pt's son, Nicole Kindred, to do the same.  Pt was very talkative with CSW and reports feeling well emotionally and is hopeful about her recovery.  She did admit that she thought she ws doing really well and then was given the BERG balance test with PT and realized she had poor balance.  She was surprised, but not discouraged.  She feels she can come back from this.  Pt's family is still working on a plan to provide pt supervision at home.  No current concerns/needs/questions at this point, but CSW will continue to follow and assist as needed.    Saamir Armstrong, Silvestre Mesi 11/03/2018, 11:10 PM

## 2018-11-03 NOTE — Progress Notes (Signed)
Speech Language Pathology Daily Session Note  Patient Details  Name: Morgan Boyer MRN: [id number] Date of Birth: 1931-12-31  Today's Date: 11/03/2018 SLP Individual Time: 0825-0925 SLP Individual Time Calculation (min): 60 min  Short Term Goals: Week 1: SLP Short Term Goal 1 (Week 1): Patient will demonstrate funtional problem solving for mildly complex tasks with min verbal cues SLP Short Term Goal 2 (Week 1): Patient will demonstrate recent (10 minute or greater) recall of novel infomration with Min A verbal c ues with use of compenstaory strategies. SLP Short Term Goal 3 (Week 1): Patient will complete medication and money management tasks with supervision.  Skilled Therapeutic Interventions: Skilled treatment session focused on cognitive goals. SLP facilitated session by providing Min A verbal cues for recall of her current medications and their functions. Patient organized a BID pill box with overall supervision level verbal cues for problem solving with task. Patient also educated in importance of taking current medications as prescribed (declining Lovenox shot). Patient verbalized understanding and reported she thought that the "liquid medication" was taking the place of the shot but SLP explained that the "liquid medication" she was referring to was a protein supplement. She verbalized understanding and reported she will take all medications as prescribed. Patient left upright in wheelchair with all needs within reach and alarm on. Continue with current plan of care.      Pain Pain Assessment Pain Scale: 0-10 Pain Score: 0-No pain  Therapy/Group: Individual Therapy  Florine Sprenkle 11/03/2018, 10:04 AM

## 2018-11-03 NOTE — Plan of Care (Signed)
  Problem: RH SKIN INTEGRITY Goal: RH STG SKIN FREE OF INFECTION/BREAKDOWN Description: Healing of skin tear to left hand Outcome: Progressing Goal: RH STG ABLE TO PERFORM INCISION/WOUND CARE W/ASSISTANCE Description: STG Able To Perform Incision/Wound Care With  [employer]. Outcome: Progressing   Problem: RH SAFETY Goal: RH STG ADHERE TO SAFETY PRECAUTIONS W/ASSISTANCE/DEVICE Description: STG Adhere to Safety Precautions With  Min Assistance/Device bed alarm and chair alarm Outcome: Progressing   Problem: RH PAIN MANAGEMENT Goal: RH STG PAIN MANAGED AT OR BELOW PT'S PAIN GOAL Description: Pt will be free of pain Outcome: Progressing

## 2018-11-03 NOTE — Progress Notes (Signed)
Occupational Therapy Session Note  Patient Details  Name: Morgan Boyer MRN: [id number] Date of Birth: 1931/11/13  Today's Date: 11/03/2018 OT Individual Time: 1030-1130 OT Individual Time Calculation (min): 60 min    Short Term Goals: Week 1:  OT Short Term Goal 1 (Week 1): patient will complete UB bathing and dressing with CS/set up, LB bathing and dressing with min A OT Short Term Goal 2 (Week 1): patient will perform functional transfers with CGA/CS OT Short Term Goal 3 (Week 1): patient will increase use of left UE to perform bathing/grooming using L UE as functional assist  Skilled Therapeutic Interventions/Progress Updates:    Pt received sitting up in w/c with no c/o pain. Pt requesting to take shower and wash hair. Skin tear on L hand occluded for shower d/t bandage recently being changed. Pt completed stand pivot transfer into shower with CGA. Pt required min A to doff nightgown, cueing provided for technique. Pt sat on TTB and completed sit <> stands with CGA, use of grab bars. Pt washed hair with set up assist, cueing provided to increase use of LUE. Provided edu re NMR and importance of forced use. Pt washed all UB and LB without assistance, except balance support in standing. Pt transferred out of shower and sat at sink to complete grooming tasks. Pt donned bra with min A d/t tight closure. Slight motor planning deficits observed. Pt donned shirt with (S). Min A to don pants, to thread RLe through leg. Pt was left sitting up in her w/c with all needs met, chair alarm belt fastened.   Therapy Documentation Precautions:  Precautions Precautions: Fall Restrictions Weight Bearing Restrictions: No Other Position/Activity Restrictions: skin tear left hand, loop recorder surgical site chest   Therapy/Group: Individual Therapy  Curtis Sites 11/03/2018, 7:02 AM

## 2018-11-03 NOTE — IPOC Note (Signed)
Overall Plan of Care [facility]) Patient Details Name: DEZTINY SARRA MRN: [id number] DOB: [birth year]  Admitting Diagnosis: Embolic stroke [facility])  Hospital Problems: Principal Problem:   Embolic stroke [facility]) Active Problems:   Acute blood loss anemia   Hypoalbuminemia due to protein-calorie malnutrition (HCC)   Hemiparesis affecting left side as late effect of stroke ([place])   Neuropathy     Functional Problem List: Nursing Endurance, Skin Integrity, Medication Management, Safety  PT Balance, Safety, Sensory, Endurance, Motor  OT Balance, Endurance, Motor  SLP Cognition, Safety  TR         Basic ADL's: OT Eating, Grooming, Bathing, Dressing, Toileting     Advanced  ADL's: OT Simple Meal Preparation, Light Housekeeping     Transfers: PT Bed Mobility, Bed to Chair, Car, Manufacturing systems engineer, Metallurgist: PT Ambulation, Emergency planning/management officer, Stairs     Additional Impairments: OT Fuctional Use of Upper Extremity  SLP None      TR      Anticipated Outcomes Item Anticipated Outcome  Self Feeding independent  Swallowing      Basic self-care  S/mod I  Toileting  mod I   Bathroom Transfers S/mod I  Bowel/Bladder  continent  Transfers  supervision  Locomotion  supervision using LRAD  Communication     Cognition  min A  Pain  controlled with tylenol  Safety/Judgment  forgetful   Therapy Plan: PT Intensity: Minimum of 1-2 x/day ,45 to 90 minutes PT Frequency: 5 out of 7 days PT Duration Estimated Length of Stay: 2 weeks OT Intensity: Minimum of 1-2 x/day, 45 to 90 minutes OT Frequency: 5 out of 7 days OT Duration/Estimated Length of Stay: 2 weeks SLP Intensity: Minumum of 1-2 x/day, 30 to 90 minutes SLP Frequency: 3 to 5 out of 7 days SLP Duration/Estimated Length of Stay: 5-7 days   Due to the current state of emergency, patients may not be receiving their 3-hours of Medicare-mandated therapy.   Team Interventions: Nursing Interventions  Patient/Family Education, Skin Care/Wound Management, Dysphagia/Aspiration Precaution Training, Discharge Planning, Disease Management/Prevention  PT interventions Ambulation/gait training, Community reintegration, Neuromuscular re-education, DME/adaptive equipment instruction, Psychosocial support, UE/LE Strength taining/ROM, Stair training, Wheelchair propulsion/positioning, Training and development officer, Discharge planning, Functional electrical stimulation, Pain management, Skin care/wound management, Therapeutic Activities, UE/LE Coordination activities, Cognitive remediation/compensation, Disease management/prevention, Functional mobility training, Patient/family education, Splinting/orthotics, Therapeutic Exercise, Visual/perceptual remediation/compensation  OT Interventions Balance/vestibular training, Neuromuscular re-education, Self Care/advanced ADL retraining, Therapeutic Exercise, DME/adaptive equipment instruction, UE/LE Strength taining/ROM, Academic librarian, Barrister's clerk education, UE/LE Coordination activities, Discharge planning, Functional mobility training, Therapeutic Activities  SLP Interventions Cognitive remediation/compensation, Therapeutic Activities, Therapeutic Exercise, Internal/external aids  TR Interventions    SW/CM Interventions Discharge Planning, Psychosocial Support, Patient/Family Education   Barriers to Discharge MD  Medical stability  Nursing      PT Inaccessible home environment, Home environment access/layout, Lack of/limited family support, Decreased caregiver support    OT      SLP      SW       Team Discharge Planning: Destination: PT-Home ,OT- Home , SLP-Home Projected Follow-up: PT-24 hour supervision/assistance, Home health PT, OT-  Home health OT, SLP-Home Health SLP Projected Equipment Needs: PT-To be determined, OT- To be determined, SLP-None recommended by SLP Equipment Details: PT-has rollator, OT-patient owns rollator, tub seat,  handicapped height toilet, grab bars in bathroom Patient/family involved in discharge planning: PT- Patient,  OT-Patient, SLP-Patient  MD ELOS: 12-14 days Medical Rehab Prognosis:  Excellent Assessment: The patient  has been admitted for CIR therapies with the diagnosis of bi-cerebral embolic infarcts. The team will be addressing functional mobility, strength, stamina, balance, safety, adaptive techniques and equipment, self-care, bowel and bladder mgt, patient and caregiver education, NMR, cognition, communication. Goals have been set at mod I to supervision with self-care and mobility and min assist with cognition.   Due to the current state of emergency, patients may not be receiving their 3 hours per day of Medicare-mandated therapy.    Meredith Staggers, MD, FAAPMR      See Team Conference Notes for weekly updates to the plan of care

## 2018-11-03 NOTE — Progress Notes (Signed)
Inpatient [facility] Individual Statement of Services  Patient Name:  Morgan Boyer  Date:  11/03/2018  Welcome to the [facility].  Our goal is to provide you with an individualized program based on your diagnosis and situation, designed to meet your specific needs.  With this comprehensive rehabilitation program, you will be expected to participate in at least 3 hours of rehabilitation therapies Monday-Friday, with modified therapy programming on the weekends.  Your rehabilitation program will include the following services:  Physical Therapy (PT), Occupational Therapy (OT), Speech Therapy (ST), 24 hour per day rehabilitation nursing, Case Management (Social Worker), Rehabilitation Medicine, Nutrition Services and Pharmacy Services  Weekly team conferences will be held on Tuesdays to discuss your progress.  Your Social Worker will talk with you frequently to get your input and to update you on team discussions.  Team conferences with you and your family in attendance may also be held.  Expected length of stay:  2 weeks  Overall anticipated outcome:  Supervision  Depending on your progress and recovery, your program may change. Your Social Worker will coordinate services and will keep you informed of any changes. Your Social Worker's name and contact numbers are listed  below.  The following services may also be recommended but are not provided by the [facility] will be made to provide these services after discharge if needed.  Arrangements include referral to agencies that provide these services.  Your insurance has been verified to be:  [employer]age Your primary doctor is:  Dr. Leighton Ruff  Pertinent information will be shared with your doctor and your insurance company.  Social Worker:  Alfonse Alpers, LCSW   [phone] or [phone]  Information discussed with and copy given to patient by: Trey Sailors, 11/03/2018, 3:39 PM

## 2018-11-03 NOTE — Progress Notes (Signed)
Inpatient Rehabilitation  Patient information reviewed and entered into eRehab system by Quinzell Malcomb M. Tremond Shimabukuro, M.A., CCC/SLP, PPS Coordinator.  Information including medical coding, functional ability and quality indicators will be reviewed and updated through discharge.    

## 2018-11-03 NOTE — Progress Notes (Signed)
Physical Therapy Session Note  Patient Details  Name: Morgan Boyer MRN: [id number] Date of Birth: 12-05-1931  Today's Date: 11/03/2018 PT Individual Time: 1300-1415 PT Individual Time Calculation (min): 75 min   Short Term Goals: Week 1:  PT Short Term Goal 1 (Week 1): Pt will perform bed mobility with supervision PT Short Term Goal 2 (Week 1): Pt will perform bed<>chair transfers with CGA PT Short Term Goal 3 (Week 1): Pt will ambulate at least 131ft using LRAD with CGA PT Short Term Goal 4 (Week 1): Pt will ascend/descended 4 steps using L handrail and CGA  Skilled Therapeutic Interventions/Progress Updates:  Pt received in w/c & agreeable to tx. Pt provides PLOF & home set up information & reports her children work & her sister can assist her at d/c - therapist educated her on supervision level goals. Pt transfers sit<>stand with CGA and ambulates into bathroom with CGA<>min assist, manages clothing with extra time without assistance, completes toilet transfer with CGA, and has continent void on toilet. Pt performs hand hygiene with decreased functional use of LUE and cuing to use soap. Pt dons housecoat with CGA for balance. Transported pt to/from gym via w/c dependent assist for time management. Pt completed Berg Balance Test & scored 31/56; educated pt on interpretation of score & current fall risk. Patient demonstrates increased fall risk as noted by score of 31/56 on Berg Balance Scale.  (<36= high risk for falls, close to 100%; 37-45 significant >80%; 46-51 moderate >50%; 52-55 lower >25%). Pt ambulates 200 ft without AD & CGA with L hip flexor weakness during LLE swing phase. Pt performs lateral step ups on 6" step with BUE support and min assist with task focusing on LLE hip flexor/adductor strengthening. At end of session pt returns to supine with supervision, bed flat, no rails. Pt left in bed with alarm set & all needs at hand.   Therapy Documentation Precautions:   Precautions Precautions: Fall Restrictions Weight Bearing Restrictions: No Other Position/Activity Restrictions: skin tear left hand, loop recorder surgical site chest  Pain: Pt denies c/o pain.   Balance: Balance Balance Assessed: Yes Standardized Balance Assessment Standardized Balance Assessment: Berg Balance Test Berg Balance Test Sit to Stand: Able to stand without using hands and stabilize independently Standing Unsupported: Able to stand 2 minutes with supervision Sitting with Back Unsupported but Feet Supported on Floor or Stool: Able to sit safely and securely 2 minutes Stand to Sit: Sits safely with minimal use of hands Transfers: Able to transfer with verbal cueing and /or supervision Standing Unsupported with Eyes Closed: Able to stand 10 seconds with supervision Standing Ubsupported with Feet Together: Needs help to attain position but able to stand for 30 seconds with feet together From Standing, Reach Forward with Outstretched Arm: Can reach forward >5 cm safely (2") From Standing Position, Pick up Object from Floor: Able to pick up shoe, needs supervision From Standing Position, Turn to Look Behind Over each Shoulder: Needs supervision when turning Turn 360 Degrees: Needs close supervision or verbal cueing Standing Unsupported, Alternately Place Feet on Step/Stool: Needs assistance to keep from falling or unable to try(declined attempting task) Standing Unsupported, One Foot in Front: Able to plae foot ahead of the other independently and hold 30 seconds Standing on One Leg: Unable to try or needs assist to prevent fall(pt elects to attempt to stand on LLE) Total Score: 31    Therapy/Group: Individual Therapy  Waunita Schooner 11/03/2018, 2:19 PM

## 2018-11-04 ENCOUNTER — Inpatient Hospital Stay (HOSPITAL_COMMUNITY): Payer: PPO | Admitting: Occupational Therapy

## 2018-11-04 ENCOUNTER — Inpatient Hospital Stay (HOSPITAL_COMMUNITY): Payer: PPO | Admitting: Speech Pathology

## 2018-11-04 ENCOUNTER — Inpatient Hospital Stay (HOSPITAL_COMMUNITY): Payer: PPO

## 2018-11-04 ENCOUNTER — Encounter (HOSPITAL_COMMUNITY): Payer: PPO | Admitting: Psychology

## 2018-11-04 ENCOUNTER — Inpatient Hospital Stay (HOSPITAL_COMMUNITY): Payer: PPO | Admitting: Physical Therapy

## 2018-11-04 NOTE — Progress Notes (Signed)
Occupational Therapy Session Note  Patient Details  Name: Morgan Boyer MRN: [id number] Date of Birth: [birth year]  Today's Date: 11/04/2018 OT Individual Time: 1134-1206 OT Individual Time Calculation (min): 32 min    Short Term Goals: Week 1:  OT Short Term Goal 1 (Week 1): patient will complete UB bathing and dressing with CS/set up, LB bathing and dressing with min A OT Short Term Goal 2 (Week 1): patient will perform functional transfers with CGA/CS OT Short Term Goal 3 (Week 1): patient will increase use of left UE to perform bathing/grooming using L UE as functional assist  Skilled Therapeutic Interventions/Progress Updates:     Patient seated in w/c, ready for therapy session. Ambulation to/from therapy gym with hand hold assist, no LOB.  Unsupported sitting with DS.  Completed box and blocks assessment:  R = 42, L = 20, 9 hole peg test:  R = 29.4 sec, L = 2 min 10 sec.    Completed UB conditioning activities - noted wrist fatigue with repetition.  Patient returned to w/c at close of session with seat belt alarm set and call bell in reach.    Therapy Documentation Precautions:  Precautions Precautions: Fall Restrictions Weight Bearing Restrictions: No Other Position/Activity Restrictions: skin tear left hand, loop recorder surgical site chest General:   Vital Signs: Therapy Vitals BP: 132/86 Pain: Pain Assessment Pain Scale: 0-10 Pain Score: 0-No pain   Other Treatments:     Therapy/Group: Individual Therapy  Carlos Levering 11/04/2018, 12:26 PM

## 2018-11-04 NOTE — Progress Notes (Signed)
Occupational Therapy Session Note  Patient Details  Name: Morgan Boyer MRN: [id number] Date of Birth: June 21, 1931  Today's Date: 11/04/2018 OT Individual Time: 0815-0910 OT Individual Time Calculation (min): 55 min    Short Term Goals: Week 1:  OT Short Term Goal 1 (Week 1): patient will complete UB bathing and dressing with CS/set up, LB bathing and dressing with min A OT Short Term Goal 2 (Week 1): patient will perform functional transfers with CGA/CS OT Short Term Goal 3 (Week 1): patient will increase use of left UE to perform bathing/grooming using L UE as functional assist  Skilled Therapeutic Interventions/Progress Updates:    Pt resting in w/c upon arrival and agreeable to participating in therapy.  Pt commented that yesterday was a busy and "hard" day but "it was good." Pt declined shower this morning and opted to bathe at sink.  Pt amb with HHA in room to gather clothing before standing at sink to complete bathing tasks (except when sitting to bathe feet). Pt stood for approx 13 mins at sink during bathing and UB dressing tasks. Pt initiates and uses LUE appropriately during all tasks.  Pt able to clasp front closure bra without assistance this morning.  Pt did required assistance threading RLE into pants but completed all other dressing tasks without assitance.  Pt requires more than a reasonable amount of time to complete tasks. Pt amb in Day room and hallway with HHA/CGA while holding towel in LUE. Pt returned to room and remained seated in w/c with belt alarm activated and all needs within reach.   Therapy Documentation Precautions:  Precautions Precautions: Fall Restrictions Weight Bearing Restrictions: No Other Position/Activity Restrictions: skin tear left hand, loop recorder surgical site chest Pain:  Pt denies pain this morning   Therapy/Group: Individual Therapy  Leroy Libman 11/04/2018, 9:14 AM

## 2018-11-04 NOTE — Progress Notes (Signed)
Occupational Therapy Session Note  Patient Details  Name: Morgan Boyer MRN: [id number] Date of Birth: 1932/02/07  Today's Date: 11/04/2018 OT Individual Time: 1400-1430 OT Individual Time Calculation (min): 30 min    Short Term Goals: Week 1:  OT Short Term Goal 1 (Week 1): patient will complete UB bathing and dressing with CS/set up, LB bathing and dressing with min A OT Short Term Goal 2 (Week 1): patient will perform functional transfers with CGA/CS OT Short Term Goal 3 (Week 1): patient will increase use of left UE to perform bathing/grooming using L UE as functional assist  Skilled Therapeutic Interventions/Progress Updates:    OT intervention with focus on functional amb without AD, standing balance, and increased LUE function/strength.  Pt amb without AD to gym and engaged in LUE tasks-placing clothes pins on UE ladder while standing and folding towels. LUE with decreased [place] and distal strength. Pt completed tasks with more than a reasonable amount of time. Pt returned to room and remained in w/c with all needs within reach and belt alarm activated.   Therapy Documentation Precautions:  Precautions Precautions: Fall Restrictions Weight Bearing Restrictions: No Other Position/Activity Restrictions: skin tear left hand, loop recorder surgical site chest Pain: Pain Assessment Pain Scale: 0-10 Pain Score: 0-No pain    Therapy/Group: Individual Therapy  Leroy Libman 11/04/2018, 2:43 PM

## 2018-11-04 NOTE — Patient Care Conference (Signed)
Inpatient RehabilitationTeam Conference and Plan of Care Update Date: 11/04/2018   Time: 2:40 PM    Patient Name: Morgan Boyer      Medical Record Number: [id number]  Date of Birth: 02-21-1932 Sex: Female         Room/Bed: 4W15C/4W15C-01 Payor Info: Payor: [employer] / Plan: [employer] Must PPO / Product Type: *No Product type* /    Admitting Diagnosis: 2. TBI Team  B CVA; 28-15days  Admit Date/Time:  10/31/2018  7:51 PM Admission Comments: No comment available   Primary Diagnosis:  Embolic stroke [facility]) Principal Problem: Embolic stroke [facility])  Patient Active Problem List   Diagnosis Date Noted  . Hemiparesis affecting left side as late effect of stroke ([place])   . Neuropathy   . Acute blood loss anemia   . Hypoalbuminemia due to protein-calorie malnutrition ([place])   . Embolic stroke ([place]) 50/06/7046  . Esophageal stricture 10/30/2018  . Family hx-stroke 10/27/2018  . Advanced age 41/27/2020  . 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 Thrombocytopenia ([place])   . Essential hypertension   . Hyperlipidemia   . Hypokalemia   . Lumbar stenosis with neurogenic claudication 07/03/2017  . Near syncope 11/10/2014  . Nocturnal leg cramps 07/15/2014  . Abnormality of gait 07/15/2014  . Polyneuropathy in other diseases classified elsewhere ([place]) 01/14/2013  . Memory changes 01/14/2013    Expected Discharge Date: Expected Discharge Date: 11/11/18  Team Members Present: Physician leading conference: Dr. Alger Simons Social Worker Present: Alfonse Alpers, LCSW;Azizi Bally [place], [place] Nurse Present: Rozetta Nunnery, RN PT Present: Lavone Nian, PT OT Present: Laverle Hobby, OT SLP Present: Weston Anna, SLP PPS Coordinator present : Gunnar Fusi, SLP     Current Status/Progress Goal Weekly Team Focus  Medical   embolic CVA, right more than left. LUE affected, afib, has LR in place. HOH, bp better  stabilize medical issues for  discharge  stroke education, CV mgt, wound care   Bowel/Bladder   Pt is continent of b/b, lbm 11/03/18  Remain continent of B/B  Q2h/PRN toileting   Swallow/Nutrition/ Hydration             ADL's   bathing/dressing-CGA/min A; using LUE functionally at diminished level with decreased [place] and strength distally  mod I overall except supervision for LB dressing and IADLs  activity tolerance, standing balance, safety awareness, education, LUE function, IADL training   Mobility   CGA sit<>stand, CGA<>min assist gait without AD, CGA standing balance, 31/56 on Berg on 8/3  supervision overall  transfers, gait, balance, stair negotiation, L NMR, strengthening, d/c planning   Communication             Safety/Cognition/ Behavioral Observations  Supervision-Min A  Min A  recall with use of strategies, complex problem solving   Pain   pt c/o 4/10 pain with back/ legs.  pt wil be pain freee  Assess pain Qshift/PRN   Skin   Skin tear on L hand with dressing in place.  Remain free of infection and free of skin breakdown  assess skin Qshift/PRN    Rehab Goals Patient on target to meet rehab goals: Yes Rehab Goals Revised: none *See Care Plan and progress notes for long and short-term goals.     Barriers to Discharge  Current Status/Progress Possible Resolutions Date Resolved   Physician    Medical stability        supervision at home. ongoing medical mgt as outlined in medical progress  notes      Nursing                  PT  Lack of/limited family support;Decreased caregiver support  unsure if pt has supervision at d/c.              OT                  SLP                SW                Discharge Planning/Teaching Needs:  Pt to return to her home with her family working out 24/7 supervision.  Son said a family member will come in for education closer to pt's d/c.   Team Discussion:  No significant medical issues; CGA - min assist overall with B/D;  Poor fine motor control;   Supervision goals.  CGA with mobility and supervision goals.  Mild cognitive impairments.  Family coordinating 24/7 support.  Revisions to Treatment Plan:  NA    Continued Need for Acute Rehabilitation Level of Care: The patient requires daily medical management by a physician with specialized training in physical medicine and rehabilitation for the following conditions: Daily direction of a multidisciplinary physical rehabilitation program to ensure safe treatment while eliciting the highest outcome that is of practical value to the patient.: Yes Daily medical management of patient stability for increased activity during participation in an intensive rehabilitation regime.: Yes Daily analysis of laboratory values and/or radiology reports with any subsequent need for medication adjustment of medical intervention for : Mood/behavior problems;Wound care problems;Cardiac problems   I attest that I was present, lead the team conference, and concur with the assessment and plan of the team.   Lennart Pall 11/04/2018, 4:15 PM    Team conference was held via web/ teleconference due to [place] - 19

## 2018-11-04 NOTE — Progress Notes (Addendum)
Physical Therapy Session Note  Patient Details  Name: MCKENNAH KRETCHMER MRN: [id number] Date of Birth: 1932/01/17  Today's Date: 11/04/2018 PT Individual Time: 3606-7703 PT Individual Time Calculation (min): 41 min   Short Term Goals: Week 1:  PT Short Term Goal 1 (Week 1): Pt will perform bed mobility with supervision PT Short Term Goal 2 (Week 1): Pt will perform bed<>chair transfers with CGA PT Short Term Goal 3 (Week 1): Pt will ambulate at least 116ft using LRAD with CGA PT Short Term Goal 4 (Week 1): Pt will ascend/descended 4 steps using L handrail and CGA  Skilled Therapeutic Interventions/Progress Updates:  Pt received in w/c & agreeable to tx. No c/o pain reported. Pt ambulates around unit without AD & CGA fade to close supervision with min cuing for obstacle avoidance. Discussed stair negotiation and practiced negotiating stairs in various manners with L rail with pt reporting increased comfort when negotiating stairs laterally with L rail & pt negotiating 12 steps in this manner with close supervision after first few practice trials. Pt utilized dynavision while standing on compliant surface with CGA<>min assist for balance, 2 trials, 2 minutes each, first with RUE then LUE with focus on standing balance then LUE NMR through forced use with pt demonstrating impaired FMC of LUE. Pt asking about stroke information & therapist provided brief education re: stroke types, risk factors, recovery, & need to follow MD recommendations (ex: take meds as prescribed), encouraged to wear life alert bracelet or carry phone on her at all times at home, & oriented pt to stroke notebook & encouraged pt to read information. Pt left in w/c with chair alarm donned & all needs at hand.   Therapy Documentation Precautions:  Precautions Precautions: Fall Restrictions Weight Bearing Restrictions: No Other Position/Activity Restrictions: skin tear left hand, loop recorder surgical site chest   Therapy/Group:  Individual Therapy  Waunita Schooner 11/04/2018, 1:51 PM

## 2018-11-04 NOTE — Progress Notes (Signed)
[place] PHYSICAL MEDICINE & REHABILITATION PROGRESS NOTE  Subjective/Complaints: Feels she's improving. Anxious to get home!  ROS: Patient denies fever, rash, sore throat, blurred vision, nausea, vomiting, diarrhea, cough, shortness of breath or chest pain, joint or back pain, headache, or mood change.      Objective: Vital Signs: Blood pressure 132/86, pulse 77, temperature 98.2 F (36.8 C), resp. rate 18, height 5\' 2"  (1.575 m), weight 65.2 kg, SpO2 100 %. No results found. No results for input(s): WBC, HGB, HCT, PLT in the last 72 hours. No results for input(s): NA, K, CL, CO2, GLUCOSE, BUN, CREATININE, CALCIUM in the last 72 hours.  Physical Exam: BP 132/86   Pulse 77   Temp 98.2 F (36.8 C)   Resp 18   Ht 5\' 2"  (1.575 m)   Wt 65.2 kg   SpO2 100%   BMI 26.29 kg/m  Constitutional: No distress . Vital signs reviewed. HEENT: EOMI, oral membranes moist Neck: supple Cardiovascular: RRR without murmur. No JVD    Respiratory: CTA Bilaterally without wheezes or rales. Normal effort    GI: BS +, non-tender, non-distended  Musc: No edema or tenderness in extremities. Neurological: Alert HOH Slow processing Motor:  RUE/RLE: Grossly 4+/5, unchanged LUE/LLE: Grossly 3+/5 with apraxia Skin: 3cm skin tear left wrist. Minimal drainage  Psychiatric: Normal mood.  Normal behavior.  Assessment/Plan: 1. 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  Physiatrist is providing close team supervision and 24 hour management of active medical problems listed below.  Physiatrist and rehab team continue to assess barriers to discharge/monitor patient progress toward functional and medical goals  Care Tool:  Bathing    Body parts bathed by patient: Left arm, Chest, Front perineal area, Buttocks, Right upper leg, Left upper leg, Face, Abdomen, Right lower leg, Left lower  leg, Right arm   Body parts bathed by helper: Right arm, Right lower leg, Left lower leg     Bathing assist Assist Level: Supervision/Verbal cueing     Upper Body Dressing/Undressing Upper body dressing   What is the patient wearing?: Bra, Pull over shirt    Upper body assist Assist Level: Supervision/Verbal cueing    Lower Body Dressing/Undressing Lower body dressing      What is the patient wearing?: Pants, Underwear/pull up     Lower body assist Assist for lower body dressing: Minimal Assistance - Patient > 75%     Toileting Toileting    Toileting assist Assist for toileting: Supervision/Verbal cueing     Transfers Chair/bed transfer  Transfers assist     Chair/bed transfer assist level: Contact Guard/Touching assist     Locomotion Ambulation   Ambulation assist      Assist level: Contact Guard/Touching assist Assistive device: No Device Max distance: 200 ft   Walk 10 feet activity   Assist     Assist level: Contact Guard/Touching assist Assistive device: No Device   Walk 50 feet activity   Assist    Assist level: Contact Guard/Touching assist Assistive device: No Device    Walk 150 feet activity   Assist Walk 150 feet activity did not occur: Safety/medical concerns  Assist level: Contact Guard/Touching assist Assistive device: No Device    Walk 10 feet on uneven surface  activity   Assist     Assist level: Minimal Assistance - Patient > 75% Assistive device: Other (comment)(no device)   Wheelchair     Assist  Will patient use wheelchair at discharge?: (TBD)             Wheelchair 50 feet with 2 turns activity    Assist            Wheelchair 150 feet activity     Assist            Medical Problem List and Plan: 1.Functional deficits, left hemiplegia, 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 TEE revealed severe MR but no LA or LAA thrombus. Loop recorder placed to  monitor for rhythmic/cardiac source of stroke.  Continue CIR therapies, SLP, OT, PT, team conference today 2. Antithrombotics: -DVT/anticoagulation:Pharmaceutical:Lovenox -antiplatelet therapy: DAPT                         -ASA 81 mg daily and plavix daily for 3 weeks then plavix alone 3. Pain Management:Tylenol prn 4. Mood:LCSW to follow for evaluation and support. -antipsychotic agents: NA 5. Neuropsych: This patientis?fully capable of making decisions on herown behalf. 6. Skin/Wound Care:Routine pressure relief measures.  -local care to left wrist.  7. Fluids/Electrolytes/Nutrition:Monitor I/O. 8. SPQ:ZRAQTMA BP bid--continue amlodipine and lisinopril  Controlled on 8/4  Monitor with increased mobility 9. GERD: Continue PPI. 10. Neuropathy: Managed with use of gabapentin and baclofen at nights.  Stable on 8/2 11. Hypokalemia:   Potassium 3.8 on 8/1  Supplemented x2 days. 12.  Hypoalbuminemia  Supplement initiated on 8/1 13.  Acute blood loss anemia  Hemoglobin 11.9 on 8/1   Continue to monitor   LOS: 4 days [place] 11/04/2018, 10:34 AM

## 2018-11-04 NOTE — Progress Notes (Signed)
Speech Language Pathology Daily Session Note  Patient Details  Name: Morgan Boyer MRN: [id number] Date of Birth: [birth year]  Today's Date: 11/04/2018 SLP Individual Time: 1030-1110 SLP Individual Time Calculation (min): 40 min  Short Term Goals: Week 1: SLP Short Term Goal 1 (Week 1): Patient will demonstrate funtional problem solving for mildly complex tasks with min verbal cues SLP Short Term Goal 2 (Week 1): Patient will demonstrate recent (10 minute or greater) recall of novel infomration with Min A verbal c ues with use of compenstaory strategies. SLP Short Term Goal 3 (Week 1): Patient will complete medication and money management tasks with supervision.  Skilled Therapeutic Interventions: Skilled treatment session focused on cognitive goals. SLP facilitated session by providing supervision level verbal cues for organization and problem solving during a complex money management task of organizing and balancing a checkbook. Patient also recalled events from previous therapy sessions with Mod I. Patient left upright in wheelchair with alarm on and all needs within reach. Continue with current plan of care.      Pain No/Denies Pain   Therapy/Group: Individual Therapy  [place] V[place]annon 11/04/2018, 11:25 AM

## 2018-11-05 ENCOUNTER — Inpatient Hospital Stay (HOSPITAL_COMMUNITY): Payer: PPO | Admitting: Physical Therapy

## 2018-11-05 ENCOUNTER — Inpatient Hospital Stay (HOSPITAL_COMMUNITY): Payer: PPO

## 2018-11-05 ENCOUNTER — Inpatient Hospital Stay (HOSPITAL_COMMUNITY): Payer: PPO | Admitting: Speech Pathology

## 2018-11-05 LAB — CBC
HCT: 34.1 % — ABNORMAL LOW (ref 36.0–46.0)
Hemoglobin: 11.3 g/dL — ABNORMAL LOW (ref 12.0–15.0)
MCH: 28.3 pg (ref 26.0–34.0)
MCHC: 33.1 g/dL (ref 30.0–36.0)
MCV: 85.5 fL (ref 80.0–100.0)
Platelets: 234 10*3/uL (ref 150–400)
RBC: 3.99 MIL/uL (ref 3.87–5.11)
RDW: 13.1 % (ref 11.5–15.5)
WBC: 5.9 10*3/uL (ref 4.0–10.5)
nRBC: 0 % (ref 0.0–0.2)

## 2018-11-05 LAB — BASIC METABOLIC PANEL
Anion gap: 8 (ref 5–15)
BUN: 21 mg/dL (ref 8–23)
CO2: 27 mmol/L (ref 22–32)
Calcium: 8.9 mg/dL (ref 8.9–10.3)
Chloride: 106 mmol/L (ref 98–111)
Creatinine, Ser: 0.75 mg/dL (ref 0.44–1.00)
GFR calc Af Amer: >60 mL/min (ref >60)
GFR calc non Af Amer: >60 mL/min (ref >60)
Glucose, Bld: 98 mg/dL (ref 70–99)
Potassium: 3.9 mmol/L (ref 3.5–5.1)
Sodium: 141 mmol/L (ref 135–145)

## 2018-11-05 MED ORDER — POLYVINYL ALCOHOL 1.4 % OP SOLN
1.0000 [drp] | OPHTHALMIC | Status: DC | PRN
  Filled 2018-11-05: qty 15

## 2018-11-05 MED ORDER — NAPHAZOLINE-GLYCERIN 0.012-0.2 % OP SOLN
1.0000 [drp] | Freq: Four times a day (QID) | OPHTHALMIC | Status: DC | PRN
  Administered 2018-11-05 (×2): 1 [drp] via OPHTHALMIC
  Filled 2018-11-05: qty 15

## 2018-11-05 MED ORDER — HYPROMELLOSE (GONIOSCOPIC) 2.5 % OP SOLN
1.0000 [drp] | Freq: Three times a day (TID) | OPHTHALMIC | Status: DC
  Administered 2018-11-05: 1 [drp] via OPHTHALMIC
  Filled 2018-11-05: qty 15

## 2018-11-05 NOTE — Progress Notes (Addendum)
Physical Therapy Session Note  Patient Details  Name: Morgan Boyer MRN: [id number] Date of Birth: January 29, 1932  Today's Date: 11/05/2018 PT Individual Time: 9629-5284 and 1121-1200 PT Individual Time Calculation (min): 75 min and 39 min  Short Term Goals: Week 1:  PT Short Term Goal 1 (Week 1): Pt will perform bed mobility with supervision PT Short Term Goal 2 (Week 1): Pt will perform bed<>chair transfers with CGA PT Short Term Goal 3 (Week 1): Pt will ambulate at least 175ft using LRAD with CGA PT Short Term Goal 4 (Week 1): Pt will ascend/descended 4 steps using L handrail and CGA  Skilled Therapeutic Interventions/Progress Updates:  Treatment 1: Pt received in recliner & agreeable to tx. Pt requests voltaren gel for BUE soreness & RN made aware. Pt ambulates around room to collect clothing and in/out of bathroom with CGA without AD with pt demonstrating decreased balance when negotiating uneven bathroom threshold. Pt also requires cuing to not grab to furniture for support. Pt dons clothing sitting/standing EOB with distant supervision & extra time, donning bra, shirt, pants, socks & shoes. Pt with continent void in toilet & performed hand hygiene and grooming (brush hair & teeth) standing at sink with close supervision. In hallway by gym pt engages in side stepping to L<>R 30 ft x 2 with min assist and cuing for technique and retrograde gait 30 ft x 4 with min assist without UE support with posterior lean and pt with improving ability to correct, but decreased weight shifting especially to L with therapist providing slight manual facilitation to correct. Pt performed standing 3" step taps with focus on weight shifting L<>R and coordination BLE and mod/max assist for balance. Pt ambulates back to room with CGA & difficulty walking & talking simultaneously. At end of session pt left in recliner with chair alarm donned & all needs at hand.   Educated pt on recommendation of f/u therapy once pt  discharges from CIR.   Treatment 2: Pt received in recliner with RN present applying muscle rub to BUE. Pt states she feels as though she has something in her eye following RN putting drops in and reluctantly willing to participate, also provided pt with wet cloth to wipe eyes. Pt willing to go to gym via w/c dependent assist and completes stand pivot recliner>w/c and w/c<>nu-step with close supervision. Pt utilizes nu-step on level 3 x 10 minutes with all four extremities with focus on maintaining grasp with LUE for strengthening & NMR, with focus on coordination of reicprocal movements and global strengthening.  Discussed d/c with pt voicing that she doesn't want to burden family to care for her but pt declines concerns regarding maneuvering around house. Pt ambulates back to room with CGA and no AD. Pt completes 10x sit<>stand without UE support with supervision for BLE strengthening. At end of session pt left in recliner with chair alarm donned & all needs in reach.    Therapy Documentation Precautions:  Precautions Precautions: Fall Restrictions Weight Bearing Restrictions: No Other Position/Activity Restrictions: skin tear left hand, loop recorder surgical site chest   Therapy/Group: Individual Therapy  Waunita Schooner 11/05/2018, 12:10 PM

## 2018-11-05 NOTE — Progress Notes (Signed)
Occupational Therapy Session Note  Patient Details  Name: Morgan Boyer MRN: [id number] Date of Birth: 1931/10/05  Today's Date: 11/05/2018 OT Individual Time: 1300-1411 OT Individual Time Calculation (min): 71 min    Short Term Goals: Week 1:  OT Short Term Goal 1 (Week 1): patient will complete UB bathing and dressing with CS/set up, LB bathing and dressing with min A OT Short Term Goal 2 (Week 1): patient will perform functional transfers with CGA/CS OT Short Term Goal 3 (Week 1): patient will increase use of left UE to perform bathing/grooming using L UE as functional assist  Skilled Therapeutic Interventions/Progress Updates:    1;1. Pt received in w/c agreeable to bathing and dressing with no pain reported. Pt completes ambulation to shower chair in shower with CGA and no AD to undress with CGA for standing balance. Pt bathes with CGA for washing buttocks and VC for using LUE to wash majority of body for NMR. Pt dresses from sit to stand on toilet with CGA for donning LB clothing and MIN A for UB clothing for HOH A to fasten bra. Pt completes manipulation of scrabble pieces for NMR and FMC of LUE: turning over, finger>palm, and reaching to place in scrabble board while seated with VC for LUE use. Pt ambulates back to room with CGA and no AD. Exited session with pt seated in w/c, call light in reach and all needs met.  Therapy Documentation Precautions:  Precautions Precautions: Fall Restrictions Weight Bearing Restrictions: No Other Position/Activity Restrictions: skin tear left hand, loop recorder surgical site chest General:   Vital Signs:   Pain:   ADL: ADL Eating: Set up Where Assessed-Eating: Wheelchair Grooming: Minimal assistance Where Assessed-Grooming: Sitting at sink Upper Body Bathing: Minimal assistance Where Assessed-Upper Body Bathing: Sitting at sink Lower Body Bathing: Moderate assistance Where Assessed-Lower Body Bathing: Sitting at sink, Standing at  sink Upper Body Dressing: Moderate assistance Where Assessed-Upper Body Dressing: Wheelchair Lower Body Dressing: Maximal assistance Where Assessed-Lower Body Dressing: Wheelchair ADL Comments: patient declined shower due to loop recorder placement yesterday and concern for it getting wet Vision   Perception    Praxis   Exercises:   Other Treatments:     Therapy/Group: Individual Therapy  Tonny Branch 11/05/2018, 1:53 PM

## 2018-11-05 NOTE — Progress Notes (Signed)
Speech Language Pathology Daily Session Note  Patient Details  Name: Morgan Boyer MRN: [id number] Date of Birth: 1931-04-04  Today's Date: 11/05/2018 SLP Individual Time: 0730-0815 SLP Individual Time Calculation (min): 45 min  Short Term Goals: Week 1: SLP Short Term Goal 1 (Week 1): Patient will demonstrate funtional problem solving for mildly complex tasks with min verbal cues SLP Short Term Goal 2 (Week 1): Patient will demonstrate recent (10 minute or greater) recall of novel infomration with Min A verbal c ues with use of compenstaory strategies. SLP Short Term Goal 3 (Week 1): Patient will complete medication and money management tasks with supervision.  Skilled Therapeutic Interventions: Skilled treatment session focused on cognitive goals. SLP facilitated session by providing extra time and Min A verbal cues for problem solving and organization during a complex scheduling task. Task was unable to be completed and patient reported difficulty due to session being "too early" and "unable to think." Patient left upright in recliner with alarm on and all needs within reach. Continue with current plan of care.      Pain Pain Assessment Pain Scale: 0-10 Pain Score: 0-No pain  Therapy/Group: Individual Therapy  Morgan Boyer 11/05/2018, 10:06 AM

## 2018-11-05 NOTE — Progress Notes (Signed)
When patient was given the first dose of Isopto Tears she immediately began saying "it feels like there is something in my eye" and she requested that it be washed out. Patient was given a cool compress to hold against her eyes which she reported helped. She does not want to use these eyedrops again. Writer will notify P. Love, PA.

## 2018-11-05 NOTE — Progress Notes (Signed)
[facility] PHYSICAL MEDICINE & REHABILITATION PROGRESS NOTE  Subjective/Complaints: Up in chair. Happy with progress. Denies pain. Slept well. Has had dry eyes  ROS: Patient denies fever, rash, sore throat, blurred vision, nausea, vomiting, diarrhea, cough, shortness of breath or chest pain, joint or back pain, headache, or mood change.    Objective: Vital Signs: Blood pressure 118/66, pulse 64, temperature 98 F (36.7 C), temperature source Oral, resp. rate 16, height 5\' 2"  (1.575 m), weight 65.3 kg, SpO2 100 %. No results found. Recent Labs    11/05/18 0632  WBC 5.9  HGB 11.3*  HCT 34.1*  PLT 234   Recent Labs    11/05/18 0632  NA 141  K 3.9  CL 106  CO2 27  GLUCOSE 98  BUN 21  CREATININE 0.75  CALCIUM 8.9    Physical Exam: BP 118/66   Pulse 64   Temp 98 F (36.7 C) (Oral)   Resp 16   Ht 5\' 2"  (1.575 m)   Wt 65.3 kg   SpO2 100%   BMI 26.33 kg/m  Constitutional: No distress . Vital signs reviewed. HEENT: EOMI, oral membranes moist Neck: supple Cardiovascular: RRR without murmur. No JVD    Respiratory: CTA Bilaterally without wheezes or rales. Normal effort    GI: BS +, non-tender, non-distended  Musc: No edema or tenderness in extremities. Neurological: Alert HOH, mild STM deficits. Improved processing speed Motor:  RUE/RLE: Grossly 4+/5, unchanged LUE: 3+ to 4-/5. LLE 4- to 4/5.  Skin: 3cm skin tear left wrist dressed.   Psychiatric: Normal mood.  Normal behavior.  Assessment/Plan: 1. 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  Physiatrist is providing close team supervision and 24 hour management of active medical problems listed below.  Physiatrist and rehab team continue to assess barriers to discharge/monitor patient progress toward functional and medical goals  Care Tool:  Bathing    Body parts bathed by patient: Left arm,  Chest, Front perineal area, Buttocks, Right upper leg, Left upper leg, Face, Abdomen, Right lower leg, Left lower leg, Right arm   Body parts bathed by helper: Right arm, Right lower leg, Left lower leg     Bathing assist Assist Level: Supervision/Verbal cueing     Upper Body Dressing/Undressing Upper body dressing   What is the patient wearing?: Bra, Pull over shirt    Upper body assist Assist Level: Supervision/Verbal cueing    Lower Body Dressing/Undressing Lower body dressing      What is the patient wearing?: Pants, Underwear/pull up     Lower body assist Assist for lower body dressing: Minimal Assistance - Patient > 75%     Toileting Toileting    Toileting assist Assist for toileting: Supervision/Verbal cueing     Transfers Chair/bed transfer  Transfers assist     Chair/bed transfer assist level: Contact Guard/Touching assist     Locomotion Ambulation   Ambulation assist      Assist level: Supervision/Verbal cueing Assistive device: No Device Max distance: 150 ft   Walk 10 feet activity   Assist     Assist level: Supervision/Verbal cueing Assistive device: No Device   Walk 50 feet activity   Assist    Assist level: Supervision/Verbal cueing Assistive device: No Device    Walk 150 feet activity   Assist Walk 150 feet activity did not occur: Safety/medical concerns  Assist level: Supervision/Verbal cueing Assistive device: No Device  Walk 10 feet on uneven surface  activity   Assist     Assist level: Minimal Assistance - Patient > 75% Assistive device: Other (comment)(no device)   Wheelchair     Assist Will patient use wheelchair at discharge?: (TBD)             Wheelchair 50 feet with 2 turns activity    Assist            Wheelchair 150 feet activity     Assist            Medical Problem List and Plan: 1.Functional deficits, left hemiplegia, 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 TEE revealed severe MR but no LA or LAA thrombus. Loop recorder placed to monitor for rhythmic/cardiac source of stroke.  Continue CIR therapies, SLP, OT, PT   -ELOS 8/11 2. Antithrombotics: -DVT/anticoagulation:Pharmaceutical:Lovenox -antiplatelet therapy: DAPT                         -ASA 81 mg daily and plavix daily for 3 weeks then plavix alone 3. Pain Management:Tylenol prn 4. Mood:LCSW to follow for evaluation and support. -antipsychotic agents: NA 5. Neuropsych: This patientis?fully capable of making decisions on herown behalf. 6. Skin/Wound Care:Routine pressure relief measures.  -local care to left wrist.  7. Fluids/Electrolytes/Nutrition:Monitor I/O.  -I personally reviewed the patient's labs today.   8. GEZ:MOQHUTM BP bid--continue amlodipine and lisinopril  Controlled on 8/5  Monitor with increased mobility 9. GERD: Continue PPI. 10. Neuropathy: Managed with use of gabapentin and baclofen at nights.  Stable on 8/2 11. Hypokalemia:   Potassium  3.9 8/5  Supplemented x2 days. 12.  Hypoalbuminemia  Supplement initiated on 8/1 13.  Acute blood loss anemia  Hemoglobin holding at 11.3 on 8/5  Continue to monitor   LOS: 5 days A FACE TO [place] 11/05/2018, 9:33 AM

## 2018-11-06 ENCOUNTER — Inpatient Hospital Stay (HOSPITAL_COMMUNITY): Payer: PPO

## 2018-11-06 ENCOUNTER — Inpatient Hospital Stay (HOSPITAL_COMMUNITY): Payer: PPO | Admitting: Speech Pathology

## 2018-11-06 NOTE — Progress Notes (Signed)
Social Work Discharge Note   The overall goal for the admission was met for:   Discharge location: Yes - home  Length of Stay: Yes - 11 days  Discharge activity level: Yes - supervision/mod I  Home/community participation: Yes  Services provided included: MD, RD, PT, OT, SLP, RN, Pharmacy and SW  Financial Services: Private Insurance: [employer]  Follow-up services arranged: Outpatient: PT/OT from [facility] and Patient/Family has no preference for HH/DME agencies Pt has all needed DME at home already.  Comments (or additional information): Pt's son came for family education and feels that family will be able to meet pt's needs at d/c at her home.  They will get her to outpt Rehab.  Patient/Family verbalized understanding of follow-up arrangements: Yes  Individual responsible for coordination of the follow-up plan:  Pt and her son, Averil Digman - [phone]  Confirmed correct DME delivered: Trey Sailors 11/06/2018    Sukhman Martine, Silvestre Mesi

## 2018-11-06 NOTE — Progress Notes (Signed)
Speech Language Pathology Weekly Progress and Session Note  Patient Details  Name: Morgan Boyer MRN: [id number] Date of Birth: December 18, 1931  Beginning of progress report period: October 31, 2018 End of progress report period: November 06, 2018  Today's Date: 11/06/2018 SLP Individual Time: 6294-7654 SLP Individual Time Calculation (min): 55 min  Short Term Goals: Week 1: SLP Short Term Goal 1 (Week 1): Patient will demonstrate funtional problem solving for mildly complex tasks with min verbal cues SLP Short Term Goal 1 - Progress (Week 1): Met SLP Short Term Goal 2 (Week 1): Patient will demonstrate recent (10 minute or greater) recall of novel infomration with Min A verbal c ues with use of compenstaory strategies. SLP Short Term Goal 2 - Progress (Week 1): Met SLP Short Term Goal 3 (Week 1): Patient will complete medication and money management tasks with supervision. SLP Short Term Goal 3 - Progress (Week 1): Met    New Short Term Goals: Week 2: SLP Short Term Goal 1 (Week 2): STGs=LTGs due to ELOS  Weekly Progress Updates: Patient continues to make excellent gains and has met 3 of 3 STGs this admission. Currently, patient requires overall supervision level verbal cues for complex problem solving, recall and awareness with functional and mildly complex tasks. Patient education ongoing. Patient would benefit from continued skilled SLP intervention to maximize her cognitive functioning prior to discharge.     Intensity: Minumum of 1-2 x/day, 30 to 90 minutes Frequency: 3 to 5 out of 7 days Duration/Length of Stay: 11/12/18 Treatment/Interventions: Cognitive remediation/compensation;Therapeutic Activities;Internal/external aids;Cueing hierarchy;Environmental controls;Patient/family education;Functional tasks   Daily Session  Skilled Therapeutic Interventions:  Skilled treatment session focused on cognitive goals. SLP facilitated session by providing extra time and supervision level verbal  cues for problem solving and organization during a complex scheduling task. Patient also participated in a functional conversation about d/c planning and demonstrated appropriate anticipatory awareness with supervision level verbal cues. Patient left upright in wheelchair with alarm on and all needs within reach. Continue with current plan of care.      Pain No/Denies Pain   Therapy/Group: Individual Therapy  Leiland Mihelich 11/06/2018, 3:20 PM

## 2018-11-06 NOTE — Progress Notes (Signed)
Morgan Boyer PHYSICAL MEDICINE & REHABILITATION PROGRESS NOTE  Subjective/Complaints: No new complaints. Happy with progress. Denies pain  ROS: Patient denies fever, rash, sore throat, blurred vision, nausea, vomiting, diarrhea, cough, shortness of breath or chest pain, joint or back pain, headache, or mood change.     Objective: Vital Signs: Blood pressure 126/68, pulse 70, temperature 98 F (36.7 C), resp. rate 19, height 5\' 2"  (1.575 m), weight 65.3 kg, SpO2 100 %. No results found. Recent Labs    11/05/18 0632  WBC 5.9  HGB 11.3*  HCT 34.1*  PLT 234   Recent Labs    11/05/18 0632  NA 141  K 3.9  CL 106  CO2 27  GLUCOSE 98  BUN 21  CREATININE 0.75  CALCIUM 8.9    Physical Exam: BP 126/68   Pulse 70   Temp 98 F (36.7 C)   Resp 19   Ht 5\' 2"  (1.575 m)   Wt 65.3 kg   SpO2 100%   BMI 26.33 kg/m  Constitutional: No distress . Vital signs reviewed. HEENT: EOMI, oral membranes moist Neck: supple Cardiovascular: RRR without murmur. No JVD    Respiratory: CTA Bilaterally without wheezes or rales. Normal effort    GI: BS +, non-tender, non-distended  Musc: No edema or tenderness in extremities. Neurological: Alert HOH, mild STM deficits. Improved processing speed Motor:  RUE/RLE: Grossly 4+/5, unchanged LUE: 3+ to 4-/5. LLE 4- to 4/5.  Skin: 3cm skin tear left wrist dressed.   Psychiatric: Normal mood.  Normal behavior.  Assessment/Plan: 1. 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  Physiatrist is providing close team supervision and 24 hour management of active medical problems listed below.  Physiatrist and rehab team continue to assess barriers to discharge/monitor patient progress toward functional and medical goals  Care Tool:  Bathing    Body parts bathed by patient: Left arm, Chest, Front perineal area, Buttocks, Right upper leg,  Left upper leg, Face, Abdomen, Right lower leg, Left lower leg, Right arm   Body parts bathed by helper: Right arm, Right lower leg, Left lower leg     Bathing assist Assist Level: Contact Guard/Touching assist     Upper Body Dressing/Undressing Upper body dressing   What is the patient wearing?: Bra, Pull over shirt    Upper body assist Assist Level: Minimal Assistance - Patient > 75%    Lower Body Dressing/Undressing Lower body dressing      What is the patient wearing?: Underwear/pull up, Pants     Lower body assist Assist for lower body dressing: Contact Guard/Touching assist     Toileting Toileting    Toileting assist Assist for toileting: Supervision/Verbal cueing     Transfers Chair/bed transfer  Transfers assist     Chair/bed transfer assist level: Contact Guard/Touching assist     Locomotion Ambulation   Ambulation assist      Assist level: Contact Guard/Touching assist Assistive device: No Device Max distance: 100 FT   Walk 10 feet activity   Assist     Assist level: Contact Guard/Touching assist Assistive device: No Device   Walk 50 feet activity   Assist    Assist level: Contact Guard/Touching assist Assistive device: No Device    Walk 150 feet activity   Assist Walk 150 feet activity did not occur: Safety/medical concerns  Assist level: Contact Guard/Touching assist Assistive device: No Device    Walk 10  feet on uneven surface  activity   Assist     Assist level: Minimal Assistance - Patient > 75% Assistive device: Other (comment)(no device)   Wheelchair     Assist Will patient use wheelchair at discharge?: (TBD)             Wheelchair 50 feet with 2 turns activity    Assist            Wheelchair 150 feet activity     Assist            Medical Problem List and Plan: 1.Functional deficits, left hemiplegia, 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 TEE  revealed severe MR but no LA or LAA thrombus. Loop recorder placed to monitor for rhythmic/cardiac source of stroke.  Continue CIR therapies, SLP, OT, PT   -on track for ELOS 8/11 2. Antithrombotics: -DVT/anticoagulation:Pharmaceutical:Lovenox -antiplatelet therapy: DAPT                         -ASA 81 mg daily and plavix daily for 3 weeks then plavix alone 3. Pain Management:Tylenol prn 4. Mood:LCSW to follow for evaluation and support. -antipsychotic agents: NA 5. Neuropsych: This patientis?fully capable of making decisions on herown behalf. 6. Skin/Wound Care:Routine pressure relief measures.  -continue dressing to skin tear left wrist 7. Fluids/Electrolytes/Nutrition:Monitor I/O.  -I personally reviewed the patient's labs today.   8. UUV:[id number] BP bid--continue amlodipine and lisinopril  Controlled on 8/6  Monitor with increased mobility 9. GERD: Continue PPI. 10. Neuropathy: Managed with use of gabapentin and baclofen at nights.  Stable on 8/2 11. Hypokalemia:   Potassium  3.9 8/5  Supplemented x2 days. 12.  Hypoalbuminemia  Supplement initiated on 8/1 13.  Acute blood loss anemia  Hemoglobin holding at 11.3 on 8/5  Continue to monitor   LOS: 6 days A FACE TO [place] 11/06/2018, 12:01 PM

## 2018-11-06 NOTE — Progress Notes (Signed)
Occupational Therapy Session Note  Patient Details  Name: Morgan Boyer MRN: [id number] Date of Birth: 13-Dec-1931  Today's Date: 11/06/2018 OT Individual Time: 1104-1200 OT Individual Time Calculation (min): 56 min    Short Term Goals: Week 1:  OT Short Term Goal 1 (Week 1): patient will complete UB bathing and dressing with CS/set up, LB bathing and dressing with min A OT Short Term Goal 2 (Week 1): patient will perform functional transfers with CGA/CS OT Short Term Goal 3 (Week 1): patient will increase use of left UE to perform bathing/grooming using L UE as functional assist  Skilled Therapeutic Interventions/Progress Updates:    1:1. Pt received in d/t reporting gatigue, but no pain. Pt declines bathing and dressing this session d/t fatigue. Pt agreeable to working on LUE NMR/FMC. Pt completes table top tasks: graded PVC pipe tree, buttons, zippers, tying shoe laces, and card matching game (incorporating memory) with min VC for forced LUE use d/t mild L inattention and VC for decreasing compensatory movements. Exited session with pt seated in w/c, call light in reach and all needs met  Therapy Documentation Precautions:  Precautions Precautions: Fall Restrictions Weight Bearing Restrictions: No Other Position/Activity Restrictions: skin tear left hand, loop recorder surgical site chest General:   Vital Signs: Therapy Vitals BP: 126/68 Pain:   ADL: ADL Eating: Set up Where Assessed-Eating: Wheelchair Grooming: Minimal assistance Where Assessed-Grooming: Sitting at sink Upper Body Bathing: Minimal assistance Where Assessed-Upper Body Bathing: Sitting at sink Lower Body Bathing: Moderate assistance Where Assessed-Lower Body Bathing: Sitting at sink, Standing at sink Upper Body Dressing: Moderate assistance Where Assessed-Upper Body Dressing: Wheelchair Lower Body Dressing: Maximal assistance Where Assessed-Lower Body Dressing: Wheelchair ADL Comments: patient declined  shower due to loop recorder placement yesterday and concern for it getting wet Vision   Perception    Praxis   Exercises:   Other Treatments:     Therapy/Group: Individual Therapy  Tonny Branch 11/06/2018, 12:04 PM

## 2018-11-06 NOTE — Progress Notes (Signed)
Physical Therapy Session Note  Patient Details  Name: Morgan Boyer MRN: [id number] Date of Birth: [birth year]  Today's Date: 11/06/2018 PT Individual Time: 0915-1000 PT Individual Time Calculation (min): 45 min   Short Term Goals: Week 1:  PT Short Term Goal 1 (Week 1): Pt will perform bed mobility with supervision PT Short Term Goal 2 (Week 1): Pt will perform bed<>chair transfers with CGA PT Short Term Goal 3 (Week 1): Pt will ambulate at least 159ft using LRAD with CGA PT Short Term Goal 4 (Week 1): Pt will ascend/descended 4 steps using L handrail and CGA  Skilled Therapeutic Interventions/Progress Updates:     Patient in w/c with NT in the room assisting with donning shoes upon PT arrival. Patient alert and agreeable to PT session.  Therapeutic Activity: Transfers: Patient performed sit to/from stand x8 with CGA-supervision for safety/balance. Provided verbal cues for leaning forward to stand and attempting to stand without use of UEs for increased balance/strength, but reaching back to sit for safety when not with therapy. Five times Sit to Stand Test (FTSS) Method: Use a straight back chair with a solid seat that is 16-18" high. Ask participant to sit on the chair with arms folded across their chest.   Instructions: "Stand up and sit down as quickly as possible 5 times, keeping your arms folded across your chest."   Measurement: Stop timing when the participant stands the 5th time.  TIME: __12.95 (1st trial) 8.51 (2nd trial)____ (in seconds)  Times > 13.6 seconds is associated with increased disability and morbidity (Guralnik, 2000) Times > 15 seconds is predictive of recurrent falls in healthy individuals aged 18 and older (Buatois, et al., 2008) Normal performance values in community dwelling individuals aged 67 and older (Bohannon, 2006): o 60-69 years: 11.4 seconds o 70-79 years: 12.6 seconds o 80-89 years: 14.8 seconds  MCID: ? 2.3 seconds for Vestibular Disorders  (Meretta, 2006)  Gait Training:  Patient ambulated 90 feet and 60 feet using no AD with CGA-close supervision for safety/balance. Ambulated with decreased gait speed, decreased B step length, decreased step height, mild forward trunk lean, narrow BOS, and downward head gaze. Provided verbal cues for looking ahead, erect posture and increased gait speed for increased balance with gait.  Wheelchair Mobility:  Patient was transported with total A in the w/c for energy conservation and time management during session.   Neuromuscular Re-ed: Patient performed the following dynamic gait and duel task gait tasks with CGA-close supervision for safety/balance over 20-30 feet: -Walk with change in gait speed with, minimal change in fast speed, notable change in slow speed -Walk with eyes closed with, significant decreased is step length and gait speed despite cues for ambulating at normal gait speed -Walk with horizontal and vertical head turns, patient denied dizziness, but reported feeling "disoriented" after, noted decreased BOS and reduced head turn to the L, provided cues for looking at a target on L with improvement  -Walk backwards, significantly decreased gait speed and step length, provided cues for increased step length for improved balance -Tandem walking with min A for balance on line of tape 10 steps x2  -Stepping over 1 glove box x4  -Walking and providing steps for making her favorite sandwich, noted decreased gait speed and intermittent stopping during gait while recalling instructions  Educated on purpose of exercises to reduce fall risk with daily activities. Patient very receptive to [employer]  Patient in w/c at end of session with breaks locked, seat belt alarm set, and  all needs within reach.    Therapy Documentation Precautions:  Precautions Precautions: Fall Restrictions Weight Bearing Restrictions: No Other Position/Activity Restrictions: skin tear left hand, loop recorder  surgical site chest Pain: Patient denied pain throughout session.   Therapy/Group: Individual Therapy  David Towson L Leshon Armistead PT, DPT  11/06/2018, 12:59 PM

## 2018-11-06 NOTE — Progress Notes (Signed)
Social Work Patient ID: Morgan Boyer, female   DOB: 27-Jan-1932, 85 y.o.   MRN: [id number]   CSW met with pt and spoke with her son via telephone to update them on team conference discussion and targeted d/c date of 11-11-18.  Pt understands why she needs a few more days and son is supportive of this.  He will come in for family education on 11-10-18 and another caregiver may come, as well.  Pt is making good progress.  CSW to arrange outpt rehab.  Pt has not DME needs, as she has necessary/recommended DME at home already.  CSW will continue to follow and assist as needed.

## 2018-11-06 NOTE — Progress Notes (Signed)
Occupational Therapy Session Note  Patient Details  Name: ALILA SOTERO MRN: [id number] Date of Birth: 21-Jan-1932  Today's Date: 11/06/2018 OT Individual Time: 1345-1430 OT Individual Time Calculation (min): 45 min    Short Term Goals: Week 1:  OT Short Term Goal 1 (Week 1): patient will complete UB bathing and dressing with CS/set up, LB bathing and dressing with min A OT Short Term Goal 2 (Week 1): patient will perform functional transfers with CGA/CS OT Short Term Goal 3 (Week 1): patient will increase use of left UE to perform bathing/grooming using L UE as functional assist Week 2:     Skilled Therapeutic Interventions/Progress Updates:    Pt resting in w/c upon arrival.  Pt stated that she had been "dozing off" and was "worn out."  Pt agreeable to therapy and requested to return to bed at end of session.  Initial focus on tub transfers-seat vs tub bench.  Pt currently has seat.  Pt states that her bathroom layout will not accommodate a bench. Pt states she has grab bars on three wall in her tub.  Pt practiced stepping over into tub using grab bars X 2 with supervision.  Pt states her sister will be staying with her for the first few weeks and she will be with her when she showers. Pt transitioned to gym and folded towels while standing at table.  Pt amb without AD and carrying towels to dirty linen bag.  Pt returned to room and transferred to bed to nap.  Bed alarm activated and all needs within reach.   Therapy Documentation Precautions:  Precautions Precautions: Fall Restrictions Weight Bearing Restrictions: No Other Position/Activity Restrictions: skin tear left hand, loop recorder surgical site chest   Pain:  Pt denies pain this afternoon   Therapy/Group: Individual Therapy  Leroy Libman 11/06/2018, 2:48 PM

## 2018-11-07 ENCOUNTER — Inpatient Hospital Stay (HOSPITAL_COMMUNITY): Payer: PPO

## 2018-11-07 ENCOUNTER — Inpatient Hospital Stay (HOSPITAL_COMMUNITY): Payer: PPO | Admitting: Physical Therapy

## 2018-11-07 ENCOUNTER — Inpatient Hospital Stay (HOSPITAL_COMMUNITY): Payer: PPO | Admitting: Speech Pathology

## 2018-11-07 NOTE — Progress Notes (Signed)
Occupational Therapy Session Note  Patient Details  Name: Morgan Boyer MRN: [id number] Date of Birth: May 17, 1931  Today's Date: 11/07/2018 OT Individual Time: 7579-7282 OT Individual Time Calculation (min): 72 min    Short Term Goals: Week 1:  OT Short Term Goal 1 (Week 1): patient will complete UB bathing and dressing with CS/set up, LB bathing and dressing with min A OT Short Term Goal 2 (Week 1): patient will perform functional transfers with CGA/CS OT Short Term Goal 3 (Week 1): patient will increase use of left UE to perform bathing/grooming using L UE as functional assist  Skilled Therapeutic Interventions/Progress Updates:    1:1. Pt received in w/c at sink finishing oral care with NT. No pain reported during session. Pt completes ambulation in to bathroom bathing with A only to control water hose from spraying out into rest of bathroom with HHA and VC for using LUE to wash hair. Pt completes LB dressing with set up and VC for UB dressing to fasten bra in front and spin around back. Pt completes ambulation in ADL apartment with S to transfer onto couch as this is preferred seat at home with S. Pt completes kitchen search activity with forced use of LUE to reach for items and VC for body positioning when opening refrigerator/freezer/pantry doors. Exited session with pt seated in w/c, call light in reach and all need smet  Therapy Documentation Precautions:  Precautions Precautions: Fall Restrictions Weight Bearing Restrictions: No Other Position/Activity Restrictions: skin tear left hand, loop recorder surgical site chest General:   Vital Signs:   Pain: Pain Assessment Pain Scale: 0-10 Pain Score: 0-No pain ADL: ADL Eating: Set up Where Assessed-Eating: Wheelchair Grooming: Minimal assistance Where Assessed-Grooming: Sitting at sink Upper Body Bathing: Minimal assistance Where Assessed-Upper Body Bathing: Sitting at sink Lower Body Bathing: Moderate assistance Where  Assessed-Lower Body Bathing: Sitting at sink, Standing at sink Upper Body Dressing: Moderate assistance Where Assessed-Upper Body Dressing: Wheelchair Lower Body Dressing: Maximal assistance Where Assessed-Lower Body Dressing: Wheelchair ADL Comments: patient declined shower due to loop recorder placement yesterday and concern for it getting wet Vision   Perception    Praxis   Exercises:   Other Treatments:     Therapy/Group: Individual Therapy  Tonny Branch 11/07/2018, 9:29 AM

## 2018-11-07 NOTE — Progress Notes (Signed)
[place] PHYSICAL MEDICINE & REHABILITATION PROGRESS NOTE  Subjective/Complaints: Up with OT. Getting dressed, putting on shoes.   ROS: Patient denies fever, rash, sore throat, blurred vision, nausea, vomiting, diarrhea, cough, shortness of breath or chest pain, joint or back pain, headache, or mood change.     Objective: Vital Signs: Blood pressure 117/66, pulse 65, temperature 98 F (36.7 C), resp. rate 18, height 5\' 2"  (1.575 m), weight 65.3 kg, SpO2 99 %. No results found. Recent Labs    11/05/18 0632  WBC 5.9  HGB 11.3*  HCT 34.1*  PLT 234   Recent Labs    11/05/18 0632  NA 141  K 3.9  CL 106  CO2 27  GLUCOSE 98  BUN 21  CREATININE 0.75  CALCIUM 8.9    Physical Exam: BP 117/66 (BP Location: Left Arm)   Pulse 65   Temp 98 F (36.7 C)   Resp 18   Ht 5\' 2"  (1.575 m)   Wt 65.3 kg   SpO2 99%   BMI 26.33 kg/m  Constitutional: No distress . Vital signs reviewed. HEENT: EOMI, oral membranes moist Neck: supple Cardiovascular: RRR with murmur. No JVD    Respiratory: CTA Bilaterally without wheezes or rales. Normal effort    GI: BS +, non-tender, non-distended  Musc: No edema or tenderness in extremities. Neurological: Alert HOH, mild STM deficits. Improved processing speed Motor:  RUE/RLE: Grossly 4+/5, unchanged LUE:  4- to4/5 with improved FMC. LLE 4- to 4/5.  Skin: 3cm skin tear clean left wrist.   Psychiatric: pleasant.  Assessment/Plan: 1. 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  Physiatrist is providing close team supervision and 24 hour management of active medical problems listed below.  Physiatrist and rehab team continue to assess barriers to discharge/monitor patient progress toward functional and medical goals  Care Tool:  Bathing    Body parts bathed by patient: Left arm, Chest, Front perineal area, Buttocks, Right  upper leg, Left upper leg, Face, Abdomen, Right lower leg, Left lower leg, Right arm   Body parts bathed by helper: Right arm, Right lower leg, Left lower leg     Bathing assist Assist Level: Contact Guard/Touching assist     Upper Body Dressing/Undressing Upper body dressing   What is the patient wearing?: Bra, Pull over shirt    Upper body assist Assist Level: Minimal Assistance - Patient > 75%    Lower Body Dressing/Undressing Lower body dressing      What is the patient wearing?: Underwear/pull up, Pants     Lower body assist Assist for lower body dressing: Contact Guard/Touching assist     Toileting Toileting    Toileting assist Assist for toileting: Supervision/Verbal cueing     Transfers Chair/bed transfer  Transfers assist     Chair/bed transfer assist level: Contact Guard/Touching assist     Locomotion Ambulation   Ambulation assist      Assist level: Contact Guard/Touching assist Assistive device: No Device Max distance: 100 FT   Walk 10 feet activity   Assist     Assist level: Contact Guard/Touching assist Assistive device: No Device   Walk 50 feet activity   Assist    Assist level: Contact Guard/Touching assist Assistive device: No Device    Walk 150 feet activity   Assist Walk 150 feet activity did not occur: Safety/medical concerns  Assist level: Contact Guard/Touching assist Assistive device: No Device  Walk 10 feet on uneven surface  activity   Assist     Assist level: Minimal Assistance - Patient > 75% Assistive device: Other (comment)(no device)   Wheelchair     Assist Will patient use wheelchair at discharge?: (TBD)             Wheelchair 50 feet with 2 turns activity    Assist            Wheelchair 150 feet activity     Assist            Medical Problem List and Plan: 1.Functional deficits, left hemiplegia, 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  TEE revealed severe MR but no LA or LAA thrombus. Loop recorder placed to monitor for rhythmic/cardiac source of stroke.  Continue CIR therapies, SLP, OT, PT   -making steady gains. on track for ELOS 8/11 2. Antithrombotics: -DVT/anticoagulation:Pharmaceutical:Lovenox -antiplatelet therapy: DAPT                         -ASA 81 mg daily and plavix daily for 3 weeks then plavix alone 3. Pain Management:Tylenol prn 4. Mood:LCSW to follow for evaluation and support. -antipsychotic agents: NA 5. Neuropsych: This patientis?fully capable of making decisions on herown behalf. 6. Skin/Wound Care:Routine pressure relief measures.  -continue dressing to skin tear left wrist 7. Fluids/Electrolytes/Nutrition:Monitor I/O.  -intake reasonable   8. KGS:UPJSRPR BP bid--continue amlodipine and lisinopril  Controlled on 8/6  Monitor with increased mobility 9. GERD: Continue PPI. 10. Neuropathy: Managed with use of gabapentin and baclofen at nights.  Stable on 8/2 11. Hypokalemia:   Potassium  3.9 8/5  Supplemented x2 days.  -recheck monday 12.  Hypoalbuminemia  Supplement initiated on 8/1 13.  Acute blood loss anemia  Hemoglobin holding at 11.3 on 8/5  Continue to monitor   LOS: 7 days A FACE TO [place] 11/07/2018, 11:42 AM

## 2018-11-07 NOTE — Plan of Care (Signed)
  Problem: RH SKIN INTEGRITY Goal: RH STG SKIN FREE OF INFECTION/BREAKDOWN Description: Healing of skin tear to left hand Outcome: Progressing Goal: RH STG ABLE TO PERFORM INCISION/WOUND CARE W/ASSISTANCE Description: STG Able To Perform Incision/Wound Care With  [employer]. Outcome: Progressing   Problem: RH SAFETY Goal: RH STG ADHERE TO SAFETY PRECAUTIONS W/ASSISTANCE/DEVICE Description: STG Adhere to Safety Precautions With  Min Assistance/Device bed alarm and chair alarm Outcome: Progressing   Problem: RH PAIN MANAGEMENT Goal: RH STG PAIN MANAGED AT OR BELOW PT'S PAIN GOAL Description: Pt will be free of pain Outcome: Progressing   Problem: Consults Goal: RH STROKE PATIENT EDUCATION Description: See Patient Education module for education specifics  Outcome: Progressing

## 2018-11-07 NOTE — Progress Notes (Signed)
Speech Language Pathology Daily Session Note  Patient Details  Name: Morgan Boyer MRN: [id number] Date of Birth: 1931-08-17  Today's Date: 11/07/2018 SLP Individual Time: 1430-1500 SLP Individual Time Calculation (min): 30 min  Short Term Goals: Week 2: SLP Short Term Goal 1 (Week 2): STGs=LTGs due to ELOS  Skilled Therapeutic Interventions:  Skilled treatment session focused on cognition goals. SLP facilitated session by providing supervision cues to complete semi-complex deductive reasoning puzzle. Pt continues to demonstrate appropriate anticipatory awareness as she describes d/c plan with Mod I recall of details. Pt left upright in recliner with alarm on and all needs within reach. Continue with current plan of care.      Pain    Therapy/Group: Individual Therapy  Taiten Brawn 11/07/2018, 3:06 PM

## 2018-11-07 NOTE — Progress Notes (Signed)
Physical Therapy Session Note  Patient Details  Name: Morgan Boyer MRN: [id number] Date of Birth: 1931/04/16  Today's Date: 11/07/2018 PT Individual Time: 1021-1116 and 1312-1405 PT Individual Time Calculation (min): 55 min and 53 min  Short Term Goals: Week 1:  PT Short Term Goal 1 (Week 1): Pt will perform bed mobility with supervision PT Short Term Goal 2 (Week 1): Pt will perform bed<>chair transfers with CGA PT Short Term Goal 3 (Week 1): Pt will ambulate at least 135ft using LRAD with CGA PT Short Term Goal 4 (Week 1): Pt will ascend/descended 4 steps using L handrail and CGA  Skilled Therapeutic Interventions/Progress Updates:  Treatment 1: Pt received in recliner & agreeable to tx. Pt reports need to use restroom and ambulates in room/bathroom with CGA but min assist 2/2 LOB when negotiating bathroom threshold. Pt completes toilet transfer with supervision, managing clothing without assistance, and with continent void on toilet. Pt performs hand hygiene standing at sink with supervision. Pt ambulates around unit with close supervision<>CGA without AD. Pt attempted to transition to quadruped on mat table but unable 2/2 B knee pain. While supine on mat table pt performed B bridging, and single leg bridging with each LE with cuing for technique and difficulty maintaining neutral alignment with LLE; pt completed 2 sets x 10 reps of single leg bridging. After transitioning supine>sitting EOM pt reports dizziness that goes away within 60 seconds with rest. Pt engages in cornhole with focus on functional use of LUE to reach for then toss weighted bean bags, as well as standing balance, with pt requiring CGA. Pt reports 6/10 fatigue & requires seated rest break. Pt utilized kinetron in sitting>standing with BUE>standing with LUE support & min assist with task focusing on upright posture, BLE strengthening & NMR, weight shifting & dynamic balance. At end of session pt left in recliner with chair alarm  donned & all needs at hand.  Treatment 2: Pt received in recliner with son present but exiting shortly after PT arrival. Pt's son denies questions at this time, confirms they are trying to work out supervision for pt at d/c, is agreeable to pt going to OPPT f/u, and therapist educated him on clearing all walkways of tripping hazards/secure or remove throw rugs with him reporting this was done years ago following pt's TKA. Pt transfers sit<>stand and ambulates room<>gym without AD & close supervision. Pt stands on compliant surface with close supervision for standing balance while engaging in task (folding towels & washcloths, peg board activity) focusing on forced use of LUE for strengthening & NMR. Pt utilized nu-step on level 4 x 8 minutes with all four extremities with focus on coordination of reciprocal movement, NMR & endurance training with extra focus on LUE maintaining grasp on handle. Pt engaged in ambulating while performing head turns/nods and quick directional changes with CGA<>min assist with pt demonstrating shuffled gait during retrograde gait despite cuing for increased foot clearance & step length. Back in room pt performs 3/3 toilteing tasks with continent void. Pt performs hand hygiene at sink with supervision. Pt left in recliner with chair alarm donned & all needs at hand.    Therapy Documentation Precautions:  Precautions Precautions: Fall Restrictions Weight Bearing Restrictions: No Other Position/Activity Restrictions: skin tear left hand, loop recorder surgical site chest     Therapy/Group: Individual Therapy  Waunita Schooner 11/07/2018, 2:08 PM

## 2018-11-08 ENCOUNTER — Inpatient Hospital Stay (HOSPITAL_COMMUNITY): Payer: PPO

## 2018-11-08 DIAGNOSIS — I63411 Cerebral infarction due to embolism of right middle cerebral artery: Secondary | ICD-10-CM

## 2018-11-08 NOTE — Progress Notes (Signed)
[place] PHYSICAL MEDICINE & REHABILITATION PROGRESS NOTE  Subjective/Complaints: No new complaints. Up in bed.   ROS: Patient denies fever, rash, sore throat, blurred vision, nausea, vomiting, diarrhea, cough, shortness of breath or chest pain, joint or back pain, headache, or mood change.    Objective: Vital Signs: Blood pressure 105/65, pulse 62, temperature 97.6 F (36.4 C), temperature source Oral, resp. rate 16, height 5\' 2"  (1.575 m), weight 65.3 kg, SpO2 96 %. No results found. No results for input(s): WBC, HGB, HCT, PLT in the last 72 hours. No results for input(s): NA, K, CL, CO2, GLUCOSE, BUN, CREATININE, CALCIUM in the last 72 hours.  Physical Exam: BP 105/65 (BP Location: Right Arm)   Pulse 62   Temp 97.6 F (36.4 C) (Oral)   Resp 16   Ht 5\' 2"  (1.575 m)   Wt 65.3 kg   SpO2 96%   BMI 26.33 kg/m  Constitutional: No distress . Vital signs reviewed. HEENT: EOMI, oral membranes moist Neck: supple Cardiovascular: RRR without murmur. No JVD    Respiratory: CTA Bilaterally without wheezes or rales. Normal effort    GI: BS +, non-tender, non-distended  Musc: No edema or tenderness in extremities. Neurological: Alert HOH, mild STM deficits. Improved processing speed Motor:  RUE/RLE: Grossly 4+/5, unchanged LUE:  4- to4/5 with improved FMC. LLE 4- to 4/5.--stable  Skin: 3cm skin tear clean left wrist.   Psychiatric: pleasant.  Assessment/Plan: 1. 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  Physiatrist is providing close team supervision and 24 hour management of active medical problems listed below.  Physiatrist and rehab team continue to assess barriers to discharge/monitor patient progress toward functional and medical goals  Care Tool:  Bathing    Body parts bathed by patient: Left arm, Chest, Front perineal area, Buttocks, Right upper leg,  Left upper leg, Face, Abdomen, Right lower leg, Left lower leg, Right arm   Body parts bathed by helper: Right arm, Right lower leg, Left lower leg     Bathing assist Assist Level: Contact Guard/Touching assist     Upper Body Dressing/Undressing Upper body dressing   What is the patient wearing?: Bra, Pull over shirt    Upper body assist Assist Level: Minimal Assistance - Patient > 75%    Lower Body Dressing/Undressing Lower body dressing      What is the patient wearing?: Underwear/pull up, Pants     Lower body assist Assist for lower body dressing: Contact Guard/Touching assist     Toileting Toileting    Toileting assist Assist for toileting: Supervision/Verbal cueing     Transfers Chair/bed transfer  Transfers assist     Chair/bed transfer assist level: Contact Guard/Touching assist     Locomotion Ambulation   Ambulation assist      Assist level: Contact Guard/Touching assist Assistive device: No Device Max distance: 100 FT   Walk 10 feet activity   Assist     Assist level: Contact Guard/Touching assist Assistive device: No Device   Walk 50 feet activity   Assist    Assist level: Contact Guard/Touching assist Assistive device: No Device    Walk 150 feet activity   Assist Walk 150 feet activity did not occur: Safety/medical concerns  Assist level: Contact Guard/Touching assist Assistive device: No Device    Walk 10 feet on uneven surface  activity   Assist     Assist level: Minimal Assistance - Patient >  75% Assistive device: Other (comment)(no device)   Wheelchair     Assist Will patient use wheelchair at discharge?: (TBD)             Wheelchair 50 feet with 2 turns activity    Assist            Wheelchair 150 feet activity     Assist            Medical Problem List and Plan: 1.Functional deficits, left hemiplegia, 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 TEE  revealed severe MR but no LA or LAA thrombus. Loop recorder placed to monitor for rhythmic/cardiac source of stroke.  -Continue CIR therapies including PT, OT, and SLP   -making steady gains. on track for ELOS 8/11 2. Antithrombotics: -DVT/anticoagulation:Pharmaceutical:Lovenox -antiplatelet therapy: DAPT                         -ASA 81 mg daily and plavix daily for 3 weeks then plavix alone 3. Pain Management:Tylenol prn 4. Mood:LCSW to follow for evaluation and support. -antipsychotic agents: NA 5. Neuropsych: This patientis?fully capable of making decisions on herown behalf. 6. Skin/Wound Care:Routine pressure relief measures.  -continue dressing to skin tear left wrist 7. Fluids/Electrolytes/Nutrition:Monitor I/O.  -intake reasonable   8. NPY:YFRTMYT BP bid--continue amlodipine and lisinopril  Controlled on 8/8  Monitor with increased mobility 9. GERD: Continue PPI. 10. Neuropathy: Managed with use of gabapentin and baclofen at nights.  Stable on 8/2 11. Hypokalemia:   Potassium  3.9 8/5  Supplemented x2 days.  -recheck monday 12.  Hypoalbuminemia  Supplement initiated on 8/1 13.  Acute blood loss anemia  Hemoglobin holding at 11.3 on 8/5  Continue to monitor   LOS: 8 days A FACE TO [facility] 11/08/2018, 10:44 AM

## 2018-11-08 NOTE — Progress Notes (Signed)
Chaplain attempted a visit, pt was still sleeping.

## 2018-11-08 NOTE — Progress Notes (Signed)
Occupational Therapy Weekly Progress Note  Patient Details  Name: Morgan Boyer MRN: [id number] Date of Birth: 1931/06/26  Beginning of progress report period: November 01, 2018 End of progress report period: November 08, 2018  Today's Date: 11/08/2018 OT Individual Time: 1050-1200 OT Individual Time Calculation (min): 70 min    Patient has met 3 of 3 short term goals.  Pt has made good progress towards LTGs and functional gains with mobility improving to S overall and use of LUE during ADLs to functional assist level. Pt is able to completes all BADLs at superviison level, however still demo deficits in LUE [facility] impacting efficiency and effort requires for UB dressing when donning bra.   Patient continues to demonstrate the following deficits: muscle weakness, decreased cardiorespiratoy endurance, impaired timing and sequencing, motor apraxia and decreased coordination, decreased safety awareness and delayed processing and decreased sitting balance, decreased standing balance, hemiplegia and decreased balance strategies and therefore will continue to benefit from skilled OT intervention to enhance overall performance with BADL and iADL.  Patient progressing toward long term goals..  Continue plan of care.  OT Short Term Goals Week 1:  OT Short Term Goal 1 (Week 1): patient will complete UB bathing and dressing with CS/set up, LB bathing and dressing with min A OT Short Term Goal 1 - Progress (Week 1): Met OT Short Term Goal 2 (Week 1): patient will perform functional transfers with CGA/CS OT Short Term Goal 2 - Progress (Week 1): Met OT Short Term Goal 3 (Week 1): patient will increase use of left UE to perform bathing/grooming using L UE as functional assist OT Short Term Goal 3 - Progress (Week 1): Met Week 2:    STG=LTG d/t ELOS  Skilled Therapeutic Interventions/Progress Updates:    1:1. Pt received in bathroom with NT present. Pt toilets, bathes and dresses with supervision and 1 instance  of CGA for tight turning. Pt bathes standing for majority of bath at sink to chalenge standing tolerance. Pt disorganized when gathering clothing leaving part of clothes at the dresser instead of bringing with her to EOB. Pt educated on gathering items night before and setting them out to decrease trips/energy waste. Pt completes changing of bed linens with supervision and no LOB. Pt maintains even pace and able to sequence and orient linens correctly, however continues to demo decreased organization when laying out object increasing trips. Pt completes 2x30 beach ball volley with 2# dowel rod with VC for keeping LUE as elevated as RUE. Exited session with t seated in reclienr, call light tin reach and exit alarm on.  Therapy Documentation Precautions:  Precautions Precautions: Fall Restrictions Weight Bearing Restrictions: No Other Position/Activity Restrictions: skin tear left hand, loop recorder surgical site chest General:   Vital Signs: Therapy Vitals Temp: 97.6 F (36.4 C) Temp Source: Oral Pulse Rate: 62 Resp: 16 BP: 105/65 Patient Position (if appropriate): Lying Oxygen Therapy SpO2: 96 % O2 Device: Room Air Pain:   ADL: ADL Eating: Set up Where Assessed-Eating: Wheelchair Grooming: Minimal assistance Where Assessed-Grooming: Sitting at sink Upper Body Bathing: Minimal assistance Where Assessed-Upper Body Bathing: Sitting at sink Lower Body Bathing: Moderate assistance Where Assessed-Lower Body Bathing: Sitting at sink, Standing at sink Upper Body Dressing: Moderate assistance Where Assessed-Upper Body Dressing: Wheelchair Lower Body Dressing: Maximal assistance Where Assessed-Lower Body Dressing: Wheelchair ADL Comments: patient declined shower due to loop recorder placement yesterday and concern for it getting wet Vision   Perception    Praxis   Exercises:  Other Treatments:     Therapy/Group: Individual Therapy  Tonny Branch 11/08/2018, 6:43 AM

## 2018-11-08 NOTE — Plan of Care (Signed)
  Problem: RH SKIN INTEGRITY Goal: RH STG SKIN FREE OF INFECTION/BREAKDOWN Description: Healing of skin tear to left hand Outcome: Progressing Goal: RH STG ABLE TO PERFORM INCISION/WOUND CARE W/ASSISTANCE Description: STG Able To Perform Incision/Wound Care With  [employer]. Outcome: Progressing   Problem: RH SAFETY Goal: RH STG ADHERE TO SAFETY PRECAUTIONS W/ASSISTANCE/DEVICE Description: STG Adhere to Safety Precautions With  Min Assistance/Device bed alarm and chair alarm Outcome: Progressing   Problem: RH PAIN MANAGEMENT Goal: RH STG PAIN MANAGED AT OR BELOW PT'S PAIN GOAL Description: Pt will be free of pain Outcome: Progressing   Problem: Consults Goal: RH STROKE PATIENT EDUCATION Description: See Patient Education module for education specifics  Outcome: Progressing

## 2018-11-09 ENCOUNTER — Inpatient Hospital Stay (HOSPITAL_COMMUNITY): Payer: PPO | Admitting: Physical Therapy

## 2018-11-09 ENCOUNTER — Inpatient Hospital Stay (HOSPITAL_COMMUNITY): Payer: PPO

## 2018-11-09 NOTE — Progress Notes (Signed)
Physical Therapy Discharge Summary  Patient Details  Name: Morgan Boyer MRN: [id number] Date of Birth: [birth year]  Today's Date: 11/10/2018    Patient has met 9 of 9 long term goals due to improved activity tolerance, improved balance, improved postural control, increased strength, ability to compensate for deficits, functional use of  left upper extremity and left lower extremity, improved attention, improved awareness and improved coordination.  Patient to discharge at an ambulatory level supervision without AD.   Patient's care partner is independent to provide the necessary physical and cognitive assistance at discharge.  Reasons goals not met: n/a  Recommendation:  Patient will benefit from ongoing skilled PT services in outpatient setting to continue to advance safe functional mobility, address ongoing impairments in balance, L NMR, endurance, strengthening, gait, coordination, and minimize fall risk.  Equipment: No equipment provided  Reasons for discharge: treatment goals met and discharge from hospital  Patient/family agrees with progress made and goals achieved: Yes  PT Discharge Precautions/Restrictions Precautions Precautions: Fall Restrictions Weight Bearing Restrictions: No Other Position/Activity Restrictions: loop recorder surgical site L chest  Vision/Perception  Pt wears glasses for reading only at baseline. Pt denies changes in baseline vision. Perception appears WNL.  Cognition Overall Cognitive Status: Within Functional Limits for tasks assessed Arousal/Alertness: Awake/alert Orientation Level: Oriented X4 Memory: Impaired Memory Impairment: Decreased short term memory;Decreased recall of new information Awareness: Impaired Awareness Impairment: Anticipatory impairment   Sensation Sensation & proprioception not tested.  Coordination Gross Motor Movements are Fluid and Coordinated: No(impaired LUE/LLE) Fine Motor Movements are Fluid and  Coordinated: No(impaired [place] distally on LUE) Coordination and Movement Description: impaired due to strength and sensation deficits   Motor  Motor Motor: Abnormal postural alignment and control Motor - Skilled Clinical Observations: left side weakness (UE>LE) Motor - Discharge Observations: left side weakness (UE>LE), generalized deconditioning   Mobility Bed Mobility Bed Mobility: Sit to Supine;Supine to Sit Rolling Right: mod I Supine to Sit: mod I Sit to Supine: mod I Transfers Transfers: Sit to Stand;Stand to Sit Sit to Stand: Supervision/Verbal cueing Stand to Sit: Supervision/Verbal cueing   Locomotion  Gait Ambulation: Yes Gait Assistance: Supervision/Verbal cueing Gait Distance (Feet): 150 Feet Assistive device: None Gait Assistance Details: Tactile cues for sequencing;Tactile cues for weight shifting;Verbal cues for technique;Verbal cues for gait pattern;Verbal cues for sequencing Gait Gait: Yes Gait Pattern: Impaired Gait Pattern: Decreased stride length;Decreased step length - left;Decreased step length - right(decreased weight shift L) Gait velocity: decreased Stairs / Additional Locomotion Stairs: Yes Stairs Assistance: Supervision/Verbal cueing Stair Management Technique: One rail Left;Sideways Number of Stairs: 12 Height of Stairs: 6(inches) Ramp: Supervision/Verbal cueing(ambulatory without AD) Wheelchair Mobility Wheelchair Mobility: No   Trunk/Postural Assessment  Cervical Assessment Cervical Assessment: Within Functional Limits Thoracic Assessment Thoracic Assessment: Exceptions to WFL(rounded shoulders) Postural Control Postural Control: Deficits on evaluation Righting Reactions: delayed Protective Responses: delayed   Balance Balance Balance Assessed: Yes Standardized Balance Assessment Standardized Balance Assessment: Berg Balance Test Berg Balance Test Sit to Stand: Able to stand without using hands and stabilize  independently Standing Unsupported: Able to stand safely 2 minutes Sitting with Back Unsupported but Feet Supported on Floor or Stool: Able to sit safely and securely 2 minutes Stand to Sit: Sits safely with minimal use of hands Transfers: Able to transfer safely, minor use of hands Standing Unsupported with Eyes Closed: Able to stand 10 seconds with supervision Standing Ubsupported with Feet Together: Able to place feet together independently and stand for 1 minute with supervision From Standing, Reach  Forward with Outstretched Arm: Can reach forward >5 cm safely (2") From Standing Position, Pick up Object from Floor: Able to pick up shoe, needs supervision From Standing Position, Turn to Look Behind Over each Shoulder: Turn sideways only but maintains balance Turn 360 Degrees: Able to turn 360 degrees safely but slowly Standing Unsupported, Alternately Place Feet on Step/Stool: Able to complete >2 steps/needs minimal assist Standing Unsupported, One Foot in Front: Able to take small step independently and hold 30 seconds(pt chose to take small step) Standing on One Leg: Unable to try or needs assist to prevent fall Total Score: 38   Berg Balance Test on 11/03/2018 = 31/56 Berg Balance Test on 11/09/2018 = 38/56  Five Time Sit to Stand on 11/06/2018:  First trial = 12.95 seconds Second Trial = 8.51 seconds   Extremity Assessment  RUE Assessment RUE Assessment: Within Functional Limits  Per OT assessment:  LUE Assessment LUE Assessment: Exceptions to [facility] LUE Body System: Neuro Brunstrum levels for arm and hand: Arm;Hand Brunstrum level for arm: Stage V Relative Independence from [employer] Brunstrum level for hand: Stage VI Isolated joint movements  Impaired coordination distally on LUE, but pt able to use LUE in functional context.  RLE Assessment RLE Assessment: Within Functional Limits LLE Assessment LLE Assessment: hip flexion, knee flexion, knee extension all 4+/5, ankle  dorsiflexion 3+/5   Waunita Schooner 11/10/2018, 1:33 PM

## 2018-11-09 NOTE — Progress Notes (Signed)
Slept good. PRN tylenol given at 2035. Ambulating to BR with HHA. Morgan Boyer

## 2018-11-09 NOTE — Progress Notes (Signed)
Physical Therapy Session Note  Patient Details  Name: Morgan Boyer MRN: [id number] Date of Birth: March 14, 1932  Today's Date: 11/09/2018 PT Individual Time: 0804-0900 PT Individual Time Calculation (min): 56 min   Short Term Goals: Week 1:  PT Short Term Goal 1 (Week 1): Pt will perform bed mobility with supervision PT Short Term Goal 2 (Week 1): Pt will perform bed<>chair transfers with CGA PT Short Term Goal 3 (Week 1): Pt will ambulate at least 113ft using LRAD with CGA PT Short Term Goal 4 (Week 1): Pt will ascend/descended 4 steps using L handrail and CGA  Skilled Therapeutic Interventions/Progress Updates:  Pt received in recliner & agreeable to tx. Pt requesting to get dressed and does so with supervision & cuing to sit in recliner vs standing when possible.  Pt ambulates around unit without AD & supervision, pt demonstrates lateral sway. Pt completes Berg Balance Test & scores 38/56; educated pt on improvement (7 points) & interpretation in score & current fall risk. Patient demonstrates increased fall risk as noted by score of 38/56 on Berg Balance Scale.  (<36= high risk for falls, close to 100%; 37-45 significant >80%; 46-51 moderate >50%; 52-55 lower >25%). Pt negotiates 12 steps (6") laterally with L rail with supervision & reports comfort with task. Pt declined attempting floor transfer 2/2 pain in knees when in quadruped position. Pt engaged in tandem stance in parallel bars without BUE support and min assist with focus on balance. Pt demonstrates impaired short term memory on this date (cuing for pathfinding back to room, did not recall taking balance test ~1 week ago). At end of session pt left in recliner with chair alarm donned & all needs at hand.    Therapy Documentation Precautions:  Precautions Precautions: Fall Restrictions Weight Bearing Restrictions: No Other Position/Activity Restrictions: loop recorder surgical site L chest  Pain: No c/o pain reported during  session.   Balance: Balance Balance Assessed: Yes Standardized Balance Assessment Standardized Balance Assessment: Berg Balance Test Berg Balance Test Sit to Stand: Able to stand without using hands and stabilize independently Standing Unsupported: Able to stand safely 2 minutes Sitting with Back Unsupported but Feet Supported on Floor or Stool: Able to sit safely and securely 2 minutes Stand to Sit: Sits safely with minimal use of hands Transfers: Able to transfer safely, minor use of hands Standing Unsupported with Eyes Closed: Able to stand 10 seconds with supervision Standing Ubsupported with Feet Together: Able to place feet together independently and stand for 1 minute with supervision From Standing, Reach Forward with Outstretched Arm: Can reach forward >5 cm safely (2") From Standing Position, Pick up Object from Floor: Able to pick up shoe, needs supervision From Standing Position, Turn to Look Behind Over each Shoulder: Turn sideways only but maintains balance Turn 360 Degrees: Able to turn 360 degrees safely but slowly Standing Unsupported, Alternately Place Feet on Step/Stool: Able to complete >2 steps/needs minimal assist Standing Unsupported, One Foot in Front: Able to take small step independently and hold 30 seconds(pt chose to take small step) Standing on One Leg: Unable to try or needs assist to prevent fall Total Score: 38     Therapy/Group: Individual Therapy  Waunita Schooner 11/09/2018, 9:01 AM

## 2018-11-09 NOTE — Plan of Care (Signed)
  Problem: RH SKIN INTEGRITY Goal: RH STG SKIN FREE OF INFECTION/BREAKDOWN Description: Healing of skin tear to left hand Outcome: Progressing Goal: RH STG ABLE TO PERFORM INCISION/WOUND CARE W/ASSISTANCE Description: STG Able To Perform Incision/Wound Care With  [employer]. Outcome: Progressing   Problem: RH SAFETY Goal: RH STG ADHERE TO SAFETY PRECAUTIONS W/ASSISTANCE/DEVICE Description: STG Adhere to Safety Precautions With  supervision Assistance/Device bed alarm and chair alarm Outcome: Progressing   Problem: RH PAIN MANAGEMENT Goal: RH STG PAIN MANAGED AT OR BELOW PT'S PAIN GOAL Description: Pt will be free of pain Outcome: Progressing   Problem: Consults Goal: RH STROKE PATIENT EDUCATION Description: See Patient Education module for education specifics  Outcome: Progressing

## 2018-11-09 NOTE — Progress Notes (Signed)
Occupational Therapy Session Note  Patient Details  Name: Morgan Boyer MRN: [id number] Date of Birth: 26-Feb-1932  Today's Date: 11/09/2018 OT Individual Time: 1000-1057 OT Individual Time Calculation (min): 57 min    Short Term Goals: Week 2:  OT Short Term Goal 1 (Week 2): STG=LTG d/t ELOS  Skilled Therapeutic Interventions/Progress Updates:    Pt received standing at sink with NT Erin. Pt completed functional mobility around room, retrieving clothes for shower with close (S). Pt able to accurately choose and organize clothes. Pt reporting she is not feeling like herself, asking for some coke to "perk up". Soda provided and pt drank quickly before transferring into shower with (S). Pt completed all bathing sit <> stand in shower with (S). Pt completed overhead hair washing with BUE, no cueing requiring for LUE use. Pt sat EOB and donned bra with mod I. Pt able to don underwear and pants with (S) from EOB. Shirt donned mod I. Pt able to complete oral care at sink with (S) in standing. Discussed d/c planning and family edu session tomorrow with pt. Pt returned to sitting up in recliner and was left with all needs met, chair alarm set.   Therapy Documentation Precautions:  Precautions Precautions: Fall Restrictions Weight Bearing Restrictions: No Other Position/Activity Restrictions: skin tear left hand, loop recorder surgical site chest   Therapy/Group: Individual Therapy  Curtis Sites 11/09/2018, 7:20 AM

## 2018-11-09 NOTE — Progress Notes (Signed)
[place] PHYSICAL MEDICINE & REHABILITATION PROGRESS NOTE  Subjective/Complaints: No new issues. Anxious to get home  ROS: Patient denies fever, rash, sore throat, blurred vision, nausea, vomiting, diarrhea, cough, shortness of breath or chest pain, joint or back pain, headache, or mood change.     Objective: Vital Signs: Blood pressure (!) 110/58, pulse 64, temperature 97.8 F (36.6 C), resp. rate 16, height 5\' 2"  (1.575 m), weight 65.3 kg, SpO2 99 %. No results found. No results for input(s): WBC, HGB, HCT, PLT in the last 72 hours. No results for input(s): NA, K, CL, CO2, GLUCOSE, BUN, CREATININE, CALCIUM in the last 72 hours.  Physical Exam: BP (!) 110/58 (BP Location: Right Arm)   Pulse 64   Temp 97.8 F (36.6 C)   Resp 16   Ht 5\' 2"  (1.575 m)   Wt 65.3 kg   SpO2 99%   BMI 26.33 kg/m  Constitutional: No distress . Vital signs reviewed. HEENT: EOMI, oral membranes moist Neck: supple Cardiovascular: RRR without murmur. No JVD    Respiratory: CTA Bilaterally without wheezes or rales. Normal effort    GI: BS +, non-tender, non-distended  Musc: No edema or tenderness in extremities. Neurological: Alert HOH, mild STM deficits. Improved processing speed Motor:  RUE/RLE: Grossly 4+/5, unchanged LUE:  4- to4/5 with improved FMC. LLE 4- to 4/5.--improving Skin: 3cm skin tear clean left wrist.   Psychiatric:pleasant Assessment/Plan: 1. 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  Physiatrist is providing close team supervision and 24 hour management of active medical problems listed below.  Physiatrist and rehab team continue to assess barriers to discharge/monitor patient progress toward functional and medical goals  Care Tool:  Bathing    Body parts bathed by patient: Left arm, Chest, Front perineal area, Buttocks, Right upper leg, Left upper leg, Face,  Abdomen, Right lower leg, Left lower leg, Right arm   Body parts bathed by helper: Right arm, Right lower leg, Left lower leg     Bathing assist Assist Level: Contact Guard/Touching assist     Upper Body Dressing/Undressing Upper body dressing   What is the patient wearing?: Bra, Pull over shirt    Upper body assist Assist Level: Minimal Assistance - Patient > 75%    Lower Body Dressing/Undressing Lower body dressing      What is the patient wearing?: Underwear/pull up, Pants     Lower body assist Assist for lower body dressing: Contact Guard/Touching assist     Toileting Toileting    Toileting assist Assist for toileting: Supervision/Verbal cueing     Transfers Chair/bed transfer  Transfers assist     Chair/bed transfer assist level: Supervision/Verbal cueing     Locomotion Ambulation   Ambulation assist      Assist level: Supervision/Verbal cueing Assistive device: No Device Max distance: 100 ft   Walk 10 feet activity   Assist     Assist level: Supervision/Verbal cueing Assistive device: No Device   Walk 50 feet activity   Assist    Assist level: Supervision/Verbal cueing Assistive device: No Device    Walk 150 feet activity   Assist Walk 150 feet activity did not occur: Safety/medical concerns  Assist level: Contact Guard/Touching assist Assistive device: No Device    Walk 10 feet on uneven surface  activity   Assist     Assist level: Minimal Assistance - Patient > 75% Assistive device: Other (comment)(no device)  Wheelchair     Assist Will patient use wheelchair at discharge?: (TBD)             Wheelchair 50 feet with 2 turns activity    Assist            Wheelchair 150 feet activity     Assist            Medical Problem List and Plan: 1.Functional deficits, left hemiplegia, 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 TEE revealed severe MR but no LA or LAA thrombus.  Loop recorder placed to monitor for rhythmic/cardiac source of stroke.  -Continue CIR therapies including PT, OT, and SLP   -making steady gains. on track for ELOS 8/11 2. Antithrombotics: -DVT/anticoagulation:Pharmaceutical:Lovenox -antiplatelet therapy: DAPT                         -ASA 81 mg daily and plavix daily for 3 weeks then plavix alone 3. Pain Management:Tylenol prn 4. Mood:LCSW to follow for evaluation and support. -antipsychotic agents: NA 5. Neuropsych: This patientis?fully capable of making decisions on herown behalf. 6. Skin/Wound Care:Routine pressure relief measures.  -continue dressing to skin tear left wrist 7. Fluids/Electrolytes/Nutrition:Monitor I/O.  -intake reasonable   8. OHY:WVPXTGG BP bid--continue amlodipine and lisinopril  Controlled on 8/9  Monitor with increased mobility 9. GERD: Continue PPI. 10. Neuropathy: Managed with use of gabapentin and baclofen at nights.  Stable on 8/2 11. Hypokalemia:   Potassium  3.9 8/5  Supplemented x2 days.  -recheck monday 12.  Hypoalbuminemia  Supplement initiated on 8/1 13.  Acute blood loss anemia  Hemoglobin holding at 11.3 on 8/5  Continue to monitor   LOS: 9 days [place] 11/09/2018, 10:13 AM

## 2018-11-10 ENCOUNTER — Encounter (HOSPITAL_COMMUNITY): Payer: PPO | Admitting: Speech Pathology

## 2018-11-10 ENCOUNTER — Ambulatory Visit (HOSPITAL_COMMUNITY): Payer: PPO | Admitting: Physical Therapy

## 2018-11-10 ENCOUNTER — Encounter (HOSPITAL_COMMUNITY): Payer: PPO

## 2018-11-10 ENCOUNTER — Inpatient Hospital Stay (HOSPITAL_COMMUNITY): Payer: PPO | Admitting: Physical Therapy

## 2018-11-10 NOTE — Plan of Care (Signed)
  Problem: RH SKIN INTEGRITY Goal: RH STG SKIN FREE OF INFECTION/BREAKDOWN Description: Healing of skin tear to left hand Outcome: Progressing Goal: RH STG ABLE TO PERFORM INCISION/WOUND CARE W/ASSISTANCE Description: STG Able To Perform Incision/Wound Care With  [employer]. Outcome: Progressing   Problem: RH SAFETY Goal: RH STG ADHERE TO SAFETY PRECAUTIONS W/ASSISTANCE/DEVICE Description: STG Adhere to Safety Precautions With  supervision Assistance/Device bed alarm and chair alarm Outcome: Progressing   Problem: RH PAIN MANAGEMENT Goal: RH STG PAIN MANAGED AT OR BELOW PT'S PAIN GOAL Description: Pt will be free of pain Outcome: Progressing   Problem: RH KNOWLEDGE DEFICIT Goal: RH STG INCREASE KNOWLEDGE OF HYPERTENSION Description: Pt will be able to verbalize 2 ways to decrease hypertension with compliance of diet and medication prior to DC  Outcome: Progressing Goal: RH STG INCREASE KNOWLEGDE OF HYPERLIPIDEMIA Description: Pt will be able to verbalize 2 ways to decrease hyperlipidemia along with diet control and medication management Outcome: Progressing Goal: RH STG INCREASE KNOWLEDGE OF STROKE PROPHYLAXIS Description: Pt will be able to verbalize signs of stroke and how to reduce risk of stroke with diet compliance and medication regimen Outcome: Progressing   Problem: Consults Goal: RH STROKE PATIENT EDUCATION Description: See Patient Education module for education specifics  Outcome: Progressing

## 2018-11-10 NOTE — Progress Notes (Signed)
Speech Language Pathology Discharge Summary  Patient Details  Name: Morgan Boyer MRN: [id number] Date of Birth: 1931/06/23  Today's Date: 11/10/2018 SLP Individual Time: 1000-1055 SLP Individual Time Calculation (min): 55 min   Skilled Therapeutic Interventions:  Skilled treatment session focused on cognitive goals and completion of family education with the patient's son. SLP facilitated session by re-administering the Cognistat. Patient scored WFL on all subtests and has made progress since initial evaluation. Patient's son present and educated in regards to patient's current cognitive functioning and strategies to utilize at home to maximize recall, problem solving and overall safety with functional and familiar tasks. Both the patient and her son verbalized understanding of all information. Patient left upright in wheelchair with all needs within reach.   Patient has met 2 of 2 long term goals.  Patient to discharge at overall Modified Independent level.   Reasons goals not met: N/A   Clinical Impression/Discharge Summary: Patient has made excellent gains and has met 2 of 2 LTGs this admission.  Currently, patient requires supervision level verbal cues for use of memory compensatory strategies to recall new, daily information but can demonstrate functional problem solving for complex and familiar tasks with Mod I. Patient and family education is complete and patient will discharge home with assistance from family. Suspect patient is at her baseline level of cognitive functioning, therefore, skilled SLP f/u is not warranted at this time.   Care Partner:  Caregiver Able to Provide Assistance: Yes  Type of Caregiver Assistance: Physical  Recommendation:  None      Equipment: N/A   Reasons for discharge: Treatment goals met;Discharged from hospital   Patient/Family Agrees with Progress Made and Goals Achieved: Yes    [place] 11/10/2018, 6:55 AM

## 2018-11-10 NOTE — Progress Notes (Addendum)
Physical Therapy Session Note  Patient Details  Name: Morgan Boyer MRN: [id number] Date of Birth: 08-16-1931  Today's Date: 11/10/2018 PT Individual Time: 0905-1000 and 6270-3500 PT Individual Time Calculation (min): 55 min and 27 min  Short Term Goals: Week 1:  PT Short Term Goal 1 (Week 1): Pt will perform bed mobility with supervision PT Short Term Goal 2 (Week 1): Pt will perform bed<>chair transfers with CGA PT Short Term Goal 3 (Week 1): Pt will ambulate at least 126ft using LRAD with CGA PT Short Term Goal 4 (Week 1): Pt will ascend/descended 4 steps using L handrail and CGA  Skilled Therapeutic Interventions/Progress Updates:  Treatment 1: Pt received in room with son Morgan Boyer) present for caregiver training, but he stepped out to allow pt to get dressed with supervision from therapist. No c/o pain reported during session. Educated pt & Morgan Boyer on pt's improvement in [place] Test score & interpretation of score & current fall risk, recommendation for supervision, reviewed getting up from a fall & when to get checked out by EMS, & educated on risks/signs of another stroke. Pt ambulates around unit without AD & supervision, completes car transfer at SUV simulated height with supervision, negotiates ramp & mulch without AD & supervision, & negotiates 8 steps laterally with L rail & supervision. Provided pt with OTAGO Level A HEP with pt performing all exercises with cuing for technique. Discussed pt's need to use restroom multiple times per night & this therapist's recommendation of using BSC at night vs ambulating to/from bathroom with rollator, as well as education to ensure adequate lighting & ensure pt is alert before toileting at night. Also educated Morgan Boyer on pt's decreased recall of new information. Pt & Morgan Boyer both voice understanding of all information & no concerns reported. Pt left in room in handoff to SLP.  Treatment 2: Pt received in recliner, reporting fatigue, but agreeable to tx.  No c/o pain reported. Tested pt's LLE strength, please see d/c summary. Pt ambulates room<>dayroom without AD & supervision. Pt utilized nu-step on level 5 x 10 minutes with focus on L NMR, global strengthening, coordination of reciprocal movements, & endurance training. Therapist provides cuing/encouragement for pt to maintain LUE grasp on handle. Back in room, pt ambulates in/out of bathroom with supervision, no AD, completes 3/3 toileting steps with distant supervision and continent void. Pt performs hand hygiene standing at sink with distant supervision. Pt completes bed mobility with mod I with bed flat, no rails. Pt left in bed with alarm set & all needs at hand.   Therapy Documentation Precautions:  Precautions Precautions: Fall Restrictions Weight Bearing Restrictions: No Other Position/Activity Restrictions: loop recorder surgical site L chest   Therapy/Group: Individual Therapy  Waunita Schooner 11/10/2018, 1:33 PM

## 2018-11-10 NOTE — Progress Notes (Signed)
[facility] PHYSICAL MEDICINE & REHABILITATION PROGRESS NOTE  Subjective/Complaints: Up in chair. Doing some packing. No complaints  ROS: Patient denies fever, rash, sore throat, blurred vision, nausea, vomiting, diarrhea, cough, shortness of breath or chest pain, joint or back pain, headache, or mood change.     Objective: Vital Signs: Blood pressure (!) 106/53, pulse 69, temperature 98.4 F (36.9 C), temperature source Oral, resp. rate 14, height 5\' 2"  (1.575 m), weight 66.6 kg, SpO2 98 %. No results found. No results for input(s): WBC, HGB, HCT, PLT in the last 72 hours. No results for input(s): NA, K, CL, CO2, GLUCOSE, BUN, CREATININE, CALCIUM in the last 72 hours.  Physical Exam: BP (!) 106/53 (BP Location: Right Arm)   Pulse 69   Temp 98.4 F (36.9 C) (Oral)   Resp 14   Ht 5\' 2"  (1.575 m)   Wt 66.6 kg   SpO2 98%   BMI 26.85 kg/m  Constitutional: No distress . Vital signs reviewed. HEENT: EOMI, oral membranes moist Neck: supple Cardiovascular: RRR without murmur. No JVD    Respiratory: CTA Bilaterally without wheezes or rales. Normal effort    GI: BS +, non-tender, non-distended  Musc: No edema or tenderness in extremities. Neurological: Alert HOH, mild STM deficits. Improved processing speed Motor:  RUE/RLE: Grossly 4+/5, unchanged LUE:  4- to4/5 with improved FMC. LLE 4- to 4/5.--continues to improve Skin: 3cm skin tear clean left wrist. dressed  Psychiatric:pleasant   Assessment/Plan: 1. 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  Physiatrist is providing close team supervision and 24 hour management of active medical problems listed below.  Physiatrist and rehab team continue to assess barriers to discharge/monitor patient progress toward functional and medical goals  Care Tool:  Bathing    Body parts bathed by patient: Left arm, Chest,  Front perineal area, Buttocks, Right upper leg, Left upper leg, Face, Abdomen, Right lower leg, Left lower leg, Right arm   Body parts bathed by helper: Right arm, Right lower leg, Left lower leg     Bathing assist Assist Level: Supervision/Verbal cueing     Upper Body Dressing/Undressing Upper body dressing   What is the patient wearing?: Bra, Pull over shirt    Upper body assist Assist Level: Independent    Lower Body Dressing/Undressing Lower body dressing      What is the patient wearing?: Underwear/pull up, Pants     Lower body assist Assist for lower body dressing: Supervision/Verbal cueing     Toileting Toileting    Toileting assist Assist for toileting: Supervision/Verbal cueing     Transfers Chair/bed transfer  Transfers assist     Chair/bed transfer assist level: Supervision/Verbal cueing     Locomotion Ambulation   Ambulation assist      Assist level: Supervision/Verbal cueing Assistive device: No Device Max distance: 100 ft   Walk 10 feet activity   Assist     Assist level: Supervision/Verbal cueing Assistive device: No Device   Walk 50 feet activity   Assist    Assist level: Supervision/Verbal cueing Assistive device: No Device    Walk 150 feet activity   Assist Walk 150 feet activity did not occur: Safety/medical concerns  Assist level: Contact Guard/Touching assist Assistive device: No Device    Walk 10 feet on uneven surface  activity   Assist     Assist level: Minimal Assistance - Patient > 75% Assistive device: Other (comment)(no  device)   Wheelchair     Assist Will patient use wheelchair at discharge?: (TBD)             Wheelchair 50 feet with 2 turns activity    Assist            Wheelchair 150 feet activity     Assist            Medical Problem List and Plan: 1.Functional deficits, left hemiplegia, 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 TEE  revealed severe MR but no LA or LAA thrombus. Loop recorder placed to monitor for rhythmic/cardiac source of stroke.  -Continue CIR therapies including PT, OT, and SLP   -making steady gains. on track for ELOS 8/11  -Patient to see Rehab MD/provider in the office for transitional care encounter in 1-2 weeks.  2. Antithrombotics: -DVT/anticoagulation:Pharmaceutical:Lovenox -antiplatelet therapy: DAPT                         -ASA 81 mg daily and plavix daily for 3 weeks then plavix alone 3. Pain Management:Tylenol prn 4. Mood:LCSW to follow for evaluation and support. -antipsychotic agents: NA 5. Neuropsych: This patientis?fully capable of making decisions on herown behalf. 6. Skin/Wound Care:Routine pressure relief measures.  -continue dressing to skin tear left wrist 7. Fluids/Electrolytes/Nutrition:Monitor I/O.  -intake reasonable   8. SUP:JSRPRXY BP bid--continue amlodipine and lisinopril  Controlled on 8/10  Monitor with increased mobility 9. GERD: Continue PPI. 10. Neuropathy: Managed with use of gabapentin and baclofen at nights.  Stable on 8/2 11. Hypokalemia:   Potassium  3.9 8/  Supplemented x2 days.  -labs pending today 12.  Hypoalbuminemia  Supplement initiated on 8/1 13.  Acute blood loss anemia  Hemoglobin holding at 11.3 on 8/5  Continue to monitor   LOS: 10 days [place] 11/10/2018, 10:32 AM

## 2018-11-10 NOTE — Progress Notes (Signed)
Occupational Therapy Discharge Summary  Patient Details  Name: Morgan Boyer MRN: [id number] Date of Birth: 1931/04/15  Today's Date: 11/10/2018 OT Individual Time: 1100-1200 OT Individual Time Calculation (min): 60 min    Patient has met 13 of 14 long term goals due to improved activity tolerance, improved balance, postural control, ability to compensate for deficits, functional use of  RIGHT upper and RIGHT lower extremity, improved attention, improved awareness and improved coordination.  Patient to discharge at overall Supervision /modified independent level.  Patient's care partner is independent to provide the necessary physical assistance at discharge.    Reasons goals not met: Pt still requires (S) with dynamic standing balance d/t perturbations in postural sway.   Recommendation:  Patient will benefit from ongoing skilled OT services in outpatient setting to continue to advance functional skills in the area of BADL and iADL.  Equipment: No equipment provided  Reasons for discharge: treatment goals met and discharge from hospital  Patient/family agrees with progress made and goals achieved: Yes   Skilled OT Intervention:  Session focused on family education with pt's son Nicole Kindred present and engaged throughout. Provided general education on fall prevention strategies, energy conservation (handout provided and reviewed), and CVA recovery/continued rehab. HEP provided and reviewed with pt for theraputty program and on general [facility] work. Pt indicated understanding and returned demonstration. Pt completed 200 ft of functional mobility with (S) down to ADL apt. Discussed tub transfer and use of grab bars/equipment. Pt completed transfer with (S). Pt was left sitting up with all needs met, son present.   OT Discharge Precautions/Restrictions  Precautions Precautions: Fall Restrictions Weight Bearing Restrictions: No Other Position/Activity Restrictions: loop recorder surgical site L  chest   Vital Signs Therapy Vitals Pulse Rate: 69 BP: (!) 106/53 Patient Position (if appropriate): Sitting Oxygen Therapy SpO2: 98 % O2 Device: Room Air Pain Pain Assessment Pain Scale: 0-10 Pain Score: 0-No pain ADL ADL Eating: Modified independent Where Assessed-Eating: Wheelchair Grooming: Modified independent Where Assessed-Grooming: Sitting at sink Upper Body Bathing: Modified independent Where Assessed-Upper Body Bathing: Shower Lower Body Bathing: Modified independent Where Assessed-Lower Body Bathing: Shower Upper Body Dressing: Modified independent (Device) Where Assessed-Upper Body Dressing: Edge of bed Lower Body Dressing: Supervision/safety Where Assessed-Lower Body Dressing: Edge of bed Toileting: Modified independent Where Assessed-Toileting: Glass blower/designer: Distant supervision Armed forces technical officer Method: Magazine features editor: Distant supervision Social research officer, government Method: Ambulating ADL Comments: patient declined shower due to loop recorder placement yesterday and concern for it getting wet Vision Baseline Vision/History: Wears glasses Wears Glasses: Reading only Patient Visual Report: No change from baseline Vision Assessment?: No apparent visual deficits Perception  Perception: Impaired Inattention/Neglect: (slightly decreased attention to L) Praxis Praxis: Intact Cognition Overall Cognitive Status: Within Functional Limits for tasks assessed Arousal/Alertness: Awake/alert Orientation Level: Oriented X4 Attention: Sustained Sustained Attention: Appears intact Memory: Impaired Memory Impairment: Decreased short term memory;Decreased recall of new information Decreased Short Term Memory: Functional complex Awareness: Impaired Awareness Impairment: Anticipatory impairment Problem Solving: Impaired Problem Solving Impairment: Verbal complex Safety/Judgment: Appears intact Sensation Sensation Light Touch: Impaired by  gross assessment(reports some impaired sensation in the LUE) Proprioception: Impaired by gross assessment(impaired LUE/LLE) Coordination Gross Motor Movements are Fluid and Coordinated: No Fine Motor Movements are Fluid and Coordinated: No Coordination and Movement Description: impaired due to strength and sensation deficits Motor  Motor Motor: Abnormal postural alignment and control Motor - Discharge Observations: left side weakness (UE>LE), generalized deconditioning Mobility  Bed Mobility Bed Mobility: Sit to Supine;Supine to [employer]  Left: Supervision/Verbal cueing Left Sidelying to Sit: Supervision/Verbal cueing Supine to Sit: Supervision/Verbal cueing Sit to Supine: Supervision/Verbal cueing Transfers Sit to Stand: Supervision/Verbal cueing Stand to Sit: Supervision/Verbal cueing  Trunk/Postural Assessment  Cervical Assessment Cervical Assessment: Within Functional Limits Thoracic Assessment Thoracic Assessment: Exceptions to WFL(rounded shoulders) Lumbar Assessment Lumbar Assessment: Within Functional Limits Postural Control Postural Control: Deficits on evaluation Righting Reactions: delayed Protective Responses: delayed  Balance Balance Balance Assessed: Yes Static Sitting Balance Static Sitting - Balance Support: Feet supported Static Sitting - Level of Assistance: 6: Modified independent (Device/Increase time) Dynamic Sitting Balance Dynamic Sitting - Balance Support: Feet supported Dynamic Sitting - Level of Assistance: 6: Modified independent (Device/Increase time) Static Standing Balance Static Standing - Balance Support: During functional activity Static Standing - Level of Assistance: 5: Stand by assistance Dynamic Standing Balance Dynamic Standing - Balance Support: During functional activity Dynamic Standing - Level of Assistance: 5: Stand by assistance Extremity/Trunk Assessment RUE Assessment RUE Assessment: Within Functional Limits LUE  Assessment LUE Assessment: Exceptions to [facility] LUE Body System: Neuro Brunstrum levels for arm and hand: Arm;Hand Brunstrum level for arm: Stage V Relative Independence from Synergy Brunstrum level for hand: Stage VI Isolated joint movements   Curtis Sites 11/10/2018, 12:25 PM

## 2018-11-11 ENCOUNTER — Other Ambulatory Visit (HOSPITAL_COMMUNITY): Payer: PPO

## 2018-11-11 ENCOUNTER — Encounter (HOSPITAL_COMMUNITY): Payer: Self-pay | Admitting: *Deleted

## 2018-11-11 MED ORDER — PANTOPRAZOLE SODIUM 40 MG PO TBEC: 40.0000 mg | DELAYED_RELEASE_TABLET | Freq: Every day | ORAL | 1 refills | Status: DC

## 2018-11-11 MED ORDER — CLOPIDOGREL BISULFATE 75 MG PO TABS: 75.0000 mg | ORAL_TABLET | Freq: Every day | ORAL | 0 refills | Status: DC

## 2018-11-11 MED ORDER — ASPIRIN 81 MG PO TABS: 81.0000 mg | ORAL_TABLET | Freq: Every day | ORAL | 0 refills | Status: DC

## 2018-11-11 MED ORDER — AMLODIPINE BESYLATE 10 MG PO TABS: 10.0000 mg | ORAL_TABLET | Freq: Every day | ORAL | 1 refills | Status: DC

## 2018-11-11 MED ORDER — LISINOPRIL 20 MG PO TABS: 20.0000 mg | ORAL_TABLET | Freq: Two times a day (BID) | ORAL | 1 refills | Status: DC

## 2018-11-11 MED ORDER — MUSCLE RUB 10-15 % EX CREA: 1.0000 "application " | TOPICAL_CREAM | CUTANEOUS | 0 refills | Status: DC | PRN

## 2018-11-11 MED ORDER — ACETAMINOPHEN 325 MG PO TABS: 325.0000 mg | ORAL_TABLET | ORAL | Status: AC | PRN

## 2018-11-11 NOTE — Progress Notes (Signed)
Patient discharged to home, accompanied by her son. 

## 2018-11-11 NOTE — Discharge Instructions (Signed)
Inpatient Rehab Discharge Instructions  Morgan Boyer Discharge date and time: 11/11/18    Activities/Precautions/ Functional Status: Activity: no lifting, driving, or strenuous exercise till cleared by MD Diet: cardiac diet Wound Care:  Keep wound clean and dry   Functional status:  ___ No restrictions     ___ Walk up steps independently _X__ 24/7 supervision/assistance   ___ Walk up steps with assistance ___ Intermittent supervision/assistance  ___ Bathe/dress independently ___ Walk with walker     ___ Bathe/dress with assistance ___ Walk Independently    ___ Shower independently ___ Walk with assistance    _X__ Shower with assistance _X__ No alcohol     ___ Return to work/school ________   Special Instructions:  COMMUNITY REFERRALS UPON DISCHARGE:   Outpatient: PT     OT      Agency:  [facility]                            [address], [place]                            [place], [place]  [address]              Phone:  [phone]              Appointment Date/Time:  11-17-18 8:00am & 8:45am for PT and OT Medical Equipment/Items Ordered: you have all recommended equipment at home    [facility] PATIENT/FAMILY: Support Groups:  [facility] Stroke Support Group - on hole for COVID-19                              Meets the second Thursday of each month from 6 - 7 PM (except June, July, August)                              [facility] [place], [address], [place]                              For more information, call [phone]     STROKE/TIA DISCHARGE INSTRUCTIONS SMOKING Cigarette smoking nearly doubles your risk of having a stroke & is the single most alterable risk factor  If you smoke or have smoked in the last 12 months, you are advised to quit smoking for your health.  Most of the excess cardiovascular risk related to smoking disappears within a year of  stopping.  Ask you doctor about anti-smoking medications  [place] Quit Line: 1-800-QUIT NOW  Free Smoking Cessation Classes [phone]  CHOLESTEROL Know your levels; limit fat & cholesterol in your diet  Lipid Panel     Component Value Date/Time   CHOL 149 10/26/2018 0541   TRIG 52 10/26/2018 0541   HDL 55 10/26/2018 0541   CHOLHDL 2.7 10/26/2018 0541   VLDL 10 10/26/2018 0541   LDLCALC 84 10/26/2018 0541      Many patients benefit from treatment even if their cholesterol is at goal.  Goal: Total Cholesterol (CHOL) less than 160  Goal:  Triglycerides (TRIG) less than 150  Goal:  HDL greater than 40  Goal:  LDL (LDLCALC) less than 100   BLOOD PRESSURE American  Stroke Association blood pressure target is less that 120/80 mm/Hg  Your discharge blood pressure is:  BP: 118/76  Monitor your blood pressure  Limit your salt and alcohol intake  Many individuals will require more than one medication for high blood pressure  DIABETES (A1c is a blood sugar average for last 3 months) Goal HGBA1c is under 7% (HBGA1c is blood sugar average for last 3 months)  Diabetes: Pre-diabetes    Lab Results  Component Value Date   HGBA1C 5.8 (H) 10/26/2018     Your HGBA1c can be lowered with medications, healthy diet, and exercise.  Check your blood sugar as directed by your physician  Call your physician if you experience unexplained or low blood sugars.  PHYSICAL ACTIVITY/REHABILITATION Goal is 30 minutes at least 4 days per week  Activity: No driving, Therapies: see above Return to work: N/A  Activity decreases your risk of heart attack and stroke and makes your heart stronger.  It helps control your weight and blood pressure; helps you relax and can improve your mood.  Participate in a regular exercise program.  Talk with your doctor about the best form of exercise for you (dancing, walking, swimming, cycling).  DIET/WEIGHT Goal is to maintain a healthy weight  Your discharge  diet is:  Diet Order            DIET DYS 3 Room service appropriate? Yes; Fluid consistency: Thin  Diet effective now            liquids Your height is:  Height: 5\' 2"  (157.5 cm) Your current weight is: Weight: 68 kg Your Body Mass Index (BMI) is:  BMI (Calculated): 27.41  Following the type of diet specifically designed for you will help prevent another stroke.  Your goal weight is: 136 lbs  Your goal Body Mass Index (BMI) is 19-24.  Healthy food habits can help reduce 3 risk factors for stroke:  High cholesterol, hypertension, and excess weight.  RESOURCES Stroke/Support Group:  Call [phone]   STROKE EDUCATION PROVIDED/REVIEWED AND GIVEN TO PATIENT Stroke warning signs and symptoms How to activate emergency medical system (call 911). Medications prescribed at discharge. Need for follow-up after discharge. Personal risk factors for stroke. Pneumonia vaccine given:  Flu vaccine given:  My questions have been answered, the writing is legible, and I understand these instructions.  I will adhere to these goals & educational materials that have been provided to me after my discharge from the hospital.     My questions have been answered and I understand these instructions. I will adhere to these goals and the provided educational materials after my discharge from the hospital.  Patient/Caregiver Signature _______________________________ Date __________  Clinician Signature _______________________________________ Date __________  Please bring this form and your medication list with you to all your follow-up doctor's appointments.

## 2018-11-11 NOTE — Progress Notes (Signed)
Patient ID: Morgan Boyer, female   DOB: 1931-07-08, 85 y.o.   MRN: [id number] Talked to Dr. Irish Lack about patient having another echo on 11/11/18. He said to cancel because patient just had a TEE and echo end of July.

## 2018-11-11 NOTE — Progress Notes (Signed)
[facility] PHYSICAL MEDICINE & REHABILITATION PROGRESS NOTE  Subjective/Complaints: In good spirits.   ROS: Patient denies fever, rash, sore throat, blurred vision, nausea, vomiting, diarrhea, cough, shortness of breath or chest pain, joint or back pain, headache, or mood change.     Objective: Vital Signs: Blood pressure 118/76, pulse 63, temperature 98 F (36.7 C), temperature source Oral, resp. rate 17, height 5\' 2"  (1.575 m), weight 68 kg, SpO2 99 %. No results found. No results for input(s): WBC, HGB, HCT, PLT in the last 72 hours. No results for input(s): NA, K, CL, CO2, GLUCOSE, BUN, CREATININE, CALCIUM in the last 72 hours.  Physical Exam: BP 118/76   Pulse 63   Temp 98 F (36.7 C) (Oral)   Resp 17   Ht 5\' 2"  (1.575 m)   Wt 68 kg   SpO2 99%   BMI 27.42 kg/m  Constitutional: No distress . Vital signs reviewed. HEENT: EOMI, oral membranes moist Neck: supple Cardiovascular: RRR without murmur. No JVD    Respiratory: CTA Bilaterally without wheezes or rales. Normal effort    GI: BS +, non-tender, non-distended   Musc: No edema or tenderness in extremities. Neurological: Alert HOH, mild STM deficits. Improved processing speed Motor:  RUE/RLE: Grossly 4+/5, unchanged LUE:  4- to4/5 with improved FMC. LLE 4- to 4/5. Skin:   skin tear clean left wrist. stable  Psychiatric:pleasant   Assessment/Plan: 1. 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  Physiatrist is providing close team supervision and 24 hour management of active medical problems listed below.  Physiatrist and rehab team continue to assess barriers to discharge/monitor patient progress toward functional and medical goals  Care Tool:  Bathing    Body parts bathed by patient: Left arm, Chest, Front perineal area, Buttocks, Right upper leg, Left upper leg, Face, Abdomen, Right lower leg,  Left lower leg, Right arm   Body parts bathed by helper: Right arm, Right lower leg, Left lower leg     Bathing assist Assist Level: Independent with assistive device     Upper Body Dressing/Undressing Upper body dressing   What is the patient wearing?: Bra, Pull over shirt    Upper body assist Assist Level: Independent    Lower Body Dressing/Undressing Lower body dressing      What is the patient wearing?: Underwear/pull up, Pants     Lower body assist Assist for lower body dressing: Supervision/Verbal cueing     Toileting Toileting    Toileting assist Assist for toileting: Independent with assistive device     Transfers Chair/bed transfer  Transfers assist     Chair/bed transfer assist level: Supervision/Verbal cueing     Locomotion Ambulation   Ambulation assist      Assist level: Supervision/Verbal cueing Assistive device: No Device Max distance: 150 ft   Walk 10 feet activity   Assist     Assist level: Supervision/Verbal cueing Assistive device: No Device   Walk 50 feet activity   Assist    Assist level: Supervision/Verbal cueing Assistive device: No Device    Walk 150 feet activity   Assist Walk 150 feet activity did not occur: Safety/medical concerns  Assist level: Supervision/Verbal cueing Assistive device: No Device    Walk 10 feet on uneven surface  activity   Assist     Assist level: Supervision/Verbal cueing Assistive device: (none)   Wheelchair     Assist Will patient use wheelchair  at discharge?: No   Wheelchair activity did not occur: N/A(pt to d/c at ambulatory level)         Wheelchair 50 feet with 2 turns activity    Assist    Wheelchair 50 feet with 2 turns activity did not occur: N/A       Wheelchair 150 feet activity     Assist Wheelchair 150 feet activity did not occur: N/A          Medical Problem List and Plan: 1.Functional deficits, left hemiplegia,  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 TEE revealed severe MR but no LA or LAA thrombus. Loop recorder placed to monitor for rhythmic/cardiac source of stroke.  -dc today  -Patient to see Rehab MD/provider in the office for transitional care encounter in 1-2 weeks.  2. Antithrombotics: -DVT/anticoagulation:Pharmaceutical:Lovenox -antiplatelet therapy: DAPT                         -ASA 81 mg daily and plavix daily for 3 weeks then plavix alone 3. Pain Management:Tylenol prn 4. Mood:LCSW to follow for evaluation and support. -antipsychotic agents: NA 5. Neuropsych: This patientis?fully capable of making decisions on herown behalf. 6. Skin/Wound Care:Routine pressure relief measures.  -continue dressing to skin tear left wrist 7. Fluids/Electrolytes/Nutrition:Monitor I/O.  -intake reasonable   8. BMS:XJDBZMC BP bid--continue amlodipine and lisinopril  Controlled on 8/11  Monitor with increased mobility 9. GERD: Continue PPI. 10. Neuropathy: Managed with use of gabapentin and baclofen at nights.  Stable on 8/2 11. Hypokalemia:   Potassium  3.9    Supplemented x2 days.  -labs pending today 12.  Hypoalbuminemia  Supplement initiated on 8/1 13.  Acute blood loss anemia  Hemoglobin holding at 11.3 on 8/5  Continue to monitor   LOS: 11 days A FACE TO [place] 11/11/2018, 10:00 AM

## 2018-11-11 NOTE — Discharge Summary (Signed)
Physician Discharge Summary  Patient ID: Morgan Boyer MRN: [id number] DOB/AGE: [birth year] 83 y.o.  Admit date: 10/31/2018 Discharge date: 11/11/2018  Discharge Diagnoses:  Principal Problem:   Embolic stroke [facility]) Active Problems:   Essential hypertension   Acute blood loss anemia   Hypoalbuminemia due to protein-calorie malnutrition (HCC)   Hemiparesis affecting left side as late effect of stroke ([place])   Neuropathy   Discharged Condition:  Stable   Significant Diagnostic Studies: N/A    Labs:  Basic Metabolic Panel: BMP Latest Ref Rng & Units 11/05/2018 11/01/2018 10/31/2018  Glucose 70 - 99 mg/dL 98 93 101(H)  BUN 8 - 23 mg/dL 21 17 13   Creatinine 0.44 - 1.00 mg/dL 0.75 0.63 0.64  Sodium 135 - 145 mmol/L 141 140 139  Potassium 3.5 - 5.1 mmol/L 3.9 3.8 3.3(L)  Chloride 98 - 111 mmol/L 106 106 105  CO2 22 - 32 mmol/L 27 24 26   Calcium 8.9 - 10.3 mg/dL 8.9 8.7(L) 8.5(L)    CBC: CBC Latest Ref Rng & Units 11/05/2018 11/01/2018 10/31/2018  WBC 4.0 - 10.5 K/uL 5.9 7.8 8.6  Hemoglobin 12.0 - 15.0 g/dL 11.3(L) 11.9(L) 11.8(L)  Hematocrit 36.0 - 46.0 % 34.1(L) 36.0 35.1(L)  Platelets 150 - 400 K/uL 234 216 206    CBG: No results for input(s): GLUCAP in the last 168 hours.  Brief HPI:   Morgan Boyer is an 85 year old female with history of HTN, GERD, neuropathy, HA, depression who was admitted on 10/26/18 with sudden onset of left-sided weakness.  CTA head/neck was negative for LVO and she received TPA.  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  Dr. Tasia Catchings was concerned about occult A. fib and patient underwent TEE with placement of loop recorder.  TEE revealed normal EF of 60 to 65% and was negative for PFO or LAA able masses.  Partially flail posterior leaflet of mitral valve with severe mitral regurg  noted.  Therapy initiated and patient with resultant gait disorder with left inattention as well as deficits in high-level cognition.  CIR was recommended due to functional decline   Hospital Course: Morgan Boyer was admitted to rehab 10/31/2018 for inpatient therapies to consist of PT, ST and OT at least three hours five days a week. Past admission physiatrist, therapy team and rehab RN have worked together to provide customized collaborative inpatient rehab.  He was maintained on aspirin and Plavix during his stay.  She is tolerating this without any side effects.  Serial CBC shows H&H and platelets are relatively stable.  Loop recorder placement site is C/V/with Steri-Strips in place.  Blood pressures are monitored on twice daily basis and are controlled on amlodipine and lisinopril.  P.o. intake has been good.  Mild hypokalemia at admission was supplemented with K-Dur x2 days.  Follow-up labs showed that hypokalemia had resolved.  Protein supplements were added for hypoalbuminemia.  She has made steady progress during her rehab stay and is currently at modified independent to supervision level.  She will continue to receive further follow-up outpatient PT and OT at [facility] neuro rehab after discharge   Rehab course: During patient's stay in rehab weekly team conferences were held to monitor patient's progress, set goals and discuss barriers to discharge. At admission, patient required max assist for basic ADL task and min assist with mobility.  She  exhibited mild cognitive deficits and speech was 100% intelligible without signs of dysarthria or apraxia. She  has had improvement in activity tolerance, balance, postural control as well as ability to compensate for deficits. She has had improvement in functional use RUE  and RLE as well as improvement in awareness. She is able to complete ADL tasks at modified independent to supervision level. She requires supervision with verbal cues for use of memories  compensatory strategies recall and new and daily information. She is able to complete functional problem-solving for complex and familiar tasks at modified independent level.  Family education was completed regarding all aspects of safety and cognitive assistance needed after discharge.    Disposition: Home  Diet: Heart healthy  Special Instructions: 1.  No driving or strenuous activity till cleared by MD. 2.  Stop ASA after 11/21/18.     Discharge Instructions    Ambulatory referral to Physical Medicine Rehab   Complete by: As directed    1-2 weeks transitional care appt     Allergies as of 11/11/2018      Reactions   Septra [sulfamethoxazole-trimethoprim] Hives   Lotensin [benazepril Hcl] Other (See Comments)   NOT KNOWN      Medication List    STOP taking these medications   esomeprazole 20 MG capsule Commonly known as: NEXIUM   furosemide 20 MG tablet Commonly known as: LASIX   ibuprofen 200 MG tablet Commonly known as: ADVIL   lisinopril-hydrochlorothiazide 20-12.5 MG tablet Commonly known as: ZESTORETIC   Valerian Root 500 MG Caps     TAKE these medications   acetaminophen 325 MG tablet Commonly known as: TYLENOL Take 1-2 tablets (325-650 mg total) by mouth every 4 (four) hours as needed for mild pain.   amLODipine 10 MG tablet Commonly known as: NORVASC Take 1 tablet (10 mg total) by mouth daily.   aspirin 81 MG tablet Take 1 tablet (81 mg total) by mouth daily. Stop taking this after 8/21. What changed: additional instructions Notes to patient: Stop after 11/21/18    b complex vitamins tablet Take 1 tablet by mouth daily.   baclofen 10 MG tablet Commonly known as: LIORESAL Take 1 tablet (10 mg total) by mouth at bedtime.   CITRACAL PO Take 1 tablet by mouth 2 (two) times daily. + D3   clopidogrel 75 MG tablet Commonly known as: PLAVIX Take 1 tablet (75 mg total) by mouth daily.   diclofenac sodium 1 % Gel Commonly known as: VOLTAREN Apply  1 application topically 3 (three) times daily as needed (pain). Notes to patient: OTC   gabapentin 600 MG tablet Commonly known as: NEURONTIN Take 1 tablet (600 mg total) by mouth at bedtime.   hydrocortisone 2.5 % cream Apply 1 application topically daily as needed (rash).   lisinopril 20 MG tablet Commonly known as: ZESTRIL Take 1 tablet (20 mg total) by mouth 2 (two) times daily.   Muscle Rub 10-15 % Crea Apply 1 application topically as needed for muscle pain. To bilateral knees   pantoprazole 40 MG tablet Commonly known as: PROTONIX Take 1 tablet (40 mg total) by mouth daily. Notes to patient: Takes place of Nexium   simvastatin 20 MG tablet Commonly known as: ZOCOR Take 20 mg by mouth every evening.   SYSTANE OP Place 1 drop into both eyes 3 (three) times daily as needed (for dry eyes).      Follow-up Information    Leighton Ruff, MD. Call.   Specialty: Family Medicine Why: to  have a hospital follow up visit within two weeks of discharge. Contact information: [place] [address] [phone]        Burnell Blanks, MD .   Specialty: Cardiology Contact information: [place]. [address] [place] [address] [phone]        Meredith Staggers, MD Follow up.   Specialty: Physical Medicine and Rehabilitation Why: office will call you with follow up appointment Contact information: [address] [place] [address] [phone]           Signed: Bary Leriche 11/12/2018, 6:36 PM

## 2018-11-12 ENCOUNTER — Telehealth: Payer: Self-pay

## 2018-11-12 NOTE — Telephone Encounter (Signed)
    COVID-19 Pre-Screening Questions:  . In the past 7 to 10 days have you had a cough,  shortness of breath, headache, congestion, fever (100 or greater) body aches, chills, sore throat, or sudden loss of taste or sense of smell? . Have you been around anyone with known Covid 19. . Have you been around anyone who is awaiting Covid 19 test results in the past 7 to 10 days? . Have you been around anyone who has been exposed to Covid 19, or has mentioned symptoms of Covid 19 within the past 7 to 10 days?  If you have any concerns/questions about symptoms patients report during screening (either on the phone or at [facility]). Contact the provider seeing the patient or DOD for further guidance.  If neither are available contact a member of the leadership team.      I told the pt I will call her tomorrow after 11 am. The states she was too tired to talk on the phone.

## 2018-11-13 ENCOUNTER — Ambulatory Visit: Payer: PPO

## 2018-11-13 ENCOUNTER — Telehealth: Payer: Self-pay

## 2018-11-13 NOTE — Telephone Encounter (Signed)
LMOVM

## 2018-11-13 NOTE — Telephone Encounter (Signed)
1st attempt at [place], no answer, left message to call back  Patient Name: Morgan Boyer DOB: 10-31-2016 Appointment Date and Time: 12-01-2018 / 3:00pm With: Dr. Letta Pate  Questions for our staff to ask patients on Transitional care 48 hour phone call:   1. Are you/is patient experiencing any problems since coming home? Are there any questions regarding any aspect of care?   2. Are there any questions regarding medications administration/dosing? Are meds being taken as prescribed? Patient should review meds with caller to confirm   3. Have there been any falls?   4. Has Home Health been to the house and/or have they contacted you? If not, have you tried to contact them? Can we help you contact them?   5. Are bowels and bladder emptying properly? Are there any unexpected incontinence issues? If applicable, is patient following bowel/bladder programs?   6. Any fevers, problems with breathing, unexpected pain?   7. Are there any skin problems or new areas of breakdown?   8. Has the patient/family member arranged specialty MD follow up (ie cardiology/neurology/renal/surgical/etc)? Can we help arrange?   9. Does the patient need any other services or support that we can help arrange?   10. Are caregivers following through as expected in assisting the patient?   11. Has the patient quit smoking, drinking alcohol, or using drugs as recommended?   [place] [phone]

## 2018-11-17 ENCOUNTER — Ambulatory Visit: Payer: PPO | Attending: Physical Medicine and Rehabilitation | Admitting: Physical Therapy

## 2018-11-17 ENCOUNTER — Ambulatory Visit: Payer: PPO | Admitting: Occupational Therapy

## 2018-11-17 ENCOUNTER — Encounter: Payer: Self-pay | Admitting: Occupational Therapy

## 2018-11-17 ENCOUNTER — Other Ambulatory Visit: Payer: Self-pay

## 2018-11-17 ENCOUNTER — Encounter: Payer: Self-pay | Admitting: Physical Therapy

## 2018-11-17 DIAGNOSIS — R278 Other lack of coordination: Secondary | ICD-10-CM | POA: Diagnosis not present

## 2018-11-17 DIAGNOSIS — I69354 Hemiplegia and hemiparesis following cerebral infarction affecting left non-dominant side: Secondary | ICD-10-CM | POA: Diagnosis not present

## 2018-11-17 DIAGNOSIS — R2681 Unsteadiness on feet: Secondary | ICD-10-CM

## 2018-11-17 DIAGNOSIS — R2689 Other abnormalities of gait and mobility: Secondary | ICD-10-CM | POA: Diagnosis not present

## 2018-11-17 DIAGNOSIS — M6281 Muscle weakness (generalized): Secondary | ICD-10-CM

## 2018-11-17 NOTE — Therapy (Signed)
[facility] [address] [place], [place], [address] Phone: [phone]   Fax:  [phone]  Occupational Therapy Evaluation  Patient Details  Name: Morgan Boyer MRN: [id number] Date of Birth: 06/18/1931 Referring Provider (OT): Dr. Letta Pate   Encounter Date: 11/17/2018  OT End of Session - 11/17/18 1015    Visit Number  1    Number of Visits  5    Date for OT Re-Evaluation  12/15/18    Authorization Type  [employer] - follow medicare guidelines for VL    Authorization Time Period  90 days    OT Start Time  0847    OT Stop Time  0925    OT Time Calculation (min)  38 min    Activity Tolerance  Patient tolerated treatment well       Past Medical History:  Diagnosis Date  . Abnormality of gait   . Adenomatous colon polyp   . Arthritis   . Back pain   . Cancer ([place])    skin cancers  . Depression   . Diverticulosis   . DJD (degenerative joint disease) of knee   . Dyslipidemia   . Esophageal reflux   . H/O hiatal hernia   . Heart murmur   . Herpes zoster    throat  . History of shingles    left throat 07/2007  . Hyperlipidemia   . Hypertension   . Memory changes 01/14/2013  . Neuromuscular disorder ([place])    peripheral neuropathy  . Nocturnal leg cramps 07/15/2014  . Peripheral neuropathy   . Polyneuropathy in other diseases classified elsewhere ([place]) 01/14/2013    Past Surgical History:  Procedure Laterality Date  . ABDOMINAL HYSTERECTOMY    . APPENDECTOMY    . BACK SURGERY  07/2017  . BUBBLE STUDY  10/31/2018   Procedure: BUBBLE STUDY;  Surgeon: Fay Records, MD;  Location: [facility] ENDOSCOPY;  Service: Cardiovascular;;  . COLON SURGERY     2001 diverticulitis with infection ..drained surgically .  Marland Kitchen EYE SURGERY     cat ext bil   . JOINT REPLACEMENT     right  . KNEE ARTHROSCOPY  11/08/2011  . LOOP RECORDER INSERTION N/A 10/31/2018   Procedure: LOOP RECORDER INSERTION;  Surgeon: Constance Haw, MD;  Location: [facility] CV LAB;  Service: Cardiovascular;  Laterality: N/A;  . TEE WITHOUT CARDIOVERSION N/A 10/31/2018   Procedure: TRANSESOPHAGEAL ECHOCARDIOGRAM (TEE);  Surgeon: Fay Records, MD;  Location: [facility] ENDOSCOPY;  Service: Cardiovascular;  Laterality: N/A;  . TONSILLECTOMY    . TOTAL KNEE ARTHROPLASTY  11/07/2011   Procedure: TOTAL KNEE ARTHROPLASTY;  Surgeon: Ninetta Lights, MD;  Location: [facility];  Service: Orthopedics;  Laterality: Left;  . TUBAL LIGATION    . WRIST SURGERY     for skin cancer    There were no vitals filed for this visit.  Subjective Assessment - 11/17/18 0853    Subjective   I don't have any pain and I am grateful for that    Patient is accompanied by:  Family member   son Micheal   Pertinent History  R frontal/parietal CVA, L occipital CVA on 10/26/2018. PMH:    Patient Stated Goals  To be able to walk better and have better balance    Currently in Pain?  No/denies        [facility] OT Assessment - 11/17/18 0855      Assessment   Medical Diagnosis  R frontal/parietal  and L occipital infarct    Referring Provider (OT)  Dr. Letta Pate    Onset Date/Surgical Date  10/26/18    Hand Dominance  Right    Prior Therapy  inpt rehab PT, OT and ST      Precautions   Precautions  Fall      Restrictions   Weight Bearing Restrictions  No      Balance Screen   Has the patient fallen in the past 6 months  No   pt had PT eval today     Home  Environment   Family/patient expects to be discharged to:  Private residence    Living Arrangements  Alone    Available Help at Discharge  Available PRN/intermittently    Type of Home  Other (Comment)   townhouse   Home Layout  One level    Bathroom Insurance account manager    Additional Comments  Pt has grab bars in shower and shower seat, raised toilet seat and uses cabinet to steady as needed.      Prior Function   Level of Independence  Independent    Vocation   Retired    Probation officer work    Leisure  read and do puzzles      ADL   Eating/Feeding  Modified independent    Grooming  Independent    Upper Body Bathing  Modified independent    Lower Body Bathing  Modified independent    Social research officer, government  Modified independent    [place] Transfer  Modified independent    ADL comments  Family is present in the house when pt is showering but not in the bathroom.      IADL   Shopping  Completely unable to shop   adult children shopping for pt   Light Housekeeping  Does not participate in any housekeeping tasks   pt's adult children assist and pt has housecleaner   Meal Prep  Able to complete simple cold meal and snack prep    Community Mobility  Relies on family or friends for transportation    Medication Management  Takes responsibility if medication is prepared in advance in seperate dosage      Mobility   Mobility Status  Needs assist    Mobility Status Comments  supervison in the community      Written Expression   Dominant Hand  Right      Vision - History   Baseline Vision  Wears glasses only for reading    Additional Comments  Pt denies any visual changes. Pt and son both deny any L inattention (documented in acute care) - will contiue to monitor via functional activity.       Vision Assessment   Comment  Pt denies any blurry vision or vertigo      Activity Tolerance   Activity Tolerance  Tolerate 30+ min activity without fatigue   pt reports she can do ADL but then needs to rest     Cognition   Overall Cognitive Status  Within Functional Limits for tasks assessed    Cognition Comments  Pt reports she was having some mild memory prior to CVA but feels she is at baseline.  Inpt ST therapy note supports  this.       Sensation   Light Touch   Appears Intact    Hot/Cold  Appears Intact    Proprioception  Appears Intact      Coordination   Gross Motor Movements are Fluid and Coordinated  Yes   slightly slowed movement   Fine Motor Movements are Fluid and Coordinated  No    Finger Nose Finger Test  mild impairement    9 Hole Peg Test  Right;Left    Right 9 Hole Peg Test  20.89    Left 9 Hole Peg Test  38.72    Other  mild ataxia in LUE      Tone   Assessment Location  Left Upper Extremity      ROM / Strength   AROM / PROM / Strength  AROM;Strength      AROM   Overall AROM   Within functional limits for tasks performed      Strength   Overall Strength  Deficits    Overall Strength Comments  decreased grip strength, decreased wrist extension strength (3/5)      Hand Function   Right Hand Gross Grasp  Functional    Right Hand Grip (lbs)  35    Left Hand Gross Grasp  Impaired    Left Hand Grip (lbs)  16      LUE Tone   LUE Tone  Within Functional Limits                           OT Long Term Goals - 11/17/18 1007      OT LONG TERM GOAL #1   Title  Pt will be mod I for HEP for grip strength, wrist extension strength and coordination of RUE - 12/15/2018    Status  New      OT LONG TERM GOAL #2   Title  Pt will be mod with splint wear and care for RUE wrist support PRN    Status  New      OT LONG TERM GOAL #3   Title  Pt will be mod I with simple hot meal prep    Status  New            Plan - 11/17/18 1008    Clinical Impression Statement  Pt is an 85 year old female s/p R frontal/parietal and L occipital CVA on 10/26/2018. PMH: HTN, neuropathy, GERD, depression, mild memory per pt. Pt was discharged from the hospital on 11/11/2018 after inpt rehab stay where she recieved PT, OT and ST.    OT Occupational Profile and History  Detailed Assessment- Review of Records and additional review of physical, cognitive, psychosocial history related to current functional performance    Occupational  performance deficits (Please refer to evaluation for details):  IADL's;Leisure;Rest and Sleep;ADL's    Body Structure / Function / Physical Skills  ADL;Balance;FMC;IADL;GMC;UE functional use;Strength    Rehab Potential  Good    Clinical Decision Making  Limited treatment options, no task modification necessary    Comorbidities Affecting Occupational Performance:  May have comorbidities impacting occupational performance    Modification or Assistance to Complete Evaluation   Min-Moderate modification of tasks or assist with assess necessary to complete eval    OT Frequency  2x / week    OT Duration  2 weeks    OT Treatment/Interventions  Self-care/ADL training;DME and/or AE instruction;Neuromuscular education;Therapeutic exercise;Therapeutic activities;Splinting;Patient/family education    Plan  initiated  HEP for grip, coordination and wrist strength.  Monitor pain in L wrist to determine possible need for wrist splint for support    Consulted and Agree with Plan of Care  Patient;Family member/caregiver    Family Member Consulted  son [name]       Patient will benefit from skilled therapeutic intervention in order to improve the following deficits and impairments:   Body Structure / Function / Physical Skills: ADL, Balance, FMC, IADL, GMC, UE functional use, Strength       Visit Diagnosis: 1. Hemiplegia and hemiparesis following cerebral infarction affecting left non-dominant side ([place])   2. Other lack of coordination       Problem List Patient Active Problem List   Diagnosis Date Noted  . Hemiparesis affecting left side as late effect of stroke ([place])   . Neuropathy   . Acute blood loss anemia   . Hypoalbuminemia due to protein-calorie malnutrition ([place])   . Embolic stroke ([place]) 53/20/2334  . Esophageal stricture 10/30/2018  . Family hx-stroke 10/27/2018  . Advanced age 29/27/2020  . Stroke (cerebrum) (HCC)-R MCA & L MCA infarcts, embolic, source unknown 35/68/6168  .  Thrombocytopenia ([place])   . Essential hypertension   . Hyperlipidemia   . Hypokalemia   . Lumbar stenosis with neurogenic claudication 07/03/2017  . Near syncope 11/10/2014  . Nocturnal leg cramps 07/15/2014  . Abnormality of gait 07/15/2014  . Polyneuropathy in other diseases classified elsewhere ([place]) 01/14/2013  . Memory changes 01/14/2013    Quay Burow, OTR/L 11/17/2018, 11:59 AM  [facility] [address] [place], [place], [address] Phone: [phone]   Fax:  [phone]  Name: Morgan Boyer MRN: [id number] Date of Birth: 1931/07/25

## 2018-11-17 NOTE — Therapy (Signed)
[facility] [address] [place], [place], [address] Phone: [phone]   Fax:  [phone]  Physical Therapy Evaluation  Patient Details  Name: Morgan Boyer MRN: [id number] Date of Birth: Feb 09, 1932 Referring Provider (PT): Letta Pate Luanna Salk, MD   Encounter Date: 11/17/2018  PT End of Session - 11/17/18 1158    Visit Number  1    Number of Visits  17    Date for PT Re-Evaluation  01/16/19    Authorization Type  [employer], VL: Follow Medicare guidelines    PT Start Time  0802    PT Stop Time  0973    PT Time Calculation (min)  45 min    Equipment Utilized During Treatment  Gait belt    Activity Tolerance  Patient tolerated treatment well    Behavior During Therapy  [facility] for tasks assessed/performed       Past Medical History:  Diagnosis Date  . Abnormality of gait   . Adenomatous colon polyp   . Arthritis   . Back pain   . Cancer ([place])    skin cancers  . Depression   . Diverticulosis   . DJD (degenerative joint disease) of knee   . Dyslipidemia   . Esophageal reflux   . H/O hiatal hernia   . Heart murmur   . Herpes zoster    throat  . History of shingles    left throat 07/2007  . Hyperlipidemia   . Hypertension   . Memory changes 01/14/2013  . Neuromuscular disorder ([place])    peripheral neuropathy  . Nocturnal leg cramps 07/15/2014  . Peripheral neuropathy   . Polyneuropathy in other diseases classified elsewhere ([place]) 01/14/2013    Past Surgical History:  Procedure Laterality Date  . ABDOMINAL HYSTERECTOMY    . APPENDECTOMY    . BACK SURGERY  07/2017  . BUBBLE STUDY  10/31/2018   Procedure: BUBBLE STUDY;  Surgeon: Fay Records, MD;  Location: [facility] ENDOSCOPY;  Service: Cardiovascular;;  . COLON SURGERY     2001 diverticulitis with infection ..drained surgically .  Marland Kitchen EYE SURGERY     cat ext bil   . JOINT REPLACEMENT     right  . KNEE ARTHROSCOPY  11/08/2011  . LOOP RECORDER INSERTION N/A  10/31/2018   Procedure: LOOP RECORDER INSERTION;  Surgeon: Constance Haw, MD;  Location: [facility] CV LAB;  Service: Cardiovascular;  Laterality: N/A;  . TEE WITHOUT CARDIOVERSION N/A 10/31/2018   Procedure: TRANSESOPHAGEAL ECHOCARDIOGRAM (TEE);  Surgeon: Fay Records, MD;  Location: [facility] ENDOSCOPY;  Service: Cardiovascular;  Laterality: N/A;  . TONSILLECTOMY    . TOTAL KNEE ARTHROPLASTY  11/07/2011   Procedure: TOTAL KNEE ARTHROPLASTY;  Surgeon: Ninetta Lights, MD;  Location: [facility];  Service: Orthopedics;  Laterality: Left;  . TUBAL LIGATION    . WRIST SURGERY     for skin cancer    There were no vitals filed for this visit.   Subjective Assessment - 11/17/18 0805    Subjective  Doing good since leaving the hospital. Balance is not the same as it has been but she feels like it has been improving.    Patient is accompained by:  Family member   son, Morgan Boyer   Pertinent History  R frontal/parietal CVA, L occipital CVA on 10/26/2018, HTN, peripheral neuropathy, HLD, B knee replacements, back surgery 2019    Patient Stated Goals  wants to be able to walk better and wants to feel  more stable    Currently in Pain?  No/denies         [facility] PT Assessment - 11/17/18 8527      Assessment   Medical Diagnosis  CVA    Referring Provider (PT)  Letta Pate, Luanna Salk, MD    Onset Date/Surgical Date  10/26/18    Hand Dominance  Right    Prior Therapy  CIR during hospital stay: PT, OT, ST      Precautions   Precautions  Fall      Balance Screen   Has the patient fallen in the past 6 months  No    Has the patient had a decrease in activity level because of a fear of falling?   No    Is the patient reluctant to leave their home because of a fear of falling?   No      Home Environment   Living Environment  Private residence    Living Arrangements  Alone    Available Help at Discharge  --   pt's children that live nearby    Type of [place] to enter     Entrance Stairs-Number of Steps  2    [place]  One level    [place]  Other (comment)   3 rolled walker, raised toilet seat    Additional Comments  Handrailing in house      Prior Function   Level of Independence  Independent;Other (comment)   had a cleaner prior to CVA for deeper household cleaning   Vocation  Retired    Probation officer work     Leisure  reading, likes puzzles      Civil Service fast streamer   Gross Motor Movements are Fluid and Coordinated  No    Coordination and Movement Description  impaired due to strength deficits on LLE      Posture/Postural Control   Posture/Postural Control  Postural limitations    Postural Limitations  Rounded Shoulders;Forward head;Posterior pelvic tilt      ROM / Strength   AROM / PROM / Strength  Strength      Strength   Right Hip Flexion  4/5    Left Hip Flexion  3+/5    Right Knee Flexion  4/5    Right Knee Extension  4+/5    Left Knee Flexion  4/5    Left Knee Extension  4/5    Right Ankle Dorsiflexion  4+/5    Left Ankle Dorsiflexion  4/5      Ambulation/Gait   Ambulation/Gait  Yes    Ambulation/Gait Assistance  4: Min guard    Ambulation Distance (Feet)  150 Feet    Assistive device  None    Gait Pattern  Step-through pattern;Decreased arm swing - left;Decreased weight shift to left;Decreased step length - right;Decreased stance time - left;Decreased trunk rotation;Trunk flexed;Wide base of support   B genu valgum   Ambulation Surface  Level;Indoor    Gait velocity  15.06 seconds = 2.18 ft/sec      Standardized Balance Assessment   Standardized Balance Assessment  Timed Up and Go Test;Five Times Sit to Stand;Berg Balance Test    Five times sit to stand comments   27.56 seconds from low blue mat table, increased B knee valgum       [name]  Balance Test   Sit to Stand  Able to stand  independently using hands    Standing  Unsupported  Able to stand safely 2 minutes    Sitting with Back Unsupported but Feet Supported on Floor or Stool  Able to sit safely and securely 2 minutes    Stand to Sit  Controls descent by using hands    Transfers  Able to transfer safely, minor use of hands    Standing Unsupported with Eyes Closed  Able to stand 10 seconds safely    Standing Unsupported with Feet Together  Able to place feet together independently and stand 1 minute safely    From Standing, Reach Forward with Outstretched Arm  Can reach forward >12 cm safely (5")    From Standing Position, Pick up Object from Floor  Able to pick up shoe, needs supervision    From Standing Position, Turn to Look Behind Over each Shoulder  Looks behind from both sides and weight shifts well    Turn 360 Degrees  Able to turn 360 degrees safely but slowly   6.27 seconds   Standing Unsupported, Alternately Place Feet on Step/Stool  Needs assistance to keep from falling or unable to try    Standing Unsupported, One Foot in [employer] balance while stepping or standing    Standing on One Leg  Unable to try or needs assist to prevent fall    Total Score  38    Berg comment:  38/56       Timed Up and Go Test   Normal TUG (seconds)  24.97                Objective measurements completed on examination: See above findings.              PT Education - 11/17/18 1157    Education Details  clinical findings, fall risk, POC    Person(s) Educated  Patient;Child(ren)   Morgan Boyer   Methods  Explanation    Comprehension  Verbalized understanding;Need further instruction       PT Short Term Goals - 11/17/18 1207      PT SHORT TERM GOAL #1   Title  Patient will be independent with initial HEP in order to build upon functional gains in therapy. ALL STGs DUE 12/17/18    Time  4    Period  Weeks    Status  New    Target Date  12/17/18      PT SHORT TERM GOAL #2   Title  Patient will decrease 5 times sit <> stand score from  low blue mat table to at least 23 seconds in order to improve functional LE strength.    Baseline  27.56 seconds    Time  4    Period  Weeks    Status  New      PT SHORT TERM GOAL #3   Title  Patient will increase BERG score to at least a 42/56 in order to decrease risk of falls.    Baseline  38/56    Time  4    Period  Weeks    Status  New      PT SHORT TERM GOAL #4   Title  Patient will decrease TUG score using LRAD to at least 19 seconds in order to decrease risk of falls.    Baseline  24.97 secondson 11/17/18    Time  4    Period  Weeks  Status  New        PT Long Term Goals - 11/17/18 1210      PT LONG TERM GOAL #1   Title  Patient will be independent with final HEP in order to build upon functional gains made in therapy. ALL LTGs DUE 01/16/19    Time  8    Period  Weeks    Status  New    Target Date  01/16/19      PT LONG TERM GOAL #2   Title  Patient will ascend/descend 2 steps using single handrail on L with supervision in order to safely enter/exit her home.    Baseline  Not yet assessed    Time  8    Period  Weeks    Status  New      PT LONG TERM GOAL #3   Title  Patient will increase BERG score to at least a 46/56 in order to decrease risk of falls.    Baseline  38 on 11/17/18    Time  8    Period  Weeks    Status  New      PT LONG TERM GOAL #4   Title  Patient will decrease TUG score using LRAD to at least 16 seconds in order to decrease risk of falls.    Baseline  24.97 seconds on 11/17/18    Time  8    Period  Weeks    Status  New      PT LONG TERM GOAL #5   Title  Patient will decrease 5 times sit <> stand score from low blue mat table to at least 19 seconds in order to improve functional LE strength.    Time  8    Period  Weeks    Status  New      Additional Long Term Goals   Additional Long Term Goals  Yes      PT LONG TERM GOAL #6   Title  Patient will ambulate at least 250' over level surfaces while scanning environment with mod I in  order to improve household and community mobility.    Time  8    Period  Weeks    Status  New             Plan - 11/17/18 1203    Clinical Impression Statement  Patient is a 85 year old female referred to Neuro OPPT for evaluation s/p R frontal/parietal CVA, L occipital CVA on 10/26/2018. Pt's PMH is significant for: HTN, peripheral neuropathy, HLD, B knee replacements, back surgery 2019  The following deficits were present during the exam: impaired balance, gait abnormalities, decreased LE strength, abnormal posture.  Pt's TUG and BERG scores indicate pt is at a high risk for falls.  Pt's gait speed demonstrates that she is a limited community ambulator. Pt would benefit from skilled PT to address these impairments and functional limitations to maximize functional mobility independence.    Personal Factors and Comorbidities  Age;Comorbidity 3+    Comorbidities  HTN, peripheral neuropathy, HLD    Examination-Activity Limitations  Locomotion Level;Transfers;Stairs    Stability/Clinical Decision Making  Stable/Uncomplicated    Clinical Decision Making  Low    Rehab Potential  Good    PT Frequency  2x / week    PT Duration  8 weeks    PT Treatment/Interventions  ADLs/Self Care Home Management;Functional mobility training;Stair training;Gait training;DME Instruction;Therapeutic activities;Therapeutic exercise;Balance training;Neuromuscular re-education;Patient/family education    PT Next Visit  Plan  assess stairs. Initial HEP for LE strengthening and balance (pt already has OTAGO A from hospital, increase difficulty/codense if needed). Discuss fall prevention strategies at home with handout.    Consulted and Agree with Plan of Care  Patient;Family member/caregiver    Family Member Consulted  son, Morgan Boyer       Patient will benefit from skilled therapeutic intervention in order to improve the following deficits and impairments:  Abnormal gait, Decreased balance, Decreased coordination,  Decreased strength, Difficulty walking, Postural dysfunction  Visit Diagnosis: 1. Other abnormalities of gait and mobility   2. Hemiparesis affecting left side as late effect of stroke ([place])   3. Unsteadiness on feet   4. Muscle weakness (generalized)        Problem List Patient Active Problem List   Diagnosis Date Noted  . Hemiparesis affecting left side as late effect of stroke ([place])   . Neuropathy   . Acute blood loss anemia   . Hypoalbuminemia due to protein-calorie malnutrition ([place])   . Embolic stroke ([place]) 66/09/3014  . Esophageal stricture 10/30/2018  . Family hx-stroke 10/27/2018  . Advanced age 91/27/2020  . Stroke (cerebrum) (HCC)-R MCA & L MCA infarcts, embolic, source unknown 04/10/3233  . Thrombocytopenia ([place])   . Essential hypertension   . Hyperlipidemia   . Hypokalemia   . Lumbar stenosis with neurogenic claudication 07/03/2017  . Near syncope 11/10/2014  . Nocturnal leg cramps 07/15/2014  . Abnormality of gait 07/15/2014  . Polyneuropathy in other diseases classified elsewhere ([place]) 01/14/2013  . Memory changes 01/14/2013    Arliss Journey, PT, DPT 11/17/2018, 12:21 PM  [facility] [address] [place], [place], [address] Phone: [phone]   Fax:  [phone]  Name: KAMRYN MESSINEO MRN: [id number] Date of Birth: Mar 10, 1932

## 2018-11-17 NOTE — ED Notes (Signed)
50ccNS flush started en route to 4N. No scanner at available.

## 2018-11-18 ENCOUNTER — Ambulatory Visit: Payer: PPO

## 2018-11-21 ENCOUNTER — Encounter: Payer: Self-pay | Admitting: Physical Medicine & Rehabilitation

## 2018-11-21 ENCOUNTER — Encounter: Payer: PPO | Attending: Physical Medicine & Rehabilitation | Admitting: Physical Medicine & Rehabilitation

## 2018-11-21 ENCOUNTER — Other Ambulatory Visit: Payer: Self-pay

## 2018-11-21 VITALS — BP 121/75 | HR 70 | Temp 98.7°F | Ht 62.0 in | Wt 143.0 lb

## 2018-11-21 DIAGNOSIS — I69354 Hemiplegia and hemiparesis following cerebral infarction affecting left non-dominant side: Secondary | ICD-10-CM

## 2018-11-21 NOTE — Patient Instructions (Signed)
 .  Keep up with home exercise program  

## 2018-11-21 NOTE — Progress Notes (Addendum)
Subjective:    Patient ID: Morgan Boyer, female    DOB: [birth year], 85 y.o.   MRN: [id number]  HPI EMELIN ADOLPHE is an 85 year old female with history of HTN, GERD, neuropathy, HA, depression who was admitted on 10/26/18 with sudden onset of left-sided weakness.  CTA head/neck was negative for LVO and she received TPA.  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  Dr. Tasia Catchings was concerned about occult A. fib and patient underwent TEE with placement of loop recorder.  TEE revealed normal EF of 60 to 65% and was negative for PFO or LAA able masses.  Partially flail posterior leaflet of mitral valve with severe mitral regurg noted.  Therapy initiated and patient with resultant gait disorder with left inattention as well as deficits in high-level cognition.  CIR was recommended due to functional decline  CIR 7/31-8/02/2019  MOD I ADL  Sees PCP next Friday  Cardiology in Sept   Biggest problem is poor rest , also has fatigue, has some interrupted sleep Using melatonin 10mg  qhs   Pain Inventory Average Pain 0 Pain Right Now 0 My pain is na  In the last 24 hours, has pain interfered with the following? General activity 0 Relation with others 0 Enjoyment of life 0 What TIME of day is your pain at its worst? na Sleep (in general) Fair  Pain is worse with: na Pain improves with: na Relief from Meds: na  Mobility walk without assistance ability to climb steps?  yes  Function retired I need assistance with the following:  household duties and shopping  Neuro/Psych trouble walking  Prior Studies Any changes since last visit?  no  Physicians involved in your care Any changes since last visit?  no   Family History  Problem Relation Age of Onset  . Congestive Heart Failure Mother   . Stroke Father  47  . Heart attack Brother 16  . Diabetes Brother    Social History   Socioeconomic History  . Marital status: Widowed    Spouse name: Not on file  . Number of children: 3  . Years of education: hs  . Highest education level: Not on file  Occupational History  . Occupation: retired  Scientific laboratory technician  . Financial resource strain: Not hard at all  . Food insecurity    Worry: Never true    Inability: Never true  . Transportation needs    Medical: No    Non-medical: No  Tobacco Use  . Smoking status: Never Smoker  . Smokeless tobacco: Never Used  Substance and Sexual Activity  . Alcohol use: No  . Drug use: No  . Sexual activity: Never  Lifestyle  . Physical activity    Days per week: Patient refused    Minutes per session: Patient refused  . Stress: Not on file  Relationships  . Social Herbalist on phone: Not on file    Gets together: Twice a week    Attends religious service: Never    Active member of club or organization: No    Attends meetings of clubs or organizations: Never    Relationship status: Widowed  Other Topics Concern  . Not on file  Social History Narrative   Husband deceased from lung cancer 23-Jun-2013.   Patient is right handed.   Patient drinks  2 cups of caffeine daily.   Past Surgical History:  Procedure Laterality Date  . ABDOMINAL HYSTERECTOMY    . APPENDECTOMY    . BACK SURGERY  07/2017  . BUBBLE STUDY  10/31/2018   Procedure: BUBBLE STUDY;  Surgeon: Fay Records, MD;  Location: [facility] ENDOSCOPY;  Service: Cardiovascular;;  . COLON SURGERY     2001 diverticulitis with infection ..drained surgically .  Marland Kitchen EYE SURGERY     cat ext bil   . JOINT REPLACEMENT     right  . KNEE ARTHROSCOPY  11/08/2011  . LOOP RECORDER INSERTION N/A 10/31/2018   Procedure: LOOP RECORDER INSERTION;  Surgeon: Constance Haw, MD;  Location: [facility] CV LAB;  Service: Cardiovascular;  Laterality: N/A;  . TEE WITHOUT CARDIOVERSION N/A 10/31/2018    Procedure: TRANSESOPHAGEAL ECHOCARDIOGRAM (TEE);  Surgeon: Fay Records, MD;  Location: [facility] ENDOSCOPY;  Service: Cardiovascular;  Laterality: N/A;  . TONSILLECTOMY    . TOTAL KNEE ARTHROPLASTY  11/07/2011   Procedure: TOTAL KNEE ARTHROPLASTY;  Surgeon: Ninetta Lights, MD;  Location: [facility];  Service: Orthopedics;  Laterality: Left;  . TUBAL LIGATION    . WRIST SURGERY     for skin cancer   Past Medical History:  Diagnosis Date  . Abnormality of gait   . Adenomatous colon polyp   . Arthritis   . Back pain   . Cancer ([place])    skin cancers  . Depression   . Diverticulosis   . DJD (degenerative joint disease) of knee   . Dyslipidemia   . Esophageal reflux   . H/O hiatal hernia   . Heart murmur   . Herpes zoster    throat  . History of shingles    left throat 07/2007  . Hyperlipidemia   . Hypertension   . Memory changes 01/14/2013  . Neuromuscular disorder ([place])    peripheral neuropathy  . Nocturnal leg cramps 07/15/2014  . Peripheral neuropathy   . Polyneuropathy in other diseases classified elsewhere ([place]) 01/14/2013   BP 121/75   Pulse 70   Temp 98.7 F (37.1 C)   Ht 5\' 2"  (1.575 m)   Wt 143 lb (64.9 kg)   SpO2 97%   BMI 26.16 kg/m   Opioid Risk Score:   Fall Risk Score:  `1  Depression screen PHQ 2/9  No flowsheet data found.   Review of Systems  Constitutional: Negative.   HENT: Negative.   Eyes: Negative.   Respiratory: Negative.   Cardiovascular: Negative.   Gastrointestinal: Negative.   Endocrine: Negative.   Genitourinary: Negative.   Musculoskeletal: Positive for gait problem.  Skin: Negative.   Allergic/Immunologic: Negative.   Hematological: Negative.   Psychiatric/Behavioral: Negative.   All other systems reviewed and are negative.      Objective:   Physical Exam Vitals signs and nursing note reviewed.  Constitutional:      Appearance: Normal appearance.  Musculoskeletal:     Comments: Patient has limitation of shoulder abduction but  does not have any pain. No pain with cervical spine range of motion Negative straight leg raising  Neurological:     General: No focal deficit present.     Mental Status: She is alert and oriented to person, place, and time.     Cranial Nerves: No dysarthria or facial asymmetry.     Sensory: Sensation is intact.     Coordination: Coordination abnormal. Finger-Nose-Finger Test abnormal. Impaired rapid alternating movements.     Comments: Motor strength is  4- in the left deltoid 4 in the bicep tricep grip 4 in the hip flexor 4 in the ankle dorsiflexor 5 in the knee extensor on the left 5/5 in the right deltoid bicep tricep grip hip flexor knee extensor ankle dorsiflexor. Ambulates without assistive device no evidence of toe drag or knee instability.  She is able to toe walk but does not go quite as far on the left side is on the right she is also able to heel walk but once again unable to dorsiflex as far on the left as on the right.  Reduced finger to thumb opposition as well as rapid alternating pronation supination left upper extremity  Psychiatric:        Mood and Affect: Mood normal.        Behavior: Behavior normal.           Assessment & Plan:  #1.  Right frontal parietal infarcts causing left hemiparesis, mild.  She has a stroke related gait disorder with decreased balance. I do think she would benefit from outpatient PT OT. We discussed her fatigue.  This is likely stroke related.  I do think improving her overall fitness level will help with this.  We discussed that there is no proven medication for this problem.  We also discussed this could be a lingering issue even after a good recovery.  I will see the patient back in approximately 2 months which will be after she finishes outpatient PT OT she is to call if she develops any pain related to her therapy  The patient is to follow-up with Dr. Darlina Guys from cardiology.  Patient has had a loop recorder placed for suspected  embolic stroke  Follow-up with Dr. Leighton Ruff from family medicine

## 2018-11-25 ENCOUNTER — Ambulatory Visit: Payer: PPO | Admitting: Physical Therapy

## 2018-11-25 ENCOUNTER — Other Ambulatory Visit: Payer: Self-pay

## 2018-11-25 DIAGNOSIS — M6281 Muscle weakness (generalized): Secondary | ICD-10-CM

## 2018-11-25 DIAGNOSIS — R2689 Other abnormalities of gait and mobility: Secondary | ICD-10-CM | POA: Diagnosis not present

## 2018-11-25 DIAGNOSIS — R2681 Unsteadiness on feet: Secondary | ICD-10-CM

## 2018-11-25 NOTE — Therapy (Signed)
[facility] [address] [place], [place], [address] Phone: [phone]   Fax:  [phone]  Physical Therapy Treatment  Patient Details  Name: Morgan Boyer MRN: [id number] Date of Birth: Sep 04, 1931 Referring Provider (PT): Letta Pate Luanna Salk, MD   Encounter Date: 11/25/2018  PT End of Session - 11/25/18 1441    Visit Number  2    Number of Visits  17    Date for PT Re-Evaluation  01/16/19    Authorization Type  [employer], VL: Follow Medicare guidelines    PT Start Time  1108    PT Stop Time  1146    PT Time Calculation (min)  38 min    Equipment Utilized During Treatment  Gait belt    Activity Tolerance  Patient tolerated treatment well    Behavior During Therapy  WFL for tasks assessed/performed       Past Medical History:  Diagnosis Date  . Abnormality of gait   . Adenomatous colon polyp   . Arthritis   . Back pain   . Cancer ([place])    skin cancers  . Depression   . Diverticulosis   . DJD (degenerative joint disease) of knee   . Dyslipidemia   . Esophageal reflux   . H/O hiatal hernia   . Heart murmur   . Herpes zoster    throat  . History of shingles    left throat 07/2007  . Hyperlipidemia   . Hypertension   . Memory changes 01/14/2013  . Neuromuscular disorder ([place])    peripheral neuropathy  . Nocturnal leg cramps 07/15/2014  . Peripheral neuropathy   . Polyneuropathy in other diseases classified elsewhere ([place]) 01/14/2013    Past Surgical History:  Procedure Laterality Date  . ABDOMINAL HYSTERECTOMY    . APPENDECTOMY    . BACK SURGERY  07/2017  . BUBBLE STUDY  10/31/2018   Procedure: BUBBLE STUDY;  Surgeon: Fay Records, MD;  Location: [facility] ENDOSCOPY;  Service: Cardiovascular;;  . COLON SURGERY     2001 diverticulitis with infection ..drained surgically .  Marland Kitchen EYE SURGERY     cat ext bil   . JOINT REPLACEMENT     right  . KNEE ARTHROSCOPY  11/08/2011  . LOOP RECORDER INSERTION N/A  10/31/2018   Procedure: LOOP RECORDER INSERTION;  Surgeon: Constance Haw, MD;  Location: [facility];  Service: Cardiovascular;  Laterality: N/A;  . TEE WITHOUT CARDIOVERSION N/A 10/31/2018   Procedure: TRANSESOPHAGEAL ECHOCARDIOGRAM (TEE);  Surgeon: Fay Records, MD;  Location: [facility] ENDOSCOPY;  Service: Cardiovascular;  Laterality: N/A;  . TONSILLECTOMY    . TOTAL KNEE ARTHROPLASTY  11/07/2011   Procedure: TOTAL KNEE ARTHROPLASTY;  Surgeon: Ninetta Lights, MD;  Location: [facility];  Service: Orthopedics;  Laterality: Left;  . TUBAL LIGATION    . WRIST SURGERY     for skin cancer    There were no vitals filed for this visit.  Subjective Assessment - 11/25/18 1439    Subjective  Denies any falls or changes.  States she has a wooden rail in her hallway that her son has installed for her to use for exercises.    Patient is accompained by:  Family member   son, Morgan Boyer   Pertinent History  R frontal/parietal CVA, L occipital CVA on 10/26/2018, HTN, peripheral neuropathy, HLD, B knee replacements, back surgery 2019    Patient Stated Goals  wants to be able to walk better and wants  to feel more stable    Currently in Pain?  No/denies         [facility] Adult PT Treatment/Exercise - 11/25/18 0001      Transfers   Transfers  Sit to Stand;Stand to Sit    Sit to Stand  5: Supervision    Stand to Sit  5: Supervision    Number of Reps  10 reps;2 sets    Comments  one set on non compliant and one on compliant surface      Ambulation/Gait   Stairs  Yes    Stairs Assistance  5: Supervision    Stairs Assistance Details (indicate cue type and reason)  Pt does tend to turn sideways going down and grasp single rail with 2 hands    Stair Management Technique  One rail Left;Alternating pattern;Forwards    Number of Stairs  4    Height of Stairs  6      In // bars with 1-2 UE assist performed: SLS x 10 sec x 3 reps each side Hip extension, hip abduction, marching x 10 reps each side x 1  set Heel raises x 10 Toe raises x 10 Tandem stance x 10 sec x 2 reps   Performed taps to cones, double taps to cones.  Needing min UE HHA for support.      PT Education - 11/25/18 1440    Education Details  Fall prevention education, bringing handout of her HEP from Rehab on next visit so we can revise as needed    Person(s) Educated  Patient    Methods  Explanation;Handout    Comprehension  Verbalized understanding       PT Short Term Goals - 11/17/18 1207      PT SHORT TERM GOAL #1   Title  Patient will be independent with initial HEP in order to build upon functional gains in therapy. ALL STGs DUE 12/17/18    Time  4    Period  Weeks    Status  New    Target Date  12/17/18      PT SHORT TERM GOAL #2   Title  Patient will decrease 5 times sit <> stand score from low blue mat table to at least 23 seconds in order to improve functional LE strength.    Baseline  27.56 seconds    Time  4    Period  Weeks    Status  New      PT SHORT TERM GOAL #3   Title  Patient will increase BERG score to at least a 42/56 in order to decrease risk of falls.    Baseline  38/56    Time  4    Period  Weeks    Status  New      PT SHORT TERM GOAL #4   Title  Patient will decrease TUG score using LRAD to at least 19 seconds in order to decrease risk of falls.    Baseline  24.97 secondson 11/17/18    Time  4    Period  Weeks    Status  New        PT Long Term Goals - 11/17/18 1210      PT LONG TERM GOAL #1   Title  Patient will be independent with final HEP in order to build upon functional gains made in therapy. ALL LTGs DUE 01/16/19    Time  8    Period  Weeks    Status  New  Target Date  01/16/19      PT LONG TERM GOAL #2   Title  Patient will ascend/descend 2 steps using single handrail on L with supervision in order to safely enter/exit her home.    Baseline  Not yet assessed    Time  8    Period  Weeks    Status  New      PT LONG TERM GOAL #3   Title  Patient will  increase BERG score to at least a 46/56 in order to decrease risk of falls.    Baseline  38 on 11/17/18    Time  8    Period  Weeks    Status  New      PT LONG TERM GOAL #4   Title  Patient will decrease TUG score using LRAD to at least 16 seconds in order to decrease risk of falls.    Baseline  24.97 seconds on 11/17/18    Time  8    Period  Weeks    Status  New      PT LONG TERM GOAL #5   Title  Patient will decrease 5 times sit <> stand score from low blue mat table to at least 19 seconds in order to improve functional LE strength.    Time  8    Period  Weeks    Status  New      Additional Long Term Goals   Additional Long Term Goals  Yes      PT LONG TERM GOAL #6   Title  Patient will ambulate at least 250' over level surfaces while scanning environment with mod I in order to improve household and community mobility.    Time  8    Period  Weeks    Status  New            Plan - 11/25/18 1442    Clinical Impression Statement  Session focused on balance and strengthening exercises along with fall education.  Pt with decreased balance with SLS activities especially on the L.  Pt with difficulty describing her HEP so asked pt to bring in handout on next session so we can revise as needed.  Continue PT per POC.    Personal Factors and Comorbidities  Age;Comorbidity 3+    Comorbidities  HTN, peripheral neuropathy, HLD    Examination-Activity Limitations  Locomotion Level;Transfers;Stairs    Stability/Clinical Decision Making  Stable/Uncomplicated    Rehab Potential  Good    PT Frequency  2x / week    PT Duration  8 weeks    PT Treatment/Interventions  ADLs/Self Care Home Management;Functional mobility training;Stair training;Gait training;DME Instruction;Therapeutic activities;Therapeutic exercise;Balance training;Neuromuscular re-education;Patient/family education    PT Next Visit Plan  See if pt brought handout of her HEP from Rehab.  Chart states OTAGO but when I showed her  the OTAGO booklet she did not seem to recognize the exercises.  Increase difficulty/codense if needed). Balance activities.    Consulted and Agree with Plan of Care  Patient       Patient will benefit from skilled therapeutic intervention in order to improve the following deficits and impairments:  Abnormal gait, Decreased balance, Decreased coordination, Decreased strength, Difficulty walking, Postural dysfunction  Visit Diagnosis: Unsteadiness on feet  Other abnormalities of gait and mobility  Muscle weakness (generalized)     Problem List Patient Active Problem List   Diagnosis Date Noted  . Hemiparesis affecting left side as late effect of stroke ([place])   .  Neuropathy   . Acute blood loss anemia   . Hypoalbuminemia due to protein-calorie malnutrition ([place])   . Embolic stroke ([place]) [id number]  . Esophageal stricture 10/30/2018  . Family hx-stroke 10/27/2018  . Advanced age 40/27/2020  . 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 Thrombocytopenia ([place])   . Essential hypertension   . Hyperlipidemia   . Hypokalemia   . Lumbar stenosis with neurogenic claudication 07/03/2017  . Near syncope 11/10/2014  . Nocturnal leg cramps 07/15/2014  . Abnormality of gait 07/15/2014  . Polyneuropathy in other diseases classified elsewhere ([place]) 01/14/2013  . Memory changes 01/14/2013    Narda Bonds, PTA [place] 11/25/18 2:50 PM Phone: [phone] Fax: [facility] [address] [place], [place], [address] Phone: [phone]   Fax:  [phone]  Name: CHEETARA JOLLIFFE MRN: [id number] Date of Birth: January 27, 1932

## 2018-11-25 NOTE — Patient Instructions (Signed)

## 2018-11-27 ENCOUNTER — Other Ambulatory Visit: Payer: Self-pay

## 2018-11-27 ENCOUNTER — Encounter: Payer: Self-pay | Admitting: Physical Therapy

## 2018-11-27 ENCOUNTER — Ambulatory Visit: Payer: PPO | Admitting: Physical Therapy

## 2018-11-27 DIAGNOSIS — R2689 Other abnormalities of gait and mobility: Secondary | ICD-10-CM | POA: Diagnosis not present

## 2018-11-27 DIAGNOSIS — R278 Other lack of coordination: Secondary | ICD-10-CM

## 2018-11-27 DIAGNOSIS — I69354 Hemiplegia and hemiparesis following cerebral infarction affecting left non-dominant side: Secondary | ICD-10-CM

## 2018-11-27 DIAGNOSIS — M6281 Muscle weakness (generalized): Secondary | ICD-10-CM

## 2018-11-27 DIAGNOSIS — R2681 Unsteadiness on feet: Secondary | ICD-10-CM

## 2018-11-27 NOTE — Patient Instructions (Signed)
Access Code: [email or web address]  URL: [email or web address]  Date: 11/27/2018  Prepared by: Janann August   Exercises Seated Long Arc Quad - 10 reps - 2 sets - 3 hold - 2x daily - 7x weekly Heel rises with counter support - 10 reps - 2 sets - 2x daily - 7x weekly Standing Knee Flexion with Counter Support - 10 reps - 2 sets - 2x daily - 7x weekly Standing Hip Abduction with Counter Support - 10 reps - 3 sets - 1x daily - 7x weekly Standing Marching - 2x daily - 7x weekly Side Stepping with Counter Support - 2x daily - 7x weekly Tandem Walking with Counter Support - 2x daily - 7x weekly Sit to Stand with Armchair - 5 reps - 3 sets - 7x weekly

## 2018-11-27 NOTE — Therapy (Addendum)
[facility] [address] [place], [place], [address] Phone: [phone]   Fax:  [phone]  Physical Therapy Treatment  Patient Details  Name: Morgan Boyer MRN: [id number] Date of Birth: 10/13/1931 Referring Provider (PT): Charlett Blake, MD   Encounter Date: 11/27/2018  PT End of Session - 11/27/18 1207    Visit Number  3    Number of Visits  17    Date for PT Re-Evaluation  01/16/19    Authorization Type  [employer], VL: Follow Medicare guidelines    PT Start Time  1018    PT Stop Time  1058    PT Time Calculation (min)  40 min    Equipment Utilized During Treatment  Gait belt    Activity Tolerance  Patient tolerated treatment well    Behavior During Therapy  WFL for tasks assessed/performed       Past Medical History:  Diagnosis Date  . Abnormality of gait   . Adenomatous colon polyp   . Arthritis   . Back pain   . Cancer ([place])    skin cancers  . Depression   . Diverticulosis   . DJD (degenerative joint disease) of knee   . Dyslipidemia   . Esophageal reflux   . H/O hiatal hernia   . Heart murmur   . Herpes zoster    throat  . History of shingles    left throat 07/2007  . Hyperlipidemia   . Hypertension   . Memory changes 01/14/2013  . Neuromuscular disorder ([place])    peripheral neuropathy  . Nocturnal leg cramps 07/15/2014  . Peripheral neuropathy   . Polyneuropathy in other diseases classified elsewhere ([place]) 01/14/2013    Past Surgical History:  Procedure Laterality Date  . ABDOMINAL HYSTERECTOMY    . APPENDECTOMY    . BACK SURGERY  07/2017  . BUBBLE STUDY  10/31/2018   Procedure: BUBBLE STUDY;  Surgeon: Fay Records, MD;  Location: [facility] ENDOSCOPY;  Service: Cardiovascular;;  . COLON SURGERY     2001 diverticulitis with infection ..drained surgically .  Marland Kitchen EYE SURGERY     cat ext bil   . JOINT REPLACEMENT     right  . KNEE ARTHROSCOPY  11/08/2011  . LOOP RECORDER INSERTION N/A  10/31/2018   Procedure: LOOP RECORDER INSERTION;  Surgeon: Constance Haw, MD;  Location: [facility] CV LAB;  Service: Cardiovascular;  Laterality: N/A;  . TEE WITHOUT CARDIOVERSION N/A 10/31/2018   Procedure: TRANSESOPHAGEAL ECHOCARDIOGRAM (TEE);  Surgeon: Fay Records, MD;  Location: [facility] ENDOSCOPY;  Service: Cardiovascular;  Laterality: N/A;  . TONSILLECTOMY    . TOTAL KNEE ARTHROPLASTY  11/07/2011   Procedure: TOTAL KNEE ARTHROPLASTY;  Surgeon: Ninetta Lights, MD;  Location: [facility];  Service: Orthopedics;  Laterality: Left;  . TUBAL LIGATION    . WRIST SURGERY     for skin cancer    There were no vitals filed for this visit.  Subjective Assessment - 11/27/18 1021    Subjective  No falls, states that her balance still hasn't been that great.    Patient is accompained by:  Family member   son, [name]   Pertinent History  R frontal/parietal CVA, L occipital CVA on 10/26/2018, HTN, peripheral neuropathy, HLD, B knee replacements, back surgery 2019    Patient Stated Goals  wants to be able to walk better and wants to feel more stable  [facility] Adult PT Treatment/Exercise - 11/27/18 1210      Transfers   Transfers  Sit to Stand;Stand to Sit    Sit to Stand  5: Supervision    Sit to Stand Details  Verbal cues for technique;Verbal cues for sequencing;Visual cues/gestures for sequencing    Sit to Stand Details (indicate cue type and reason)  Verbal and demonstrative cueing for anterior weight shift before standing - cueing for glute activation in standing.     Comments  Performed mini squats at counter top (was given as exercise of prior HEP) - pt unable to perform correctly with both visual and demonstrative cueing. Removed from HEP program, will further review at future sessions.           Access Code: [email or web address]        URL: [email or web address]    Date: 11/27/2018  Prepared by: Janann August   Revised prior HEP from hospital -  removed exercises/added new ones/provided cueing for correct technique   Exercises Seated Long Arc Quad - 10 reps - 2 sets - 3 hold - 2x daily - 7x weekly --> cues for eccentric control  Heel rises with counter support - 10 reps - 2 sets - 2x daily - 7x weekly Standing Knee Flexion with Counter Support - 10 reps - 2 sets - 2x daily - 7x weekly Standing Hip Abduction with Counter Support - 10 reps - 3 sets - 1x daily - 7x weekly --> cueing for technique and form to activate hip ABD Standing Marching - 2x daily - 7x weekly --> up and down countertop  Side Stepping with Counter Support - 2x daily - 7x weekly --> -> up and down countertop  Tandem Walking with Counter Support - 2x daily - 7x weekly --> instead of tandem walking, gait with narrow BOS Sit to Stand with Armchair - 5 reps - 3 sets - 7x weekly      PT Education - 11/27/18 1207    Education Details  additions/revision of prior HEP    Person(s) Educated  Patient    Methods  Explanation;Handout;Demonstration;Verbal cues    Comprehension  Verbalized understanding;Returned demonstration       PT Short Term Goals - 11/17/18 1207      PT SHORT TERM GOAL #1   Title  Patient will be independent with initial HEP in order to build upon functional gains in therapy. ALL STGs DUE 12/17/18    Time  4    Period  Weeks    Status  New    Target Date  12/17/18      PT SHORT TERM GOAL #2   Title  Patient will decrease 5 times sit <> stand score from low blue mat table to at least 23 seconds in order to improve functional LE strength.    Baseline  27.56 seconds    Time  4    Period  Weeks    Status  New      PT SHORT TERM GOAL #3   Title  Patient will increase BERG score to at least a 42/56 in order to decrease risk of falls.    Baseline  38/56    Time  4    Period  Weeks    Status  New      PT SHORT TERM GOAL #4   Title  Patient will decrease TUG score using LRAD to at least 19 seconds in order to decrease risk of falls.     Baseline  24.97  secondson 11/17/18    Time  4    Period  Weeks    Status  New        PT Long Term Goals - 11/17/18 1210      PT LONG TERM GOAL #1   Title  Patient will be independent with final HEP in order to build upon functional gains made in therapy. ALL LTGs DUE 01/16/19    Time  8    Period  Weeks    Status  New    Target Date  01/16/19      PT LONG TERM GOAL #2   Title  Patient will ascend/descend 2 steps using single handrail on L with supervision in order to safely enter/exit her home.    Baseline  Not yet assessed    Time  8    Period  Weeks    Status  New      PT LONG TERM GOAL #3   Title  Patient will increase BERG score to at least a 46/56 in order to decrease risk of falls.    Baseline  38 on 11/17/18    Time  8    Period  Weeks    Status  New      PT LONG TERM GOAL #4   Title  Patient will decrease TUG score using LRAD to at least 16 seconds in order to decrease risk of falls.    Baseline  24.97 seconds on 11/17/18    Time  8    Period  Weeks    Status  New      PT LONG TERM GOAL #5   Title  Patient will decrease 5 times sit <> stand score from low blue mat table to at least 19 seconds in order to improve functional LE strength.    Time  8    Period  Weeks    Status  New      Additional Long Term Goals   Additional Long Term Goals  Yes      PT LONG TERM GOAL #6   Title  Patient will ambulate at least 250' over level surfaces while scanning environment with mod I in order to improve household and community mobility.    Time  8    Period  Weeks    Status  New           11/27/18 1212  Plan  Clinical Impression Statement Pt brought in prior HEP from hospital today. Reviewed with pt - upgraded/added exercises and removed mini squats at this time due to incorrect form (will further address at a later session). Pt required visual and verbal cues with each exercise and was able to demonstrate all correctly. Will review at next session as well in order  for correct carryover. Will continue to progress towards LTGs.  Personal Factors and Comorbidities Age;Comorbidity 3+  Comorbidities HTN, peripheral neuropathy, HLD  Examination-Activity Limitations Locomotion Level;Transfers;Stairs  Pt will benefit from skilled therapeutic intervention in order to improve on the following deficits Abnormal gait;Decreased balance;Decreased coordination;Decreased strength;Difficulty walking;Postural dysfunction  Stability/Clinical Decision Making Stable/Uncomplicated  Rehab Potential Good  PT Frequency 2x / week  PT Duration 8 weeks  PT Treatment/Interventions ADLs/Self Care Home Management;Functional mobility training;Stair training;Gait training;DME Instruction;Therapeutic activities;Therapeutic exercise;Balance training;Neuromuscular re-education;Patient/family education  PT Next Visit Plan review HEP, mini squats, sit to stands. corner balance exercises.  PT Home Exercise Plan [id number]  Consulted and Agree with Plan of Care Patient  Patient will benefit from skilled therapeutic intervention in order to improve the following deficits and impairments:     Visit Diagnosis: Unsteadiness on feet  Other abnormalities of gait and mobility  Muscle weakness (generalized)  Hemiplegia and hemiparesis following cerebral infarction affecting left non-dominant side (HCC)  Other lack of coordination  Hemiparesis affecting left side as late effect of stroke [facility])     Problem List Patient Active Problem List   Diagnosis Date Noted  . Hemiparesis affecting left side as late effect of stroke ([place])   . Neuropathy   . Acute blood loss anemia   . Hypoalbuminemia due to protein-calorie malnutrition ([place])   . Embolic stroke ([place]) [id number]  . Esophageal stricture 10/30/2018  . Family hx-stroke 10/27/2018  . Advanced age 50/27/2020  . 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 Thrombocytopenia ([place])   .  Essential hypertension   . Hyperlipidemia   . Hypokalemia   . Lumbar stenosis with neurogenic claudication 07/03/2017  . Near syncope 11/10/2014  . Nocturnal leg cramps 07/15/2014  . Abnormality of gait 07/15/2014  . Polyneuropathy in other diseases classified elsewhere ([place]) 01/14/2013  . Memory changes 01/14/2013    Arliss Journey, PT, DPT 11/27/2018, 12:12 PM  [facility] [address] [place], [place], [address] Phone: [phone]   Fax:  [phone]  Name: JOURNIEE DRIER MRN: [id number] Date of Birth: [birth year]

## 2018-12-01 ENCOUNTER — Ambulatory Visit: Payer: PPO | Admitting: Occupational Therapy

## 2018-12-01 ENCOUNTER — Ambulatory Visit: Payer: PPO | Admitting: Physical Therapy

## 2018-12-01 ENCOUNTER — Encounter: Payer: Self-pay | Admitting: Physical Therapy

## 2018-12-01 ENCOUNTER — Other Ambulatory Visit: Payer: Self-pay

## 2018-12-01 ENCOUNTER — Encounter: Payer: Self-pay | Admitting: Occupational Therapy

## 2018-12-01 DIAGNOSIS — I69354 Hemiplegia and hemiparesis following cerebral infarction affecting left non-dominant side: Secondary | ICD-10-CM

## 2018-12-01 DIAGNOSIS — R2689 Other abnormalities of gait and mobility: Secondary | ICD-10-CM | POA: Diagnosis not present

## 2018-12-01 DIAGNOSIS — R278 Other lack of coordination: Secondary | ICD-10-CM

## 2018-12-01 DIAGNOSIS — M6281 Muscle weakness (generalized): Secondary | ICD-10-CM

## 2018-12-01 DIAGNOSIS — R2681 Unsteadiness on feet: Secondary | ICD-10-CM

## 2018-12-01 NOTE — Patient Instructions (Signed)
Do your hand exercises every single day.   1. Grip Strengthening (Resistive Putty) Red Putty.  Make sure you do LONG, HARD squeezes. Make sure you make the putty like a fat hot dog between each squeeze.    Squeeze putty using thumb and all fingers. Repeat _20___ times. Do __2__ sessions per day.   2. Roll putty into a long snake on table and pinch between your thumb, index and middle fingers. Do 5.  Do 2 sessions per day. Make sure you roll it out like snack after each trial.    Coordination Activities  Perform the following activities for 15-20  minutes 1-2 times per day with left hand(s). It is ok to rest your hand as you work on these activities if you need to.     Rotate ball in fingertips (clockwise and counter-clockwise).  Toss ball between hands. Make sure you truly throw it, don't just put into your hands.  Toss ball in air and catch with the same hand. G slow and try and control it. Do the whole deck. Don't slide the cards to the edge of the table make sure you pick them up one a time.   Flip cards 1 at a time as fast as you can.  Pick up coins and place in container or coin bank.  Pick up coins and make stacks of 5 pennies.  Pick up coins one at a time until you get 5-10 in your hand, then place them one at a time in the bank without dropping them.  Screw together nuts and bolts, then unfasten. Make sure your left hand does the turning. Use smaller nuts and bolts.      Copyright  VHI. All rights reserved.

## 2018-12-01 NOTE — Therapy (Addendum)
[facility] [address] [place], [place], [address] Phone: [phone]   Fax:  [phone]  Occupational Therapy Treatment  Patient Details  Name: Morgan Boyer MRN: [id number] Date of Birth: Oct 19, 1931 Referring Provider (OT): Dr. Letta Pate   Encounter Date: 12/01/2018  OT End of Session - 12/01/18 1036    Visit Number  2    Number of Visits  5    Date for OT Re-Evaluation  12/15/18    Authorization Type  [employer] - follow medicare guidelines for VL    Authorization Time Period  90 days    OT Start Time  0846    OT Stop Time  0929    OT Time Calculation (min)  43 min    Activity Tolerance  Patient tolerated treatment well       Past Medical History:  Diagnosis Date  . Abnormality of gait   . Adenomatous colon polyp   . Arthritis   . Back pain   . Cancer ([place])    skin cancers  . Depression   . Diverticulosis   . DJD (degenerative joint disease) of knee   . Dyslipidemia   . Esophageal reflux   . H/O hiatal hernia   . Heart murmur   . Herpes zoster    throat  . History of shingles    left throat 07/2007  . Hyperlipidemia   . Hypertension   . Memory changes 01/14/2013  . Neuromuscular disorder ([place])    peripheral neuropathy  . Nocturnal leg cramps 07/15/2014  . Peripheral neuropathy   . Polyneuropathy in other diseases classified elsewhere ([place]) 01/14/2013    Past Surgical History:  Procedure Laterality Date  . ABDOMINAL HYSTERECTOMY    . APPENDECTOMY    . BACK SURGERY  07/2017  . BUBBLE STUDY  10/31/2018   Procedure: BUBBLE STUDY;  Surgeon: Fay Records, MD;  Location: [facility] ENDOSCOPY;  Service: Cardiovascular;;  . COLON SURGERY     2001 diverticulitis with infection ..drained surgically .  Marland Kitchen EYE SURGERY     cat ext bil   . JOINT REPLACEMENT     right  . KNEE ARTHROSCOPY  11/08/2011  . LOOP RECORDER INSERTION N/A 10/31/2018   Procedure: LOOP RECORDER INSERTION;  Surgeon: Constance Haw, MD;  Location: [facility] CV LAB;  Service: Cardiovascular;  Laterality: N/A;  . TEE WITHOUT CARDIOVERSION N/A 10/31/2018   Procedure: TRANSESOPHAGEAL ECHOCARDIOGRAM (TEE);  Surgeon: Fay Records, MD;  Location: [facility] ENDOSCOPY;  Service: Cardiovascular;  Laterality: N/A;  . TONSILLECTOMY    . TOTAL KNEE ARTHROPLASTY  11/07/2011   Procedure: TOTAL KNEE ARTHROPLASTY;  Surgeon: Ninetta Lights, MD;  Location: [facility];  Service: Orthopedics;  Laterality: Left;  . TUBAL LIGATION    . WRIST SURGERY     for skin cancer    There were no vitals filed for this visit.  Subjective Assessment - 12/01/18 0851    Subjective   This is too early - I am glad the rest of my appts are late in the day    Pertinent History  R frontal/parietal CVA, L occipital CVA on 10/26/2018. PMH:    Patient Stated Goals  To be able to walk better and have better balance    Currently in Pain?  No/denies                   OT Treatments/Exercises (OP) - 12/01/18 0001      ADLs  ADL Comments  Reviewed OT goals and POC and pt in agreement. Pt reports today she has no wrist discomfort and feels her hand is a little stronger.      Neurological Re-education Exercises   Other Exercises 1  Initiated HEP focused on L grip/pinch strength and coordination.  After instruction and practice, pt able to return demonstrate all activities. See pt instruction section for details. Pt did demonstrate some distractability and required increased time for new learning. Discussed with pt that she does have an order for ST eval and recommended that pt follow through with assessment to asses current functional status. Pt in agreement.               OT Education - 12/01/18 1034    Education Details  HEP for LUE for grip/pinch strength,  coordination    Person(s) Educated  Patient    Methods  Explanation;Demonstration;Verbal cues;Handout    Comprehension  Verbalized understanding;Returned demonstration          OT  Long Term Goals - 12/01/18 1035      OT LONG TERM GOAL #1   Title  Pt will be mod I for HEP for grip strength, wrist extension strength and coordination of LUE - 12/15/2018    Status  On-going      OT LONG TERM GOAL #2   Title  Pt will be mod with splint wear and care for LUE wrist support PRN    Status  On-going      OT LONG TERM GOAL #3   Title  Pt will be mod I with simple hot meal prep    Status  On-going            Plan - 12/01/18 1035    Clinical Impression Statement  Pt in agreement with OT POC and goals.  Pt progresing toward goals. Pt has agreed to scheduling ST eval per MD order.    OT Occupational Profile and History  Detailed Assessment- Review of Records and additional review of physical, cognitive, psychosocial history related to current functional performance    Occupational performance deficits (Please refer to evaluation for details):  IADL's;Leisure;Rest and Sleep;ADL's    Body Structure / Function / Physical Skills  ADL;Balance;FMC;IADL;GMC;UE functional use;Strength    Clinical Decision Making  Limited treatment options, no task modification necessary    Comorbidities Affecting Occupational Performance:  May have comorbidities impacting occupational performance    Modification or Assistance to Complete Evaluation   Min-Moderate modification of tasks or assist with assess necessary to complete eval    OT Frequency  2x / week    OT Duration  2 weeks    OT Treatment/Interventions  Self-care/ADL training;DME and/or AE instruction;Neuromuscular education;Therapeutic exercise;Therapeutic activities;Splinting;Patient/family education    Plan  check HEP, add wrist extension exercises; grip strength, coordination    Consulted and Agree with Plan of Care  Patient       Patient will benefit from skilled therapeutic intervention in order to improve the following deficits and impairments:   Body Structure / Function / Physical Skills: ADL, Balance, FMC, IADL, GMC, UE  functional use, Strength       Visit Diagnosis: Hemiplegia and hemiparesis following cerebral infarction affecting left non-dominant side (HCC)  Other lack of coordination    Problem List Patient Active Problem List   Diagnosis Date Noted  . Hemiparesis affecting left side as late effect of stroke ([place])   . Neuropathy   . Acute blood loss anemia   .  Hypoalbuminemia due to protein-calorie malnutrition ([place])   . Embolic stroke ([place]) [id number]  . Esophageal stricture 10/30/2018  . Family hx-stroke 10/27/2018  . Advanced age 85/27/2020  . 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 Thrombocytopenia ([place])   . Essential hypertension   . Hyperlipidemia   . Hypokalemia   . Lumbar stenosis with neurogenic claudication 07/03/2017  . Near syncope 11/10/2014  . Nocturnal leg cramps 07/15/2014  . Abnormality of gait 07/15/2014  . Polyneuropathy in other diseases classified elsewhere ([place]) 01/14/2013  . Memory changes 01/14/2013    Quay Burow, OTR/L 12/01/2018, 10:48 AM  [facility] [address] [place], [place], [address] Phone: [phone]   Fax:  [phone]  Name: Morgan Boyer MRN: [id number] Date of Birth: 12/20/1931

## 2018-12-01 NOTE — Therapy (Signed)
[facility] [address] [place], [place], [address] Phone: [phone]   Fax:  [phone]  Physical Therapy Treatment  Patient Details  Name: Morgan Boyer MRN: [id number] Date of Birth: 02/08/1932 Referring Provider (PT): Morgan Pate Luanna Salk, MD   Encounter Date: 12/01/2018  PT End of Session - 12/01/18 1011    Visit Number  4    Number of Visits  17    Date for PT Re-Evaluation  01/16/19    Authorization Type  [employer], VL: Follow Medicare guidelines    PT Start Time  0800    PT Stop Time  0843    PT Time Calculation (min)  43 min    Activity Tolerance  Patient tolerated treatment well    Behavior During Therapy  [facility] for tasks assessed/performed;Flat affect       Past Medical History:  Diagnosis Date  . Abnormality of gait   . Adenomatous colon polyp   . Arthritis   . Back pain   . Cancer ([place])    skin cancers  . Depression   . Diverticulosis   . DJD (degenerative joint disease) of knee   . Dyslipidemia   . Esophageal reflux   . H/O hiatal hernia   . Heart murmur   . Herpes zoster    throat  . History of shingles    left throat 07/2007  . Hyperlipidemia   . Hypertension   . Memory changes 01/14/2013  . Neuromuscular disorder ([place])    peripheral neuropathy  . Nocturnal leg cramps 07/15/2014  . Peripheral neuropathy   . Polyneuropathy in other diseases classified elsewhere ([place]) 01/14/2013    Past Surgical History:  Procedure Laterality Date  . ABDOMINAL HYSTERECTOMY    . APPENDECTOMY    . BACK SURGERY  07/2017  . BUBBLE STUDY  10/31/2018   Procedure: BUBBLE STUDY;  Surgeon: Fay Records, MD;  Location: [facility] ENDOSCOPY;  Service: Cardiovascular;;  . COLON SURGERY     2001 diverticulitis with infection ..drained surgically .  Marland Kitchen EYE SURGERY     cat ext bil   . JOINT REPLACEMENT     right  . KNEE ARTHROSCOPY  11/08/2011  . LOOP RECORDER INSERTION N/A 10/31/2018   Procedure: LOOP RECORDER  INSERTION;  Surgeon: Constance Haw, MD;  Location: [facility] CV LAB;  Service: Cardiovascular;  Laterality: N/A;  . TEE WITHOUT CARDIOVERSION N/A 10/31/2018   Procedure: TRANSESOPHAGEAL ECHOCARDIOGRAM (TEE);  Surgeon: Fay Records, MD;  Location: [facility] ENDOSCOPY;  Service: Cardiovascular;  Laterality: N/A;  . TONSILLECTOMY    . TOTAL KNEE ARTHROPLASTY  11/07/2011   Procedure: TOTAL KNEE ARTHROPLASTY;  Surgeon: Ninetta Lights, MD;  Location: [facility];  Service: Orthopedics;  Laterality: Left;  . TUBAL LIGATION    . WRIST SURGERY     for skin cancer    There were no vitals filed for this visit.  Subjective Assessment - 12/01/18 0805    Subjective  No changes since last week. No falls. Has been doing the exercises at home.    Patient is accompained by:  Family member   son, Morgan Boyer   Pertinent History  R frontal/parietal CVA, L occipital CVA on 10/26/2018, HTN, peripheral neuropathy, HLD, B knee replacements, back surgery 2019    Patient Stated Goals  wants to be able to walk better and wants to feel more stable  [facility] Adult PT Treatment/Exercise - 12/01/18 0001      Transfers   Comments  Reviewed Mini squats at countertop due to pt unable to perform correctly during last visit - verbal and demonstrative cueing for technique. Attempted 1 x 10 reps. Pt unable to sequence consistently, despite cueing will perform with incorrect form and anterior weight shifting over B knees vs. sitting back towards chair. On blue mat table 2 x 10 reps sit to stands with BLE on blue foam pad, no UE support.  Cueing for translating COG anteriorly before standing (back of chair in front of pt for visual cueing and verbal cueing from therapist). Focus on eccentric control when descending into chair.      Ambulation/Gait   Ambulation/Gait  Yes    Ambulation/Gait Assistance  5: Supervision    Ambulation/Gait Assistance Details  Cueing for increased step lenth, reciprocal arm  swing.     Ambulation Distance (Feet)  115 Feet    Assistive device  None    Gait Pattern  Step-through pattern;Decreased arm swing - left;Decreased weight shift to left;Decreased step length - right;Decreased stance time - left;Decreased trunk rotation;Trunk flexed;Wide base of support   B genu valgum     High Level Balance   High Level Balance Comments  At counter top with fingertip support: Weight shifting on heels then raising toes 1 x 15 reps, Staggered stance weight shifting A/P 1 x 10 reps B, with lifting heel/lifting toe - demonstrative cueing throughout for technique. In corner with chair in front and min guard on pillow: feet together eyes closed 4 x 30 seconds, modified tandem stance with eyes open B 3 x 30 seconds.               PT Short Term Goals - 11/17/18 1207      PT SHORT TERM GOAL #1   Title  Patient will be independent with initial HEP in order to build upon functional gains in therapy. ALL STGs DUE 12/17/18    Time  4    Period  Weeks    Status  New    Target Date  12/17/18      PT SHORT TERM GOAL #2   Title  Patient will decrease 5 times sit <> stand score from low blue mat table to at least 23 seconds in order to improve functional LE strength.    Baseline  27.56 seconds    Time  4    Period  Weeks    Status  New      PT SHORT TERM GOAL #3   Title  Patient will increase BERG score to at least a 42/56 in order to decrease risk of falls.    Baseline  38/56    Time  4    Period  Weeks    Status  New      PT SHORT TERM GOAL #4   Title  Patient will decrease TUG score using LRAD to at least 19 seconds in order to decrease risk of falls.    Baseline  24.97 secondson 11/17/18    Time  4    Period  Weeks    Status  New        PT Long Term Goals - 11/17/18 1210      PT LONG TERM GOAL #1   Title  Patient will be independent with final HEP in order to build upon functional gains made in therapy. ALL LTGs DUE 01/16/19    Time  8    Period  Weeks     Status  New    Target Date  01/16/19      PT LONG TERM GOAL #2   Title  Patient will ascend/descend 2 steps using single handrail on L with supervision in order to safely enter/exit her home.    Baseline  Not yet assessed    Time  8    Period  Weeks    Status  New      PT LONG TERM GOAL #3   Title  Patient will increase BERG score to at least a 46/56 in order to decrease risk of falls.    Baseline  38 on 11/17/18    Time  8    Period  Weeks    Status  New      PT LONG TERM GOAL #4   Title  Patient will decrease TUG score using LRAD to at least 16 seconds in order to decrease risk of falls.    Baseline  24.97 seconds on 11/17/18    Time  8    Period  Weeks    Status  New      PT LONG TERM GOAL #5   Title  Patient will decrease 5 times sit <> stand score from low blue mat table to at least 19 seconds in order to improve functional LE strength.    Time  8    Period  Weeks    Status  New      Additional Long Term Goals   Additional Long Term Goals  Yes      PT LONG TERM GOAL #6   Title  Patient will ambulate at least 250' over level surfaces while scanning environment with mod I in order to improve household and community mobility.    Time  8    Period  Weeks    Status  New            Plan - 12/01/18 1029    Clinical Impression Statement  Focus of today's session was balance on compliant surfaces and LE strengthening. Pt needing frequent verbal and demonstrative cueing for technique for mini squats, sit to stands, and standing A/P weight shifting. Pt unable to perform mini squats with correct technique despite verbal and visual cueing. Will continue to review sit to stand technique at future sessions in order to demonstrate carry over. Will continue to progress towards LTGs.    Personal Factors and Comorbidities  Age;Comorbidity 3+    Comorbidities  HTN, peripheral neuropathy, HLD    Examination-Activity Limitations  Locomotion Level;Transfers;Stairs    Stability/Clinical  Decision Making  Stable/Uncomplicated    Rehab Potential  Good    PT Frequency  2x / week    PT Duration  8 weeks    PT Treatment/Interventions  ADLs/Self Care Home Management;Functional mobility training;Stair training;Gait training;DME Instruction;Therapeutic activities;Therapeutic exercise;Balance training;Neuromuscular re-education;Patient/family education    PT Next Visit Plan  sit to stands. Balance on rocker board. stepping strategy off blue foam. Gait with head turns. NuStep for coordination/LE strength.    PT Home Exercise Plan  [id number]    Consulted and Agree with Plan of Care  Patient       Patient will benefit from skilled therapeutic intervention in order to improve the following deficits and impairments:  Abnormal gait, Decreased balance, Decreased coordination, Decreased strength, Difficulty walking, Postural dysfunction  Visit Diagnosis: Unsteadiness on feet  Other abnormalities of gait and mobility  Muscle weakness (generalized)  Hemiplegia and hemiparesis following cerebral infarction affecting left non-dominant side (HCC)  Other lack of coordination     Problem List Patient Active Problem List   Diagnosis Date Noted  . Hemiparesis affecting left side as late effect of stroke ([place])   . Neuropathy   . Acute blood loss anemia   . Hypoalbuminemia due to protein-calorie malnutrition ([place])   . Embolic stroke ([place]) [id number]  . Esophageal stricture 10/30/2018  . Family hx-stroke 10/27/2018  . Advanced age 21/27/2020  . 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 Thrombocytopenia ([place])   . Essential hypertension   . Hyperlipidemia   . Hypokalemia   . Lumbar stenosis with neurogenic claudication 07/03/2017  . Near syncope 11/10/2014  . Nocturnal leg cramps 07/15/2014  . Abnormality of gait 07/15/2014  . Polyneuropathy in other diseases classified elsewhere ([place]) 01/14/2013  . Memory changes 01/14/2013    Arliss Journey, PT, DPT 12/01/2018, 10:33 AM  [facility] [address] [place], [place], [address] Phone: [phone]   Fax:  [phone]  Name: Morgan Boyer MRN: [id number] Date of Birth: 07-30-1931

## 2018-12-03 ENCOUNTER — Ambulatory Visit: Payer: PPO | Attending: Physical Medicine and Rehabilitation | Admitting: Physical Therapy

## 2018-12-03 ENCOUNTER — Other Ambulatory Visit: Payer: Self-pay

## 2018-12-03 ENCOUNTER — Other Ambulatory Visit: Payer: Self-pay | Admitting: Physical Medicine and Rehabilitation

## 2018-12-03 ENCOUNTER — Ambulatory Visit: Payer: PPO | Admitting: Occupational Therapy

## 2018-12-03 ENCOUNTER — Ambulatory Visit (INDEPENDENT_AMBULATORY_CARE_PROVIDER_SITE_OTHER): Payer: PPO | Admitting: *Deleted

## 2018-12-03 VITALS — BP 133/74 | HR 69

## 2018-12-03 DIAGNOSIS — R278 Other lack of coordination: Secondary | ICD-10-CM | POA: Diagnosis not present

## 2018-12-03 DIAGNOSIS — I69354 Hemiplegia and hemiparesis following cerebral infarction affecting left non-dominant side: Secondary | ICD-10-CM

## 2018-12-03 DIAGNOSIS — R2681 Unsteadiness on feet: Secondary | ICD-10-CM | POA: Diagnosis not present

## 2018-12-03 DIAGNOSIS — R2689 Other abnormalities of gait and mobility: Secondary | ICD-10-CM

## 2018-12-03 DIAGNOSIS — I63411 Cerebral infarction due to embolism of right middle cerebral artery: Secondary | ICD-10-CM | POA: Diagnosis not present

## 2018-12-03 DIAGNOSIS — M6281 Muscle weakness (generalized): Secondary | ICD-10-CM

## 2018-12-03 NOTE — Therapy (Signed)
[facility] [name] [place], [place], [address] Phone: [phone]   Fax:  [phone]  Physical Therapy Treatment  Patient Details  Name: Morgan Boyer MRN: [id number] Date of Birth: 1931/06/18 Referring Provider (PT): Letta Pate Luanna Salk, MD   Encounter Date: 12/03/2018  PT End of Session - 12/03/18 1558    Visit Number  5    Number of Visits  17    Date for PT Re-Evaluation  01/16/19    Authorization Type  [employer], VL: Follow Medicare guidelines    PT Start Time  1402    PT Stop Time  1446    PT Time Calculation (min)  44 min    Equipment Utilized During Treatment  Gait belt    Activity Tolerance  Patient tolerated treatment well    Behavior During Therapy  WFL for tasks assessed/performed       Past Medical History:  Diagnosis Date  . Abnormality of gait   . Adenomatous colon polyp   . Arthritis   . Back pain   . Cancer ([place])    skin cancers  . Depression   . Diverticulosis   . DJD (degenerative joint disease) of knee   . Dyslipidemia   . Esophageal reflux   . H/O hiatal hernia   . Heart murmur   . Herpes zoster    throat  . History of shingles    left throat 07/2007  . Hyperlipidemia   . Hypertension   . Memory changes 01/14/2013  . Neuromuscular disorder ([place])    peripheral neuropathy  . Nocturnal leg cramps 07/15/2014  . Peripheral neuropathy   . Polyneuropathy in other diseases classified elsewhere ([place]) 01/14/2013    Past Surgical History:  Procedure Laterality Date  . ABDOMINAL HYSTERECTOMY    . APPENDECTOMY    . BACK SURGERY  07/2017  . BUBBLE STUDY  10/31/2018   Procedure: BUBBLE STUDY;  Surgeon: Fay Records, MD;  Location: [facility] ENDOSCOPY;  Service: Cardiovascular;;  . COLON SURGERY     2001 diverticulitis with infection ..drained surgically .  Marland Kitchen EYE SURGERY     cat ext bil   . JOINT REPLACEMENT     right  . KNEE ARTHROSCOPY  11/08/2011  . LOOP RECORDER INSERTION N/A  10/31/2018   Procedure: LOOP RECORDER INSERTION;  Surgeon: Constance Haw, MD;  Location: [facility] CV LAB;  Service: Cardiovascular;  Laterality: N/A;  . TEE WITHOUT CARDIOVERSION N/A 10/31/2018   Procedure: TRANSESOPHAGEAL ECHOCARDIOGRAM (TEE);  Surgeon: Fay Records, MD;  Location: [facility] ENDOSCOPY;  Service: Cardiovascular;  Laterality: N/A;  . TONSILLECTOMY    . TOTAL KNEE ARTHROPLASTY  11/07/2011   Procedure: TOTAL KNEE ARTHROPLASTY;  Surgeon: Ninetta Lights, MD;  Location: [facility];  Service: Orthopedics;  Laterality: Left;  . TUBAL LIGATION    . WRIST SURGERY     for skin cancer    Vitals:   12/03/18 1407  BP: 133/74  Pulse: 69    Subjective Assessment - 12/03/18 1407    Subjective  Feeling more tired today. Gets tired really easily since the stroke.    Patient is accompained by:  Family member   son, Morgan Boyer   Pertinent History  R frontal/parietal CVA, L occipital CVA on 10/26/2018, HTN, peripheral neuropathy, HLD, B knee replacements, back surgery 2019    Patient Stated Goals  wants to be able to walk better and wants to feel more stable  [facility] Adult PT Treatment/Exercise - 12/03/18 1413      Ambulation/Gait   Ambulation/Gait  Yes    Ambulation/Gait Assistance  5: Supervision;4: Min guard    Ambulation/Gait Assistance Details  Ambulated around clinic while scanning environment - therapist asking pt to name objects of different colors/how many balls/cabinets etc. Pt with intermittent episodes of needing to stop gait and look with therapist cues to keep walking. Ambulated around clinic while conversing with therapist, needed min guard for one episode of narrow BOS and scissoring. Needs cueing for reciprocal arm swing and step length.    Ambulation Distance (Feet)  350 Feet    Assistive device  None    Gait Pattern  Step-through pattern;Decreased arm swing - left;Decreased weight shift to left;Decreased step length - right;Decreased stance  time - left;Decreased trunk rotation;Trunk flexed;Wide base of support   B genu valgum    Ambulation Surface  Level;Indoor      High Level Balance   High Level Balance Comments  In // bars with min guard for all balance activities: on rocker board weight shifting A/P 1 x 15 reps then progressing to static holds on board and progressing to feet apart eyes closed, pt only able to hold for a couple of seconds before needing to hold onto // bars. Followed by weight shifting L/R, pt needing more UE support on bars and needed assistance shifting weight to the L. With focus on SLS had a circular bubble ball on floor in front of pt, toe taps 2 x 10 reps B - with frequent cueing on the task at hand and how to perform. Manual facilitation from therapist to help shift weight to L.       Exercises   Exercises  Other Exercises    Other Exercises   SciFit Level 3.0 for 7 minutes for increased LE strength and coordination.                PT Short Term Goals - 11/17/18 1207      PT SHORT TERM GOAL #1   Title  Patient will be independent with initial HEP in order to build upon functional gains in therapy. ALL STGs DUE 12/17/18    Time  4    Period  Weeks    Status  New    Target Date  12/17/18      PT SHORT TERM GOAL #2   Title  Patient will decrease 5 times sit <> stand score from low blue mat table to at least 23 seconds in order to improve functional LE strength.    Baseline  27.56 seconds    Time  4    Period  Weeks    Status  New      PT SHORT TERM GOAL #3   Title  Patient will increase BERG score to at least a 42/56 in order to decrease risk of falls.    Baseline  38/56    Time  4    Period  Weeks    Status  New      PT SHORT TERM GOAL #4   Title  Patient will decrease TUG score using LRAD to at least 19 seconds in order to decrease risk of falls.    Baseline  24.97 secondson 11/17/18    Time  4    Period  Weeks    Status  New        PT Long Term Goals - 11/17/18 1210  PT LONG TERM GOAL #1   Title  Patient will be independent with final HEP in order to build upon functional gains made in therapy. ALL LTGs DUE 01/16/19    Time  8    Period  Weeks    Status  New    Target Date  01/16/19      PT LONG TERM GOAL #2   Title  Patient will ascend/descend 2 steps using single handrail on L with supervision in order to safely enter/exit her home.    Baseline  Not yet assessed    Time  8    Period  Weeks    Status  New      PT LONG TERM GOAL #3   Title  Patient will increase BERG score to at least a 46/56 in order to decrease risk of falls.    Baseline  38 on 11/17/18    Time  8    Period  Weeks    Status  New      PT LONG TERM GOAL #4   Title  Patient will decrease TUG score using LRAD to at least 16 seconds in order to decrease risk of falls.    Baseline  24.97 seconds on 11/17/18    Time  8    Period  Weeks    Status  New      PT LONG TERM GOAL #5   Title  Patient will decrease 5 times sit <> stand score from low blue mat table to at least 19 seconds in order to improve functional LE strength.    Time  8    Period  Weeks    Status  New      Additional Long Term Goals   Additional Long Term Goals  Yes      PT LONG TERM GOAL #6   Title  Patient will ambulate at least 250' over level surfaces while scanning environment with mod I in order to improve household and community mobility.    Time  8    Period  Weeks    Status  New            Plan - 12/03/18 1559    Clinical Impression Statement  Focus of today's session was dual tasking with gait, SLS, and balance on compliance surface. While scanning environment, pt at times needed to stop to scan and needed verbal cues from therapist to keep ambulating. Pt had increased difficulty today understanding therapist's instructions for SLS taps and needed therapist to demonstrate multiple times before understanding. Had increased difficulty with eyes closed on rocker board, with pt reporting those  balance exercises are difficult. Will continue to progress towards LTGs.    Personal Factors and Comorbidities  Age;Comorbidity 3+    Comorbidities  HTN, peripheral neuropathy, HLD    Examination-Activity Limitations  Locomotion Level;Transfers;Stairs    Stability/Clinical Decision Making  Stable/Uncomplicated    Rehab Potential  Good    PT Frequency  2x / week    PT Duration  8 weeks    PT Treatment/Interventions  ADLs/Self Care Home Management;Functional mobility training;Stair training;Gait training;DME Instruction;Therapeutic activities;Therapeutic exercise;Balance training;Neuromuscular re-education;Patient/family education    PT Next Visit Plan  sit to stands. balance exercises on foam and compliant surfaces and with EC. stepping strategy off blue foam. Gait while scanning environment/ head turns/head nods. NuStep for coordination/LE strength.    PT Home Exercise Plan  [id number]    Consulted and Agree with Plan of Care  Patient  Patient will benefit from skilled therapeutic intervention in order to improve the following deficits and impairments:  Abnormal gait, Decreased balance, Decreased coordination, Decreased strength, Difficulty walking, Postural dysfunction  Visit Diagnosis: Hemiplegia and hemiparesis following cerebral infarction affecting left non-dominant side (HCC)  Other lack of coordination  Unsteadiness on feet  Other abnormalities of gait and mobility  Muscle weakness (generalized)     Problem List Patient Active Problem List   Diagnosis Date Noted  . Hemiparesis affecting left side as late effect of stroke ([place])   . Neuropathy   . Acute blood loss anemia   . Hypoalbuminemia due to protein-calorie malnutrition ([place])   . Embolic stroke ([place]) [id number]  . Esophageal stricture 10/30/2018  . Family hx-stroke 10/27/2018  . Advanced age 52/27/2020  . 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 Thrombocytopenia  ([place])   . Essential hypertension   . Hyperlipidemia   . Hypokalemia   . Lumbar stenosis with neurogenic claudication 07/03/2017  . Near syncope 11/10/2014  . Nocturnal leg cramps 07/15/2014  . Abnormality of gait 07/15/2014  . Polyneuropathy in other diseases classified elsewhere ([place]) 01/14/2013  . Memory changes 01/14/2013    Arliss Journey, PT, DPT 12/03/2018, 4:03 PM  [facility] [address] [place], [place], [address] Phone: [phone]   Fax:  [phone]  Name: SHETARA BERROA MRN: [id number] Date of Birth: 01-04-1932

## 2018-12-04 LAB — CUP PACEART REMOTE DEVICE CHECK
Date Time Interrogation Session: 20200902200850
Implantable Pulse Generator Implant Date: 20200731

## 2018-12-05 ENCOUNTER — Ambulatory Visit: Payer: PPO

## 2018-12-08 ENCOUNTER — Other Ambulatory Visit: Payer: Self-pay | Admitting: Physical Medicine and Rehabilitation

## 2018-12-10 ENCOUNTER — Ambulatory Visit: Payer: PPO | Admitting: Occupational Therapy

## 2018-12-10 ENCOUNTER — Ambulatory Visit: Payer: PPO | Admitting: Physical Therapy

## 2018-12-11 DIAGNOSIS — I6930 Unspecified sequelae of cerebral infarction: Secondary | ICD-10-CM | POA: Diagnosis not present

## 2018-12-11 DIAGNOSIS — I10 Essential (primary) hypertension: Secondary | ICD-10-CM | POA: Diagnosis not present

## 2018-12-15 ENCOUNTER — Ambulatory Visit: Payer: PPO | Admitting: Physical Therapy

## 2018-12-15 ENCOUNTER — Ambulatory Visit: Payer: PPO | Admitting: Occupational Therapy

## 2018-12-17 ENCOUNTER — Ambulatory Visit: Payer: PPO | Admitting: Physical Therapy

## 2018-12-17 ENCOUNTER — Other Ambulatory Visit: Payer: Self-pay

## 2018-12-17 ENCOUNTER — Encounter: Payer: Self-pay | Admitting: Physical Therapy

## 2018-12-17 DIAGNOSIS — R2689 Other abnormalities of gait and mobility: Secondary | ICD-10-CM

## 2018-12-17 DIAGNOSIS — R2681 Unsteadiness on feet: Secondary | ICD-10-CM

## 2018-12-17 DIAGNOSIS — M6281 Muscle weakness (generalized): Secondary | ICD-10-CM

## 2018-12-17 DIAGNOSIS — I69354 Hemiplegia and hemiparesis following cerebral infarction affecting left non-dominant side: Secondary | ICD-10-CM

## 2018-12-17 NOTE — Patient Instructions (Signed)
Access Code: [email or web address]  URL: [email or web address]  Date: 12/17/2018  Prepared by: Willow Ora   Exercises Sit to Stand without Arm Support - 10 reps - 1 sets - 1x daily - 5x weekly Standing Knee Flexion with Counter Support - 10 reps - 1 sets - 1x daily - 5x weekly Tandem Walking with Counter Support - 2x daily - 7x weekly Side Stepping with Resistance at Thighs and Counter Support - 3 reps - 1 sets - 1x daily - 5x weekly Walking March - 3 reps - 1 sets - 1x daily - 5x weekly Toe Walking with Counter Support - 1 sets - 3 reps - 1x daily - 5x weekly

## 2018-12-18 NOTE — Therapy (Signed)
[facility] [address] [place], [place], [address] Phone: [phone]   Fax:  [phone]  Physical Therapy Treatment  Patient Details  Name: Morgan Boyer MRN: [id number] Date of Birth: 12-11-1931 Referring Provider (PT): Letta Pate Luanna Salk, MD   Encounter Date: 12/17/2018  PT End of Session - 12/17/18 1019    Visit Number  6    Number of Visits  17    Date for PT Re-Evaluation  01/16/19    Authorization Type  [employer], VL: Follow Medicare guidelines    PT Start Time  1017    PT Stop Time  1100    PT Time Calculation (min)  43 min    Equipment Utilized During Treatment  Gait belt    Activity Tolerance  Patient tolerated treatment well    Behavior During Therapy  WFL for tasks assessed/performed       Past Medical History:  Diagnosis Date  . Abnormality of gait   . Adenomatous colon polyp   . Arthritis   . Back pain   . Cancer ([place])    skin cancers  . Depression   . Diverticulosis   . DJD (degenerative joint disease) of knee   . Dyslipidemia   . Esophageal reflux   . H/O hiatal hernia   . Heart murmur   . Herpes zoster    throat  . History of shingles    left throat 07/2007  . Hyperlipidemia   . Hypertension   . Memory changes 01/14/2013  . Neuromuscular disorder ([place])    peripheral neuropathy  . Nocturnal leg cramps 07/15/2014  . Peripheral neuropathy   . Polyneuropathy in other diseases classified elsewhere ([place]) 01/14/2013    Past Surgical History:  Procedure Laterality Date  . ABDOMINAL HYSTERECTOMY    . APPENDECTOMY    . BACK SURGERY  07/2017  . BUBBLE STUDY  10/31/2018   Procedure: BUBBLE STUDY;  Surgeon: Fay Records, MD;  Location: [facility] ENDOSCOPY;  Service: Cardiovascular;;  . COLON SURGERY     2001 diverticulitis with infection ..drained surgically .  [facility] EYE SURGERY     cat ext bil   . JOINT REPLACEMENT     right  . KNEE ARTHROSCOPY  11/08/2011  . LOOP RECORDER INSERTION N/A  10/31/2018   Procedure: LOOP RECORDER INSERTION;  Surgeon: Constance Haw, MD;  Location: [facility] CV LAB;  Service: Cardiovascular;  Laterality: N/A;  . TEE WITHOUT CARDIOVERSION N/A 10/31/2018   Procedure: TRANSESOPHAGEAL ECHOCARDIOGRAM (TEE);  Surgeon: Fay Records, MD;  Location: [facility] ENDOSCOPY;  Service: Cardiovascular;  Laterality: N/A;  . TONSILLECTOMY    . TOTAL KNEE ARTHROPLASTY  11/07/2011   Procedure: TOTAL KNEE ARTHROPLASTY;  Surgeon: Ninetta Lights, MD;  Location: [facility];  Service: Orthopedics;  Laterality: Left;  . TUBAL LIGATION    . WRIST SURGERY     for skin cancer    There were no vitals filed for this visit.  Subjective Assessment - 12/17/18 1019    Subjective  No new complaints. No falls or pain to report.    Pertinent History  R frontal/parietal CVA, L occipital CVA on 10/26/2018, HTN, peripheral neuropathy, HLD, B knee replacements, back surgery 2019    Patient Stated Goals  wants to be able to walk better and wants to feel more stable    Currently in Pain?  No/denies    Pain Score  0-No pain         [facility] PT  Assessment - 12/17/18 1021      Standardized Balance Assessment   Standardized Balance Assessment  Timed Up and Go Test;Five Times Sit to Stand;Berg Balance Test    Five times sit to stand comments   13.13 sec's no UE assist from standard height chair      Berg Balance Test   Sit to Stand  Able to stand without using hands and stabilize independently    Standing Unsupported  Able to stand safely 2 minutes    Sitting with Back Unsupported but Feet Supported on Floor or Stool  Able to sit safely and securely 2 minutes    Stand to Sit  Sits safely with minimal use of hands    Transfers  Able to transfer safely, minor use of hands    Standing Unsupported with Eyes Closed  Able to stand 10 seconds safely    Standing Unsupported with Feet Together  Able to place feet together independently and stand 1 minute safely    From Standing, Reach Forward with  Outstretched Arm  Can reach forward >12 cm safely (5")   8 inches   From Standing Position, Pick up Object from Floor  Able to pick up shoe safely and easily    From Standing Position, Turn to Look Behind Over each Shoulder  Looks behind from both sides and weight shifts well    Turn 360 Degrees  Able to turn 360 degrees safely one side only in 4 seconds or less   right 4 sec's, left 5 sec's   Standing Unsupported, Alternately Place Feet on Step/Stool  Able to complete 4 steps without aid or supervision    Standing Unsupported, One Foot in Front  Able to take small step independently and hold 30 seconds    Standing on One Leg  Tries to lift leg/unable to hold 3 seconds but remains standing independently    Total Score  47      Timed Up and Go Test   Normal TUG (seconds)  10.44   no device, min guard assist      Reviewed and updated HEP. Cues needed on form and technique. Min guard for balance ex's due to being new with no significant balance issues noted.   Access Code: [email or web address]  URL: [email or web address]  Date: 12/17/2018  Prepared by: Willow Ora   Exercises Sit to Stand without Arm Support - 10 reps - 1 sets - 1x daily - 5x weekly Standing Knee Flexion with Counter Support - 10 reps - 1 sets - 1x daily - 5x weekly Tandem Walking with Counter Support - 2x daily - 7x weekly Side Stepping with Resistance at Thighs and Counter Support - 3 reps - 1 sets - 1x daily - 5x weekly Walking March - 3 reps - 1 sets - 1x daily - 5x weekly Toe Walking with Counter Support - 1 sets - 3 reps - 1x daily - 5x weekly      PT Education - 12/17/18 1700    Education Details  updated HEP; progress toward STGs    Person(s) Educated  Patient    Methods  Explanation;Demonstration;Verbal cues;Handout    Comprehension  Verbalized understanding;Returned demonstration;Verbal cues required;Need further instruction       PT Short Term Goals - 12/17/18 1020      PT SHORT TERM GOAL #1    Title  Patient will be independent with initial HEP in order to build upon functional gains in therapy. ALL STGs DUE 12/17/18    Baseline  12/17/18: met with current HEP to this date. Updated/advanced with session today.    Time  --    Period  --    Status  Achieved    Target Date  12/17/18      PT SHORT TERM GOAL #2   Title  Patient will decrease 5 times sit <> stand score from low blue mat table to at least 23 seconds in order to improve functional LE strength.    Baseline  12/17/18: 13.13 sec's without UE assist from standard height surface    Time  --    Period  --    Status  Achieved      PT SHORT TERM GOAL #3   Title  Patient will increase BERG score to at least a 42/56 in order to decrease risk of falls.    Baseline  12/17/18: scored 47/56 today    Time  --    Period  --    Status  Achieved      PT SHORT TERM GOAL #4   Title  Patient will decrease TUG score using LRAD to at least 19 seconds in order to decrease risk of falls.    Baseline  12/17/18: 10.44 sec's no device with min guard assist for safety    Time  --    Period  --    Status  Achieved        PT Long Term Goals - 12/18/18 1325      PT LONG TERM GOAL #1   Title  Patient will be independent with final HEP in order to build upon functional gains made in therapy. ALL LTGs DUE 01/16/19    Time  8    Period  Weeks    Status  On-going      PT LONG TERM GOAL #2   Title  Patient will ascend/descend 2 steps using single handrail on L with supervision in order to safely enter/exit her home.    Baseline  Not yet assessed    Time  8    Period  Weeks    Status  On-going      PT LONG TERM GOAL #3   Title  Patient will increase BERG score to at least a 46/56 in order to decrease risk of falls.    Baseline  12/17/18: 47/56 scored today    Status  Achieved      PT LONG TERM GOAL #4   Title  Patient will decrease TUG score using LRAD to at least 16 seconds in order to decrease risk of falls.    Baseline  12/17/18: 10.44  sec's no AD, with min guard assist    Time  8    Period  Weeks    Status  Partially Met      PT LONG TERM GOAL #5   Title  Patient will decrease 5 times sit <> stand score from low blue mat table to at least 19 seconds in order to improve functional LE strength.    Baseline  12/17/18: 13.13 sec's no UE assist from standard height surface    Status  Achieved      PT LONG TERM GOAL #6   Title  Patient will ambulate at least 250' over level surfaces while scanning environment with mod I in order to improve household and community mobility.    Time  8    Period  Weeks    Status  On-going  Plan - 12/17/18 1020    Clinical Impression Statement  Today's skilled session focused on progress toward STGs with all goals met today. Pt has also met her LTGs for 5 time sit to stand with time of 13.13 sec's no UE support from standard height surface and her Berg Balance Test goal with score of 47/56. She has partially met her Timed Up and Go goal with time of 10.44 sec's with no AD. She needed min guard assist for stability due to veering, therefore marked the goal as partially met. The pt is making steady progress toward goals and should benefit from continued PT to progress toward unmet goals.    Personal Factors and Comorbidities  Age;Comorbidity 3+    Comorbidities  HTN, peripheral neuropathy, HLD    Examination-Activity Limitations  Locomotion Level;Transfers;Stairs    Stability/Clinical Decision Making  Stable/Uncomplicated    Rehab Potential  Good    PT Frequency  2x / week    PT Duration  8 weeks    PT Treatment/Interventions  ADLs/Self Care Home Management;Functional mobility training;Stair training;Gait training;DME Instruction;Therapeutic activities;Therapeutic exercise;Balance training;Neuromuscular re-education;Patient/family education    PT Next Visit Plan  sit to stands. balance exercises on foam and compliant surfaces and with EC. stepping strategy off blue foam. Gait while  scanning environment/ head turns/head nods. NuStep for coordination/LE strength.    PT Home Exercise Plan  [email or web address]    Consulted and Agree with Plan of Care  Patient       Patient will benefit from skilled therapeutic intervention in order to improve the following deficits and impairments:  Abnormal gait, Decreased balance, Decreased coordination, Decreased strength, Difficulty walking, Postural dysfunction  Visit Diagnosis: Hemiplegia and hemiparesis following cerebral infarction affecting left non-dominant side (HCC)  Unsteadiness on feet  Other abnormalities of gait and mobility  Muscle weakness (generalized)     Problem List Patient Active Problem List   Diagnosis Date Noted  . Hemiparesis affecting left side as late effect of stroke ([place])   . Neuropathy   . Acute blood loss anemia   . Hypoalbuminemia due to protein-calorie malnutrition ([place])   . Embolic stroke ([place]) 14/48/1856  . Esophageal stricture 10/30/2018  . Family hx-stroke 10/27/2018  . Advanced age 74/27/2020  . Stroke (cerebrum) (HCC)-R MCA & L MCA infarcts, embolic, source unknown 31/49/7026  . Thrombocytopenia ([place])   . Essential hypertension   . Hyperlipidemia   . Hypokalemia   . Lumbar stenosis with neurogenic claudication 07/03/2017  . Near syncope 11/10/2014  . Nocturnal leg cramps 07/15/2014  . Abnormality of gait 07/15/2014  . Polyneuropathy in other diseases classified elsewhere ([place]) 01/14/2013  . Memory changes 01/14/2013    Willow Ora, PTA, [facility] [address], [place], [place] [address] [phone] 12/18/18, 1:43 PM   Name: Morgan Boyer MRN: [id number] Date of Birth: [birth year]

## 2018-12-19 NOTE — Progress Notes (Signed)
[facility] Summary Report / Loop Recorder 

## 2018-12-22 ENCOUNTER — Encounter: Payer: Self-pay | Admitting: Physician Assistant

## 2018-12-22 NOTE — Progress Notes (Signed)
Cardiology Office Note    Date:  12/25/2018   ID:  Morgan Boyer, Morgan Boyer 1932-01-15, MRN [id number]  PCP:  Leighton Ruff, MD  Cardiologist:  Lauree Chandler, MD  Electrophysiologist:  None   Chief Complaint: f/u stroke  History of Present Illness:   Morgan Boyer is a 85 y.o. female with history of HTN, HLD, mild memory problems, peripheral neuropathy, OA, GERD, depression, known mitral regurgitation (now possibly severe), syncope 2016 (unremarkable event monitor), recent stroke 10/2018, mild chronic LEE who presents for post-hospital f/u.  She has followed with Dr. Angelena Form in the past for mitral regurgitation. Morgan Boyer was admitted on 10/26/2018 with LUE weakness suddenly. She did get tPA. Imagingshowed right MCA and left posterior MCA infarcts - cardioembolic pattern - highly concerning for occult afib. She underwent echo showing EF 60-65%, pseudonormalization, moderate LAE, moderate mitral regurgitation/tricuspid regurgitation, cannot rule out PFO, aneurysmal interatrial septum. There was a possible mobile density in the LAA area so she underwent TEE 10/31/18 showing EF 60-65%, no masses of LAA, no PFO, partially flail leaflet of the mitral valve with severe mitral regurgitation. Do not see further commentary from cardiology team about the severity of her MR. She had a loop recorder implanted by EP. Neurology recommended ASA 81 mg daily and plavix daily for 3 weeks then plavix alone (stop date of ASA 8/21). She went to CIR post d/c. Last labs 11/2018 showed Hgb 11.3, K 3.9, Cr 0.75, albumin 3.1, AST/ALT OK, LDL 84.  She is seen back for follow-up overall doing very well. Her son accompanied her by speakerphone and speaks to a good recovery so far. She's worked with PT and he feels she's actually been stronger post-therapy for her stroke than she was prior to the stroke. She has chronic mild LEE usually at the end of the day but states it's at baseline and doing well today. No CP,  dyspnea, orthopnea, PND, syncope or palpitations. First ILR recording was fine. She indicates she has an 11am appointment she'd like to try to get to on time.   Past Medical History:  Diagnosis Date   Abnormality of gait    Adenomatous colon polyp    Arthritis    Back pain    Cancer (HCC)    skin cancers   Depression    Diverticulosis    DJD (degenerative joint disease) of knee    Esophageal reflux    H/O hiatal hernia    Heart murmur    Herpes zoster    throat   History of shingles    left throat 07/2007   Hyperlipidemia    Hypertension    Memory changes 01/14/2013   Neuromuscular disorder ([place])    peripheral neuropathy   Nocturnal leg cramps 07/15/2014   Peripheral neuropathy    Polyneuropathy in other diseases classified elsewhere ([place]) 01/14/2013   Severe mitral regurgitation    Stroke [facility]cerebrum) [facility])     Past Surgical History:  Procedure Laterality Date   ABDOMINAL HYSTERECTOMY     APPENDECTOMY     BACK SURGERY  07/2017   BUBBLE STUDY  10/31/2018   Procedure: BUBBLE STUDY;  Surgeon: Fay Records, MD;  Location: [facility];  Service: Cardiovascular;;   COLON SURGERY     2001 diverticulitis with infection ..drained surgically .   EYE SURGERY     cat ext bil    JOINT REPLACEMENT     right   KNEE ARTHROSCOPY  11/08/2011   LOOP RECORDER INSERTION N/A 10/31/2018  Procedure: LOOP RECORDER INSERTION;  Surgeon: Constance Haw, MD;  Location: [facility] CV LAB;  Service: Cardiovascular;  Laterality: N/A;   TEE WITHOUT CARDIOVERSION N/A 10/31/2018   Procedure: TRANSESOPHAGEAL ECHOCARDIOGRAM (TEE);  Surgeon: Fay Records, MD;  Location: [facility] ENDOSCOPY;  Service: Cardiovascular;  Laterality: N/A;   TONSILLECTOMY     TOTAL KNEE ARTHROPLASTY  11/07/2011   Procedure: TOTAL KNEE ARTHROPLASTY;  Surgeon: Ninetta Lights, MD;  Location: [facility];  Service: Orthopedics;  Laterality: Left;   TUBAL LIGATION     WRIST SURGERY     for skin  cancer    Current Medications: Current Meds  Medication Sig   acetaminophen (TYLENOL) 325 MG tablet Take 1-2 tablets (325-650 mg total) by mouth every 4 (four) hours as needed for mild pain.   amLODipine (NORVASC) 10 MG tablet Take 1 tablet (10 mg total) by mouth daily.   b complex vitamins tablet Take 1 tablet by mouth daily.   baclofen (LIORESAL) 10 MG tablet Take 1 tablet (10 mg total) by mouth at bedtime.   Calcium Citrate (CITRACAL PO) Take 1 tablet by mouth 2 (two) times daily. + D3   clopidogrel (PLAVIX) 75 MG tablet Take 1 tablet (75 mg total) by mouth daily.   gabapentin (NEURONTIN) 600 MG tablet Take 1 tablet (600 mg total) by mouth at bedtime.   hydrocortisone 2.5 % cream Apply 1 application topically daily as needed (rash).    lisinopril (ZESTRIL) 20 MG tablet Take 1 tablet (20 mg total) by mouth 2 (two) times daily.   pantoprazole (PROTONIX) 40 MG tablet Take 1 tablet (40 mg total) by mouth daily.   Polyethyl Glycol-Propyl Glycol (SYSTANE OP) Place 1 drop into both eyes 3 (three) times daily as needed (for dry eyes).    simvastatin (ZOCOR) 20 MG tablet Take 20 mg by mouth every evening.      Allergies:   Septra [sulfamethoxazole-trimethoprim] and Lotensin [benazepril hcl]   Social History   Socioeconomic History   Marital status: Widowed    Spouse name: Not on file   Number of children: 3   Years of education: hs   Highest education level: Not on file  Occupational History   Occupation: retired  Scientist, product/process development strain: Not hard at [employer] insecurity    Worry: Never true    Inability: Never true   Transportation needs    Medical: No    Non-medical: No  Tobacco Use   Smoking status: Never Smoker   Smokeless tobacco: Never Used  Substance and Sexual Activity   Alcohol use: No   Drug use: No   Sexual activity: Never  Lifestyle   Physical activity    Days per week: Patient refused    Minutes per session: Patient  refused   Stress: Not on file  Relationships   Social connections    Talks on phone: Not on file    Gets together: Twice a week    Attends religious service: Never    Active member of club or organization: No    Attends meetings of clubs or organizations: Never    Relationship status: Widowed  Other Topics Concern   Not on file  Social History Narrative   Husband deceased from lung cancer 13-Jun-2013.   Patient is right handed.   Patient drinks 2 cups of caffeine daily.     Family History:  The patient's family history includes Congestive Heart Failure in her mother; Diabetes in her  brother; Heart attack (age of onset: 28) in her brother; Stroke (age of onset: 76) in her father.  ROS:   Please see the history of present illness. All other systems are reviewed and otherwise negative.    EKGs/Labs/Other Studies Reviewed:    Studies reviewed were summarized above.   EKG:  EKG is ordered today, personally reviewed, demonstrating sinus rhythm with 1st degree AV block, IRBBB, LAFB, nonspecific STT changes  Recent Labs: 11/01/2018: ALT 16 11/05/2018: BUN 21; Creatinine, Ser 0.75; Hemoglobin 11.3; Platelets 234; Potassium 3.9; Sodium 141  Recent Lipid Panel    Component Value Date/Time   CHOL 149 10/26/2018 0541   TRIG 52 10/26/2018 0541   HDL 55 10/26/2018 0541   CHOLHDL 2.7 10/26/2018 0541   VLDL 10 10/26/2018 0541   LDLCALC 84 10/26/2018 0541    PHYSICAL EXAM:    VS:  BP 118/60    Pulse 66    Ht 5\' 2"  (1.575 m)    Wt 143 lb (64.9 kg)    SpO2 99%    BMI 26.16 kg/m   BMI: Body mass index is 26.16 kg/m.  GEN: Well nourished, well developed thin WF, in no acute distress HEENT: normocephalic, atraumatic Neck: no JVD, carotid bruits, or masses Cardiac: RRR; 2/6 SEM heard diffusely over precordium including axilla, trace ankle edema  Respiratory:  clear to auscultation bilaterally, normal work of breathing GI: soft, nontender, nondistended, + BS MS: no deformity or  atrophy Skin: warm and dry, no rash Neuro:  Alert and Oriented x 3, Strength and sensation are intact, follows commands Psych: euthymic mood, full affect  Wt Readings from Last 3 Encounters:  12/25/18 143 lb (64.9 kg)  11/21/18 143 lb (64.9 kg)  11/10/18 149 lb 14.6 oz (68 kg)     ASSESSMENT & PLAN:   1. Severe mitral regurgitation - moderate by echo, but severe by TEE. She is actually quite asymptomatic with this. Given her age and lack of symptomatology she would not really be a candidate for a traditional MVR. I've reached out to Dr. Angelena Form to review and advise - will discuss if she needs to be considered for other intervention (I.e. MitraClip) vs simply monitoring for progression of symptoms. Given her valvular disease she is at risk for development of arrhythmias which will be followed by her loop recorder. 2. Recent stroke - she is followed by neurology, Dr. Jannifer Franklin. We reminded her to touch base with his office about ongoing refills of her Plavix since she is not on this for cardiac reasons. Her ILR is followed remotely by the EP team. 3. Essential HTN - controlled. 4. Hyperlipidemia - this is managed by primary care, on simvastatin. Could consider statin titration given stroke but will defer to their decision. If titration was chosen, would need to be agent other than simvastatin since she is on max recommended dose while on amlodipine.  Disposition: F/u with Dr. Angelena Form in 4 months.  Medication Adjustments/Labs and Tests Ordered: Current medicines are reviewed at length with the patient today.  Concerns regarding medicines are outlined above. Medication changes, Labs and Tests ordered today are summarized above and listed in the Patient Instructions accessible in Encounters.   Signed, Charlie Pitter, PA-C  12/25/2018 10:32 AM    [facility] [place], [place], [place]  [address] Phone: [phone]; Fax: [phone]

## 2018-12-23 ENCOUNTER — Ambulatory Visit: Payer: PPO | Admitting: Physical Therapy

## 2018-12-23 ENCOUNTER — Other Ambulatory Visit: Payer: Self-pay

## 2018-12-23 ENCOUNTER — Encounter: Payer: Self-pay | Admitting: Physical Therapy

## 2018-12-23 DIAGNOSIS — R2681 Unsteadiness on feet: Secondary | ICD-10-CM

## 2018-12-23 DIAGNOSIS — M6281 Muscle weakness (generalized): Secondary | ICD-10-CM

## 2018-12-23 DIAGNOSIS — R278 Other lack of coordination: Secondary | ICD-10-CM

## 2018-12-23 DIAGNOSIS — I69354 Hemiplegia and hemiparesis following cerebral infarction affecting left non-dominant side: Secondary | ICD-10-CM

## 2018-12-23 DIAGNOSIS — R2689 Other abnormalities of gait and mobility: Secondary | ICD-10-CM

## 2018-12-23 NOTE — Therapy (Signed)
[facility] [address] [place], [place], [address] Phone: [phone]   Fax:  [phone]  Physical Therapy Treatment  Patient Details  Name: Morgan Boyer MRN: [id number] Date of Birth: Apr 25, 1931 Referring Provider (PT): Letta Pate Luanna Salk, MD   Encounter Date: 12/23/2018  PT End of Session - 12/23/18 1730    Visit Number  7    Number of Visits  17    Date for PT Re-Evaluation  01/16/19    Authorization Type  [employer], VL: Follow Medicare guidelines    PT Start Time  1100    PT Stop Time  1144    PT Time Calculation (min)  44 min    Equipment Utilized During Treatment  Gait belt    Activity Tolerance  Patient tolerated treatment well    Behavior During Therapy  WFL for tasks assessed/performed       Past Medical History:  Diagnosis Date  . Abnormality of gait   . Adenomatous colon polyp   . Arthritis   . Back pain   . Cancer ([place])    skin cancers  . Depression   . Diverticulosis   . DJD (degenerative joint disease) of knee   . Esophageal reflux   . H/O hiatal hernia   . Heart murmur   . Herpes zoster    throat  . History of shingles    left throat 07/2007  . Hyperlipidemia   . Hypertension   . Memory changes 01/14/2013  . Neuromuscular disorder ([place])    peripheral neuropathy  . Nocturnal leg cramps 07/15/2014  . Peripheral neuropathy   . Polyneuropathy in other diseases classified elsewhere ([place]) 01/14/2013  . Severe mitral regurgitation   . Stroke (cerebrum) [facility])     Past Surgical History:  Procedure Laterality Date  . ABDOMINAL HYSTERECTOMY    . APPENDECTOMY    . BACK SURGERY  07/2017  . BUBBLE STUDY  10/31/2018   Procedure: BUBBLE STUDY;  Surgeon: Fay Records, MD;  Location: [facility] ENDOSCOPY;  Service: Cardiovascular;;  . COLON SURGERY     2001 diverticulitis with infection ..drained surgically .  Marland Kitchen EYE SURGERY     cat ext bil   . JOINT REPLACEMENT     right  . KNEE ARTHROSCOPY   11/08/2011  . LOOP RECORDER INSERTION N/A 10/31/2018   Procedure: LOOP RECORDER INSERTION;  Surgeon: Constance Haw, MD;  Location: [facility] CV LAB;  Service: Cardiovascular;  Laterality: N/A;  . TEE WITHOUT CARDIOVERSION N/A 10/31/2018   Procedure: TRANSESOPHAGEAL ECHOCARDIOGRAM (TEE);  Surgeon: Fay Records, MD;  Location: [facility] ENDOSCOPY;  Service: Cardiovascular;  Laterality: N/A;  . TONSILLECTOMY    . TOTAL KNEE ARTHROPLASTY  11/07/2011   Procedure: TOTAL KNEE ARTHROPLASTY;  Surgeon: Ninetta Lights, MD;  Location: [facility];  Service: Orthopedics;  Laterality: Left;  . TUBAL LIGATION    . WRIST SURGERY     for skin cancer    There were no vitals filed for this visit.  Subjective Assessment - 12/23/18 1103    Subjective  Has been doing the exercises and states that they are really helping. No falls.    Pertinent History  R frontal/parietal CVA, L occipital CVA on 10/26/2018, HTN, peripheral neuropathy, HLD, B knee replacements, back surgery 2019    Patient Stated Goals  wants to be able to walk better and wants to feel more stable  [facility] Adult PT Treatment/Exercise - 12/23/18 1442      High Level Balance   High Level Balance Comments  Standing in // bars with min guard all on blue foam beam: tandem walking forwards 4 reps down and back, side stepping 4 reps down and back, static tandem stance holds 2 reps B. Anterior stepping strategy 2 x 10 reps B, posterior stepping strategy 1 x 10 reps - need for intermittent UE support. On blue and red mats with // bars for intermittent support: marching down and back 2 reps, horizontal head turns down and back 2 reps, vertical head turns down and back 2 reps - with intermittent standing rest breaks, min guard for all balance activities.        Exercises   Other Exercises   NuStep x3 minutes at level 3 LE only - to increase blood flow and circulation, improve ROM and strength                PT Short  Term Goals - 12/17/18 1020      PT SHORT TERM GOAL #1   Title  Patient will be independent with initial HEP in order to build upon functional gains in therapy. ALL STGs DUE 12/17/18    Baseline  12/17/18: met with current HEP to this date. Updated/advanced with session today.    Time  --    Period  --    Status  Achieved    Target Date  12/17/18      PT SHORT TERM GOAL #2   Title  Patient will decrease 5 times sit <> stand score from low blue mat table to at least 23 seconds in order to improve functional LE strength.    Baseline  12/17/18: 13.13 sec's without UE assist from standard height surface    Time  --    Period  --    Status  Achieved      PT SHORT TERM GOAL #3   Title  Patient will increase BERG score to at least a 42/56 in order to decrease risk of falls.    Baseline  12/17/18: scored 47/56 today    Time  --    Period  --    Status  Achieved      PT SHORT TERM GOAL #4   Title  Patient will decrease TUG score using LRAD to at least 19 seconds in order to decrease risk of falls.    Baseline  12/17/18: 10.44 sec's no device with min guard assist for safety    Time  --    Period  --    Status  Achieved        PT Long Term Goals - 12/18/18 1325      PT LONG TERM GOAL #1   Title  Patient will be independent with final HEP in order to build upon functional gains made in therapy. ALL LTGs DUE 01/16/19    Time  8    Period  Weeks    Status  On-going      PT LONG TERM GOAL #2   Title  Patient will ascend/descend 2 steps using single handrail on L with supervision in order to safely enter/exit her home.    Baseline  Not yet assessed    Time  8    Period  Weeks    Status  On-going      PT LONG TERM GOAL #3   Title  Patient will increase BERG score to at least  a 46/56 in order to decrease risk of falls.    Baseline  12/17/18: 47/56 scored today    Status  Achieved      PT LONG TERM GOAL #4   Title  Patient will decrease TUG score using LRAD to at least 16 seconds in  order to decrease risk of falls.    Baseline  12/17/18: 10.44 sec's no AD, with min guard assist    Time  8    Period  Weeks    Status  Partially Met      PT LONG TERM GOAL #5   Title  Patient will decrease 5 times sit <> stand score from low blue mat table to at least 19 seconds in order to improve functional LE strength.    Baseline  12/17/18: 13.13 sec's no UE assist from standard height surface    Status  Achieved      PT LONG TERM GOAL #6   Title  Patient will ambulate at least 250' over level surfaces while scanning environment with mod I in order to improve household and community mobility.    Time  8    Period  Weeks    Status  On-going            Plan - 12/23/18 1730    Clinical Impression Statement  Today's session focused on balance strategies on compliant surfaces - pt tolerated well. Pt with increased difficulty performing anterior and posterior stepping strategies on blue foam without UE support. Intermittent seated rest breaks provided throughout session due to reports of fatigue. Pt needs repeated instructions for tasks and seems to do best with demonstrative visual cueing. Will continue to progress towards LTGs.    Personal Factors and Comorbidities  Age;Comorbidity 3+    Comorbidities  HTN, peripheral neuropathy, HLD    Examination-Activity Limitations  Locomotion Level;Transfers;Stairs    Stability/Clinical Decision Making  Stable/Uncomplicated    Rehab Potential  Good    PT Frequency  2x / week    PT Duration  8 weeks    PT Treatment/Interventions  ADLs/Self Care Home Management;Functional mobility training;Stair training;Gait training;DME Instruction;Therapeutic activities;Therapeutic exercise;Balance training;Neuromuscular re-education;Patient/family education    PT Next Visit Plan  Add corner balance exercises to HEP. Perform stairs. Gait outdoors. sit <> stands on foam without UE. SLS balance exercises on foam and compliant surfaces and with EC.  Gait while  scanning environment/ head turns/head nods (finding cones)    PT Home Exercise Plan  [id number]    Consulted and Agree with Plan of Care  Patient       Patient will benefit from skilled therapeutic intervention in order to improve the following deficits and impairments:  Abnormal gait, Decreased balance, Decreased coordination, Decreased strength, Difficulty walking, Postural dysfunction  Visit Diagnosis: Hemiplegia and hemiparesis following cerebral infarction affecting left non-dominant side (HCC)  Unsteadiness on feet  Other abnormalities of gait and mobility  Muscle weakness (generalized)  Other lack of coordination     Problem List Patient Active Problem List   Diagnosis Date Noted  . Hemiparesis affecting left side as late effect of stroke ([place])   . Neuropathy   . Acute blood loss anemia   . Hypoalbuminemia due to protein-calorie malnutrition ([place])   . Embolic stroke ([place]) 54/12/8117  . Esophageal stricture 10/30/2018  . Family hx-stroke 10/27/2018  . Advanced age 29/27/2020  . 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 Thrombocytopenia ([place])   . Essential hypertension   .  Hyperlipidemia   . Hypokalemia   . Lumbar stenosis with neurogenic claudication 07/03/2017  . Near syncope 11/10/2014  . Nocturnal leg cramps 07/15/2014  . Abnormality of gait 07/15/2014  . Polyneuropathy in other diseases classified elsewhere ([place]) 01/14/2013  . Memory changes 01/14/2013    Arliss Journey, PT, DPT 12/23/2018, 5:34 PM  [facility] [address] [place], [place], [address] Phone: [phone]   Fax:  [phone]  Name: Morgan Boyer MRN: [id number] Date of Birth: 01-Aug-1931

## 2018-12-24 ENCOUNTER — Telehealth: Payer: Self-pay | Admitting: Physician Assistant

## 2018-12-24 NOTE — Telephone Encounter (Signed)
New Message;     Pt's son called. He wanted you to know that he will come with pt tomorrow for her appointment with Melina Copa. He needs to be with pt, she just recently had a stroke.

## 2018-12-25 ENCOUNTER — Encounter: Payer: Self-pay | Admitting: Physician Assistant

## 2018-12-25 ENCOUNTER — Other Ambulatory Visit: Payer: Self-pay

## 2018-12-25 ENCOUNTER — Telehealth: Payer: Self-pay | Admitting: Physician Assistant

## 2018-12-25 ENCOUNTER — Ambulatory Visit (INDEPENDENT_AMBULATORY_CARE_PROVIDER_SITE_OTHER): Payer: PPO | Admitting: Physician Assistant

## 2018-12-25 ENCOUNTER — Encounter: Payer: Self-pay | Admitting: Physical Therapy

## 2018-12-25 ENCOUNTER — Ambulatory Visit: Payer: PPO | Admitting: Physical Therapy

## 2018-12-25 VITALS — BP 118/60 | HR 66 | Ht 62.0 in | Wt 143.0 lb

## 2018-12-25 DIAGNOSIS — E785 Hyperlipidemia, unspecified: Secondary | ICD-10-CM

## 2018-12-25 DIAGNOSIS — R278 Other lack of coordination: Secondary | ICD-10-CM

## 2018-12-25 DIAGNOSIS — I340 Nonrheumatic mitral (valve) insufficiency: Secondary | ICD-10-CM | POA: Diagnosis not present

## 2018-12-25 DIAGNOSIS — I10 Essential (primary) hypertension: Secondary | ICD-10-CM

## 2018-12-25 DIAGNOSIS — M6281 Muscle weakness (generalized): Secondary | ICD-10-CM

## 2018-12-25 DIAGNOSIS — R2681 Unsteadiness on feet: Secondary | ICD-10-CM

## 2018-12-25 DIAGNOSIS — Z8673 Personal history of transient ischemic attack (TIA), and cerebral infarction without residual deficits: Secondary | ICD-10-CM | POA: Diagnosis not present

## 2018-12-25 DIAGNOSIS — I69354 Hemiplegia and hemiparesis following cerebral infarction affecting left non-dominant side: Secondary | ICD-10-CM | POA: Diagnosis not present

## 2018-12-25 DIAGNOSIS — R2689 Other abnormalities of gait and mobility: Secondary | ICD-10-CM

## 2018-12-25 NOTE — Telephone Encounter (Signed)
Follow up   Patient's son [name] is returning your call. Please call.

## 2018-12-25 NOTE — Telephone Encounter (Signed)
   Please let patient know that I reviewed case with Dr. Angelena Form - given her absence of heart symptoms he does not feel we need to do anything about the mitral valve leaking at this time, but let us know if she begins to have any chest pain, SOB, signs of worsening heart failure/fluid retention - otherwise f/u as planned. Jermichael Belmares PA-C

## 2018-12-25 NOTE — Telephone Encounter (Signed)
Call placed to [name], [place] on file, re: message below. Left a message for him to call back.

## 2018-12-25 NOTE — Telephone Encounter (Signed)
Returned call to pt's son, [name], [place] on file. He has been made aware of Melina Copa, PA-C's recommendations below.

## 2018-12-25 NOTE — Patient Instructions (Signed)
Medication Instructions:  Your physician recommends that you continue on your current medications as directed. Please refer to the Current Medication list given to you today.  If you need a refill on your cardiac medications before your next appointment, please call your pharmacy.   Lab work: None ordered  If you have labs (blood work) drawn today and your tests are completely normal, you will receive your results only by: Marland Kitchen MyChart Message (if you have MyChart) OR . A paper copy in the mail If you have any lab test that is abnormal or we need to change your treatment, we will call you to review the results.  Testing/Procedures: None ordered  Follow-Up: At [facility], you and your health needs are our priority.  As part of our continuing mission to provide you with exceptional heart care, we have created designated Provider Care Teams.  These Care Teams include your primary Cardiologist (physician) and Advanced Practice Providers (APPs -  Physician Assistants and Nurse Practitioners) who all work together to provide you with the care you need, when you need it. You will need a follow up appointment in 4 months.  Please call our office 2 months in advance to schedule this appointment.  You may see Lauree Chandler, MD or one of the following Advanced Practice Providers on your designated Care Team:   [place], PA-C Melina Copa, PA-C . Ermalinda Barrios, PA-C  Any Other Special Instructions Will Be Listed Below (If Applicable). Please talk to your primary physician or your neurologist about Plavix refills.

## 2018-12-25 NOTE — Therapy (Signed)
[facility] [address] [place], [place], [address] Phone: [phone]   Fax:  [phone]  Physical Therapy Treatment  Patient Details  Name: Morgan Boyer MRN: [id number] Date of Birth: 1931-09-24 Referring Provider (PT): Letta Pate Luanna Salk, MD   Encounter Date: 12/25/2018  PT End of Session - 12/25/18 1156    Visit Number  8    Number of Visits  17    Date for PT Re-Evaluation  01/16/19    Authorization Type  [employer], VL: Follow Medicare guidelines    PT Start Time  1102    PT Stop Time  1145    PT Time Calculation (min)  43 min    Equipment Utilized During Treatment  Gait belt    Activity Tolerance  Patient tolerated treatment well    Behavior During Therapy  WFL for tasks assessed/performed       Past Medical History:  Diagnosis Date  . Abnormality of gait   . Adenomatous colon polyp   . Arthritis   . Back pain   . Cancer ([place])    skin cancers  . Depression   . Diverticulosis   . DJD (degenerative joint disease) of knee   . Esophageal reflux   . H/O hiatal hernia   . Heart murmur   . Herpes zoster    throat  . History of shingles    left throat 07/2007  . Hyperlipidemia   . Hypertension   . Memory changes 01/14/2013  . Neuromuscular disorder ([place])    peripheral neuropathy  . Nocturnal leg cramps 07/15/2014  . Peripheral neuropathy   . Polyneuropathy in other diseases classified elsewhere ([place]) 01/14/2013  . Severe mitral regurgitation   . Stroke (cerebrum) [facility])     Past Surgical History:  Procedure Laterality Date  . ABDOMINAL HYSTERECTOMY    . APPENDECTOMY    . BACK SURGERY  07/2017  . BUBBLE STUDY  10/31/2018   Procedure: BUBBLE STUDY;  Surgeon: Fay Records, MD;  Location: [facility] ENDOSCOPY;  Service: Cardiovascular;;  . COLON SURGERY     2001 diverticulitis with infection ..drained surgically .  Marland Kitchen EYE SURGERY     cat ext bil   . JOINT REPLACEMENT     right  . KNEE ARTHROSCOPY   11/08/2011  . LOOP RECORDER INSERTION N/A 10/31/2018   Procedure: LOOP RECORDER INSERTION;  Surgeon: Constance Haw, MD;  Location: [facility] CV LAB;  Service: Cardiovascular;  Laterality: N/A;  . TEE WITHOUT CARDIOVERSION N/A 10/31/2018   Procedure: TRANSESOPHAGEAL ECHOCARDIOGRAM (TEE);  Surgeon: Fay Records, MD;  Location[facility] ENDOSCOPY;  Service: Cardiovascular;  Laterality: N/A;  . TONSILLECTOMY    . TOTAL KNEE ARTHROPLASTY  11/07/2011   Procedure: TOTAL KNEE ARTHROPLASTY;  Surgeon: Ninetta Lights, MD;  Location: [facility];  Service: Orthopedics;  Laterality: Left;  . TUBAL LIGATION    . WRIST SURGERY     for skin cancer    There were no vitals filed for this visit.  Subjective Assessment - 12/25/18 1105    Subjective  Went to the cardiologist this morning - it went well. No falls - is very careful. Has been doing her exercises everyday. Notices that she is off balance initially when she stands up.    Pertinent History  R frontal/parietal CVA, L occipital CVA on 10/26/2018, HTN, peripheral neuropathy, HLD, B knee replacements, back surgery 2019    Patient Stated Goals  wants to be able to walk better  and wants to feel more stable                       [facility] Adult PT Treatment/Exercise - 12/25/18 1118      Transfers   Transfers  Sit to Stand;Stand to Sit    Sit to Stand  5: Supervision    Sit to Stand Details  Verbal cues for technique;Verbal cues for sequencing;Visual cues/gestures for sequencing    Sit to Stand Details (indicate cue type and reason)  Standing on red balance beam with wider BOS sit <> stands with holding balance in standing, focus on eccentric control while lowering with minimal use of UE. pt using BUE support to push off from mat table      Ambulation/Gait   Ambulation/Gait  Yes    Ambulation/Gait Assistance  5: Supervision    Ambulation/Gait Assistance Details  Ambulated around the gym scanning the environment looking for colored cones (needing  to look up/down and right/left) - pt able to complete after 2.5 laps around, needed max cues on where to look to find remaining 2 cones. Cues to maintain gait speed. Ambulated while tossing and catching ball - no LOB, slows gait speed in order to maintain coordination and balance.    Ambulation Distance (Feet)  450 Feet    Assistive device  None    Gait Pattern  Step-through pattern;Decreased arm swing - left;Decreased weight shift to left;Decreased step length - right;Decreased stance time - left;Decreased trunk rotation;Trunk flexed;Wide base of support    Ambulation Surface  Level;Indoor          Balance Exercises - 12/25/18 1154      Balance Exercises: Standing   Wall Bumps  Hip    Wall Bumps-Hips  Eyes opened;Anterior/posterior   2 x 10 reps   Rockerboard  Anterior/posterior;EC;Intermittent UE support   A/P 15 reps, EC multiple reps x5 seconds   Step Over Hurdles / Cones  At countertop with single UE support and on blue mat for compliant surface: stepping over black balance beam on lowest height 2 x 5 reps B - cues for increased hip and knee flexion          PT Short Term Goals - 12/17/18 1020      PT SHORT TERM GOAL #1   Title  Patient will be independent with initial HEP in order to build upon functional gains in therapy. ALL STGs DUE 12/17/18    Baseline  12/17/18: met with current HEP to this date. Updated/advanced with session today.    Time  --    Period  --    Status  Achieved    Target Date  12/17/18      PT SHORT TERM GOAL #2   Title  Patient will decrease 5 times sit <> stand score from low blue mat table to at least 23 seconds in order to improve functional LE strength.    Baseline  12/17/18: 13.13 sec's without UE assist from standard height surface    Time  --    Period  --    Status  Achieved      PT SHORT TERM GOAL #3   Title  Patient will increase BERG score to at least a 42/56 in order to decrease risk of falls.    Baseline  12/17/18: scored 47/56 today     Time  --    Period  --    Status  Achieved      PT  SHORT TERM GOAL #4   Title  Patient will decrease TUG score using LRAD to at least 19 seconds in order to decrease risk of falls.    Baseline  12/17/18: 10.44 sec's no device with min guard assist for safety    Time  --    Period  --    Status  Achieved        PT Long Term Goals - 12/23/18 1734      PT LONG TERM GOAL #1   Title  Patient will be independent with final HEP in order to build upon functional gains made in therapy. ALL LTGs DUE 01/16/19    Time  8    Period  Weeks    Status  On-going      PT LONG TERM GOAL #2   Title  Patient will ascend/descend 2 steps using single handrail on L with supervision in order to safely enter/exit her home.    Baseline  Not yet assessed    Time  8    Period  Weeks    Status  On-going      PT LONG TERM GOAL #3   Title  Patient will increase BERG score to at least a 50/56 in order to decrease risk of falls.    Baseline  12/17/18: 47/56 scored today    Status  Revised      PT LONG TERM GOAL #4   Title  Patient will decrease TUG score using LRAD to at least 16 seconds in order to decrease risk of falls.    Baseline  12/17/18: 10.44 sec's no AD, with min guard assist    Time  8    Period  Weeks    Status  Partially Met      PT LONG TERM GOAL #5   Title  Patient will decrease 5 times sit <> stand score from low blue mat table to at least 19 seconds in order to improve functional LE strength.    Baseline  12/17/18: 13.13 sec's no UE assist from standard height surface    Status  Achieved      PT LONG TERM GOAL #6   Title  Patient will ambulate at least 250' over level surfaces while scanning environment with mod I in order to improve household and community mobility.    Time  8    Period  Weeks    Status  On-going            Plan - 12/25/18 1158    Clinical Impression Statement  Pt is progressing well with PT. Per son (from cardiologist's note) - pt is looking better balance  and strength wise then she did before her CVA. Intermittent seated breaks taken due to fatigue. Pt demonstrates slower gait speed when dual tasking and scanning environment with gait and no overt LOB. Pt will continue to benefit from skilled PT to progress towards LTGs.    Personal Factors and Comorbidities  Age;Comorbidity 3+    Comorbidities  HTN, peripheral neuropathy, HLD    Examination-Activity Limitations  Locomotion Level;Transfers;Stairs    Stability/Clinical Decision Making  Stable/Uncomplicated    Rehab Potential  Good    PT Frequency  2x / week    PT Duration  8 weeks    PT Treatment/Interventions  ADLs/Self Care Home Management;Functional mobility training;Stair training;Gait training;DME Instruction;Therapeutic activities;Therapeutic exercise;Balance training;Neuromuscular re-education;Patient/family education    PT Next Visit Plan  Add corner balance exercises to HEP. Step ups -forward and lateral. Gait outdoors.  sit <> stands on foam without UE. SLS balance exercises on foam and compliant surfaces and with EC.  Gait while scanning environment/ head turns/head nods (finding cones)    PT Home Exercise Plan  [id number]    Consulted and Agree with Plan of Care  Patient       Patient will benefit from skilled therapeutic intervention in order to improve the following deficits and impairments:  Abnormal gait, Decreased balance, Decreased coordination, Decreased strength, Difficulty walking, Postural dysfunction  Visit Diagnosis: Unsteadiness on feet  Other abnormalities of gait and mobility  Muscle weakness (generalized)  Other lack of coordination     Problem List Patient Active Problem List   Diagnosis Date Noted  . Hemiparesis affecting left side as late effect of stroke ([place])   . Neuropathy   . Acute blood loss anemia   . Hypoalbuminemia due to protein-calorie malnutrition ([place])   . Embolic stroke ([place]) 28/36/6294  . Esophageal stricture 10/30/2018  . Family hx-stroke  10/27/2018  . Advanced age 75/27/2020  . 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 Thrombocytopenia ([place])   . Essential hypertension   . Hyperlipidemia   . Hypokalemia   . Lumbar stenosis with neurogenic claudication 07/03/2017  . Near syncope 11/10/2014  . Nocturnal leg cramps 07/15/2014  . Abnormality of gait 07/15/2014  . Polyneuropathy in other diseases classified elsewhere ([place]) 01/14/2013  . Memory changes 01/14/2013    Lillia Pauls, DPT 12/25/2018, 11:59 AM  [facility] [address] [place], [place], [address] Phone: [phone]   Fax:  [phone]  Name: Morgan Boyer MRN: [id number] Date of Birth: 11/08/1931

## 2018-12-30 ENCOUNTER — Encounter: Payer: Self-pay | Admitting: Physical Therapy

## 2018-12-30 ENCOUNTER — Other Ambulatory Visit: Payer: Self-pay

## 2018-12-30 ENCOUNTER — Ambulatory Visit: Payer: PPO | Admitting: Physical Therapy

## 2018-12-30 DIAGNOSIS — R2689 Other abnormalities of gait and mobility: Secondary | ICD-10-CM

## 2018-12-30 DIAGNOSIS — I69354 Hemiplegia and hemiparesis following cerebral infarction affecting left non-dominant side: Secondary | ICD-10-CM | POA: Diagnosis not present

## 2018-12-30 DIAGNOSIS — M6281 Muscle weakness (generalized): Secondary | ICD-10-CM

## 2018-12-30 DIAGNOSIS — R2681 Unsteadiness on feet: Secondary | ICD-10-CM

## 2018-12-30 DIAGNOSIS — R278 Other lack of coordination: Secondary | ICD-10-CM

## 2018-12-30 NOTE — Patient Instructions (Signed)
Access Code: [email or web address]  URL: [email or web address]  Date: 12/30/2018  Prepared by: Janann August   Exercises Sit to Stand without Arm Support - 10 reps - 1 sets - 1x daily - 5x weekly Standing Knee Flexion with Counter Support - 10 reps - 1 sets - 1x daily - 5x weekly Tandem Walking with Counter Support - 2x daily - 7x weekly Side Stepping with Resistance at Thighs and Counter Support - 3 reps - 1 sets - 1x daily - 5x weekly Walking March - 3 reps - 1 sets - 1x daily - 5x weekly Toe Walking with Counter Support - 1 sets - 3 reps - 1x daily - 5x weekly Heel Walking - 3 sets - 2x daily - 5x weekly Romberg Stance on Foam Pad - 5 reps - 3 sets - 2x daily - 7x weekly Standing Balance with Eyes Closed on Foam - 3 reps - 20 hold - 1x daily - 7x weekly

## 2018-12-30 NOTE — Therapy (Signed)
[facility] [address] [place], [place], [address] Phone: [phone]   Fax:  [phone]  Physical Therapy Treatment  Patient Details  Name: Morgan Boyer MRN: [id number] Date of Birth: July 10, 1931 Referring Provider (PT): Letta Pate Luanna Salk, MD   Encounter Date: 12/30/2018  PT End of Session - 12/30/18 1154    Visit Number  9    Number of Visits  17    Date for PT Re-Evaluation  01/16/19    Authorization Type  [employer], VL: Follow Medicare guidelines    PT Start Time  1102    PT Stop Time  1145    PT Time Calculation (min)  43 min    Equipment Utilized During Treatment  Gait belt    Activity Tolerance  Patient tolerated treatment well    Behavior During Therapy  WFL for tasks assessed/performed       Past Medical History:  Diagnosis Date  . Abnormality of gait   . Adenomatous colon polyp   . Arthritis   . Back pain   . Cancer ([place])    skin cancers  . Depression   . Diverticulosis   . DJD (degenerative joint disease) of knee   . Esophageal reflux   . H/O hiatal hernia   . Heart murmur   . Herpes zoster    throat  . History of shingles    left throat 07/2007  . Hyperlipidemia   . Hypertension   . Memory changes 01/14/2013  . Neuromuscular disorder ([place])    peripheral neuropathy  . Nocturnal leg cramps 07/15/2014  . Peripheral neuropathy   . Polyneuropathy in other diseases classified elsewhere ([place]) 01/14/2013  . Severe mitral regurgitation   . Stroke (cerebrum) [facility])     Past Surgical History:  Procedure Laterality Date  . ABDOMINAL HYSTERECTOMY    . APPENDECTOMY    . BACK SURGERY  07/2017  . BUBBLE STUDY  10/31/2018   Procedure: BUBBLE STUDY;  Surgeon: Fay Records, MD;  Location: [facility] ENDOSCOPY;  Service: Cardiovascular;;  . COLON SURGERY     2001 diverticulitis with infection ..drained surgically .  Marland Kitchen EYE SURGERY     cat ext bil   . JOINT REPLACEMENT     right  . KNEE ARTHROSCOPY   11/08/2011  . LOOP RECORDER INSERTION N/A 10/31/2018   Procedure: LOOP RECORDER INSERTION;  Surgeon: Constance Haw, MD;  Location: [facility] CV LAB;  Service: Cardiovascular;  Laterality: N/A;  . TEE WITHOUT CARDIOVERSION N/A 10/31/2018   Procedure: TRANSESOPHAGEAL ECHOCARDIOGRAM (TEE);  Surgeon: Fay Records, MD;  Location: [facility] ENDOSCOPY;  Service: Cardiovascular;  Laterality: N/A;  . TONSILLECTOMY    . TOTAL KNEE ARTHROPLASTY  11/07/2011   Procedure: TOTAL KNEE ARTHROPLASTY;  Surgeon: Ninetta Lights, MD;  Location: [facility];  Service: Orthopedics;  Laterality: Left;  . TUBAL LIGATION    . WRIST SURGERY     for skin cancer    There were no vitals filed for this visit.  Subjective Assessment - 12/30/18 1108    Subjective  Reports her stomach isn't feeling the best this morning, but still feels good to participate in therapy. Is feeling good about her balance and walking.    Pertinent History  R frontal/parietal CVA, L occipital CVA on 10/26/2018, HTN, peripheral neuropathy, HLD, B knee replacements, back surgery 2019    Patient Stated Goals  wants to be able to walk better and wants to feel more stable  Balance Exercises - 12/30/18 1210      Balance Exercises: Standing   Standing Eyes Closed  Narrow base of support (BOS);Foam/compliant surface;3 reps;20 secs   progressing from wider BOS - narrow   Tandem Stance  Eyes open;20 secs;Foam/compliant surface;3 reps   modified tandem, B, 5 reps head turns/nods   Other Standing Exercises  At countertop with blue/red mat on floor for compliant surface down and back, single UE/fingertip support, min guard: tandem walking 3 reps, toe walking 4 reps, side stepping 3 reps (no UE support, cues for larger step length).  Added cognitive task throughout with pt conversing with therapist. Standing on red mat multiple reps SLS toe taps onto 2 floor bubbles (forward tap and then cross tap), performed B - no UE  support.          Access Code: [email or web address]  URL: [email or web address]  Date: 12/30/2018  Prepared by: Janann August   Exercises Sit to Stand without Arm Support - 10 reps - 1 sets - 1x daily - 5x weekly Standing Knee Flexion with Counter Support - 10 reps - 1 sets - 1x daily - 5x weekly Tandem Walking with Counter Support - 2x daily - 7x weekly Side Stepping with Resistance at Thighs and Counter Support - 3 reps - 1 sets - 1x daily - 5x weekly Walking March - 3 reps - 1 sets - 1x daily - 5x weekly Toe Walking with Counter Support - 1 sets - 3 reps - 1x daily - 5x weekly Heel Walking - 3 sets - 2x daily - 5x weekly - new addition  Romberg Stance on Foam Pad - 5 reps - 3 sets - 2x daily - 7x weekly - new addition, 5 reps head turns and head nods  Standing Balance with Eyes Closed on Foam - 3 reps - 20 hold - 1x daily - 7x weekly - new addition, feet apart eyes closed   PT Education - 12/30/18 1153    Education Details  HEP additions, use of visual, somatosensory, and vestibular system in balance and what system each exercise is targeting.    Person(s) Educated  Patient    Methods  Explanation;Demonstration;Handout    Comprehension  Verbalized understanding;Returned demonstration       PT Short Term Goals - 12/17/18 1020      PT SHORT TERM GOAL #1   Title  Patient will be independent with initial HEP in order to build upon functional gains in therapy. ALL STGs DUE 12/17/18    Baseline  12/17/18: met with current HEP to this date. Updated/advanced with session today.    Time  --    Period  --    Status  Achieved    Target Date  12/17/18      PT SHORT TERM GOAL #2   Title  Patient will decrease 5 times sit <> stand score from low blue mat table to at least 23 seconds in order to improve functional LE strength.    Baseline  12/17/18: 13.13 sec's without UE assist from standard height surface    Time  --    Period  --    Status  Achieved      PT SHORT TERM GOAL #3    Title  Patient will increase BERG score to at least a 42/56 in order to decrease risk of falls.    Baseline  12/17/18: scored 47/56 today    Time  --    Period  --    Status  Achieved      PT SHORT TERM GOAL #4   Title  Patient will decrease TUG score using LRAD to at least 19 seconds in order to decrease risk of falls.    Baseline  12/17/18: 10.44 sec's no device with min guard assist for safety    Time  --    Period  --    Status  Achieved        PT Long Term Goals - 12/23/18 1734      PT LONG TERM GOAL #1   Title  Patient will be independent with final HEP in order to build upon functional gains made in therapy. ALL LTGs DUE 01/16/19    Time  8    Period  Weeks    Status  On-going      PT LONG TERM GOAL #2   Title  Patient will ascend/descend 2 steps using single handrail on L with supervision in order to safely enter/exit her home.    Baseline  Not yet assessed    Time  8    Period  Weeks    Status  On-going      PT LONG TERM GOAL #3   Title  Patient will increase BERG score to at least a 50/56 in order to decrease risk of falls.    Baseline  12/17/18: 47/56 scored today    Status  Revised      PT LONG TERM GOAL #4   Title  Patient will decrease TUG score using LRAD to at least 16 seconds in order to decrease risk of falls.    Baseline  12/17/18: 10.44 sec's no AD, with min guard assist    Time  8    Period  Weeks    Status  Partially Met      PT LONG TERM GOAL #5   Title  Patient will decrease 5 times sit <> stand score from low blue mat table to at least 19 seconds in order to improve functional LE strength.    Baseline  12/17/18: 13.13 sec's no UE assist from standard height surface    Status  Achieved      PT LONG TERM GOAL #6   Title  Patient will ambulate at least 250' over level surfaces while scanning environment with mod I in order to improve household and community mobility.    Time  8    Period  Weeks    Status  On-going            Plan -  12/30/18 1207    Clinical Impression Statement  Focus of today's session was adding exercises to HEP for dynamic and standing balance - added heel walking at countertop, romberg stance eyes open with head nods/turns on pillow, and wider BOS on pillow with eyes closed. Pt able to demonstrate all exercises correctly. Pt has increased postural sway when eyes are closed and a more narrow BOS on compliant surfaces, requiring min guard. Additional focus of session was dynamic balance on blue/red mats with single UE support. Will continue to progress towards LTGs.    Personal Factors and Comorbidities  Age;Comorbidity 3+    Comorbidities  HTN, peripheral neuropathy, HLD    Examination-Activity Limitations  Locomotion Level;Transfers;Stairs    Stability/Clinical Decision Making  Stable/Uncomplicated    Rehab Potential  Good    PT Frequency  2x / week    PT Duration  8 weeks    PT Treatment/Interventions  ADLs/Self Care Home Management;Functional mobility training;Stair  training;Gait training;DME Instruction;Therapeutic activities;Therapeutic exercise;Balance training;Neuromuscular re-education;Patient/family education    PT Next Visit Plan  10TH VISIT PROGRESS NOTE. Review HEP? continue balance with eyes closed on compliant surfaces for vestibular input. Step ups -forward and lateral. Gait outdoors. sit <> stands on foam without UE. SLS balance exercises on foam and compliant surfaces and with EC.  Scanning environment activities.    PT Home Exercise Plan  [id number]    Consulted and Agree with Plan of Care  Patient       Patient will benefit from skilled therapeutic intervention in order to improve the following deficits and impairments:  Abnormal gait, Decreased balance, Decreased coordination, Decreased strength, Difficulty walking, Postural dysfunction  Visit Diagnosis: Unsteadiness on feet  Other abnormalities of gait and mobility  Muscle weakness (generalized)  Other lack of  coordination  Hemiplegia and hemiparesis following cerebral infarction affecting left non-dominant side [facility])     Problem List Patient Active Problem List   Diagnosis Date Noted  . Hemiparesis affecting left side as late effect of stroke ([place])   . Neuropathy   . Acute blood loss anemia   . Hypoalbuminemia due to protein-calorie malnutrition ([place])   . Embolic stroke ([place]) 62/26/3335  . Esophageal stricture 10/30/2018  . Family hx-stroke 10/27/2018  . Advanced age 53/27/2020  . 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 Thrombocytopenia ([place])   . Essential hypertension   . Hyperlipidemia   . Hypokalemia   . Lumbar stenosis with neurogenic claudication 07/03/2017  . Near syncope 11/10/2014  . Nocturnal leg cramps 07/15/2014  . Abnormality of gait 07/15/2014  . Polyneuropathy in other diseases classified elsewhere ([place]) 01/14/2013  . Memory changes 01/14/2013    Arliss Journey, PT, DPT  12/30/2018, 12:15 PM  [facility] [address] [place], [place], [address] Phone: [phone]   Fax:  [phone]  Name: Morgan Boyer MRN: [id number] Date of Birth: September 19, 1931

## 2019-01-01 ENCOUNTER — Ambulatory Visit: Payer: PPO | Attending: Physical Medicine and Rehabilitation | Admitting: Physical Therapy

## 2019-01-01 ENCOUNTER — Other Ambulatory Visit: Payer: Self-pay

## 2019-01-01 ENCOUNTER — Encounter: Payer: Self-pay | Admitting: Physical Therapy

## 2019-01-01 DIAGNOSIS — R2681 Unsteadiness on feet: Secondary | ICD-10-CM | POA: Diagnosis not present

## 2019-01-01 DIAGNOSIS — I69354 Hemiplegia and hemiparesis following cerebral infarction affecting left non-dominant side: Secondary | ICD-10-CM

## 2019-01-01 DIAGNOSIS — M6281 Muscle weakness (generalized): Secondary | ICD-10-CM

## 2019-01-01 DIAGNOSIS — R2689 Other abnormalities of gait and mobility: Secondary | ICD-10-CM

## 2019-01-01 DIAGNOSIS — R278 Other lack of coordination: Secondary | ICD-10-CM | POA: Diagnosis not present

## 2019-01-01 DIAGNOSIS — R41841 Cognitive communication deficit: Secondary | ICD-10-CM | POA: Insufficient documentation

## 2019-01-01 NOTE — Therapy (Signed)
[facility] [address] [place], [place], [address] Phone: [phone]   Fax:  [phone]  Physical Therapy Treatment/ 10th visit progress note   Patient Details  Name: Morgan Boyer MRN: [id number] Date of Birth: [birth year] Referring Provider (PT): Charlett Blake, MD  10th Visit Physical Therapy Progress Note  Dates of Reporting Period: 11/17/18 to 01/01/19    Encounter Date: 01/01/2019  PT End of Session - 01/01/19 1221    Visit Number  10    Number of Visits  17    Date for PT Re-Evaluation  01/16/19    Authorization Type  [employer], VL: Follow Medicare guidelines    PT Start Time  1109    PT Stop Time  1150    PT Time Calculation (min)  41 min    Equipment Utilized During Treatment  Gait belt    Activity Tolerance  Patient tolerated treatment well    Behavior During Therapy  WFL for tasks assessed/performed       Past Medical History:  Diagnosis Date  . Abnormality of gait   . Adenomatous colon polyp   . Arthritis   . Back pain   . Cancer ([place])    skin cancers  . Depression   . Diverticulosis   . DJD (degenerative joint disease) of knee   . Esophageal reflux   . H/O hiatal hernia   . Heart murmur   . Herpes zoster    throat  . History of shingles    left throat 07/2007  . Hyperlipidemia   . Hypertension   . Memory changes 01/14/2013  . Neuromuscular disorder ([place])    peripheral neuropathy  . Nocturnal leg cramps 07/15/2014  . Peripheral neuropathy   . Polyneuropathy in other diseases classified elsewhere ([place]) 01/14/2013  . Severe mitral regurgitation   . Stroke (cerebrum) [facility])     Past Surgical History:  Procedure Laterality Date  . ABDOMINAL HYSTERECTOMY    . APPENDECTOMY    . BACK SURGERY  07/2017  . BUBBLE STUDY  10/31/2018   Procedure: BUBBLE STUDY;  Surgeon: Fay Records, MD;  Location: [facility] ENDOSCOPY;  Service: Cardiovascular;;  . COLON SURGERY     2001 diverticulitis  with infection ..drained surgically .  Marland Kitchen EYE SURGERY     cat ext bil   . JOINT REPLACEMENT     right  . KNEE ARTHROSCOPY  11/08/2011  . LOOP RECORDER INSERTION N/A 10/31/2018   Procedure: LOOP RECORDER INSERTION;  Surgeon: Constance Haw, MD;  Location: [facility] CV LAB;  Service: Cardiovascular;  Laterality: N/A;  . TEE WITHOUT CARDIOVERSION N/A 10/31/2018   Procedure: TRANSESOPHAGEAL ECHOCARDIOGRAM (TEE);  Surgeon: Fay Records, MD;  Location[facility] ENDOSCOPY;  Service: Cardiovascular;  Laterality: N/A;  . TONSILLECTOMY    . TOTAL KNEE ARTHROPLASTY  11/07/2011   Procedure: TOTAL KNEE ARTHROPLASTY;  Surgeon: Ninetta Lights, MD;  Location: [facility];  Service: Orthopedics;  Laterality: Left;  . TUBAL LIGATION    . WRIST SURGERY     for skin cancer    There were no vitals filed for this visit.  Subjective Assessment - 01/01/19 1112    Subjective  Reports stomach is doing better. No new complaints. Haven't had a chance to do her new corner balance exercises yet.    Pertinent History  R frontal/parietal CVA, L occipital CVA on 10/26/2018, HTN, peripheral neuropathy, HLD, B knee replacements, back surgery 2019    Patient Stated Goals  wants to be able to walk better and wants to feel more stable    Currently in Pain?  No/denies         [facility] PT Assessment - 01/01/19 1114      Berg Balance Test   Sit to Stand  Able to stand without using hands and stabilize independently    Standing Unsupported  Able to stand safely 2 minutes    Sitting with Back Unsupported but Feet Supported on Floor or Stool  Able to sit safely and securely 2 minutes    Stand to Sit  Sits safely with minimal use of hands    Transfers  Able to transfer safely, minor use of hands    Standing Unsupported with Eyes Closed  Able to stand 10 seconds safely    Standing Unsupported with Feet Together  Able to place feet together independently and stand 1 minute safely    From Standing, Reach Forward with Outstretched Arm   Can reach confidently >25 cm (10")    From Standing Position, Pick up Object from Floor  Able to pick up shoe safely and easily    From Standing Position, Turn to Look Behind Over each Shoulder  Looks behind from both sides and weight shifts well    Turn 360 Degrees  Able to turn 360 degrees safely one side only in 4 seconds or less   right 3.06 seconds, L 4.28 seconds    Standing Unsupported, Alternately Place Feet on Step/Stool  Able to complete 4 steps without aid or supervision    Standing Unsupported, One Foot in Front  Able to take small step independently and hold 30 seconds    Standing on One Leg  Tries to lift leg/unable to hold 3 seconds but remains standing independently    Total Score  48    Berg comment:  48/56                   [facility] Adult PT Treatment/Exercise - 01/01/19 0001      Ambulation/Gait   Ambulation/Gait  Yes    Ambulation/Gait Assistance  5: Supervision    Ambulation/Gait Assistance Details  Ambulated around gym - scanning environment and looking for objects of different colors. No LOB. Occassional cueing to maintain gait speed.    Ambulation Distance (Feet)  250 Feet    Assistive device  None    Gait Pattern  Step-through pattern;Decreased arm swing - left;Decreased weight shift to left;Decreased step length - right;Decreased stance time - left;Decreased trunk rotation;Trunk flexed;Wide base of support    Ambulation Surface  Level;Indoor    Stairs  Yes    Stairs Assistance  5: Supervision    Stairs Assistance Details (indicate cue type and reason)  Ascends with R handrail and step through pattern, descends using B handrails and step through pattern while taking increased time    Stair Management Technique  Two rails;One rail Right;Alternating pattern;Forwards    Number of Stairs  8    Height of Stairs  6      High Level Balance   High Level Balance Comments  In // bars SLS activites: with 6" step 2 x 5 reps stepping foot on step and holding for 20  seconds, cues for standing tall on SLS leg - use of mirror for visual feedback for posture. With styrofoam cup 1 x 10 B toe taps without crushing cup, pt initially with tendency to brace against // bars when standing on RLE, with therapist providing manual  facilitation to R weight shifting and standing tall on R, performed 1 x 10 reps with toe taps with LLE and pt not needing // bars for support          Balance Exercises - 01/01/19 1240      Balance Exercises: Standing   Standing Eyes Opened  Narrow base of support (BOS);Head turns;Foam/compliant surface;2 reps;10 secs   head nods/turns, reviewed HEP   Standing Eyes Closed  Wide (BOA);Foam/compliant surface;4 reps;20 secs   reviewed HEP, on pillow in corner         PT Short Term Goals - 12/17/18 1020      PT SHORT TERM GOAL #1   Title  Patient will be independent with initial HEP in order to build upon functional gains in therapy. ALL STGs DUE 12/17/18    Baseline  12/17/18: met with current HEP to this date. Updated/advanced with session today.    Time  --    Period  --    Status  Achieved    Target Date  12/17/18      PT SHORT TERM GOAL #2   Title  Patient will decrease 5 times sit <> stand score from low blue mat table to at least 23 seconds in order to improve functional LE strength.    Baseline  12/17/18: 13.13 sec's without UE assist from standard height surface    Time  --    Period  --    Status  Achieved      PT SHORT TERM GOAL #3   Title  Patient will increase BERG score to at least a 42/56 in order to decrease risk of falls.    Baseline  12/17/18: scored 47/56 today    Time  --    Period  --    Status  Achieved      PT SHORT TERM GOAL #4   Title  Patient will decrease TUG score using LRAD to at least 19 seconds in order to decrease risk of falls.    Baseline  12/17/18: 10.44 sec's no device with min guard assist for safety    Time  --    Period  --    Status  Achieved        PT Long Term Goals - 01/01/19  1114      PT LONG TERM GOAL #1   Title  Patient will be independent with final HEP in order to build upon functional gains made in therapy. ALL LTGs DUE 01/16/19    Baseline  pt needing cues for corner balance exercises (added at last session)    Time  8    Period  Weeks    Status  On-going      PT LONG TERM GOAL #2   Title  Patient will ascend/descend 2 steps using single handrail on L with supervision in order to safely enter/exit her home.    Baseline  able to ascend/descend 4 steps with step through pattern and single handrail, when descending 4 steps with step through pattern needs B handrails for safety.    Time  8    Period  Weeks    Status  Partially Met      PT LONG TERM GOAL #3   Title  Patient will increase BERG score to at least a 50/56 in order to decrease risk of falls.    Baseline  01/01/19/ 48/56    Status  On-going      PT LONG TERM GOAL #4  Title  Patient will decrease TUG score using LRAD to at least 16 seconds in order to decrease risk of falls.    Baseline  12/17/18: 10.44 sec's no AD, with min guard assist    Time  8    Period  Weeks    Status  Partially Met      PT LONG TERM GOAL #5   Title  Patient will decrease 5 times sit <> stand score from low blue mat table to at least 19 seconds in order to improve functional LE strength.    Baseline  12/17/18: 13.13 sec's no UE assist from standard height surface    Status  Achieved      PT LONG TERM GOAL #6   Title  Patient will ambulate at least 250' over level surfaces while scanning environment with mod I in order to improve household and community mobility.    Baseline  pt needing supervision for scanning environment performing head turns and looking for objects of different colors in therapy gym, no LOB.    Time  8    Period  Weeks    Status  Partially Met            Plan - 01/01/19 1227    Clinical Impression Statement  10th visit progress note: Focus of today's session was checking pt's LTGs - pt is  progressing well and has achieved/partially met all goals and the remainder pt is on track to meet. Since initial eval, pt has improved her BERG score from a 38/56 to now a 48/56, putting pt at a moderate risk of falls. Pt can now ambulate 2 laps around the gym while scanning environment with supervision. Remainder of session focused on reviewing corner balance HEP (given at last session) and performing SLS activities (pt with more difficulty on R than L). Decreasing pt's frequency to 1x week for 4 weeks due to pt's progress in therapy and pt stating it would make it easier for her sons (they take time off work and drive pt to appointments). Will continue to progress towards LTGs.    Personal Factors and Comorbidities  Age;Comorbidity 3+    Comorbidities  HTN, peripheral neuropathy, HLD    Examination-Activity Limitations  Locomotion Level;Transfers;Stairs    Stability/Clinical Decision Making  Stable/Uncomplicated    Rehab Potential  Good    PT Frequency  1x / week    PT Duration  4 weeks    PT Treatment/Interventions  ADLs/Self Care Home Management;Functional mobility training;Stair training;Gait training;DME Instruction;Therapeutic activities;Therapeutic exercise;Balance training;Neuromuscular re-education;Patient/family education    PT Next Visit Plan  continue balance with eyes closed on compliant surfaces for vestibular input. SLS! Gait outdoors. sit <> stands on foam without UE. SLS balance exercises on foam and compliant surfaces and with EC.  Scanning environment activities.    PT Home Exercise Plan  [id number]    Consulted and Agree with Plan of Care  Patient       Patient will benefit from skilled therapeutic intervention in order to improve the following deficits and impairments:  Abnormal gait, Decreased balance, Decreased coordination, Decreased strength, Difficulty walking, Postural dysfunction  Visit Diagnosis: Unsteadiness on feet  Other abnormalities of gait and mobility  Muscle  weakness (generalized)  Other lack of coordination  Hemiplegia and hemiparesis following cerebral infarction affecting left non-dominant side (HCC)  Hemiparesis affecting left side as late effect of stroke [facility])     Problem List Patient Active Problem List   Diagnosis Date Noted  . Hemiparesis  affecting left side as late effect of stroke ([place])   . Neuropathy   . Acute blood loss anemia   . Hypoalbuminemia due to protein-calorie malnutrition ([place])   . Embolic stroke ([place]) 67/59/1638  . Esophageal stricture 10/30/2018  . Family hx-stroke 10/27/2018  . Advanced age 74/27/2020  . 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 Thrombocytopenia ([place])   . Essential hypertension   . Hyperlipidemia   . Hypokalemia   . Lumbar stenosis with neurogenic claudication 07/03/2017  . Near syncope 11/10/2014  . Nocturnal leg cramps 07/15/2014  . Abnormality of gait 07/15/2014  . Polyneuropathy in other diseases classified elsewhere ([place]) 01/14/2013  . Memory changes 01/14/2013    Arliss Journey, PT, DPT 01/01/2019, 12:40 PM  [facility] [address] [place], [place], [address] Phone: [phone]   Fax:  [phone]  Name: Morgan Boyer MRN: [id number] Date of Birth: [birth year]

## 2019-01-05 ENCOUNTER — Ambulatory Visit (INDEPENDENT_AMBULATORY_CARE_PROVIDER_SITE_OTHER): Payer: PPO | Admitting: *Deleted

## 2019-01-05 DIAGNOSIS — R55 Syncope and collapse: Secondary | ICD-10-CM

## 2019-01-05 DIAGNOSIS — I63411 Cerebral infarction due to embolism of right middle cerebral artery: Secondary | ICD-10-CM | POA: Diagnosis not present

## 2019-01-06 ENCOUNTER — Ambulatory Visit: Payer: PPO | Admitting: Physical Therapy

## 2019-01-06 ENCOUNTER — Other Ambulatory Visit: Payer: Self-pay

## 2019-01-06 DIAGNOSIS — M6281 Muscle weakness (generalized): Secondary | ICD-10-CM

## 2019-01-06 DIAGNOSIS — R278 Other lack of coordination: Secondary | ICD-10-CM

## 2019-01-06 DIAGNOSIS — R2689 Other abnormalities of gait and mobility: Secondary | ICD-10-CM

## 2019-01-06 DIAGNOSIS — R2681 Unsteadiness on feet: Secondary | ICD-10-CM

## 2019-01-06 DIAGNOSIS — Z23 Encounter for immunization: Secondary | ICD-10-CM | POA: Diagnosis not present

## 2019-01-06 LAB — CUP PACEART REMOTE DEVICE CHECK
Date Time Interrogation Session: 20201005200949
Implantable Pulse Generator Implant Date: 20200731

## 2019-01-06 NOTE — Therapy (Signed)
[facility] [address] [place], [place], [address] Phone: [phone]   Fax:  [phone]  Physical Therapy Treatment  Patient Details  Name: Morgan Boyer MRN: [id number] Date of Birth: 1979-11-26 Referring Provider (PT): Charlett Blake, MD   Encounter Date: 01/06/2019  PT End of Session - 01/06/19 2113    Visit Number  11    Number of Visits  17    Date for PT Re-Evaluation  01/16/19    Authorization Type  [employer], VL: Follow Medicare guidelines    PT Start Time  1102    PT Stop Time  1145    PT Time Calculation (min)  43 min    Equipment Utilized During Treatment  Gait belt    Activity Tolerance  Patient tolerated treatment well    Behavior During Therapy  WFL for tasks assessed/performed       Past Medical History:  Diagnosis Date  . Abnormality of gait   . Adenomatous colon polyp   . Arthritis   . Back pain   . Cancer ([place])    skin cancers  . Depression   . Diverticulosis   . DJD (degenerative joint disease) of knee   . Esophageal reflux   . H/O hiatal hernia   . Heart murmur   . Herpes zoster    throat  . History of shingles    left throat 07/2007  . Hyperlipidemia   . Hypertension   . Memory changes 01/14/2013  . Neuromuscular disorder ([place])    peripheral neuropathy  . Nocturnal leg cramps 07/15/2014  . Peripheral neuropathy   . Polyneuropathy in other diseases classified elsewhere ([place]) 01/14/2013  . Severe mitral regurgitation   . Stroke (cerebrum) [facility])     Past Surgical History:  Procedure Laterality Date  . ABDOMINAL HYSTERECTOMY    . APPENDECTOMY    . BACK SURGERY  07/2017  . BUBBLE STUDY  10/31/2018   Procedure: BUBBLE STUDY;  Surgeon: Fay Records, MD;  Location: [facility] ENDOSCOPY;  Service: Cardiovascular;;  . COLON SURGERY     2001 diverticulitis with infection ..drained surgically .  Marland Kitchen EYE SURGERY     cat ext bil   . JOINT REPLACEMENT     right  . KNEE  ARTHROSCOPY  11/08/2011  . LOOP RECORDER INSERTION N/A 10/31/2018   Procedure: LOOP RECORDER INSERTION;  Surgeon: Constance Haw, MD;  Location: [facility] CV LAB;  Service: Cardiovascular;  Laterality: N/A;  . TEE WITHOUT CARDIOVERSION N/A 10/31/2018   Procedure: TRANSESOPHAGEAL ECHOCARDIOGRAM (TEE);  Surgeon: Fay Records, MD;  Location: [facility] ENDOSCOPY;  Service: Cardiovascular;  Laterality: N/A;  . TONSILLECTOMY    . TOTAL KNEE ARTHROPLASTY  11/07/2011   Procedure: TOTAL KNEE ARTHROPLASTY;  Surgeon: Ninetta Lights, MD;  Location: [facility];  Service: Orthopedics;  Laterality: Left;  . TUBAL LIGATION    . WRIST SURGERY     for skin cancer    There were no vitals filed for this visit.  Subjective Assessment - 01/06/19 1116    Subjective  Asking about exercises that she can do to work back towards driving (son was also inquiring) - no falls at home. Exercises at home have been going well.    Pertinent History  R frontal/parietal CVA, L occipital CVA on 10/26/2018, HTN, peripheral neuropathy, HLD, B knee replacements, back surgery 2019    Patient Stated Goals  wants to be able to walk better and wants to feel more  stable    Currently in Pain?  No/denies                   01/06/19 2107  Ambulation/Gait  Ambulation/Gait Yes  Ambulation/Gait Assistance 5: Supervision  Ambulation/Gait Assistance Details Ambulated outdoors on paved surfaces with cognitive task, pt asked to name animals starting from A-Z, pt needing intermittent assistance to name, cues to maintain gait speed and foot clearance while ambulating up inclines  Ambulation Distance (Feet) 650 Feet  Assistive device None  Gait Pattern Step-through pattern;Decreased arm swing - left;Decreased weight shift to left;Decreased step length - right;Decreased stance time - left;Decreased trunk rotation;Trunk flexed;Wide base of support  Ambulation Surface Unlevel;Outdoor;Paved  Therapeutic Activites   Therapeutic Activities Other  Therapeutic Activities  Other Therapeutic Activities At beginning of session, pt asking what kind of exercises she can do in PT in order to work on driving (son was also inquiring this) - therapist providing information to pt that her physician will be the one to clear her for driving (Dr. Letta Pate). Discussed with pt that she needs to have OT appointments re-scheduled as well as a ST eval in order to further assess pt's cognition (had one scheduled at beginning of September but it was cancelled and never rescheduled). Pt asking questions why she would need speech therapy - states "I don't need to work on my speech" - therapist providing education on what speech therapy would assess in regards to cognition and memory. Typed up discussion with pt to give to pt's son so he will be aware.            Balance Exercises - 01/06/19 2112      Balance Exercises: Standing   Other Standing Exercises  feet together on balance beam with cognitive task (naming desserts, takes increased time, needs therapist assistance for naming at times), with feet apart on rocker board rocking A/P with eyes open and then progressing to eyes closed, with therapist asking pt to discuss about the book she is currently reading - min guard for balance. 4" SLS step taps with no UE support 2 x 10 reps         PT Education - 01/07/19 1250    Education Details  see other therapeutic activities    Person(s) Educated  Patient    Methods  Explanation;Handout    Comprehension  Verbalized understanding;Returned demonstration;Need further instruction       PT Short Term Goals - 12/17/18 1020      PT SHORT TERM GOAL #1   Title  Patient will be independent with initial HEP in order to build upon functional gains in therapy. ALL STGs DUE 12/17/18    Baseline  12/17/18: met with current HEP to this date. Updated/advanced with session today.    Time  --    Period  --    Status  Achieved    Target Date  12/17/18      PT SHORT  TERM GOAL #2   Title  Patient will decrease 5 times sit <> stand score from low blue mat table to at least 23 seconds in order to improve functional LE strength.    Baseline  12/17/18: 13.13 sec's without UE assist from standard height surface    Time  --    Period  --    Status  Achieved      PT SHORT TERM GOAL #3   Title  Patient will increase BERG score to at least a 42/56 in order to  decrease risk of falls.    Baseline  12/17/18: scored 47/56 today    Time  --    Period  --    Status  Achieved      PT SHORT TERM GOAL #4   Title  Patient will decrease TUG score using LRAD to at least 19 seconds in order to decrease risk of falls.    Baseline  12/17/18: 10.44 sec's no device with min guard assist for safety    Time  --    Period  --    Status  Achieved        PT Long Term Goals - 01/01/19 1114      PT LONG TERM GOAL #1   Title  Patient will be independent with final HEP in order to build upon functional gains made in therapy. ALL LTGs DUE 01/16/19    Baseline  pt needing cues for corner balance exercises (added at last session)    Time  8    Period  Weeks    Status  On-going      PT LONG TERM GOAL #2   Title  Patient will ascend/descend 2 steps using single handrail on L with supervision in order to safely enter/exit her home.    Baseline  able to ascend/descend 4 steps with step through pattern and single handrail, when descending 4 steps with step through pattern needs B handrails for safety.    Time  8    Period  Weeks    Status  Partially Met      PT LONG TERM GOAL #3   Title  Patient will increase BERG score to at least a 50/56 in order to decrease risk of falls.    Baseline  01/01/19/ 48/56    Status  On-going      PT LONG TERM GOAL #4   Title  Patient will decrease TUG score using LRAD to at least 16 seconds in order to decrease risk of falls.    Baseline  12/17/18: 10.44 sec's no AD, with min guard assist    Time  8    Period  Weeks    Status  Partially Met       PT LONG TERM GOAL #5   Title  Patient will decrease 5 times sit <> stand score from low blue mat table to at least 19 seconds in order to improve functional LE strength.    Baseline  12/17/18: 13.13 sec's no UE assist from standard height surface    Status  Achieved      PT LONG TERM GOAL #6   Title  Patient will ambulate at least 250' over level surfaces while scanning environment with mod I in order to improve household and community mobility.    Baseline  pt needing supervision for scanning environment performing head turns and looking for objects of different colors in therapy gym, no LOB.    Time  8    Period  Weeks    Status  Partially Met            Plan - 01/07/19 1215    Clinical Impression Statement  At beginning of sesion, pt inquiring about doing exercises about return to driving. Therapist providing education that pt's physician will clear her for driving. Remainder of session focused on gait and balance training with dual tasking for additional cognitive challenge - asking pt to name different animals A-Z, naming desserts - pt with increased time thinking of objects especially while ambulating  outdoors, no LOB noted, but needs ocassional assist from therapist to think of an object. When standing on rocker board, pt with no difficulty talking to therapist about the book that she is reading with eyes open and eyes closed. Min guard for balance provided when eyes are closed. Pt is progressing well - will continue to progress towards LTGs.    Personal Factors and Comorbidities  Age;Comorbidity 3+    Comorbidities  HTN, peripheral neuropathy, HLD    Examination-Activity Limitations  Locomotion Level;Transfers;Stairs    Stability/Clinical Decision Making  Stable/Uncomplicated    Rehab Potential  Good    PT Frequency  1x / week    PT Duration  4 weeks    PT Treatment/Interventions  ADLs/Self Care Home Management;Functional mobility training;Stair training;Gait training;DME  Instruction;Therapeutic activities;Therapeutic exercise;Balance training;Neuromuscular re-education;Patient/family education    PT Next Visit Plan  continue balance with eyes closed on compliant surfaces for vestibular input as well as cognitive challenger with balance. SLS! Gait outdoors. sit <> stands on foam without UE. SLS balance exercises on foam and compliant surfaces and with EC.  Scanning environment activities.    PT Home Exercise Plan  [id number]    Consulted and Agree with Plan of Care  Patient       Patient will benefit from skilled therapeutic intervention in order to improve the following deficits and impairments:  Abnormal gait, Decreased balance, Decreased coordination, Decreased strength, Difficulty walking, Postural dysfunction  Visit Diagnosis: Unsteadiness on feet  Other abnormalities of gait and mobility  Muscle weakness (generalized)  Other lack of coordination     Problem List Patient Active Problem List   Diagnosis Date Noted  . Hemiparesis affecting left side as late effect of stroke ([place])   . Neuropathy   . Acute blood loss anemia   . Hypoalbuminemia due to protein-calorie malnutrition ([place])   . Embolic stroke ([place]) 98/01/9146  . Esophageal stricture 10/30/2018  . Family hx-stroke 10/27/2018  . Advanced age 50/27/2020  . Stroke (cerebrum) ([facility])-R MCA & L MCA infarcts, embolic, source unknown 82/95/6213  . Thrombocytopenia ([place])   . Essential hypertension   . Hyperlipidemia   . Hypokalemia   . Lumbar stenosis with neurogenic claudication 07/03/2017  . Near syncope 11/10/2014  . Nocturnal leg cramps 07/15/2014  . Abnormality of gait 07/15/2014  . Polyneuropathy in other diseases classified elsewhere ([place]) 01/14/2013  . Memory changes 01/14/2013    Arliss Journey, PT, DPT  01/07/2019, 12:53 PM  [facility] [address] [place], [place], [address] Phone: [phone]   Fax:   [phone]  Name: AZLYN WINGLER MRN: [id number] Date of Birth: 09/25/1931

## 2019-01-07 ENCOUNTER — Ambulatory Visit: Payer: PPO | Admitting: Occupational Therapy

## 2019-01-07 ENCOUNTER — Encounter: Payer: Self-pay | Admitting: Occupational Therapy

## 2019-01-07 DIAGNOSIS — I69354 Hemiplegia and hemiparesis following cerebral infarction affecting left non-dominant side: Secondary | ICD-10-CM

## 2019-01-07 DIAGNOSIS — R2681 Unsteadiness on feet: Secondary | ICD-10-CM | POA: Diagnosis not present

## 2019-01-07 DIAGNOSIS — M6281 Muscle weakness (generalized): Secondary | ICD-10-CM

## 2019-01-07 DIAGNOSIS — R278 Other lack of coordination: Secondary | ICD-10-CM

## 2019-01-07 NOTE — Therapy (Signed)
[facility] [address] [place], [place], [address] Phone: [phone]   Fax:  [phone]  Occupational Therapy Treatment  Patient Details  Name: Morgan Boyer MRN: [id number] Date of Birth: [birth year] Referring Provider (OT): Dr. Letta Pate   Encounter Date: 01/07/2019  OT End of Session - 01/07/19 1750    Visit Number  3    Number of Visits  5    Date for OT Re-Evaluation  01/21/19   date adjusted as pt missed several weeks and just returned today for 3rd visit   Authorization Type  [employer] - follow medicare guidelines for VL    Authorization Time Period  90 days    OT Start Time  1018    OT Stop Time  1100    OT Time Calculation (min)  42 min    Activity Tolerance  Patient tolerated treatment well       Past Medical History:  Diagnosis Date  . Abnormality of gait   . Adenomatous colon polyp   . Arthritis   . Back pain   . Cancer ([place])    skin cancers  . Depression   . Diverticulosis   . DJD (degenerative joint disease) of knee   . Esophageal reflux   . H/O hiatal hernia   . Heart murmur   . Herpes zoster    throat  . History of shingles    left throat 07/2007  . Hyperlipidemia   . Hypertension   . Memory changes 01/14/2013  . Neuromuscular disorder ([place])    peripheral neuropathy  . Nocturnal leg cramps 07/15/2014  . Peripheral neuropathy   . Polyneuropathy in other diseases classified elsewhere ([place]) 01/14/2013  . Severe mitral regurgitation   . Stroke (cerebrum) [facility])     Past Surgical History:  Procedure Laterality Date  . ABDOMINAL HYSTERECTOMY    . APPENDECTOMY    . BACK SURGERY  07/2017  . BUBBLE STUDY  10/31/2018   Procedure: BUBBLE STUDY;  Surgeon: Fay Records, MD;  Location: [facility] ENDOSCOPY;  Service: Cardiovascular;;  . COLON SURGERY     2001 diverticulitis with infection ..drained surgically .  Marland Kitchen EYE SURGERY     cat ext bil   . JOINT REPLACEMENT     right  . KNEE  ARTHROSCOPY  11/08/2011  . LOOP RECORDER INSERTION N/A 10/31/2018   Procedure: LOOP RECORDER INSERTION;  Surgeon: Constance Haw, MD;  Location: [facility] CV LAB;  Service: Cardiovascular;  Laterality: N/A;  . TEE WITHOUT CARDIOVERSION N/A 10/31/2018   Procedure: TRANSESOPHAGEAL ECHOCARDIOGRAM (TEE);  Surgeon: Fay Records, MD;  Location: [facility] ENDOSCOPY;  Service: Cardiovascular;  Laterality: N/A;  . TONSILLECTOMY    . TOTAL KNEE ARTHROPLASTY  11/07/2011   Procedure: TOTAL KNEE ARTHROPLASTY;  Surgeon: Ninetta Lights, MD;  Location: [facility];  Service: Orthopedics;  Laterality: Left;  . TUBAL LIGATION    . WRIST SURGERY     for skin cancer    There were no vitals filed for this visit.  Subjective Assessment - 01/07/19 1021    Subjective   I am not having any trouble using my left hand at all any more.    Pertinent History  R frontal/parietal CVA, L occipital CVA on 10/26/2018. PMH:    Patient Stated Goals  To be able to walk better and have better balance    Currently in Pain?  No/denies  OT Treatments/Exercises (OP) - 01/07/19 0001      ADLs   Cooking  Addressed simple familiar hot meal prep - pt demonstrated good safety awarenss (i.e turned off stove, stated the pan was not cool enough to wash yet, etc).  Even with overt distraction, pt able to attend to task. Pt reports at this time she isn't doing much cooking partly due to friends and family bringing meals and partly due to "no interest - there is only me so I am not very motivated to cook just for myself."     Driving  Discussed driving recommnedations when pt asked about this - stressed the need for medical clearance first and strongly recommended that pt follow through with speech evaluation to assess higher level cognition (scheduled next week). Pt in agreement .  Provided plan for graded supervised return to driving (see pt instruction section) ;pending ST eval and medical clearance. Pt verbablized  understanding.  Information provided in writing.      ADL Comments  Assessed goals - see goal section for updates.  Pt reports she has been compliant with HEP for grip strengt and coordination and feels she can use her LUE "mormally."  Grip strength and coordination now appear to be WFL's.               OT Education - 01/07/19 1746    Education Details  return to driving recommendations pending ST eval and medical clearance    Person(s) Educated  Patient    Comprehension  Verbalized understanding          OT Long Term Goals - 01/07/19 1747      OT LONG TERM GOAL #1   Title  Pt will be mod I for HEP for grip strength, wrist extension strength and coordination of LUE - 01/21/2019 (date adjusted as pt just returned for third OT visit and has missed several weeks)    Status  Achieved   01/07/2019  grip = 30 pounds, 9 hole peg test = 25.36     OT LONG TERM GOAL #2   Title  Pt will be mod with splint wear and care for LUE wrist support PRN    Status  Deferred   pt has no pain in wrist and MMT for wrist flexion/extension WFL's     OT LONG TERM GOAL #3   Title  Pt will be mod I with simple hot meal prep    Status  On-going            Plan - 01/07/19 1748    Clinical Impression Statement  Pt has met all goals and is ready for d/c.  Pt in agreement.    OT Occupational Profile and History  Detailed Assessment- Review of Records and additional review of physical, cognitive, psychosocial history related to current functional performance    Occupational performance deficits (Please refer to evaluation for details):  IADL's;Leisure;Rest and Sleep;ADL's    Body Structure / Function / Physical Skills  ADL;Balance;FMC;IADL;GMC;UE functional use;Strength    Rehab Potential  Good    Clinical Decision Making  Limited treatment options, no task modification necessary    Comorbidities Affecting Occupational Performance:  May have comorbidities impacting occupational performance     Modification or Assistance to Complete Evaluation   Min-Moderate modification of tasks or assist with assess necessary to complete eval    OT Frequency  2x / week    OT Duration  2 weeks    OT Treatment/Interventions  Self-care/ADL training;DME  and/or AE instruction;Neuromuscular education;Therapeutic exercise;Therapeutic activities;Splinting;Patient/family education    Plan  d/c from OT    Consulted and Agree with Plan of Care  Patient       Patient will benefit from skilled therapeutic intervention in order to improve the following deficits and impairments:   Body Structure / Function / Physical Skills: ADL, Balance, FMC, IADL, GMC, UE functional use, Strength       Visit Diagnosis: Unsteadiness on feet  Muscle weakness (generalized)  Other lack of coordination  Hemiplegia and hemiparesis following cerebral infarction affecting left non-dominant side [facility])    Problem List Patient Active Problem List   Diagnosis Date Noted  . Hemiparesis affecting left side as late effect of stroke ([place])   . Neuropathy   . Acute blood loss anemia   . Hypoalbuminemia due to protein-calorie malnutrition ([place])   . Embolic stroke ([place]) 71/58/0638  . Esophageal stricture 10/30/2018  . Family hx-stroke 10/27/2018  . Advanced age 95/27/2020  . Stroke (cerebrum) ([facility])-R MCA & L MCA infarcts, embolic, source unknown 68/54/8830  . Thrombocytopenia ([place])   . Essential hypertension   . Hyperlipidemia   . Hypokalemia   . Lumbar stenosis with neurogenic claudication 07/03/2017  . Near syncope 11/10/2014  . Nocturnal leg cramps 07/15/2014  . Abnormality of gait 07/15/2014  . Polyneuropathy in other diseases classified elsewhere ([place]) 01/14/2013  . Memory changes 01/14/2013  OCCUPATIONAL THERAPY DISCHARGE SUMMARY  Visits from Start of Care: 3  Current functional level related to goals / functional outcomes: See above   Remaining deficits: Higher level balance deficits, ST eval to assess  higher level cognition   Education / Equipment: HEP Plan: Patient agrees to discharge.  Patient goals were met. Patient is being discharged due to meeting the stated rehab goals.  ?????      Quay Burow, OTR/L 01/07/2019, 5:53 PM  [facility] [address] [place], [place], [address] Phone: [phone]   Fax:  [phone]  Name: TALLULAH HOSMAN MRN: [id number] Date of Birth: Jun 07, 1931

## 2019-01-07 NOTE — Patient Instructions (Signed)
Today you asked about potentially returning to driving.  Right now you haven't lost your license however it you drive without clearance from your doctor (Dr. Letta Pate) and have an accident your insurance may not cover it.    I would recommend that you wait until you are done with therapy and then talk with Dr. Letta Pate. IF DOCTOR KIRSTEINS CLEARS YOU TO DRIVE MEDICALLY,  he will want you to do a graduated return to driving:  1. Start in an empty parking lot and have someone with you to supervise 2. Once you feel comfortable with that, graduate to your neighborhood streets that you are familiar with and that are quiet. You should have someone in the car with you to supervise. 3. You can then drive on area roads that are a little busier - again, make sure you have some one with you to supervise. You reported to me that this is really the only driving you did before. Please make sure you have supervision in the car with you for each step and as long as you and the person supervising feel comfortable you can move on to the next step.

## 2019-01-08 ENCOUNTER — Ambulatory Visit: Payer: PPO | Admitting: Physical Therapy

## 2019-01-12 NOTE — Progress Notes (Signed)
[facility] Summary Report / Loop Recorder 

## 2019-01-13 ENCOUNTER — Encounter: Payer: Self-pay | Admitting: Physical Therapy

## 2019-01-13 ENCOUNTER — Other Ambulatory Visit: Payer: Self-pay

## 2019-01-13 ENCOUNTER — Ambulatory Visit: Payer: PPO | Admitting: Physical Therapy

## 2019-01-13 DIAGNOSIS — I69354 Hemiplegia and hemiparesis following cerebral infarction affecting left non-dominant side: Secondary | ICD-10-CM

## 2019-01-13 DIAGNOSIS — R2681 Unsteadiness on feet: Secondary | ICD-10-CM

## 2019-01-13 DIAGNOSIS — R278 Other lack of coordination: Secondary | ICD-10-CM

## 2019-01-13 DIAGNOSIS — M6281 Muscle weakness (generalized): Secondary | ICD-10-CM

## 2019-01-13 NOTE — Therapy (Signed)
[facility] [address] [place], [place], [address] Phone: [phone]   Fax:  [phone]  Physical Therapy Treatment/ D/C Summary   Patient Details  Name: Morgan Boyer MRN: [id number] Date of Birth: [birth year] Referring Provider (PT): Letta Pate Luanna Salk, MD   Encounter Date: 01/13/2019  PT End of Session - 01/13/19 1351    Visit Number  12    Number of Visits  17    Date for PT Re-Evaluation  01/16/19    Authorization Type  [employer], VL: Follow Medicare guidelines    PT Start Time  1106    PT Stop Time  1146    PT Time Calculation (min)  40 min    Equipment Utilized During Treatment  Gait belt    Activity Tolerance  Patient tolerated treatment well    Behavior During Therapy  WFL for tasks assessed/performed       Past Medical History:  Diagnosis Date  . Abnormality of gait   . Adenomatous colon polyp   . Arthritis   . Back pain   . Cancer ([place])    skin cancers  . Depression   . Diverticulosis   . DJD (degenerative joint disease) of knee   . Esophageal reflux   . H/O hiatal hernia   . Heart murmur   . Herpes zoster    throat  . History of shingles    left throat 07/2007  . Hyperlipidemia   . Hypertension   . Memory changes 01/14/2013  . Neuromuscular disorder ([place])    peripheral neuropathy  . Nocturnal leg cramps 07/15/2014  . Peripheral neuropathy   . Polyneuropathy in other diseases classified elsewhere ([place]) 01/14/2013  . Severe mitral regurgitation   . Stroke (cerebrum) [facility])     Past Surgical History:  Procedure Laterality Date  . ABDOMINAL HYSTERECTOMY    . APPENDECTOMY    . BACK SURGERY  07/2017  . BUBBLE STUDY  10/31/2018   Procedure: BUBBLE STUDY;  Surgeon: Fay Records, MD;  Location: [facility] ENDOSCOPY;  Service: Cardiovascular;;  . COLON SURGERY     2001 diverticulitis with infection ..drained surgically .  Marland Kitchen EYE SURGERY     cat ext bil   . JOINT REPLACEMENT     right  .  KNEE ARTHROSCOPY  11/08/2011  . LOOP RECORDER INSERTION N/A 10/31/2018   Procedure: LOOP RECORDER INSERTION;  Surgeon: Constance Haw, MD;  Location: [facility] CV LAB;  Service: Cardiovascular;  Laterality: N/A;  . TEE WITHOUT CARDIOVERSION N/A 10/31/2018   Procedure: TRANSESOPHAGEAL ECHOCARDIOGRAM (TEE);  Surgeon: Fay Records, MD;  Location: [facility] ENDOSCOPY;  Service: Cardiovascular;  Laterality: N/A;  . TONSILLECTOMY    . TOTAL KNEE ARTHROPLASTY  11/07/2011   Procedure: TOTAL KNEE ARTHROPLASTY;  Surgeon: Ninetta Lights, MD;  Location: [facility];  Service: Orthopedics;  Laterality: Left;  . TUBAL LIGATION    . WRIST SURGERY     for skin cancer    There were no vitals filed for this visit.  Subjective Assessment - 01/13/19 1110    Subjective  No falls. Has been doing exercises at home. Thinks her balance and walking has gotten better.    Pertinent History  R frontal/parietal CVA, L occipital CVA on 10/26/2018, HTN, peripheral neuropathy, HLD, B knee replacements, back surgery 2019    Patient Stated Goals  wants to be able to walk better and wants to feel more stable    Currently in Pain?  No/denies  [facility] PT Assessment - 01/13/19 0001      Berg Balance Test   Sit to Stand  Able to stand without using hands and stabilize independently    Standing Unsupported  Able to stand safely 2 minutes    Sitting with Back Unsupported but Feet Supported on Floor or Stool  Able to sit safely and securely 2 minutes    Stand to Sit  Sits safely with minimal use of hands    Transfers  Able to transfer safely, minor use of hands    Standing Unsupported with Eyes Closed  Able to stand 10 seconds safely    Standing Unsupported with Feet Together  Able to place feet together independently and stand 1 minute safely    From Standing, Reach Forward with Outstretched Arm  Can reach confidently >25 cm (10")    From Standing Position, Pick up Object from Floor  Able to pick up shoe safely and easily     From Standing Position, Turn to Look Behind Over each Shoulder  Looks behind from both sides and weight shifts well    Turn 360 Degrees  Able to turn 360 degrees safely in 4 seconds or less    Standing Unsupported, Alternately Place Feet on Step/Stool  Able to complete 4 steps without aid or supervision    Standing Unsupported, One Foot in Front  Able to plae foot ahead of the other independently and hold 30 seconds    Standing on One Leg  Able to lift leg independently and hold 5-10 seconds    Total Score  52    Berg comment:  52/56      Timed Up and Go Test   Normal TUG (seconds)  11.5    TUG Comments  no AD, supervision                   [facility] Adult PT Treatment/Exercise - 01/13/19 0001      Ambulation/Gait   Ambulation/Gait  Yes    Ambulation/Gait Assistance  5: Supervision;6: Modified independent (Device/Increase time)    Ambulation/Gait Assistance Details  Mod I on indoor level surfaces while scanning environment. Supervision on unlevel paved surfaces while asking pt to scan environment, pt with no LOB - decreased gait speed at times and intermittent L foot scuffing when walking up inclines - pt able to correct after verbal cueing. Educated pt to start walking outdoors 3 times a week for 5-10 minutes for aerobic activity when one of her sons is present (printed out instructions for pt to give to her son as well as include this on pt's HEP)    Ambulation Distance (Feet)  600 Feet    Assistive device  None    Gait Pattern  Step-through pattern;Decreased arm swing - left;Decreased weight shift to left;Decreased step length - right;Decreased stance time - left;Decreased trunk rotation;Trunk flexed;Wide base of support    Ambulation Surface  Level;Unlevel;Indoor;Outdoor;Paved    Stairs  Yes    Stairs Assistance  5: Supervision    Stairs Assistance Details (indicate cue type and reason)  Ascends with step through pattern with single R handrail, descends with step through and BUE  support on railings for increased safety.    Number of Stairs  8    Height of Stairs  6           Access Code: [email or web address]  URL: [email or web address]  Date: 01/13/2019  Prepared by: Janann August   Program Notes  Walking program: Walking with one of  your sons, try 3 times a week outside, start with a gentle 5-10 minute walk.  Reviewed pt's final HEP:    Exercises Sit to Stand without Arm Support - 10 reps - 1 sets - 1x daily - 5x weekly Standing Knee Flexion with Counter Support - 10 reps - 1 sets - 1x daily - 5x weekly Tandem Walking with Counter Support - 2x daily - 7x weekly Side Stepping with Resistance at Thighs and Counter Support - 3 reps - 1 sets - 2x daily - 5x weekly - upgraded to green theraband, cues for larger lateral steps  Walking March - 3 reps - 1 sets - 1x daily - 5x weekly Toe Walking with Counter Support - 1 sets - 3 reps - 2x daily - 5x weekly - added backwards toe walking  Heel Walking - 3 sets - 2x daily - 5x weekly - added backwards heel walking  Romberg Stance on Foam Pad - 10 reps - 2 sets - 2x daily - 7x weekly Standing Balance with Eyes Closed on Foam - 3 reps - 20 hold - 2x daily - 7x weekly     PT Short Term Goals - 12/17/18 1020      PT SHORT TERM GOAL #1   Title  Patient will be independent with initial HEP in order to build upon functional gains in therapy. ALL STGs DUE 12/17/18    Baseline  12/17/18: met with current HEP to this date. Updated/advanced with session today.    Time  --    Period  --    Status  Achieved    Target Date  12/17/18      PT SHORT TERM GOAL #2   Title  Patient will decrease 5 times sit <> stand score from low blue mat table to at least 23 seconds in order to improve functional LE strength.    Baseline  12/17/18: 13.13 sec's without UE assist from standard height surface    Time  --    Period  --    Status  Achieved      PT SHORT TERM GOAL #3   Title  Patient will increase BERG score to at least a  42/56 in order to decrease risk of falls.    Baseline  12/17/18: scored 47/56 today    Time  --    Period  --    Status  Achieved      PT SHORT TERM GOAL #4   Title  Patient will decrease TUG score using LRAD to at least 19 seconds in order to decrease risk of falls.    Baseline  12/17/18: 10.44 sec's no device with min guard assist for safety    Time  --    Period  --    Status  Achieved        PT Long Term Goals - 01/13/19 1112      PT LONG TERM GOAL #1   Title  Patient will be independent with final HEP in order to build upon functional gains made in therapy. ALL LTGs DUE 01/16/19    Baseline  pt needing cues for corner balance exercises (added at last session)    Time  8    Period  Weeks    Status  Achieved      PT LONG TERM GOAL #2   Title  Patient will ascend/descend 2 steps using single handrail on L with supervision in order to safely enter/exit her home.    Baseline  able to ascend/descend 4 steps with step through pattern and single handrail, when descending 4 steps with step through pattern continues to need B handrails for safety.    Time  8    Period  Weeks    Status  Partially Met      PT LONG TERM GOAL #3   Title  Patient will increase BERG score to at least a 50/56 in order to decrease risk of falls.    Baseline  52/56 on 01/13/19    Status  Achieved      PT LONG TERM GOAL #4   Title  Patient will decrease TUG score using LRAD to at least 16 seconds in order to decrease risk of falls.    Baseline  11.50 seconds no AD and supervision    Time  8    Period  Weeks    Status  Achieved      PT LONG TERM GOAL #5   Title  Patient will decrease 5 times sit <> stand score from low blue mat table to at least 19 seconds in order to improve functional LE strength.    Baseline  12/17/18: 13.13 sec's no UE assist from standard height surface    Status  Achieved      PT LONG TERM GOAL #6   Title  Patient will ambulate at least 250' over level surfaces while scanning  environment with mod I in order to improve household and community mobility.    Baseline  able to perform mod I in therapy gym over level surfaces, slows gait speed. outdoors requires supervision over pavement.    Time  8    Period  Weeks    Status  Achieved            Plan - 01/13/19 1353    Clinical Impression Statement  Focus of today's session was assessing pt's LTGs for D/C vs. re-cert: patient has met 5 of 6 LTGs The only LTG pt did not meet was in regards to performing steps - pt continues to need BUE handrail assist for descending stairs in order for increased safety. Pt has increased her BERG score today to a 52/56 (last time it was assessed it was 48/56) - decreasing pt's risk of falls. Pt able to ambulate outdoors over paved surfaces while conversing with therapist and scanning environment with no LOB. Pt is pleased with her progress with PT and is independent with her HEP. At this time pt will be discharged from PT.    Personal Factors and Comorbidities  Age;Comorbidity 3+    Comorbidities  HTN, peripheral neuropathy, HLD    Examination-Activity Limitations  Locomotion Level;Transfers;Stairs    Stability/Clinical Decision Making  Stable/Uncomplicated    Rehab Potential  Good    PT Frequency  1x / week    PT Duration  4 weeks    PT Treatment/Interventions  ADLs/Self Care Home Management;Functional mobility training;Stair training;Gait training;DME Instruction;Therapeutic activities;Therapeutic exercise;Balance training;Neuromuscular re-education;Patient/family education    PT Next Visit Plan  D/C    PT Home Exercise Plan  [id number]    Consulted and Agree with Plan of Care  Patient      PHYSICAL THERAPY DISCHARGE SUMMARY  Visits from Start of Care: 12  Current functional level related to goals / functional outcomes: See LTGs.   Remaining deficits: Impaired SLS time, balance with eyes closed on compliant surfaces.    Education / Equipment: HEP  Plan: Patient agrees to  discharge.  Patient goals were met. Patient  is being discharged due to meeting the stated rehab goals.  ?????       Patient will benefit from skilled therapeutic intervention in order to improve the following deficits and impairments:  Abnormal gait, Decreased balance, Decreased coordination, Decreased strength, Difficulty walking, Postural dysfunction  Visit Diagnosis: Unsteadiness on feet  Muscle weakness (generalized)  Other lack of coordination  Hemiplegia and hemiparesis following cerebral infarction affecting left non-dominant side [facility])     Problem List Patient Active Problem List   Diagnosis Date Noted  . Hemiparesis affecting left side as late effect of stroke ([place])   . Neuropathy   . Acute blood loss anemia   . Hypoalbuminemia due to protein-calorie malnutrition ([place])   . Embolic stroke ([place]) 16/01/9603  . Esophageal stricture 10/30/2018  . Family hx-stroke 10/27/2018  . Advanced age 35/27/2020  . 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 Thrombocytopenia ([place])   . Essential hypertension   . Hyperlipidemia   . Hypokalemia   . Lumbar stenosis with neurogenic claudication 07/03/2017  . Near syncope 11/10/2014  . Nocturnal leg cramps 07/15/2014  . Abnormality of gait 07/15/2014  . Polyneuropathy in other diseases classified elsewhere ([place]) 01/14/2013  . Memory changes 01/14/2013    Arliss Journey, PT, DPT  01/13/2019, 2:01 PM  [facility] [address] [place], [place], [address] Phone: [phone]   Fax:  [phone]  Name: SHALISSA EASTERWOOD MRN: [id number] Date of Birth: October 01, 1931

## 2019-01-13 NOTE — Patient Instructions (Signed)
Access Code: [email or web address]  URL: [email or web address]  Date: 01/13/2019  Prepared by: Janann August   Program Notes  Walking program: Walking with one of your sons, try 3 times a week outside, start with a gentle 5-10 minute walk.   Exercises Sit to Stand without Arm Support - 10 reps - 1 sets - 1x daily - 5x weekly Standing Knee Flexion with Counter Support - 10 reps - 1 sets - 1x daily - 5x weekly Tandem Walking with Counter Support - 2x daily - 7x weekly Side Stepping with Resistance at Thighs and Counter Support - 3 reps - 1 sets - 2x daily - 5x weekly Walking March - 3 reps - 1 sets - 1x daily - 5x weekly Toe Walking with Counter Support - 1 sets - 3 reps - 2x daily - 5x weekly Heel Walking - 3 sets - 2x daily - 5x weekly Romberg Stance on Foam Pad - 10 reps - 2 sets - 2x daily - 7x weekly Standing Balance with Eyes Closed on Foam - 3 reps - 20 hold - 2x daily - 7x weekly

## 2019-01-14 DIAGNOSIS — R7303 Prediabetes: Secondary | ICD-10-CM | POA: Diagnosis not present

## 2019-01-14 DIAGNOSIS — E7800 Pure hypercholesterolemia, unspecified: Secondary | ICD-10-CM | POA: Diagnosis not present

## 2019-01-14 DIAGNOSIS — Z8719 Personal history of other diseases of the digestive system: Secondary | ICD-10-CM | POA: Diagnosis not present

## 2019-01-14 DIAGNOSIS — K219 Gastro-esophageal reflux disease without esophagitis: Secondary | ICD-10-CM | POA: Diagnosis not present

## 2019-01-14 DIAGNOSIS — I10 Essential (primary) hypertension: Secondary | ICD-10-CM | POA: Diagnosis not present

## 2019-01-15 ENCOUNTER — Ambulatory Visit: Payer: PPO | Admitting: Physical Therapy

## 2019-01-19 ENCOUNTER — Encounter: Payer: Self-pay | Admitting: Physical Therapy

## 2019-01-20 ENCOUNTER — Other Ambulatory Visit: Payer: Self-pay

## 2019-01-20 ENCOUNTER — Ambulatory Visit: Payer: PPO | Admitting: Speech Pathology

## 2019-01-20 ENCOUNTER — Ambulatory Visit: Payer: PPO | Admitting: Physical Therapy

## 2019-01-20 DIAGNOSIS — R41841 Cognitive communication deficit: Secondary | ICD-10-CM

## 2019-01-20 DIAGNOSIS — R2681 Unsteadiness on feet: Secondary | ICD-10-CM | POA: Diagnosis not present

## 2019-01-20 NOTE — Therapy (Signed)
[facility] [address] [place], [place], [address] Phone: [phone]   Fax:  [phone]  Speech Language Pathology Evaluation  Patient Details  Name: Morgan Boyer MRN: [id number] Date of Birth: 1931/04/21 Referring Provider (SLP): Reesa Chew, [place]   Encounter Date: 01/20/2019  End of Session - 01/20/19 1320    Visit Number  1    Number of Visits  1    Date for SLP Re-Evaluation  01/20/19    SLP Start Time  1150    SLP Stop Time   [id number]    SLP Time Calculation (min)  45 min    Activity Tolerance  Patient tolerated treatment well       Past Medical History:  Diagnosis Date  . Abnormality of gait   . Adenomatous colon polyp   . Arthritis   . Back pain   . Cancer ([place])    skin cancers  . Depression   . Diverticulosis   . DJD (degenerative joint disease) of knee   . Esophageal reflux   . H/O hiatal hernia   . Heart murmur   . Herpes zoster    throat  . History of shingles    left throat 07/2007  . Hyperlipidemia   . Hypertension   . Memory changes 01/14/2013  . Neuromuscular disorder ([place])    peripheral neuropathy  . Nocturnal leg cramps 07/15/2014  . Peripheral neuropathy   . Polyneuropathy in other diseases classified elsewhere ([place]) 01/14/2013  . Severe mitral regurgitation   . Stroke (cerebrum) [facility])     Past Surgical History:  Procedure Laterality Date  . ABDOMINAL HYSTERECTOMY    . APPENDECTOMY    . BACK SURGERY  07/2017  . BUBBLE STUDY  10/31/2018   Procedure: BUBBLE STUDY;  Surgeon: Fay Records, MD;  Location: [facility] ENDOSCOPY;  Service: Cardiovascular;;  . COLON SURGERY     2001 diverticulitis with infection ..drained surgically .  Marland Kitchen EYE SURGERY     cat ext bil   . JOINT REPLACEMENT     right  . KNEE ARTHROSCOPY  11/08/2011  . LOOP RECORDER INSERTION N/A 10/31/2018   Procedure: LOOP RECORDER INSERTION;  Surgeon: Constance Haw, MD;  Location: [facility] CV LAB;  Service:  Cardiovascular;  Laterality: N/A;  . TEE WITHOUT CARDIOVERSION N/A 10/31/2018   Procedure: TRANSESOPHAGEAL ECHOCARDIOGRAM (TEE);  Surgeon: Fay Records, MD;  Location: [facility] ENDOSCOPY;  Service: Cardiovascular;  Laterality: N/A;  . TONSILLECTOMY    . TOTAL KNEE ARTHROPLASTY  11/07/2011   Procedure: TOTAL KNEE ARTHROPLASTY;  Surgeon: Ninetta Lights, MD;  Location: [facility];  Service: Orthopedics;  Laterality: Left;  . TUBAL LIGATION    . WRIST SURGERY     for skin cancer    There were no vitals filed for this visit.  Subjective Assessment - 01/20/19 1151    Subjective  "I didn't understand what it was all about. I'm tired of therapy." (re why here for speech therapy)    Currently in Pain?  No/denies         SLP Evaluation [facility] - 01/20/19 1151      SLP Visit Information   SLP Received On  01/20/19    Referring Provider (SLP)  Reesa Chew, PA-C    Onset Date  10/26/18    Medical Diagnosis  CVA      Subjective   Subjective  "I live alone and I can take care of things by myself."  General Information   HPI  Morgan Boyer is an 85 year old female with history of HTN, GERD, neuropathy (hands/feet), headaches in the past, depression admitted to [facility] 10/26/18 and subsequently CIR for right frontal/parietal CVA. PT, OT, ST on CIR, SLP on CIR felt further ST not indicated as pt was at baseline level of cognitive functioning (prior memory deficits reported). Pt has since completed OP PT and OT.     Behavioral/Cognition  alert, pleasant    Mobility Status  ambulated to session      Balance Screen   Has the patient fallen in the past 6 months  No    Has the patient had a decrease in activity level because of a fear of falling?   No    Is the patient reluctant to leave their home because of a fear of falling?   No      Prior Functional Status   Cognitive/Linguistic Baseline  Baseline deficits    Baseline deficit details  per SLP in rehab, prior memory deficits    Type of [place];Available PRN/intermittently    Vocation  Retired      Associate Professor   Overall Cognitive Status  History of cognitive impairments - at baseline    Attention  Alternating;Divided   difficulty with complex instructions; attn/working memory   Alternating Attention  Impaired    Alternating Attention Impairment  --   trailmaking difficulties   Divided Attention  Impaired    Divided Attention Impairment  Verbal basic    Memory  Impaired    Memory Impairment  Other (comment);Decreased short term memory   working memory   Decreased Short Term Memory  Functional basic    Awareness  Impaired    Awareness Impairment  Emergent impairment;Anticipatory impairment   min-mod cues to locate errors (symbol cancel, clock drawing)   Problem Solving  Impaired    Problem Solving Impairment  Verbal complex    Midwife; difficulty with mazes; correct with ext time   Organizing  Impaired  Numbers counterclockwise on clockdrawing, incorrect hand placement   Behaviors  Other (comment)   appears fatigued after testing     Auditory Comprehension   Overall Auditory Comprehension  Appears within functional limits for tasks assessed      Visual Recognition/Discrimination   Discrimination  Within Function Limits      Reading Comprehension   Reading Status  Within funtional limits      Expression   Primary Mode of Expression  Verbal      Verbal Expression   Overall Verbal Expression  Appears within functional limits for tasks assessed      Written Expression   Dominant Hand  Right    Written Expression  Not tested      Oral Motor/Sensory Function   Overall Oral Motor/Sensory Function  Appears within functional limits for tasks assessed      Motor Speech   Overall Motor Speech  Appears within functional limits for tasks assessed      Standardized Assessments   Standardized Assessments   Cognitive Linguistic Quick Test       Cognitive Linguistic Quick Test (Ages 18-69)   Attention  Moderate    Memory  WNL    Executive Function  Moderate    Language  WNL    Visuospatial Skills  Mild    Severity Rating Total  15    Composite Severity Rating  3 (Mild overall)              Plan - 01/20/19 1318    Clinical Impression Statement  Patient is a pleasant 85 year old female who presents today with overall mild cognitive deficits per Cognitive Linguistic Quick Test. Per patient and chart review, she had mild cognitive deficits at baseline. Pt's deficits are primarily in executive functioning, higher level attention, and working memory. She also had decreased awareness of errors, problem solving skills. Patient denies functional deficits; not losing or misplacing items, missing appointments or medications, no difficulty managing finances. Her children check on her periodically and assist with transportation. Patient denies interest in [place] at this time. SLP educated re: deficit areas and potential impacts, as well role that high level attention skills, executive function play in driving. Advised she follow MD guidance about return to driving. No skilled ST recommended, as pt denies functional deficits and does not wish to pursue additional therapy at this time.    Speech Therapy Frequency  One time visit    Duration  --   one time visit   Treatment/Interventions  SLP instruction and feedback;Compensatory techniques;Patient/family education    Potential to Achieve Goals  --   n/a, patient does not wish to pursue therapy at this time   [place]  memory tips/strategies provided    Consulted and Agree with Plan of Care  Patient       Patient will benefit from skilled therapeutic intervention in order to improve the following deficits and impairments:   Cognitive communication deficit    Problem List Patient Active Problem List   Diagnosis Date Noted  . Hemiparesis affecting left side as late  effect of stroke ([place])   . Neuropathy   . Acute blood loss anemia   . Hypoalbuminemia due to protein-calorie malnutrition ([place])   . Embolic stroke ([place]) [id number]  . Esophageal stricture 10/30/2018  . Family hx-stroke 10/27/2018  . Advanced age 60/27/2020  . 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 Thrombocytopenia ([place])   . Essential hypertension   . Hyperlipidemia   . Hypokalemia   . Lumbar stenosis with neurogenic claudication 07/03/2017  . Near syncope 11/10/2014  . Nocturnal leg cramps 07/15/2014  . Abnormality of gait 07/15/2014  . Polyneuropathy in other diseases classified elsewhere ([place]) 01/14/2013  . Memory changes 01/14/2013   Deneise Lever, [place], [place] 01/20/2019, 6:05 PM  [facility] [address] [place], [place], [address] Phone: [phone]   Fax:  [phone]  Name: Morgan Boyer MRN: [id number] Date of Birth: 1931-07-05

## 2019-01-20 NOTE — Patient Instructions (Signed)

## 2019-01-22 ENCOUNTER — Ambulatory Visit: Payer: PPO | Admitting: Physical Therapy

## 2019-01-23 ENCOUNTER — Ambulatory Visit: Payer: PPO | Admitting: Physical Medicine & Rehabilitation

## 2019-01-26 ENCOUNTER — Encounter: Payer: PPO | Attending: Physical Medicine & Rehabilitation | Admitting: Physical Medicine & Rehabilitation

## 2019-01-26 ENCOUNTER — Other Ambulatory Visit: Payer: Self-pay

## 2019-01-26 ENCOUNTER — Encounter: Payer: Self-pay | Admitting: Physical Medicine & Rehabilitation

## 2019-01-26 VITALS — BP 137/83 | HR 69 | Temp 97.5°F | Ht 62.0 in | Wt 143.0 lb

## 2019-01-26 DIAGNOSIS — I69354 Hemiplegia and hemiparesis following cerebral infarction affecting left non-dominant side: Secondary | ICD-10-CM | POA: Diagnosis not present

## 2019-01-26 NOTE — Patient Instructions (Signed)
Graduated return to driving instructions were provided. It is recommended that the patient first drives with another licensed driver in an empty parking lot. If the patient does well with this, and they can drive on a quiet street with the licensed driver. If the patient does well with this they can drive on a busy street with a licensed driver. If the patient does well with this, the next time out they can go by himself.  I recommend no nighttime or Interstate driving.

## 2019-01-26 NOTE — Progress Notes (Signed)
Subjective:  85 year old female with history of HTN, GERD, neuropathy, HA, depression who was admitted on 07/26/20with sudden onset of left-sided weakness. CTA head/neck was negative for LVO and she received TPA. 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 Dr. Tasia Catchings was concerned about occult A. fib and patient underwent TEE with placement of loop recorder. TEE revealed normal EF of 60 to 65% and was negative for PFO or LAA able masses. Partially flail posterior leaflet of mitral valve with severe mitral regurg noted. Therapy initiated and patient with resultant gait disorder with left inattention as well as deficits in high-level cognition.CIR was recommended due to functional decline  CIR 7/31-8/02/2019  Patient ID: Morgan Boyer, female    DOB: 31-Mar-1932, 85 y.o.   MRN: [id number]  HPI No new vision problems since stroke  No neck issues No numbness or tingling in hands or feet  Modified independent with ADLs  No falls.  Patient living independently. Previous function was driving short distances approximately 2 to 5 miles to go shopping or visit her son Pandora Leiter is with her today does not have any concerns about patient's  potential return to driving Pain Inventory Average Pain 0 Pain Right Now 0 My pain is no pain  In the last 24 hours, has pain interfered with the following? General activity 0 Relation with others 0 Enjoyment of life 0 What TIME of day is your pain at its worst? no [pain Sleep (in general) Fair  Pain is worse with: no pain Pain improves with: no pain Relief from Meds: no pain  Mobility walk without assistance how many minutes can you walk? 15 ability to climb steps?  yes Do you have any goals in this area?  yes  Function not employed: date last employed .  Neuro/Psych No  problems in this area  Prior Studies Any changes since last visit?  no  Physicians involved in your care Any changes since last visit?  no   Family History  Problem Relation Age of Onset  . Congestive Heart Failure Mother   . Stroke Father 28  . Heart attack Brother 44  . Diabetes Brother    Social History   Socioeconomic History  . Marital status: Widowed    Spouse name: Not on file  . Number of children: 3  . Years of education: hs  . Highest education level: Not on file  Occupational History  . Occupation: retired  Scientific laboratory technician  . Financial resource strain: Not hard at all  . Food insecurity    Worry: Never true    Inability: Never true  . Transportation needs    Medical: No    Non-medical: No  Tobacco Use  . Smoking status: Never Smoker  . Smokeless tobacco: Never Used  Substance and Sexual Activity  . Alcohol use: No  . Drug use: No  . Sexual activity: Never  Lifestyle  . Physical activity    Days per week: Patient refused    Minutes per session: Patient refused  . Stress: Not on file  Relationships  . Social Herbalist on phone: Not on file    Gets together: Twice a week    Attends religious service: Never    Active member of club or organization: No    Attends meetings of clubs or organizations: Never  Relationship status: Widowed  Other Topics Concern  . Not on file  Social History Narrative   Husband deceased from lung cancer 2013-06-23.   Patient is right handed.   Patient drinks 2 cups of caffeine daily.   Past Surgical History:  Procedure Laterality Date  . ABDOMINAL HYSTERECTOMY    . APPENDECTOMY    . BACK SURGERY  07/2017  . BUBBLE STUDY  10/31/2018   Procedure: BUBBLE STUDY;  Surgeon: Fay Records, MD;  Location: [facility] ENDOSCOPY;  Service: Cardiovascular;;  . COLON SURGERY     2001 diverticulitis with infection ..drained surgically .  Marland Kitchen EYE SURGERY     cat ext bil   . JOINT REPLACEMENT     right  . KNEE ARTHROSCOPY   11/08/2011  . LOOP RECORDER INSERTION N/A 10/31/2018   Procedure: LOOP RECORDER INSERTION;  Surgeon: Constance Haw, MD;  Location: [place] CV LAB;  Service: Cardiovascular;  Laterality: N/A;  . TEE WITHOUT CARDIOVERSION N/A 10/31/2018   Procedure: TRANSESOPHAGEAL ECHOCARDIOGRAM (TEE);  Surgeon: Fay Records, MD;  Location[facility] ENDOSCOPY;  Service: Cardiovascular;  Laterality: N/A;  . TONSILLECTOMY    . TOTAL KNEE ARTHROPLASTY  11/07/2011   Procedure: TOTAL KNEE ARTHROPLASTY;  Surgeon: Ninetta Lights, MD;  Location: [place];  Service: Orthopedics;  Laterality: Left;  . TUBAL LIGATION    . WRIST SURGERY     for skin cancer   Past Medical History:  Diagnosis Date  . Abnormality of gait   . Adenomatous colon polyp   . Arthritis   . Back pain   . Cancer ([place])    skin cancers  . Depression   . Diverticulosis   . DJD (degenerative joint disease) of knee   . Esophageal reflux   . H/O hiatal hernia   . Heart murmur   . Herpes zoster    throat  . History of shingles    left throat 07/2007  . Hyperlipidemia   . Hypertension   . Memory changes 01/14/2013  . Neuromuscular disorder ([place])    peripheral neuropathy  . Nocturnal leg cramps 07/15/2014  . Peripheral neuropathy   . Polyneuropathy in other diseases classified elsewhere ([place]) 01/14/2013  . Severe mitral regurgitation   . Stroke (cerebrum) (HCC)    BP 137/83   Pulse 69   Temp (!) 97.5 F (36.4 C)   Ht 5\' 2"  (1.575 m)   Wt 143 lb (64.9 kg)   SpO2 95%   BMI 26.16 kg/m   Opioid Risk Score:   Fall Risk Score:  `1  Depression screen PHQ 2/9  No flowsheet data found.  Review of Systems  Constitutional: Negative.   HENT: Negative.   Eyes: Negative.   Respiratory: Negative.   Cardiovascular: Negative.   Gastrointestinal: Negative.   Endocrine: Negative.   Genitourinary: Negative.   Musculoskeletal: Negative.   Skin: Negative.   Allergic/Immunologic: Negative.   Neurological: Negative.   Hematological:  Negative.   Psychiatric/Behavioral: Negative.   All other systems reviewed and are negative.      Objective:   Physical Exam Vitals signs and nursing note reviewed.  Constitutional:      Appearance: Normal appearance.  Eyes:     Extraocular Movements: Extraocular movements intact.     Pupils: Pupils are equal, round, and reactive to light.  Neurological:     General: No focal deficit present.     Mental Status: She is alert and oriented to person, place, and time.  Cranial Nerves: No dysarthria.     Sensory: No sensory deficit.     Coordination: Coordination normal.     Gait: Gait is intact.     Comments: Motor strength is 5/5 bilateral deltoid by stress grip hip flexion extension ankle dorsiflexor Visual fields are intact confrontation testing Extraocular muscles are intact   Psychiatric:        Mood and Affect: Mood normal.        Behavior: Behavior normal.        Thought Content: Thought content normal.           Assessment & Plan:  #1.  Scattered right frontal infarcts, possibly embolic with history of severe mitral regurgitation. Patient will continue home exercise program.  No further formalized therapy needed. Return to driving discussed with patient and son.  Graduated return to driving instructions were provided. It is recommended that the patient first drives with another licensed driver in an empty parking lot. If the patient does well with this, and they can drive on a quiet street with the licensed driver. If the patient does well with this they can drive on a busy street with a licensed driver. If the patient does well with this, the next time out they can go by himself.  I recommend no nighttime or Interstate driving.

## 2019-01-27 ENCOUNTER — Encounter: Payer: PPO | Admitting: Occupational Therapy

## 2019-02-03 ENCOUNTER — Other Ambulatory Visit: Payer: Self-pay

## 2019-02-03 NOTE — Patient Outreach (Signed)
First attempt to obtain mRs. No answer, just kept ringing.

## 2019-02-09 ENCOUNTER — Other Ambulatory Visit: Payer: Self-pay

## 2019-02-09 ENCOUNTER — Ambulatory Visit (INDEPENDENT_AMBULATORY_CARE_PROVIDER_SITE_OTHER): Payer: PPO | Admitting: *Deleted

## 2019-02-09 DIAGNOSIS — I63411 Cerebral infarction due to embolism of right middle cerebral artery: Secondary | ICD-10-CM | POA: Diagnosis not present

## 2019-02-09 NOTE — Telephone Encounter (Signed)
This encounter was created in error - please disregard.

## 2019-02-09 NOTE — Patient Outreach (Signed)
Second attempt to obtain mRs. No answer. Left message for return call. Called home number.

## 2019-02-10 LAB — CUP PACEART REMOTE DEVICE CHECK
Date Time Interrogation Session: 20201107201221
Implantable Pulse Generator Implant Date: 20200731

## 2019-02-13 ENCOUNTER — Other Ambulatory Visit: Payer: Self-pay

## 2019-02-13 NOTE — Patient Outreach (Signed)
3 outreach attempts were completed to obtain mRs. mRs could not be obtained because patient never returned my calls. mRs=7 

## 2019-02-16 ENCOUNTER — Ambulatory Visit (INDEPENDENT_AMBULATORY_CARE_PROVIDER_SITE_OTHER): Payer: PPO | Admitting: Adult Health

## 2019-02-16 ENCOUNTER — Other Ambulatory Visit: Payer: Self-pay

## 2019-02-16 VITALS — BP 124/62 | HR 71 | Temp 97.6°F | Ht 62.0 in | Wt 141.8 lb

## 2019-02-16 DIAGNOSIS — G63 Polyneuropathy in diseases classified elsewhere: Secondary | ICD-10-CM

## 2019-02-16 DIAGNOSIS — G4762 Sleep related leg cramps: Secondary | ICD-10-CM

## 2019-02-16 DIAGNOSIS — Z8673 Personal history of transient ischemic attack (TIA), and cerebral infarction without residual deficits: Secondary | ICD-10-CM | POA: Diagnosis not present

## 2019-02-16 MED ORDER — BACLOFEN 10 MG PO TABS: 10.0000 mg | ORAL_TABLET | Freq: Every day | ORAL | 3 refills | Status: DC

## 2019-02-16 MED ORDER — GABAPENTIN 600 MG PO TABS: 600.0000 mg | ORAL_TABLET | Freq: Every day | ORAL | 3 refills | Status: DC

## 2019-02-16 NOTE — Patient Instructions (Signed)
Your Plan:  Continue gabapentin and Baclofen  Maintain good control of Blood pressure and Cholesterol  Continue plavix If your symptoms worsen or you develop new symptoms please let us know.    Thank you for coming to see [place] at [facility]. I hope we have been able to provide you high quality care today.  You may receive a patient satisfaction survey over the next few weeks. We would appreciate your feedback and comments so that we may continue to improve ourselves and the health of our patients.

## 2019-02-16 NOTE — Progress Notes (Signed)
PATIENT: Morgan Boyer DOB: 05/04/1931  REASON FOR VISIT: follow up HISTORY FROM: patient  HISTORY OF PRESENT ILLNESS: Today 02/16/19: Morgan Boyer is a 85 year old female with a history of peripheral neuropathy and nocturnal leg cramps.  She returns today for follow-up.  She states that her neuropathy is controlled with gabapentin.  She continues to take 600 mg at bedtime.  She denies any changes with her gait or balance.  Denies any falls.  She continues to take baclofen at bedtime and continues to find it beneficial.  The patient reports that she had a right MCA and left posterior MCA infarcts October 26, 2018.  She never followed up with neurology.  She did have a loop recorder placed.  2D echo showed ejection fraction of 60 to 65%-concern for LAA thrombus.  TEE showed severe MR but no LA or LAA thrombus.  LDL was 84, hemoglobin A1c 5.8.  The patient was on aspirin and Plavix but is now only on Plavix.  She has participated in physical therapy.  Reports that she has been discharged and is doing well at home.  She originally went to the hospital for weakness in the left upper extremity.  She states that she has regained some of her strength and movement of the left arm.   HISTORY 02/13/18: Morgan Boyer is a 85 year old female with a history of peripheral neuropathy.  She returns today for follow-up.  She reports that her neuropathy has been controlled with gabapentin.  On occasion she will still have numbness and tingling in the feet and legs.  She states that she did have back surgery April 3 and her back pain has significantly improved.  She is no longer on pain medication.  She reports that baclofen continues to be beneficial for nocturnal muscle cramps.  She denies any falls.  She does not typically use a cane or walker but does report that she has a 3 wheeled Rollator that she sometimes will use at home especially she gets up at night.  Overall she feels that she is doing well.  She returns  today for evaluation.   REVIEW OF SYSTEMS: Out of a complete 14 system review of symptoms, the patient complains only of the following symptoms, and all other reviewed systems are negative.  See HPI  ALLERGIES: Allergies  Allergen Reactions  . Septra [Sulfamethoxazole-Trimethoprim] Hives  . Lotensin [Benazepril Hcl] Other (See Comments)    NOT KNOWN    HOME MEDICATIONS: Outpatient Medications Prior to Visit  Medication Sig Dispense Refill  . acetaminophen (TYLENOL) 325 MG tablet Take 1-2 tablets (325-650 mg total) by mouth every 4 (four) hours as needed for mild pain.    Marland Kitchen amLODipine (NORVASC) 10 MG tablet Take 1 tablet (10 mg total) by mouth daily. 30 tablet 1  . b complex vitamins tablet Take 1 tablet by mouth daily.    . baclofen (LIORESAL) 10 MG tablet Take 1 tablet (10 mg total) by mouth at bedtime. 90 tablet 3  . Calcium Citrate (CITRACAL PO) Take 1 tablet by mouth 2 (two) times daily. + D3    . clopidogrel (PLAVIX) 75 MG tablet Take 1 tablet (75 mg total) by mouth daily. 30 tablet 0  . gabapentin (NEURONTIN) 600 MG tablet Take 1 tablet (600 mg total) by mouth at bedtime. 90 tablet 3  . hydrocortisone 2.5 % cream Apply 1 application topically daily as needed (rash).   11  . lisinopril (ZESTRIL) 20 MG tablet Take 1 tablet (20 mg  total) by mouth 2 (two) times daily. 60 tablet 1  . pantoprazole (PROTONIX) 40 MG tablet Take 1 tablet (40 mg total) by mouth daily. 30 tablet 1  . Polyethyl Glycol-Propyl Glycol (SYSTANE OP) Place 1 drop into both eyes 3 (three) times daily as needed (for dry eyes).     . simvastatin (ZOCOR) 20 MG tablet Take 20 mg by mouth every evening.     No facility-administered medications prior to visit.     PAST MEDICAL HISTORY: Past Medical History:  Diagnosis Date  . Abnormality of gait   . Adenomatous colon polyp   . Arthritis   . Back pain   . Cancer ([place])    skin cancers  . Depression   . Diverticulosis   . DJD (degenerative joint disease) of  knee   . Esophageal reflux   . H/O hiatal hernia   . Heart murmur   . Herpes zoster    throat  . History of shingles    left throat 07/2007  . Hyperlipidemia   . Hypertension   . Memory changes 01/14/2013  . Neuromuscular disorder ([place])    peripheral neuropathy  . Nocturnal leg cramps 07/15/2014  . Peripheral neuropathy   . Polyneuropathy in other diseases classified elsewhere ([place]) 01/14/2013  . Severe mitral regurgitation   . Stroke (cerebrum) ([place])     PAST SURGICAL HISTORY: Past Surgical History:  Procedure Laterality Date  . ABDOMINAL HYSTERECTOMY    . APPENDECTOMY    . BACK SURGERY  07/2017  . BUBBLE STUDY  10/31/2018   Procedure: BUBBLE STUDY;  Surgeon: Fay Records, MD;  Location: [facility] ENDOSCOPY;  Service: Cardiovascular;;  . COLON SURGERY     2001 diverticulitis with infection ..drained surgically .  Marland Kitchen EYE SURGERY     cat ext bil   . JOINT REPLACEMENT     right  . KNEE ARTHROSCOPY  11/08/2011  . LOOP RECORDER INSERTION N/A 10/31/2018   Procedure: LOOP RECORDER INSERTION;  Surgeon: Constance Haw, MD;  Location: [place] CV LAB;  Service: Cardiovascular;  Laterality: N/A;  . TEE WITHOUT CARDIOVERSION N/A 10/31/2018   Procedure: TRANSESOPHAGEAL ECHOCARDIOGRAM (TEE);  Surgeon: Fay Records, MD;  Location: [facility] ENDOSCOPY;  Service: Cardiovascular;  Laterality: N/A;  . TONSILLECTOMY    . TOTAL KNEE ARTHROPLASTY  11/07/2011   Procedure: TOTAL KNEE ARTHROPLASTY;  Surgeon: Ninetta Lights, MD;  Location: [place];  Service: Orthopedics;  Laterality: Left;  . TUBAL LIGATION    . WRIST SURGERY     for skin cancer    FAMILY HISTORY: Family History  Problem Relation Age of Onset  . Congestive Heart Failure Mother   . Stroke Father 62  . Heart attack Brother 4  . Diabetes Brother     SOCIAL HISTORY: Social History   Socioeconomic History  . Marital status: Widowed    Spouse name: Not on file  . Number of children: 3  . Years of education: hs  . Highest  education level: Not on file  Occupational History  . Occupation: retired  Scientific laboratory technician  . Financial resource strain: Not hard at all  . Food insecurity    Worry: Never true    Inability: Never true  . Transportation needs    Medical: No    Non-medical: No  Tobacco Use  . Smoking status: Never Smoker  . Smokeless tobacco: Never Used  Substance and Sexual Activity  . Alcohol use: No  . Drug use: No  . Sexual  activity: Never  Lifestyle  . Physical activity    Days per week: Patient refused    Minutes per session: Patient refused  . Stress: Not on file  Relationships  . Social Herbalist on phone: Not on file    Gets together: Twice a week    Attends religious service: Never    Active member of club or organization: No    Attends meetings of clubs or organizations: Never    Relationship status: Widowed  . Intimate partner violence    Fear of current or ex partner: No    Emotionally abused: No    Physically abused: No    Forced sexual activity: No  Other Topics Concern  . Not on file  Social History Narrative   Husband deceased from lung cancer 07-04-2013.   Patient is right handed.   Patient drinks 2 cups of caffeine daily.      PHYSICAL EXAM  Vitals:   02/16/19 1256  BP: 124/62  Pulse: 71  Temp: 97.6 F (36.4 C)  SpO2: 99%  Weight: 141 lb 12.8 oz (64.3 kg)  Height: 5\' 2"  (1.575 m)   Body mass index is 25.94 kg/m.  Generalized: Well developed, in no acute distress   Neurological examination  Mentation: Alert oriented to time, place, history taking. Follows all commands speech and language fluent Cranial nerve II-XII: Pupils were equal round reactive to light. Extraocular movements were full, visual field were full on confrontational test.  Head turning and shoulder shrug  were normal and symmetric. Motor: The motor testing reveals 5 over 5 strength of all 4 extremities with the exception of 4 out of 5 strength in the left upper extremity.  Good  symmetric motor tone is noted throughout.  Sensory: Sensory testing is intact to soft touch on all 4 extremities. No evidence of extinction is noted.  Coordination: Cerebellar testing reveals good finger-nose-finger and heel-to-shin bilaterally.  Gait and station: Gait is slightly wide-based.  Tandem gait not attempted.   DIAGNOSTIC DATA (LABS, IMAGING, TESTING) - I reviewed patient records, labs, notes, testing and imaging myself where available.  Lab Results  Component Value Date   WBC 5.9 11/05/2018   HGB 11.3 (L) 11/05/2018   HCT 34.1 (L) 11/05/2018   MCV 85.5 11/05/2018   PLT 234 11/05/2018      Component Value Date/Time   NA 141 11/05/2018 0632   K 3.9 11/05/2018 0632   CL 106 11/05/2018 0632   CO2 27 11/05/2018 0632   GLUCOSE 98 11/05/2018 0632   BUN 21 11/05/2018 0632   CREATININE 0.75 11/05/2018 0632   CALCIUM 8.9 11/05/2018 0632   PROT 6.0 (L) 11/01/2018 0421   ALBUMIN 3.1 (L) 11/01/2018 0421   AST 24 11/01/2018 0421   ALT 16 11/01/2018 0421   ALKPHOS 43 11/01/2018 0421   BILITOT 1.1 11/01/2018 0421   GFRNONAA >60 11/05/2018 0632   GFRAA >60 11/05/2018 0632   Lab Results  Component Value Date   CHOL 149 10/26/2018   HDL 55 10/26/2018   LDLCALC 84 10/26/2018   TRIG 52 10/26/2018   CHOLHDL 2.7 10/26/2018   Lab Results  Component Value Date   HGBA1C 5.8 (H) 10/26/2018      ASSESSMENT AND PLAN 85 y.o. year old female  has a past medical history of Abnormality of gait, Adenomatous colon polyp, Arthritis, Back pain, Cancer ([place]), Depression, Diverticulosis, DJD (degenerative joint disease) of knee, Esophageal reflux, H/O hiatal hernia, Heart murmur, Herpes zoster, History  of shingles, Hyperlipidemia, Hypertension, Memory changes (01/14/2013), Neuromuscular disorder ([place]), Nocturnal leg cramps (07/15/2014), Peripheral neuropathy, Polyneuropathy in other diseases classified elsewhere ([place]) (01/14/2013), Severe mitral regurgitation, and Stroke (cerebrum) ([place]).  here with :  1.  Peripheral neuropathy  -Continue gabapentin 600 mg at bedtime  2.  Nocturnal leg cramps  -Continue baclofen 10 mg at bedtime  3.  Right MCA infarct  -Continue Plavix -Strict control of blood pressure with goal less than 130/90 -Strict control LDL with goal less than 70 -Continue with exercises learned by physical therapy -If you develop any strokelike symptoms call 911.  Patient is advised that if her symptoms worsen or she develops new symptoms she should let us know.  She will follow-up in 6 months or sooner if needed.     Ward Givens, MSN, NP-C 02/16/2019, 1:13 PM [facility] [address] [place],  [id number] [phone]

## 2019-02-17 NOTE — Progress Notes (Signed)
I have read the note, and I agree with the clinical assessment and plan.  Charles K Willis   

## 2019-03-02 ENCOUNTER — Telehealth: Payer: Self-pay | Admitting: *Deleted

## 2019-03-02 NOTE — Telephone Encounter (Signed)
LMOM requesting call back to DC. Direct number and office hours given.   LINQ alert received 03/02/19 shows new PAF noted on 03/01/19 at 10:21, duration 1hr 1min. Confirmed true AF with Dr. Lovena Le. Will make patient aware and offer f/u in AF Clinic to discuss [place].

## 2019-03-04 NOTE — Telephone Encounter (Signed)
Second attempt:  LMOM with direct DC phone number and office hours. Attempted cell number, no answer, no VM setup.

## 2019-03-05 NOTE — Telephone Encounter (Signed)
The pt is returning [place] phone call. I let her know that [name], NP tried to call her back but did not receive an answer. The pt asked me what it was about. I told her per [name] phone note she was having some A-fib and she wanted to offer her an appointment with the Atrial Fibrillation clinic. The pt would like someone to call her son and explain things to him. The pt son name is Morgan Boyer and his phone number is [phone]. She would also like a phone call back as well.

## 2019-03-05 NOTE — Telephone Encounter (Signed)
The pt was returning [place], [place] phone call. I told her the nurse will give her a call back to discuss the alert her monitor sent.

## 2019-03-05 NOTE — Telephone Encounter (Signed)
Spoke with patient. Advised of findings. Pt asymptomatic with episode on 03/01/19. Pt is agreeable to AF Clinic f/u to discuss [facility], but will need her son to drive her. Requests that I contact him with an update and schedule appointment with him.  LM on identified VM for pt's son requesting call back to discuss results per pt's request. Direct DC number and office hours provided.

## 2019-03-05 NOTE — Telephone Encounter (Signed)
Attempted to call back, voicemail is full, unable to leave message.  Chanetta Marshall, NP 03/05/2019 12:15 PM

## 2019-03-09 NOTE — Telephone Encounter (Signed)
Called and spoke with pts son, [name], per her request.  He is aware of appt with A-Fib 03/12/2019 at 2:30 with Roderic Palau, NP.

## 2019-03-09 NOTE — Progress Notes (Signed)
[facility] Summary Report / Loop Recorder 

## 2019-03-12 ENCOUNTER — Ambulatory Visit (INDEPENDENT_AMBULATORY_CARE_PROVIDER_SITE_OTHER): Payer: PPO | Admitting: *Deleted

## 2019-03-12 ENCOUNTER — Ambulatory Visit (HOSPITAL_COMMUNITY)
Admission: RE | Admit: 2019-03-12 | Discharge: 2019-03-12 | Disposition: A | Discharge status: Home / Self Care | Payer: PPO | Source: Ambulatory Visit | Attending: Nurse Practitioner | Admitting: Nurse Practitioner

## 2019-03-12 ENCOUNTER — Telehealth: Payer: Self-pay | Admitting: Physician Assistant

## 2019-03-12 ENCOUNTER — Encounter (HOSPITAL_COMMUNITY): Payer: Self-pay | Admitting: Nurse Practitioner

## 2019-03-12 ENCOUNTER — Other Ambulatory Visit: Payer: Self-pay

## 2019-03-12 VITALS — BP 126/72 | HR 66 | Ht 62.0 in | Wt 144.6 lb

## 2019-03-12 DIAGNOSIS — G629 Polyneuropathy, unspecified: Secondary | ICD-10-CM | POA: Insufficient documentation

## 2019-03-12 DIAGNOSIS — Z8673 Personal history of transient ischemic attack (TIA), and cerebral infarction without residual deficits: Secondary | ICD-10-CM | POA: Insufficient documentation

## 2019-03-12 DIAGNOSIS — R600 Localized edema: Secondary | ICD-10-CM | POA: Insufficient documentation

## 2019-03-12 DIAGNOSIS — E785 Hyperlipidemia, unspecified: Secondary | ICD-10-CM | POA: Insufficient documentation

## 2019-03-12 DIAGNOSIS — I63411 Cerebral infarction due to embolism of right middle cerebral artery: Secondary | ICD-10-CM | POA: Diagnosis not present

## 2019-03-12 DIAGNOSIS — I10 Essential (primary) hypertension: Secondary | ICD-10-CM | POA: Diagnosis not present

## 2019-03-12 DIAGNOSIS — D6869 Other thrombophilia: Secondary | ICD-10-CM

## 2019-03-12 DIAGNOSIS — I440 Atrioventricular block, first degree: Secondary | ICD-10-CM | POA: Insufficient documentation

## 2019-03-12 DIAGNOSIS — Z888 Allergy status to other drugs, medicaments and biological substances status: Secondary | ICD-10-CM | POA: Diagnosis not present

## 2019-03-12 DIAGNOSIS — I4891 Unspecified atrial fibrillation: Secondary | ICD-10-CM | POA: Diagnosis not present

## 2019-03-12 DIAGNOSIS — J984 Other disorders of lung: Secondary | ICD-10-CM | POA: Diagnosis not present

## 2019-03-12 DIAGNOSIS — M1990 Unspecified osteoarthritis, unspecified site: Secondary | ICD-10-CM | POA: Diagnosis not present

## 2019-03-12 DIAGNOSIS — Z96652 Presence of left artificial knee joint: Secondary | ICD-10-CM | POA: Diagnosis not present

## 2019-03-12 DIAGNOSIS — Z7901 Long term (current) use of anticoagulants: Secondary | ICD-10-CM | POA: Diagnosis not present

## 2019-03-12 DIAGNOSIS — I444 Left anterior fascicular block: Secondary | ICD-10-CM | POA: Insufficient documentation

## 2019-03-12 DIAGNOSIS — Z8249 Family history of ischemic heart disease and other diseases of the circulatory system: Secondary | ICD-10-CM | POA: Insufficient documentation

## 2019-03-12 LAB — CUP PACEART REMOTE DEVICE CHECK
Date Time Interrogation Session: 20201210151315
Implantable Pulse Generator Implant Date: 20200731

## 2019-03-12 MED ORDER — APIXABAN 5 MG PO TABS: 5.0000 mg | ORAL_TABLET | Freq: Two times a day (BID) | ORAL | 3 refills | Status: DC

## 2019-03-12 NOTE — Patient Instructions (Signed)
Stop plavix   Start Eliquis 5mg twice a day 

## 2019-03-12 NOTE — Progress Notes (Addendum)
Primary Care Physician: Leighton Ruff, MD Referring Physician: Dr. Nechama Guard is a 85 y.o. female with a h/o CVA in July  2020 at which time a linq was implanted. Pt was referred here for a change to a DOAC after an  episode of afib occurring 03/02/19 that lasted one hour 42 mins. Pt was unaware  of the episode. She denies a bleeding history and has been tolerating plavix well.  I had her son on the phone as well so he could hear the change in the medications.  Pt is c/o of LLE since she left the hospital. She is on amlodipine which was started at the time of her stroke which may be contributing to her swelling.   Today, she denies symptoms of palpitations, chest pain, shortness of breath, orthopnea, PND,+ for  lower extremity edema, no  dizziness, presyncope, syncope, or neurologic sequela. The patient is tolerating medications without difficulties and is otherwise without complaint today.   Past Medical History:  Diagnosis Date  . Abnormality of gait   . Adenomatous colon polyp   . Arthritis   . Back pain   . Cancer ([place])    skin cancers  . Depression   . Diverticulosis   . DJD (degenerative joint disease) of knee   . Esophageal reflux   . H/O hiatal hernia   . Heart murmur   . Herpes zoster    throat  . History of shingles    left throat 07/2007  . Hyperlipidemia   . Hypertension   . Memory changes 01/14/2013  . Neuromuscular disorder ([place])    peripheral neuropathy  . Nocturnal leg cramps 07/15/2014  . Peripheral neuropathy   . Polyneuropathy in other diseases classified elsewhere ([place]) 01/14/2013  . Severe mitral regurgitation   . Stroke (cerebrum) [facility])    Past Surgical History:  Procedure Laterality Date  . ABDOMINAL HYSTERECTOMY    . APPENDECTOMY    . BACK SURGERY  07/2017  . BUBBLE STUDY  10/31/2018   Procedure: BUBBLE STUDY;  Surgeon: Fay Records, MD;  Location: [facility] ENDOSCOPY;  Service: Cardiovascular;;  . COLON SURGERY     2001  diverticulitis with infection ..drained surgically .  Marland Kitchen EYE SURGERY     cat ext bil   . JOINT REPLACEMENT     right  . KNEE ARTHROSCOPY  11/08/2011  . LOOP RECORDER INSERTION N/A 10/31/2018   Procedure: LOOP RECORDER INSERTION;  Surgeon: Constance Haw, MD;  Location: [facility] CV LAB;  Service: Cardiovascular;  Laterality: N/A;  . TEE WITHOUT CARDIOVERSION N/A 10/31/2018   Procedure: TRANSESOPHAGEAL ECHOCARDIOGRAM (TEE);  Surgeon: Fay Records, MD;  Location: [facility] ENDOSCOPY;  Service: Cardiovascular;  Laterality: N/A;  . TONSILLECTOMY    . TOTAL KNEE ARTHROPLASTY  11/07/2011   Procedure: TOTAL KNEE ARTHROPLASTY;  Surgeon: Ninetta Lights, MD;  Location: [facility];  Service: Orthopedics;  Laterality: Left;  . TUBAL LIGATION    . WRIST SURGERY     for skin cancer    Current Outpatient Medications  Medication Sig Dispense Refill  . acetaminophen (TYLENOL) 325 MG tablet Take 1-2 tablets (325-650 mg total) by mouth every 4 (four) hours as needed for mild pain.    Marland Kitchen amLODipine (NORVASC) 10 MG tablet Take 1 tablet (10 mg total) by mouth daily. 30 tablet 1  . b complex vitamins tablet Take 1 tablet by mouth daily.    . baclofen (LIORESAL) 10 MG tablet Take 1 tablet (  10 mg total) by mouth at bedtime. 90 tablet 3  . Calcium Citrate (CITRACAL PO) Take 1 tablet by mouth 2 (two) times daily. + D3    . gabapentin (NEURONTIN) 600 MG tablet Take 1 tablet (600 mg total) by mouth at bedtime. 90 tablet 3  . hydrocortisone 2.5 % cream Apply 1 application topically daily as needed (rash).   11  . lisinopril (ZESTRIL) 20 MG tablet Take 1 tablet (20 mg total) by mouth 2 (two) times daily. 60 tablet 1  . pantoprazole (PROTONIX) 40 MG tablet Take 1 tablet (40 mg total) by mouth daily. 30 tablet 1  . Polyethyl Glycol-Propyl Glycol (SYSTANE OP) Place 1 drop into both eyes 3 (three) times daily as needed (for dry eyes).     . simvastatin (ZOCOR) 20 MG tablet Take 20 mg by mouth every evening.    Marland Kitchen apixaban  (ELIQUIS) 5 MG TABS tablet Take 1 tablet (5 mg total) by mouth 2 (two) times daily. 60 tablet 3   No current facility-administered medications for this encounter.    Allergies  Allergen Reactions  . Septra [Sulfamethoxazole-Trimethoprim] Hives  . Lotensin [Benazepril Hcl] Other (See Comments)    NOT KNOWN    Social History   Socioeconomic History  . Marital status: Widowed    Spouse name: Not on file  . Number of children: 3  . Years of education: hs  . Highest education level: Not on file  Occupational History  . Occupation: retired  Tobacco Use  . Smoking status: Never Smoker  . Smokeless tobacco: Never Used  Substance and Sexual Activity  . Alcohol use: No  . Drug use: No  . Sexual activity: Never  Other Topics Concern  . Not on file  Social History Narrative   Husband deceased from lung cancer July 06, 2013.   Patient is right handed.   Patient drinks 2 cups of caffeine daily.   Social Determinants of Health   Financial Resource Strain: Low Risk   . Difficulty of Paying Living Expenses: Not hard at all  Food Insecurity: No Food Insecurity  . Worried About Charity fundraiser in the Last Year: Never true  . Ran Out of Food in the Last Year: Never true  Transportation Needs: No Transportation Needs  . Lack of Transportation (Medical): No  . Lack of Transportation (Non-Medical): No  Physical Activity: Unknown  . Days of Exercise per Week: Patient refused  . Minutes of Exercise per Session: Patient refused  Stress:   . Feeling of Stress : Not on file  Social Connections: Unknown  . Frequency of Communication with Friends and Family: Not on file  . Frequency of Social Gatherings with Friends and Family: Twice a week  . Attends Religious Services: Never  . Active Member of Clubs or Organizations: No  . Attends Archivist Meetings: Never  . Marital Status: Widowed  Intimate Partner Violence: Not At Risk  . Fear of Current or Ex-Partner: No  . Emotionally  Abused: No  . Physically Abused: No  . Sexually Abused: No    Family History  Problem Relation Age of Onset  . Congestive Heart Failure Mother   . Stroke Father 70  . Heart attack Brother 38  . Diabetes Brother     ROS- All systems are reviewed and negative except as per the HPI above  Physical Exam: Vitals:   03/12/19 1444  BP: 126/72  Pulse: 66  Weight: 65.6 kg  Height: 5\' 2"  (1.575 m)  Wt Readings from Last 3 Encounters:  03/12/19 65.6 kg  02/16/19 64.3 kg  01/26/19 64.9 kg    Labs: Lab Results  Component Value Date   NA 141 11/05/2018   K 3.9 11/05/2018   CL 106 11/05/2018   CO2 27 11/05/2018   GLUCOSE 98 11/05/2018   BUN 21 11/05/2018   CREATININE 0.75 11/05/2018   CALCIUM 8.9 11/05/2018   Lab Results  Component Value Date   INR 1.2 10/26/2018   Lab Results  Component Value Date   CHOL 149 10/26/2018   HDL 55 10/26/2018   LDLCALC 84 10/26/2018   TRIG 52 10/26/2018     GEN- The patient is well appearing, alert and oriented x 3 today.   Head- normocephalic, atraumatic Eyes-  Sclera clear, conjunctiva pink Ears- hearing intact Oropharynx- clear Neck- supple, no JVP Lymph- no cervical lymphadenopathy Lungs- Clear to ausculation bilaterally, normal work of breathing Heart- Regular rate and rhythm, no murmurs, rubs or gallops, PMI not laterally displaced GI- soft, NT, ND, + BS Extremities- no clubbing, cyanosis, + for bilateral  edema MS- no significant deformity or atrophy Skin- no rash or lesion Psych- euthymic mood, full affect Neuro- strength and sensation are intact  EKG-Sinus rhythm  with first degree AV block, pr int 218 ms, qrs int  108 ms, qtc 452 ms Device clinic afib strip reviewed   Assessment and Plan: 1. New onset afib  General education re afib Do not feel the need to start rate control at this time as pt was asymptomatic and  burden is low  Contiue monitoring with Linq  2. CHA2DS2VASc of 8 Will stop Plavix and start  eliquis 5 mg bid (age over 13 but weight greater than  60 kg and creatinine  normal at 0.75 Bleeding precautions discussed   3. LLE Pt has noted increase  since d/c from  hospital  and son reports  that she was placed on amlodipine 10 mg daily  at that  time  which may be contributing She also has severe MR and this  also may be contributing She was last seen by Melina Copa in September and told to report any  increase in swelling I will forward my note to [facility] for review   F/u in afib clinic in one month for repeat CBC/bmet   Butch Penny C. Izell Labat, [facility] [address] [place], [place] [address] [phone]

## 2019-03-12 NOTE — Telephone Encounter (Signed)
   OV from [facility] reviewed - patient was reporting increase in lower extremity edema at their visit today. Prior to her admission for stroke in August, she was on HCTZ in addition to her lisinopril. HCTZ was paused during her admission. This was not for any specific reason that I can see - discharge summary from neuro team actually recommended to resume, although it does not appear this was done since blood pressure had been controlled with addition of amlodipine. I agree with Donna's assessment that amlodipine could be contributing. Last BMET in 11/2018 was normal.  I would suggest decreasing amlodipine to 5mg  daily, adding HCTZ 12.5mg  daily + KCl 54meq daily, with CMET 5-7 days after changing meds. (CMET was chosen so that we also f/u albumin, which was low over the summer and can contribute to swelling.) Of note, she was just recently found to have atrial fib on her loop recorder. She was mildly anemic back in 11/2018 so we can add on a CBC when she comes as well as TSH to exclude any contribution towards her new development of atrial fib.  Would also recommend arranging virtual OV after BMET with general cardiology APP to discuss f/u of swelling. Video would be preferred if possible. Will cc to Dr. Angelena Form to make him aware of plan. At time of OV in 12/2018 he recommended medical management of her severe mitral regurgitation. We can see how she is doing upon follow-up and go from there. Please make sure patient knows to call if symptoms do not improve, worsen, or if blood pressure begins to run higher or lower than normal with these changes.  Nicoli Nardozzi PA-C

## 2019-03-12 NOTE — Telephone Encounter (Addendum)
Attempted to call pt:  LM on her Home number No answer on her mobile number  Advised her to call back and ask for triage nurse and if not tonight we will call her again first thing in the morning.

## 2019-03-13 ENCOUNTER — Telehealth: Payer: Self-pay | Admitting: *Deleted

## 2019-03-13 ENCOUNTER — Telehealth: Payer: Self-pay

## 2019-03-13 MED ORDER — AMLODIPINE BESYLATE 5 MG PO TABS: 5.0000 mg | ORAL_TABLET | Freq: Every day | ORAL | 3 refills | Status: DC

## 2019-03-13 MED ORDER — POTASSIUM CHLORIDE CRYS ER 20 MEQ PO TBCR: 20.0000 meq | EXTENDED_RELEASE_TABLET | Freq: Every day | ORAL | 2 refills | Status: DC

## 2019-03-13 MED ORDER — HYDROCHLOROTHIAZIDE 12.5 MG PO TABS: 12.5000 mg | ORAL_TABLET | Freq: Every day | ORAL | 2 refills | Status: DC

## 2019-03-13 NOTE — Telephone Encounter (Signed)
I spoke with pharmacy at [facility] and told them pt should not be taking lisinopril/HCTZ. Pharmacy states pt has not picked it up. They will remove from her profile.  I spoke with pt who confirms she is taking lisinopril and not taking lisinopril/HCTZ

## 2019-03-13 NOTE — Telephone Encounter (Signed)
Pt called in stating that she had just spoken to her pharmacy and reported that the lisinopril-hctz had been sent over as well. Pt stated that she thought this medicine had been discontinued and would like clarification.

## 2019-03-13 NOTE — Telephone Encounter (Signed)
I spoke with pt and confirmed she has lasix 20 mg at home.  She will take lasix 20 mg daily for the next 3 days and call us on Tuesday with an update. Message left on voicemail at [facility] requesting prescriptions for HCTZ and potassium be cancelled. Pt will plan on 12/22 phone visit as scheduled.

## 2019-03-13 NOTE — Telephone Encounter (Signed)
While speaking with pt about medications in previous phone note she told me she read about Eliquis and is not going to take it. I explained to pt this was to help prevent strokes since she was in Atrial fib.  Pt is aware of this but does not want to take Eliquis

## 2019-03-13 NOTE — Telephone Encounter (Signed)
Pat, thanks for the follow-up. Remote health lab draw would work.   The only other option I can think of to avoid having to do labs would be to do amlodipine 5mg  as discussed but instead of HCTZ/KCl, simply do a short term trial of the Lasix she has at home for x 3 days, then call us early next week with an update of how swelling and BP are running. Looks like the previous Lasix dose was 20mg  daily but would need to clarify.   Domanic Matusek PA-C

## 2019-03-13 NOTE — Telephone Encounter (Signed)
Pt called in stating that she had just spoken with Fraser Din and wanted to let her know that she had been put on Eliquis 5mg  BID. Pt would like to know if this will have any adverse reaction with any of her other medications? Please give pt a call back at [phone].

## 2019-03-13 NOTE — Telephone Encounter (Signed)
I spoke with pt and told her we knew she was on Eliquis and she should continue taking this as ordered. I went over previous amlodipine and lasix instructions with her again

## 2019-03-13 NOTE — Telephone Encounter (Signed)
Virtual Visit Pre-Appointment Phone Call  "(Name), I am calling you today to discuss your upcoming appointment. We are currently trying to limit exposure to the virus that causes COVID-19 by seeing patients at home rather than in the office."  1. "What is the BEST phone number to call the day of the visit?" - include this in appointment [phone]  2. "Do you have or have access to (through a family member/friend) a smartphone with video capability that we can use for your visit?" no a. If yes - list this number in appt notes as "cell" (if different from BEST phone #) and list the appointment type as a VIDEO visit in appointment notes b. If no - list the appointment type as a PHONE visit in appointment notes  3. Confirm consent - "In the setting of the current Covid19 crisis, you are scheduled for a phone visit with your provider on 03/24/19.  Just as we do with many in-office visits, in order for you to participate in this visit, we must obtain consent.  If you'd like, I can send this to your mychart (if signed up) or email for you to review.  Otherwise, I can obtain your verbal consent now.  All virtual visits are billed to your insurance company just like a normal visit would be.  By agreeing to a virtual visit, we'd like you to understand that the technology does not allow for your provider to perform an examination, and thus may limit your provider's ability to fully assess your condition. If your provider identifies any concerns that need to be evaluated in person, we will make arrangements to do so.  Finally, though the technology is pretty good, we cannot assure that it will always work on either your or our end, and in the setting of a video visit, we may have to convert it to a phone-only visit.  In either situation, we cannot ensure that we have a secure connection.  Are you willing to proceed?" STAFF: Did the patient verbally acknowledge consent to telehealth visit? Document  YES/NO here: yes  4. Advise patient to be prepared - "Two hours prior to your appointment, go ahead and check your blood pressure, pulse, oxygen saturation, and your weight (if you have the equipment to check those) and write them all down. When your visit starts, your provider will ask you for this information. If you have an Apple Watch or Kardia device, please plan to have heart rate information ready on the day of your appointment. Please have a pen and paper handy nearby the day of the visit as well."  5. Give patient instructions for MyChart download to smartphone OR Doximity/Doxy.me as below if video visit (depending on what platform provider is using)  6. Inform patient they will receive a phone call 15 minutes prior to their appointment time (may be from unknown caller ID) so they should be prepared to answer    TELEPHONE CALL NOTE  Morgan Boyer has been deemed a candidate for a follow-up tele-health visit to limit community exposure during the Covid-19 pandemic. I spoke with the patient via phone to ensure availability of phone/video source, confirm preferred email & phone number, and discuss instructions and expectations.  I reminded Morgan Boyer to be prepared with any vital sign and/or heart rhythm information that could potentially be obtained via home monitoring, at the time of her visit. I reminded Morgan Boyer to expect a phone call prior to her visit.  Leodis Liverpool, RN 03/13/2019 1:15 PM   INSTRUCTIONS FOR DOWNLOADING THE MYCHART APP TO SMARTPHONE  - The patient must first make sure to have activated MyChart and know their login information - If Apple, go to [employer] and type in MyChart in the search bar and download the app. If Android, ask patient to go to [employer] and type in [place] in the search bar and download the app. The app is free but as with any other app downloads, their phone may require them to verify saved payment information or  Apple/Android password.  - The patient will need to then log into the app with their MyChart username and password, and select [facility] as their healthcare provider to link the account. When it is time for your visit, go to the MyChart app, find appointments, and click Begin Video Visit. Be sure to Select Allow for your device to access the Microphone and Camera for your visit. You will then be connected, and your provider will be with you shortly.  **If they have any issues connecting, or need assistance please contact MyChart service desk [phone])**  **If using a computer, in order to ensure the best quality for their visit they will need to use either of the following Internet Browsers: [employer], or Google Chrome**  IF USING DOXIMITY or DOXY.ME - The patient will receive a link just prior to their visit by text.     FULL LENGTH CONSENT FOR TELE-HEALTH VISIT   I hereby voluntarily request, consent and authorize [facility] and its employed or contracted physicians, physician assistants, nurse practitioners or other licensed health care professionals (the Practitioner), to provide me with telemedicine health care services (the "Services") as deemed necessary by the treating Practitioner. I acknowledge and consent to receive the Services by the Practitioner via telemedicine. I understand that the telemedicine visit will involve communicating with the Practitioner through live audiovisual communication technology and the disclosure of certain medical information by electronic transmission. I acknowledge that I have been given the opportunity to request an in-person assessment or other available alternative prior to the telemedicine visit and am voluntarily participating in the telemedicine visit.  I understand that I have the right to withhold or withdraw my consent to the use of telemedicine in the course of my care at any time, without affecting my right to future care  or treatment, and that the Practitioner or I may terminate the telemedicine visit at any time. I understand that I have the right to inspect all information obtained and/or recorded in the course of the telemedicine visit and may receive copies of available information for a reasonable fee.  I understand that some of the potential risks of receiving the Services via telemedicine include:  Marland Kitchen Delay or interruption in medical evaluation due to technological equipment failure or disruption; . Information transmitted may not be sufficient (e.g. poor resolution of images) to allow for appropriate medical decision making by the Practitioner; and/or  . In rare instances, security protocols could fail, causing a breach of personal health information.  Furthermore, I acknowledge that it is my responsibility to provide information about my medical history, conditions and care that is complete and accurate to the best of my ability. I acknowledge that Practitioner's advice, recommendations, and/or decision may be based on factors not within their control, such as incomplete or inaccurate data provided by me or distortions of diagnostic images or specimens that may result from electronic transmissions. I understand that the  practice of medicine is not an Chief Strategy Officer and that Practitioner makes no warranties or guarantees regarding treatment outcomes. I acknowledge that I will receive a copy of this consent concurrently upon execution via email to the email address I last provided but may also request a printed copy by calling the office of [facility].    I understand that my insurance will be billed for this visit.   I have read or had this consent read to me. . I understand the contents of this consent, which adequately explains the benefits and risks of the Services being provided via telemedicine.  . I have been provided ample opportunity to ask questions regarding this consent and the Services and have had  my questions answered to my satisfaction. . I give my informed consent for the services to be provided through the use of telemedicine in my medical care  By participating in this telemedicine visit I agree to the above.

## 2019-03-13 NOTE — Telephone Encounter (Signed)
I spoke with pt and gave her information from [place], [place]. She reports her primary care doctor called earlier and told her to decrease amlodipine to 5 mg daily. No other changes were made. Primary care will be sending this prescription to pharmacy. Pt has prn lasix she takes for swelling. Has not taken for quite awhile.  I told her to put this aside and not take. Will send prescriptions for HCTZ and potassium to [facility] on [address].  She does not feel she can come into office for labs due to transportation issues.  Can only drive a short distance to her primary care doctor and pharmacy.  Will check with Melina Copa, PA to see if remote health could draw lab work. I have scheduled pt for telephone visit with Odie Sera, NP on 12/22 (no openings in [facility]). Pt does not have computer or smart phone so this will be a telephone visit

## 2019-03-16 NOTE — Telephone Encounter (Signed)
Patient son called in and we talked regarding patient's concerns. Eliquis discussed - son states he will talk to her again and will back if further issues but he will make sure she starts eliquis.

## 2019-03-17 ENCOUNTER — Telehealth: Payer: Self-pay | Admitting: Cardiovascular Disease

## 2019-03-17 NOTE — Telephone Encounter (Signed)
Blood pressures reviewed and look fine. If she wants to, she can move the Lasix to as-needed only for now - have a low threshold to resume if ankle swelling returns. Otherwise keep f/u as planned 03/24/19. Thanks! Keyvon Herter

## 2019-03-17 NOTE — Telephone Encounter (Signed)
Placed call to pt.  She has been made aware that Lisbeth Renshaw has looked at her bp readings and they look fine. She will take the Lasix on a prn basis.

## 2019-03-17 NOTE — Telephone Encounter (Signed)
New Message   Pt is calling to leave her BP readings   Sat 12/12 BP 136/74 HR 70 WT 137  Sun 12/13 BP 126/64 HR 69 WT 136.6  Mon 12/14 BP 124/68 HR 63 WT 137  Tues 12/15 BP 123/59 HR 73 WT 137.6    Pt c/o medication issue:  1. Name of Medication: Furosimide   2. How are you currently taking this medication (dosage and times per day)? 20 mg 1 x daily   3. Are you having a reaction (difficulty breathing--STAT)? No   4. What is your medication issue? Pt says she started taking this medication and her swelling in her ankles have gone down a lot. She is wondering If she needs to continue this medication and if so she will need a refill. Pt says she also needs to fill other medications and would like to get everything refilled at the same time

## 2019-03-23 NOTE — Progress Notes (Signed)
Virtual Visit via Telephone Note   This visit type was conducted due to national recommendations for restrictions regarding the COVID-19 Pandemic (e.g. social distancing) in an effort to limit this patient's exposure and mitigate transmission in our community.  Due to her co-morbid illnesses, this patient is at least at moderate risk for complications without adequate follow up.  This format is felt to be most appropriate for this patient at this time.  The patient did not have access to video technology/had technical difficulties with video requiring transitioning to audio format only (telephone).  All issues noted in this document were discussed and addressed.  No physical exam could be performed with this format.  Please refer to the patient's chart for her  consent to telehealth for [facility].  Evaluation Performed:  Follow-up visit  This visit type was conducted due to national recommendations for restrictions regarding the COVID-19 Pandemic (e.g. social distancing).  This format is felt to be most appropriate for this patient at this time.  All issues noted in this document were discussed and addressed.  No physical exam was performed (except for noted visual exam findings with Video Visits).  Please refer to the patient's chart (MyChart message for video visits and phone note for telephone visits) for the patient's consent to telehealth for [facility]  Date:  03/24/2019   ID:  Morgan Boyer, DOB 1931/07/11, MRN [id number]  Patient Location:  [place] [address]   Provider location:     [address] Office [phone] Fax [phone]   PCP:  Leighton Ruff, MD  Cardiologist:  Lauree Chandler, MD  Electrophysiologist:  None   Chief Complaint: Follow-up  History of Present Illness:    Morgan Boyer is a 85 y.o. female who presents via audio/video conferencing for a telehealth  visit today.  Patient verified DOB and address.  The patient does not symptoms concerning for COVID-19 infection (fever, chills, cough, or new SHORTNESS OF BREATH).   She has a past medical history of CVA 10/2018 at which time she had a Linq device implanted.  She had a 1 hour 42-minute episode of atrial fibrillation and was placed on Eliquis and her Plavix was stopped.  When she saw Roderic Palau at the [facility] fib clinic last she complained of lower extremity edema.  She was on amlodipine at that time.  Her note was forwarded to Melina Copa, PA-C for review.  Her chart was reviewed, amlodipine decreased to 5 mg daily, HCTZ 12.5 mg plus potassium 20 mEq daily was added to her medication regimen.  Dr. Angelena Form has recommended medical management of her severe mitral regurgitation.  Her PMH also includes hypertension, hyperlipidemia, mild memory impairment, peripheral neuropathy, osteoarthritis, GERD, depression, syncope in 2016 (unremarkable with event monitor), CVA 10/20/2018, mild chronic LEE.  She is seen virtually today in follow-up and states she feels well.  She has continued her rehab exercises which take her 10 to 15 minutes to complete.  She does these exercises every day and feels like she is regaining her strength.  She does state that she does not do much of her own cooking anymore and that her son brings her most of her food.  She has been using Lasix as needed for lower extremity edema.  She states this is working well and she only uses this medication 1-2 times per week.  She continues to wear self daily and is 139 pounds today.  I will have her come to the office to check a BMP, give her the salty 6 sheet, and have her follow-up with Dr. Angelena Form in 3 months.  She denies chest pain, shortness of breath, lower extremity edema, fatigue, palpitations, melena, hematuria, hemoptysis, diaphoresis, weakness, presyncope, syncope, orthopnea, and PND.   Prior CV studies:   The following studies were  reviewed today:  EKG 03/12/2019 Sinus rhythm with a first-degree AV block pulmonary disease pattern left anterior fascicular block septal infarct undetermined age 13 bpm  Echocardiogram 10/26/2018 IMPRESSIONS    1. The left ventricle has normal systolic function with an ejection fraction of 60-65%. The cavity size was normal. There is moderately increased left ventricular wall thickness. Left ventricular diastolic Doppler parameters are consistent with  pseudonormalization. Elevated left atrial and left ventricular end-diastolic pressures The E/e' is >15. There is incoordinate septal motion. No evidence of left ventricular regional wall motion abnormalities.  2. Left atrial size was moderately dilated.  3. The mitral valve is abnormal. Mild thickening of the mitral valve leaflet. There is mild to moderate mitral annular calcification present. Mitral valve regurgitation is moderate by color flow Doppler.  4. The tricuspid valve is grossly normal. Tricuspid valve regurgitation is moderate.  5. The aortic valve is tricuspid. Moderate calcification of the aortic valve. No stenosis of the aortic valve.  6. The aorta is normal in size and structure.  7. The inferior vena cava was normal in size with <50% respiratory variability.  8. Cannot rule out PFO.  9. 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  Echocardiogram TEE 10/31/2018 IMPRESSIONS    1. The left ventricle has normal systolic function, with an ejection fraction of 60-65%.  2. LA, LAA without masses.  3. NO PFO by color doppler or as tested with injection of agitated saline.  4. The mitral valve is abnormal.  5. Partially flail posterior leaflet of the mitral valve (P2) with severe mitral regurgitation directed anteriorly and around LA.  FINDINGS  Left Ventricle: The left ventricle has normal systolic function, with an ejection fraction of 60-65%. The cavity size was normal.  Right Ventricle: The right ventricle has normal systolic  function.  Left Atrium: LA, LAA without masses.   Interatrial Septum: NO PFO by color doppler or as tested with injection of agitated saline.  Mitral Valve: The mitral valve is abnormal. Partially flail posterior leaflet of the mitral valve (P2) with severe mitral regurgitation directed anteriorly and around LA.  Tricuspid Valve: The tricuspid valve was normal in structure. Tricuspid valve regurgitation is mild by color flow Doppler.  Aortic Valve: The aortic valve is tricuspid Mild thickening of the aortic valve. Aortic valve regurgitation was not visualized by color flow Doppler.  Pulmonic Valve: The pulmonic valve was normal in structure. Pulmonic valve regurgitation is trivial by color flow Doppler.  Aorta: The aortic root is normal in size and structure. Minimal fixed plaquing of the aorta.   Past Medical History:  Diagnosis Date  . Abnormality of gait   . Adenomatous colon polyp   . Arthritis   . Back pain   . Cancer ([place])    skin cancers  . Depression   . Diverticulosis   . DJD (degenerative joint disease) of knee   . Esophageal reflux   . H/O hiatal hernia   . Heart murmur   . Herpes zoster    throat  . History of shingles    left throat 07/2007  . Hyperlipidemia   . Hypertension   .  Memory changes 01/14/2013  . Neuromuscular disorder ([place])    peripheral neuropathy  . Nocturnal leg cramps 07/15/2014  . Peripheral neuropathy   . Polyneuropathy in other diseases classified elsewhere ([place]) 01/14/2013  . Severe mitral regurgitation   . Stroke (cerebrum) [facility])    Past Surgical History:  Procedure Laterality Date  . ABDOMINAL HYSTERECTOMY    . APPENDECTOMY    . BACK SURGERY  07/2017  . BUBBLE STUDY  10/31/2018   Procedure: BUBBLE STUDY;  Surgeon: Fay Records, MD;  Location: [facility] ENDOSCOPY;  Service: Cardiovascular;;  . COLON SURGERY     2001 diverticulitis with infection ..drained surgically .  Marland Kitchen EYE SURGERY     cat ext bil   . JOINT REPLACEMENT      right  . KNEE ARTHROSCOPY  11/08/2011  . LOOP RECORDER INSERTION N/A 10/31/2018   Procedure: LOOP RECORDER INSERTION;  Surgeon: Constance Haw, MD;  Location: [place] [facility] LAB;  Service: Cardiovascular;  Laterality: N/A;  . TEE WITHOUT CARDIOVERSION N/A 10/31/2018   Procedure: TRANSESOPHAGEAL ECHOCARDIOGRAM (TEE);  Surgeon: Fay Records, MD;  Location: [facility] ENDOSCOPY;  Service: Cardiovascular;  Laterality: N/A;  . TONSILLECTOMY    . TOTAL KNEE ARTHROPLASTY  11/07/2011   Procedure: TOTAL KNEE ARTHROPLASTY;  Surgeon: Ninetta Lights, MD;  Location: [place] [facility];  Service: Orthopedics;  Laterality: Left;  . TUBAL LIGATION    . WRIST SURGERY     for skin cancer     Current Meds  Medication Sig  . acetaminophen (TYLENOL) 325 MG tablet Take 1-2 tablets (325-650 mg total) by mouth every 4 (four) hours as needed for mild pain.  Marland Kitchen amLODipine (NORVASC) 5 MG tablet Take 1 tablet (5 mg total) by mouth daily.  Marland Kitchen apixaban (ELIQUIS) 5 MG TABS tablet Take 1 tablet (5 mg total) by mouth 2 (two) times daily.  Marland Kitchen b complex vitamins tablet Take 1 tablet by mouth daily.  . baclofen (LIORESAL) 10 MG tablet Take 1 tablet (10 mg total) by mouth at bedtime.  . Calcium Citrate (CITRACAL PO) Take 1 tablet by mouth 2 (two) times daily. + D3  . furosemide (LASIX) 20 MG tablet Take 20 mg by mouth as needed for fluid.  Marland Kitchen gabapentin (NEURONTIN) 600 MG tablet Take 1 tablet (600 mg total) by mouth at bedtime.  . hydrocortisone 2.5 % cream Apply 1 application topically daily as needed (rash).   Marland Kitchen lisinopril (ZESTRIL) 20 MG tablet Take 1 tablet (20 mg total) by mouth 2 (two) times daily.  . pantoprazole (PROTONIX) 40 MG tablet Take 1 tablet (40 mg total) by mouth daily.  Vladimir Faster Glycol-Propyl Glycol (SYSTANE OP) Place 1 drop into both eyes 3 (three) times daily as needed (for dry eyes).   . simvastatin (ZOCOR) 20 MG tablet Take 20 mg by mouth every evening.     Allergies:   Septra [sulfamethoxazole-trimethoprim] and  Lotensin [benazepril hcl]   Social History   Tobacco Use  . Smoking status: Never Smoker  . Smokeless tobacco: Never Used  Substance Use Topics  . Alcohol use: No  . Drug use: No     Family Hx: The patient's family history includes Congestive Heart Failure in her mother; Diabetes in her brother; Heart attack (age of onset: 65) in her brother; Stroke (age of onset: 17) in her father.  ROS:   Please see the history of present illness.     All other systems reviewed and are negative.   Labs/Other Tests and Data Reviewed:  Recent Labs: 11/01/2018: ALT 16 11/05/2018: BUN 21; Creatinine, Ser 0.75; Hemoglobin 11.3; Platelets 234; Potassium 3.9; Sodium 141   Recent Lipid Panel Lab Results  Component Value Date/Time   CHOL 149 10/26/2018 05:41 AM   TRIG 52 10/26/2018 05:41 AM   HDL 55 10/26/2018 05:41 AM   CHOLHDL 2.7 10/26/2018 05:41 AM   LDLCALC 84 10/26/2018 05:41 AM    Wt Readings from Last 3 Encounters:  03/24/19 139 lb (63 kg)  03/12/19 144 lb 9.6 oz (65.6 kg)  02/16/19 141 lb 12.8 oz (64.3 kg)     Exam:    Vital Signs:  BP 134/69   Pulse 70   Ht 5\' 2"  (1.575 m)   Wt 139 lb (63 kg)   BMI 25.42 kg/m    Well nourished, well developed female in no  acute distress.   ASSESSMENT & PLAN:    1.  Lower extremity edema-no lower extremity edema today.  Continue furosemide 20 mg tablet as needed Lower extremity support stockings Heart healthy low-sodium diet- salty six given Elevate extremities when not active Order BMP  Essential hypertension-BP today 134/69.  Has been well controlled at home. Continue lisinopril 20 mg tablet twice daily Continue amlodipine 5 mg tablet daily Continue furosemide 20 mg tablet as needed Heart healthy low-sodium diet-salty 6 given Increase physical activity as tolerated  Mitral regurgitation-severe.  Medical management recommended by Dr. Angelena Form  Hyperlipidemia-10/26/2018: Cholesterol 149; HDL 55; LDL Cholesterol 84; Triglycerides  52; VLDL 10 Continue Zocor 20 mg tablet every evening Heart healthy low-sodium high-fiber diet Increase physical activity as tolerated    COVID-19 Education: The signs and symptoms of COVID-19 were discussed with the patient and how to seek care for testing (follow up with PCP or arrange E-visit).  The importance of social distancing was discussed today./  Patient Risk:   After full review of this patients clinical status, I feel that they are at least moderate risk at this time.  Time:   Today, I have spent 15 minutes with the patient with telehealth technology discussing hypertension, lower extremity edema, lab work, and physical activity.     Medication Adjustments/Labs and Tests Ordered: Current medicines are reviewed at length with the patient today.  Concerns regarding medicines are outlined above.   Tests Ordered: No orders of the defined types were placed in this encounter.  Medication Changes: No orders of the defined types were placed in this encounter.   Disposition:  in 3 month(s) follow-up with Dr. Angelena Form  Signed, Jossie Ng. [facility] [place] [address] Office [phone] Fax [phone]

## 2019-03-24 ENCOUNTER — Encounter: Payer: Self-pay | Admitting: General Practice

## 2019-03-24 ENCOUNTER — Telehealth (INDEPENDENT_AMBULATORY_CARE_PROVIDER_SITE_OTHER): Payer: PPO | Admitting: General Practice

## 2019-03-24 VITALS — BP 134/69 | HR 70 | Ht 62.0 in | Wt 139.0 lb

## 2019-03-24 DIAGNOSIS — I10 Essential (primary) hypertension: Secondary | ICD-10-CM | POA: Diagnosis not present

## 2019-03-24 DIAGNOSIS — E785 Hyperlipidemia, unspecified: Secondary | ICD-10-CM

## 2019-03-24 DIAGNOSIS — I340 Nonrheumatic mitral (valve) insufficiency: Secondary | ICD-10-CM

## 2019-03-24 DIAGNOSIS — R600 Localized edema: Secondary | ICD-10-CM

## 2019-03-24 NOTE — Patient Instructions (Addendum)
Medication Instructions:  No Changes *If you need a refill on your cardiac medications before your next appointment, please call your pharmacy*  Lab Work: BMET next week  If you have labs (blood work) drawn today and your tests are completely normal, you will receive your results only by: Marland Kitchen MyChart Message (if you have MyChart) OR . A paper copy in the mail If you have any lab test that is abnormal or we need to change your treatment, we will call you to review the results.  Testing/Procedures: None  Follow-Up: At [facility], you and your health needs are our priority.  As part of our continuing mission to provide you with exceptional heart care, we have created designated Provider Care Teams.  These Care Teams include your primary Cardiologist (physician) and Advanced Practice Providers (APPs -  Physician Assistants and Nurse Practitioners) who all work together to provide you with the care you need, when you need it.  Your next appointment:   3 month(s)  The format for your next appointment:   In Person  Provider:   You may see Lauree Chandler, MD , Coletta Memos, NP or one of the following Advanced Practice Providers on your designated Care Team:    Melina Copa, PA-C  Ermalinda Barrios, PA-C  {[facility]                      Other Instructions  Please follow Salty Six dietary recommendations Elevate lower extremities when  In sitting position Please start keeping a diary of your weight daily, measure at the same time each day, preferably after urinating in the morning.   PLEASE PURCHASE AND WEAR COMPRESSION STOCKINGS DAILY AND OFF AT BEDTIME. Compression stockings are elastic socks that squeeze the legs. They help to increase blood flow to the legs and to decrease swelling in the legs from fluid retention, and reduce the chance of developing blood clots in the lower legs.

## 2019-03-25 ENCOUNTER — Telehealth: Payer: Self-pay | Admitting: *Deleted

## 2019-03-25 NOTE — Telephone Encounter (Signed)
I spoke with pt and scheduled her to see Dr Angelena Form on 06/17/19 at 2:00

## 2019-03-25 NOTE — Telephone Encounter (Signed)
Received message patient needed 3 month follow up with Dr. Angelena Form.  I placed call to patient to schedule this appointment and left message to call office.  Pt can be scheduled at 10:40 or 2:00 on June 17, 2019

## 2019-03-30 ENCOUNTER — Telehealth: Payer: Self-pay | Admitting: Emergency Medicine

## 2019-03-30 DIAGNOSIS — I10 Essential (primary) hypertension: Secondary | ICD-10-CM | POA: Diagnosis not present

## 2019-03-30 LAB — BASIC METABOLIC PANEL
BUN/Creatinine Ratio: 15 (ref 12–28)
BUN: 13 mg/dL (ref 8–27)
CO2: 24 mmol/L (ref 20–29)
Calcium: 9.6 mg/dL (ref 8.7–10.3)
Chloride: 102 mmol/L (ref 96–106)
Creatinine, Ser: 0.89 mg/dL (ref 0.57–1.00)
GFR calc Af Amer: 68 mL/min/{1.73_m2} (ref >59)
GFR calc non Af Amer: 59 mL/min/{1.73_m2} — ABNORMAL LOW (ref >59)
Glucose: 100 mg/dL — ABNORMAL HIGH (ref 65–99)
Potassium: 3.8 mmol/L (ref 3.5–5.2)
Sodium: 141 mmol/L (ref 134–144)

## 2019-03-30 NOTE — Telephone Encounter (Addendum)
Received alert for AF with RVR on 03/28/19 that lasted 8 hrs and 2 minutes with a max v-rate of 167 bpm. Patient stated she has not felt well the past 2 days but feels fine now. She reports feeling tired and run down on 12/26 & 03/29/19. She has not had SOB, CP , palpitations or syncope. Full transmission sent by patient and she is now in SR . She had an episode of AF with RVR on 03/29/19 that lasted 1 hr and 54 minutes with a max v-rate of 140 bpm. + Eliquis. Will send note with events to [place] [facility] to follow up in AF Clinic. Patient prefers telemedicine visits if possible. ED precautions given for worsening cardiac condition.

## 2019-04-07 ENCOUNTER — Ambulatory Visit (HOSPITAL_COMMUNITY)
Admission: RE | Admit: 2019-04-07 | Discharge: 2019-04-07 | Disposition: A | Discharge status: Home / Self Care | Payer: PPO | Source: Ambulatory Visit | Attending: Nurse Practitioner | Admitting: Nurse Practitioner

## 2019-04-07 ENCOUNTER — Other Ambulatory Visit: Payer: Self-pay

## 2019-04-07 DIAGNOSIS — I480 Paroxysmal atrial fibrillation: Secondary | ICD-10-CM | POA: Diagnosis not present

## 2019-04-07 DIAGNOSIS — D6869 Other thrombophilia: Secondary | ICD-10-CM

## 2019-04-07 DIAGNOSIS — I4891 Unspecified atrial fibrillation: Secondary | ICD-10-CM

## 2019-04-07 DIAGNOSIS — Z7901 Long term (current) use of anticoagulants: Secondary | ICD-10-CM | POA: Diagnosis not present

## 2019-04-07 MED ORDER — METOPROLOL TARTRATE 25 MG PO TABS: 12.5000 mg | ORAL_TABLET | Freq: Two times a day (BID) | ORAL | 2 refills | Status: DC

## 2019-04-07 NOTE — Progress Notes (Signed)
Electrophysiology TeleHealth Note   Due to national recommendations of social distancing due to [place] 19, Audio  telehealth visit is felt to be most appropriate for this patient at this time.  See MyChart message/consent below from today for patient consent regarding telehealth for the [facility].    Date:  04/07/2019   ID:  Morgan Boyer, DOB 1932/01/23, MRN [id number]  Location: home  Provider location: [address] [place], [place] [address] Evaluation Performed:  Follow up   PCP:  Leighton Ruff, MD  Primary Cardiologist:  Dr. Julianne Handler Primary Electrophysiologist:  Dr. Curt Bears   CC Increased afib burden per device clinic with RVR   History of Present Illness: Morgan Boyer is a 86 y.o. female who presents via audio/video conferencing for a telehealth visit today.   The patient is referred for f/u for increased paroxysmal  afib burden by device clinic with v rates up to 140 bpm.  Pt for the most part does not feel most of the episodes, occasionally will feel heart racing and feels tired the next day. I saw her about one month ago and she was started on eliquis for new onset afib. . She id doing  well taking this. Her BP's at home usually run upper 110's  to 120. Her heart rate  is in the 60's by her readings.   Today, she denies symptoms of palpitations, chest pain, shortness of breath, orthopnea, PND, lower extremity edema, claudication, dizziness, presyncope, syncope, bleeding, or neurologic sequela. The patient is tolerating medications without difficulties and is otherwise without complaint today.   she denies symptoms of cough, fevers, chills, or new SOB worrisome for COVID 19.    she has a BMI of There is no height or weight on file to calculate BMI.. There were no vitals filed for this visit.  Past Medical History:  Diagnosis Date  . Abnormality of gait   . Adenomatous colon polyp   . Arthritis   . Back pain   . Cancer ([place])    skin cancers    . Depression   . Diverticulosis   . DJD (degenerative joint disease) of knee   . Esophageal reflux   . H/O hiatal hernia   . Heart murmur   . Herpes zoster    throat  . History of shingles    left throat 07/2007  . Hyperlipidemia   . Hypertension   . Memory changes 01/14/2013  . Neuromuscular disorder ([place])    peripheral neuropathy  . Nocturnal leg cramps 07/15/2014  . Peripheral neuropathy   . Polyneuropathy in other diseases classified elsewhere ([place]) 01/14/2013  . Severe mitral regurgitation   . Stroke (cerebrum) [facility])    Past Surgical History:  Procedure Laterality Date  . ABDOMINAL HYSTERECTOMY    . APPENDECTOMY    . BACK SURGERY  07/2017  . BUBBLE STUDY  10/31/2018   Procedure: BUBBLE STUDY;  Surgeon: Fay Records, MD;  Location: [facility] ENDOSCOPY;  Service: Cardiovascular;;  . COLON SURGERY     2001 diverticulitis with infection ..drained surgically .  Marland Kitchen EYE SURGERY     cat ext bil   . JOINT REPLACEMENT     right  . KNEE ARTHROSCOPY  11/08/2011  . LOOP RECORDER INSERTION N/A 10/31/2018   Procedure: LOOP RECORDER INSERTION;  Surgeon: Constance Haw, MD;  Location: [place] CV LAB;  Service: Cardiovascular;  Laterality: N/A;  . TEE WITHOUT CARDIOVERSION N/A 10/31/2018   Procedure: TRANSESOPHAGEAL ECHOCARDIOGRAM (TEE);  Surgeon: Fay Records, MD;  Location: [facility] ENDOSCOPY;  Service: Cardiovascular;  Laterality: N/A;  . TONSILLECTOMY    . TOTAL KNEE ARTHROPLASTY  11/07/2011   Procedure: TOTAL KNEE ARTHROPLASTY;  Surgeon: Ninetta Lights, MD;  Location: [place];  Service: Orthopedics;  Laterality: Left;  . TUBAL LIGATION    . WRIST SURGERY     for skin cancer     Current Outpatient Medications  Medication Sig Dispense Refill  . acetaminophen (TYLENOL) 325 MG tablet Take 1-2 tablets (325-650 mg total) by mouth every 4 (four) hours as needed for mild pain.    Marland Kitchen apixaban (ELIQUIS) 5 MG TABS tablet Take 1 tablet (5 mg total) by mouth 2 (two) times daily. 60 tablet 3  . b  complex vitamins tablet Take 1 tablet by mouth daily.    . baclofen (LIORESAL) 10 MG tablet Take 1 tablet (10 mg total) by mouth at bedtime. 90 tablet 3  . Calcium Citrate (CITRACAL PO) Take 1 tablet by mouth 2 (two) times daily. + D3    . furosemide (LASIX) 20 MG tablet Take 20 mg by mouth as needed for fluid.    Marland Kitchen gabapentin (NEURONTIN) 600 MG tablet Take 1 tablet (600 mg total) by mouth at bedtime. 90 tablet 3  . hydrocortisone 2.5 % cream Apply 1 application topically daily as needed (rash).   11  . lisinopril (ZESTRIL) 20 MG tablet Take 1 tablet (20 mg total) by mouth 2 (two) times daily. 60 tablet 1  . metoprolol tartrate (LOPRESSOR) 25 MG tablet Take 0.5 tablets (12.5 mg total) by mouth 2 (two) times daily. 90 tablet 2  . pantoprazole (PROTONIX) 40 MG tablet Take 1 tablet (40 mg total) by mouth daily. 30 tablet 1  . Polyethyl Glycol-Propyl Glycol (SYSTANE OP) Place 1 drop into both eyes 3 (three) times daily as needed (for dry eyes).     . simvastatin (ZOCOR) 20 MG tablet Take 20 mg by mouth every evening.     No current facility-administered medications for this encounter.    Allergies:   Septra [sulfamethoxazole-trimethoprim] and Lotensin [benazepril hcl]   Social History:  The patient  reports that she has never smoked. She has never used smokeless tobacco. She reports that she does not drink alcohol or use drugs.   Family History:  The patient's  family history includes Congestive Heart Failure in her mother; Diabetes in her brother; Heart attack (age of onset: 46) in her brother; Stroke (age of onset: 42) in her father.    ROS:  Please see the history of present illness.   All other systems are personally reviewed and negative.   Exam: NA/ phone visit   Recent Labs: 11/01/2018: ALT 16 11/05/2018: Hemoglobin 11.3; Platelets 234 03/30/2019: BUN 13; Creatinine, Ser 0.89; Potassium 3.8; Sodium 141  personally reviewed    Other studies personally reviewed:  Epic records/device  notes reviewed    ASSESSMENT AND PLAN:  1.Paroxymal  atrial fibrillation Will start metoprolol 25 mg 1/2 tab bid  Will stop amlodipine so that BP will not get soft She will check daily BP's/HR"s at home  Continue   monitoring thru device clinic    This patients CHA2DS2-VASc Score and unadjusted Ischemic Stroke Rate (% per year) is equal to 9.7 % stroke rate/year from a score of 6 Continue eliquis 5 mg bid    COVID screen The patient does not have any symptoms that suggest any further testing/ screening at this time.  Social distancing reinforced today.  Follow-up: in real person visit for CBC since starting anticoagulation  and BP/EKG check in 2 weeks    Labs/ tests ordered today include: bmet reviewed form 12/28 No orders of the defined types were placed in this encounter.   Patient Risk:  after full review of this patients clinical status, I feel that they are at mod  risk at this time.   Today, I have spent 13 minutes with the patient with telehealth technology discussing afib management .    Eduard Roux NP  04/07/2019 3:19 PM  Afib [facility] [address] [place], [place] [address] [phone]   I hereby voluntarily request, consent and authorize the [facility] and its employed or contracted physicians, physician assistants, nurse practitioners or other licensed health care professionals (the Practitioner), to provide me with telemedicine health care services (the "Services") as deemed necessary by the treating Practitioner. I acknowledge and consent to receive the Services by the Practitioner via telemedicine. I understand that the telemedicine visit will involve communicating with the Practitioner through live audiovisual communication technology and the disclosure of certain medical information by electronic transmission. I acknowledge that I have been given the opportunity to request an in-person assessment or other  available alternative prior to the telemedicine visit and am voluntarily participating in the telemedicine visit.   I understand that I have the right to withhold or withdraw my consent to the use of telemedicine in the course of my care at any time, without affecting my right to future care or treatment, and that the Practitioner or I may terminate the telemedicine visit at any time. I understand that I have the right to inspect all information obtained and/or recorded in the course of the telemedicine visit and may receive copies of available information for a reasonable fee.  I understand that some of the potential risks of receiving the Services via telemedicine include:   Delay or interruption in medical evaluation due to technological equipment failure or disruption;  Information transmitted may not be sufficient (e.g. poor resolution of images) to allow for appropriate medical decision making by the Practitioner; and/or  In rare instances, security protocols could fail, causing a breach of personal health information.   Furthermore, I acknowledge that it is my responsibility to provide information about my medical history, conditions and care that is complete and accurate to the best of my ability. I acknowledge that Practitioner's advice, recommendations, and/or decision may be based on factors not within their control, such as incomplete or inaccurate data provided by me or distortions of diagnostic images or specimens that may result from electronic transmissions. I understand that the practice of medicine is not an exact science and that Practitioner makes no warranties or guarantees regarding treatment outcomes. I acknowledge that I will receive a copy of this consent concurrently upon execution via email to the email address I last provided but may also request a printed copy by calling the office of the [facility].  I understand that my insurance will be billed for this visit.     I have read or had this consent read to me.  I understand the contents of this consent, which adequately explains the benefits and risks of the Services being provided via telemedicine.  I have been provided ample opportunity to ask questions regarding this consent and the Services and have had my questions answered to my satisfaction.  I give my informed consent for the services to be provided through the  use of telemedicine in my medical care  By participating in this telemedicine visit I agree to the above.   Geroge Baseman Teressa Mcglocklin, [facility] [address] [place], [place] [address] [phone]

## 2019-04-13 ENCOUNTER — Ambulatory Visit (HOSPITAL_COMMUNITY): Payer: PPO | Admitting: Nurse Practitioner

## 2019-04-14 ENCOUNTER — Ambulatory Visit (INDEPENDENT_AMBULATORY_CARE_PROVIDER_SITE_OTHER): Payer: PPO | Admitting: *Deleted

## 2019-04-14 DIAGNOSIS — I6389 Other cerebral infarction: Secondary | ICD-10-CM

## 2019-04-14 LAB — CUP PACEART REMOTE DEVICE CHECK
Date Time Interrogation Session: 20210112153729
Implantable Pulse Generator Implant Date: 20200731

## 2019-04-14 NOTE — Progress Notes (Signed)
ILR remote 

## 2019-04-21 ENCOUNTER — Other Ambulatory Visit: Payer: Self-pay

## 2019-04-21 ENCOUNTER — Ambulatory Visit (HOSPITAL_COMMUNITY)
Admission: RE | Admit: 2019-04-21 | Discharge: 2019-04-21 | Disposition: A | Discharge status: Home / Self Care | Payer: PPO | Source: Ambulatory Visit | Attending: Nurse Practitioner | Admitting: Nurse Practitioner

## 2019-04-21 VITALS — BP 140/74 | HR 61 | Ht 62.0 in | Wt 144.2 lb

## 2019-04-21 DIAGNOSIS — Z8673 Personal history of transient ischemic attack (TIA), and cerebral infarction without residual deficits: Secondary | ICD-10-CM | POA: Insufficient documentation

## 2019-04-21 DIAGNOSIS — Z7901 Long term (current) use of anticoagulants: Secondary | ICD-10-CM | POA: Insufficient documentation

## 2019-04-21 DIAGNOSIS — M549 Dorsalgia, unspecified: Secondary | ICD-10-CM | POA: Diagnosis not present

## 2019-04-21 DIAGNOSIS — D6869 Other thrombophilia: Secondary | ICD-10-CM | POA: Diagnosis not present

## 2019-04-21 DIAGNOSIS — K219 Gastro-esophageal reflux disease without esophagitis: Secondary | ICD-10-CM | POA: Insufficient documentation

## 2019-04-21 DIAGNOSIS — I10 Essential (primary) hypertension: Secondary | ICD-10-CM | POA: Diagnosis not present

## 2019-04-21 DIAGNOSIS — R011 Cardiac murmur, unspecified: Secondary | ICD-10-CM | POA: Diagnosis not present

## 2019-04-21 DIAGNOSIS — E785 Hyperlipidemia, unspecified: Secondary | ICD-10-CM | POA: Diagnosis not present

## 2019-04-21 DIAGNOSIS — M1990 Unspecified osteoarthritis, unspecified site: Secondary | ICD-10-CM | POA: Diagnosis not present

## 2019-04-21 DIAGNOSIS — Z79899 Other long term (current) drug therapy: Secondary | ICD-10-CM | POA: Diagnosis not present

## 2019-04-21 DIAGNOSIS — M1710 Unilateral primary osteoarthritis, unspecified knee: Secondary | ICD-10-CM | POA: Insufficient documentation

## 2019-04-21 DIAGNOSIS — I480 Paroxysmal atrial fibrillation: Secondary | ICD-10-CM | POA: Diagnosis not present

## 2019-04-21 LAB — BASIC METABOLIC PANEL
Anion gap: 10 (ref 5–15)
BUN: 14 mg/dL (ref 8–23)
CO2: 24 mmol/L (ref 22–32)
Calcium: 9.2 mg/dL (ref 8.9–10.3)
Chloride: 106 mmol/L (ref 98–111)
Creatinine, Ser: 0.84 mg/dL (ref 0.44–1.00)
GFR calc Af Amer: >60 mL/min (ref >60)
GFR calc non Af Amer: >60 mL/min (ref >60)
Glucose, Bld: 92 mg/dL (ref 70–99)
Potassium: 3.8 mmol/L (ref 3.5–5.1)
Sodium: 140 mmol/L (ref 135–145)

## 2019-04-21 LAB — CBC
HCT: 36.5 % (ref 36.0–46.0)
Hemoglobin: 11.8 g/dL — ABNORMAL LOW (ref 12.0–15.0)
MCH: 28.9 pg (ref 26.0–34.0)
MCHC: 32.3 g/dL (ref 30.0–36.0)
MCV: 89.2 fL (ref 80.0–100.0)
Platelets: 177 10*3/uL (ref 150–400)
RBC: 4.09 MIL/uL (ref 3.87–5.11)
RDW: 13.4 % (ref 11.5–15.5)
WBC: 7.1 10*3/uL (ref 4.0–10.5)
nRBC: 0 % (ref 0.0–0.2)

## 2019-04-21 NOTE — Progress Notes (Signed)
Primary Care Physician: Leighton Ruff, MD Referring Physician: Dr. Nechama Guard is a 86 y.o. female with a h/o CVA in July  2020 at which time a linq was implanted. Pt was referred here for a change to a DOAC after an  episode of afib occurring 03/02/19 that lasted one hour 42 mins. Pt was unaware  of the episode. She denies a bleeding history and has been tolerating plavix well.  I had her son on the phone as well so he could hear the change in the medications.  Pt is c/o of LLE since she left the hospital. She is on amlodipine which was started at the time of her stroke which may be contributing to her swelling.   F/u in afib clinic 04/21/19. I talked to her last with a virtual visit for management of her afib which the device clinic made me aware of with RVR. She has tolerated the addition of BB and eliquis well. Her Linq  does not show any further afib. She has not noted any "bad spells" of heart racing.  Today, she denies symptoms of palpitations, chest pain, shortness of breath, orthopnea, PND, for  lower extremity edema, no  dizziness, presyncope, syncope, or neurologic sequela. The patient is tolerating medications without difficulties and is otherwise without complaint today.   Past Medical History:  Diagnosis Date  . Abnormality of gait   . Adenomatous colon polyp   . Arthritis   . Back pain   . Cancer ([place])    skin cancers  . Depression   . Diverticulosis   . DJD (degenerative joint disease) of knee   . Esophageal reflux   . H/O hiatal hernia   . Heart murmur   . Herpes zoster    throat  . History of shingles    left throat 07/2007  . Hyperlipidemia   . Hypertension   . Memory changes 01/14/2013  . Neuromuscular disorder ([place])    peripheral neuropathy  . Nocturnal leg cramps 07/15/2014  . Peripheral neuropathy   . Polyneuropathy in other diseases classified elsewhere ([place]) 01/14/2013  . Severe mitral regurgitation   . Stroke (cerebrum) [facility])     Past Surgical History:  Procedure Laterality Date  . ABDOMINAL HYSTERECTOMY    . APPENDECTOMY    . BACK SURGERY  07/2017  . BUBBLE STUDY  10/31/2018   Procedure: BUBBLE STUDY;  Surgeon: Fay Records, MD;  Location: [facility] ENDOSCOPY;  Service: Cardiovascular;;  . COLON SURGERY     2001 diverticulitis with infection ..drained surgically .  Marland Kitchen EYE SURGERY     cat ext bil   . JOINT REPLACEMENT     right  . KNEE ARTHROSCOPY  11/08/2011  . LOOP RECORDER INSERTION N/A 10/31/2018   Procedure: LOOP RECORDER INSERTION;  Surgeon: Constance Haw, MD;  Location: [place] CV LAB;  Service: Cardiovascular;  Laterality: N/A;  . TEE WITHOUT CARDIOVERSION N/A 10/31/2018   Procedure: TRANSESOPHAGEAL ECHOCARDIOGRAM (TEE);  Surgeon: Fay Records, MD;  Location: [facility] ENDOSCOPY;  Service: Cardiovascular;  Laterality: N/A;  . TONSILLECTOMY    . TOTAL KNEE ARTHROPLASTY  11/07/2011   Procedure: TOTAL KNEE ARTHROPLASTY;  Surgeon: Ninetta Lights, MD;  Location: [place];  Service: Orthopedics;  Laterality: Left;  . TUBAL LIGATION    . WRIST SURGERY     for skin cancer    Current Outpatient Medications  Medication Sig Dispense Refill  . acetaminophen (TYLENOL) 325 MG tablet Take 1-2 tablets (  325-650 mg total) by mouth every 4 (four) hours as needed for mild pain.    Marland Kitchen apixaban (ELIQUIS) 5 MG TABS tablet Take 1 tablet (5 mg total) by mouth 2 (two) times daily. 60 tablet 3  . b complex vitamins tablet Take 1 tablet by mouth daily.    . baclofen (LIORESAL) 10 MG tablet Take 1 tablet (10 mg total) by mouth at bedtime. 90 tablet 3  . Calcium Citrate (CITRACAL PO) Take 1 tablet by mouth 2 (two) times daily. + D3    . diclofenac Sodium (VOLTAREN) 1 % GEL as needed.     . furosemide (LASIX) 20 MG tablet Take 20 mg by mouth every other day.     . gabapentin (NEURONTIN) 600 MG tablet Take 1 tablet (600 mg total) by mouth at bedtime. 90 tablet 3  . hydrocortisone 2.5 % cream Apply 1 application topically daily as needed  (rash).   11  . lisinopril (ZESTRIL) 20 MG tablet Take 1 tablet (20 mg total) by mouth 2 (two) times daily. 60 tablet 1  . MELATONIN PO Take 10 mg by mouth daily in the afternoon. Takes one gummy daily    . metoprolol tartrate (LOPRESSOR) 25 MG tablet Take 0.5 tablets (12.5 mg total) by mouth 2 (two) times daily. 90 tablet 2  . pantoprazole (PROTONIX) 40 MG tablet Take 1 tablet (40 mg total) by mouth daily. 30 tablet 1  . Polyethyl Glycol-Propyl Glycol (SYSTANE OP) Place 1 drop into both eyes 3 (three) times daily as needed (for dry eyes).     . simvastatin (ZOCOR) 20 MG tablet Take 20 mg by mouth every evening.     No current facility-administered medications for this encounter.    Allergies  Allergen Reactions  . Septra [Sulfamethoxazole-Trimethoprim] Hives  . Lotensin [Benazepril Hcl] Other (See Comments)    NOT KNOWN    Social History   Socioeconomic History  . Marital status: Widowed    Spouse name: Not on file  . Number of children: 3  . Years of education: hs  . Highest education level: Not on file  Occupational History  . Occupation: retired  Tobacco Use  . Smoking status: Never Smoker  . Smokeless tobacco: Never Used  Substance and Sexual Activity  . Alcohol use: No  . Drug use: No  . Sexual activity: Never  Other Topics Concern  . Not on file  Social History Narrative   Husband deceased from lung cancer 06-10-13.   Patient is right handed.   Patient drinks 2 cups of caffeine daily.   Social Determinants of Health   Financial Resource Strain: Low Risk   . Difficulty of Paying Living Expenses: Not hard at all  Food Insecurity: No Food Insecurity  . Worried About Charity fundraiser in the Last Year: Never true  . Ran Out of Food in the Last Year: Never true  Transportation Needs: No Transportation Needs  . Lack of Transportation (Medical): No  . Lack of Transportation (Non-Medical): No  Physical Activity: Unknown  . Days of Exercise per Week: Patient  refused  . Minutes of Exercise per Session: Patient refused  Stress:   . Feeling of Stress : Not on file  Social Connections: Unknown  . Frequency of Communication with Friends and Family: Not on file  . Frequency of Social Gatherings with Friends and Family: Twice a week  . Attends Religious Services: Never  . Active Member of Clubs or Organizations: No  . Attends Club or  Organization Meetings: Never  . Marital Status: Widowed  Intimate Partner Violence: Not At Risk  . Fear of Current or Ex-Partner: No  . Emotionally Abused: No  . Physically Abused: No  . Sexually Abused: No    Family History  Problem Relation Age of Onset  . Congestive Heart Failure Mother   . Stroke Father 53  . Heart attack Brother 38  . Diabetes Brother     ROS- All systems are reviewed and negative except as per the HPI above  Physical Exam: Vitals:   04/21/19 1440  BP: 140/74  Pulse: 61  Weight: 65.4 kg  Height: 5\' 2"  (1.575 m)   Wt Readings from Last 3 Encounters:  04/21/19 65.4 kg  03/24/19 63 kg  03/12/19 65.6 kg    Labs: Lab Results  Component Value Date   NA 140 04/21/2019   K 3.8 04/21/2019   CL 106 04/21/2019   CO2 24 04/21/2019   GLUCOSE 92 04/21/2019   BUN 14 04/21/2019   CREATININE 0.84 04/21/2019   CALCIUM 9.2 04/21/2019   Lab Results  Component Value Date   INR 1.2 10/26/2018   Lab Results  Component Value Date   CHOL 149 10/26/2018   HDL 55 10/26/2018   LDLCALC 84 10/26/2018   TRIG 52 10/26/2018     GEN- The patient is well appearing, alert and oriented x 3 today.   Head- normocephalic, atraumatic Eyes-  Sclera clear, conjunctiva pink Ears- hearing intact Oropharynx- clear Neck- supple, no JVP Lymph- no cervical lymphadenopathy Lungs- Clear to ausculation bilaterally, normal work of breathing Heart- Regular rate and rhythm, no murmurs, rubs or gallops, PMI not laterally displaced GI- soft, NT, ND, + BS Extremities- no clubbing, cyanosis, + for bilateral   edema MS- no significant deformity or atrophy Skin- no rash or lesion Psych- euthymic mood, full affect Neuro- strength and sensation are intact  EKG-Sinus rhythm  with first degree AV block, pr int 244 ms, qrs int 110  ms, qtc 465 ms,   Linq reviewed   Assessment and Plan: 1. Paroxysmal afib  Continue metoprolol 12.5 mg bid  Contiue monitoring with Linq Cbc/bmet  2. CHA2DS2VASc of 6 Off Plavix and continue eliquis 5 mg bid (age over 6 but weight greater than  60 kg and creatinine less than 1.5 Bleeding precautions discussed   3. LLE Stable  Off amlodipine with start of BB   F/u with Dr. Angelena Form  06/19/19 afib clinic as needed   Butch Penny C. Toren Tucholski, [facility] [address] [place], [place] [address] [phone]

## 2019-04-29 ENCOUNTER — Encounter (HOSPITAL_COMMUNITY): Payer: Self-pay | Admitting: Nurse Practitioner

## 2019-05-08 ENCOUNTER — Telehealth (HOSPITAL_COMMUNITY): Payer: Self-pay | Admitting: *Deleted

## 2019-05-08 NOTE — Telephone Encounter (Signed)
Patient called in stating her weight is up 4lbs and having lower extremity swelling. Will increase lasix to 20mg  daily through weekend and call on Monday with update of weight and symptoms.  Pt in agreement.

## 2019-05-14 ENCOUNTER — Other Ambulatory Visit (HOSPITAL_COMMUNITY): Payer: Self-pay | Admitting: *Deleted

## 2019-05-14 MED ORDER — LISINOPRIL 20 MG PO TABS: 20.0000 mg | ORAL_TABLET | Freq: Two times a day (BID) | ORAL | 2 refills | Status: DC

## 2019-05-18 ENCOUNTER — Ambulatory Visit (INDEPENDENT_AMBULATORY_CARE_PROVIDER_SITE_OTHER): Payer: PPO | Admitting: *Deleted

## 2019-05-18 DIAGNOSIS — I6389 Other cerebral infarction: Secondary | ICD-10-CM | POA: Diagnosis not present

## 2019-05-18 LAB — CUP PACEART REMOTE DEVICE CHECK
Date Time Interrogation Session: 20210214235541
Implantable Pulse Generator Implant Date: 20200731

## 2019-05-19 NOTE — Progress Notes (Signed)
ILR Remote 

## 2019-06-01 ENCOUNTER — Telehealth (HOSPITAL_COMMUNITY): Payer: Self-pay | Admitting: *Deleted

## 2019-06-01 MED ORDER — FUROSEMIDE 20 MG PO TABS: 20.0000 mg | ORAL_TABLET | Freq: Every day | ORAL | 3 refills | Status: DC

## 2019-06-01 NOTE — Telephone Encounter (Signed)
Patients son Morgan Boyer called in stating patient is having continued issues with lower extremity swelling. Currently taking lasix 20mg  every other day but swelling persistent. Pt has history of severe MR - per Roderic Palau NP - have patient's general cardiology team address. Son can be reached best at [phone]

## 2019-06-01 NOTE — Telephone Encounter (Signed)
I spoke with Morgan Boyer and gave him information from Dr Angelena Form.  Will send new prescription to [facility] on [address].

## 2019-06-01 NOTE — Telephone Encounter (Signed)
I would have her take Lasix 40 mg per day for three days then back down to 20 mg per day. I see her in two weeks. Her renal function is normal. Thanks, chris

## 2019-06-15 ENCOUNTER — Ambulatory Visit (INDEPENDENT_AMBULATORY_CARE_PROVIDER_SITE_OTHER): Payer: PPO | Admitting: *Deleted

## 2019-06-15 DIAGNOSIS — I6389 Other cerebral infarction: Secondary | ICD-10-CM | POA: Diagnosis not present

## 2019-06-17 ENCOUNTER — Ambulatory Visit (INDEPENDENT_AMBULATORY_CARE_PROVIDER_SITE_OTHER): Payer: PPO | Admitting: Cardiovascular Disease

## 2019-06-17 ENCOUNTER — Other Ambulatory Visit: Payer: Self-pay

## 2019-06-17 ENCOUNTER — Encounter: Payer: Self-pay | Admitting: Cardiovascular Disease

## 2019-06-17 VITALS — BP 130/82 | HR 63 | Ht 62.0 in | Wt 141.0 lb

## 2019-06-17 DIAGNOSIS — I5033 Acute on chronic diastolic (congestive) heart failure: Secondary | ICD-10-CM | POA: Diagnosis not present

## 2019-06-17 DIAGNOSIS — I340 Nonrheumatic mitral (valve) insufficiency: Secondary | ICD-10-CM | POA: Diagnosis not present

## 2019-06-17 DIAGNOSIS — I480 Paroxysmal atrial fibrillation: Secondary | ICD-10-CM | POA: Diagnosis not present

## 2019-06-17 MED ORDER — FUROSEMIDE 20 MG PO TABS: 40.0000 mg | ORAL_TABLET | Freq: Every day | ORAL | 3 refills | Status: DC

## 2019-06-17 NOTE — Patient Instructions (Signed)
Medication Instructions:  Your physician has recommended you make the following change in your medication:  1.) increase furosemide to 40 mg --2 pills instead of one- daily  *If you need a refill on your cardiac medications before your next appointment, please call your pharmacy*   Lab Work: none If you have labs (blood work) drawn today and your tests are completely normal, you will receive your results only by: Marland Kitchen MyChart Message (if you have MyChart) OR . A paper copy in the mail If you have any lab test that is abnormal or we need to change your treatment, we will call you to review the results.   Testing/Procedures: Your physician has requested that you have an echocardiogram. Echocardiography is a painless test that uses sound waves to create images of your heart. It provides your doctor with information about the size and shape of your heart and how well your heart's chambers and valves are working. This procedure takes approximately one hour. There are no restrictions for this procedure.     Follow-Up: At [facility], you and your health needs are our priority.  As part of our continuing mission to provide you with exceptional heart care, we have created designated Provider Care Teams.  These Care Teams include your primary Cardiologist (physician) and Advanced Practice Providers (APPs -  Physician Assistants and Nurse Practitioners) who all work together to provide you with the care you need, when you need it.   Your next appointment:   6 month(s)  The format for your next appointment:   In Person  Provider:   Lauree Chandler, MD   Other Instructions Eat a few bananas every week to help keep potassium in normal levels.

## 2019-06-17 NOTE — Progress Notes (Signed)
Chief Complaint  Patient presents with  . Follow-up    atrial fibrillation   History of Present Illness: 86 yo female with history of HTN, HLD, memory disorder, peripheral neuropathy, OA GERD, depression, CVA, atrial fibrillation and moderate to severe mitral regurgitation who here today for cardiac follow up. I saw her as a new patient in 2016 for evaluation of dizziness, near syncope.  She described episodes of dizziness occurring at rest and associated with nausea and diarrhea. No syncope. She had been seen by Neurology and EEG was ok. MRI Brain with chronic changes but no acute stroke. Carotid dopplers with mild bilateral stenosis. Event monitor with sinus rhythm and 1st degree AV block but no other arrhythmias or high grade heart block. Echo December 2018 with RTMY=11-17%, grade 2 diastolic dysfunction. Moderate mitral regurgitation. She had chronic back pain and leg pain due to spinal stenosis but this resolved after her surgery April 2019. She was admitted to [facility] 10/26/2018 with LUE weakness suddenly and she was given tPA for acute CVA. There was evidence of right MCA and left posterior MCA infarcts - cardioembolic pattern - highly concerning for occult afib. She underwent echo showing EF 60-65%, pseudonormalization, moderate LAE, moderate mitral regurgitation/tricuspid regurgitation, cannot rule out PFO, aneurysmal interatrial septum. There was a possible mobile density in the LAA area so she underwent TEE 10/31/18 showing EF 60-65%, no masses of LAA, no PFO, partially flail leaflet of the mitral valve with severe mitral regurgitation.  She had a loop recorder implanted by EP. Neurology recommended ASA 81 mg daily and plavix daily for 3 weeks then plavix alone. She was found to have atrial fibrillation and was started on Eliquis. She has been followed in the atrial fib clinic.   She is here today for follow up. The patient denies any chest pain, dyspnea, palpitations,  orthopnea, PND, dizziness,  near syncope or syncope. She has more LE edema.   Primary Care Physician: Leighton Ruff, MD  Past Medical History:  Diagnosis Date  . Abnormality of gait   . Adenomatous colon polyp   . Arthritis   . Atrial fibrillation ([place])   . Back pain   . Cancer ([place])    skin cancers  . Depression   . Diverticulosis   . DJD (degenerative joint disease) of knee   . Esophageal reflux   . H/O hiatal hernia   . Heart murmur   . Herpes zoster    throat  . History of shingles    left throat 07/2007  . Hyperlipidemia   . Hypertension   . Memory changes 01/14/2013  . Neuromuscular disorder ([place])    peripheral neuropathy  . Nocturnal leg cramps 07/15/2014  . Peripheral neuropathy   . Polyneuropathy in other diseases classified elsewhere ([place]) 01/14/2013  . Severe mitral regurgitation   . Stroke (cerebrum) [facility])     Past Surgical History:  Procedure Laterality Date  . ABDOMINAL HYSTERECTOMY    . APPENDECTOMY    . BACK SURGERY  07/2017  . BUBBLE STUDY  10/31/2018   Procedure: BUBBLE STUDY;  Surgeon: Fay Records, MD;  Location: [facility] ENDOSCOPY;  Service: Cardiovascular;;  . COLON SURGERY     2001 diverticulitis with infection ..drained surgically .  Marland Kitchen EYE SURGERY     cat ext bil   . JOINT REPLACEMENT     right  . KNEE ARTHROSCOPY  11/08/2011  . LOOP RECORDER INSERTION N/A 10/31/2018   Procedure: LOOP RECORDER INSERTION;  Surgeon: Constance Haw,  MD;  Location: [place] CV LAB;  Service: Cardiovascular;  Laterality: N/A;  . TEE WITHOUT CARDIOVERSION N/A 10/31/2018   Procedure: TRANSESOPHAGEAL ECHOCARDIOGRAM (TEE);  Surgeon: Fay Records, MD;  Location: [facility] ENDOSCOPY;  Service: Cardiovascular;  Laterality: N/A;  . TONSILLECTOMY    . TOTAL KNEE ARTHROPLASTY  11/07/2011   Procedure: TOTAL KNEE ARTHROPLASTY;  Surgeon: Ninetta Lights, MD;  Location: [place];  Service: Orthopedics;  Laterality: Left;  . TUBAL LIGATION    . WRIST SURGERY     for skin cancer    Current Outpatient  Medications  Medication Sig Dispense Refill  . acetaminophen (TYLENOL) 325 MG tablet Take 1-2 tablets (325-650 mg total) by mouth every 4 (four) hours as needed for mild pain.    Marland Kitchen apixaban (ELIQUIS) 5 MG TABS tablet Take 1 tablet (5 mg total) by mouth 2 (two) times daily. 60 tablet 3  . b complex vitamins tablet Take 1 tablet by mouth daily.    . baclofen (LIORESAL) 10 MG tablet Take 1 tablet (10 mg total) by mouth at bedtime. 90 tablet 3  . Calcium Citrate (CITRACAL PO) Take 1 tablet by mouth 2 (two) times daily. + D3    . diclofenac Sodium (VOLTAREN) 1 % GEL as needed.     . furosemide (LASIX) 20 MG tablet Take 2 tablets (40 mg total) by mouth daily. 180 tablet 3  . gabapentin (NEURONTIN) 600 MG tablet Take 1 tablet (600 mg total) by mouth at bedtime. 90 tablet 3  . hydrocortisone 2.5 % cream Apply 1 application topically daily as needed (rash).   11  . lisinopril (ZESTRIL) 20 MG tablet Take 1 tablet (20 mg total) by mouth 2 (two) times daily. 180 tablet 2  . MELATONIN PO Take 10 mg by mouth daily in the afternoon. Takes one gummy daily    . metoprolol tartrate (LOPRESSOR) 25 MG tablet Take 0.5 tablets (12.5 mg total) by mouth 2 (two) times daily. 90 tablet 2  . pantoprazole (PROTONIX) 40 MG tablet Take 1 tablet (40 mg total) by mouth daily. 30 tablet 1  . Polyethyl Glycol-Propyl Glycol (SYSTANE OP) Place 1 drop into both eyes 3 (three) times daily as needed (for dry eyes).     . simvastatin (ZOCOR) 20 MG tablet Take 20 mg by mouth every evening.     No current facility-administered medications for this visit.    Allergies  Allergen Reactions  . Septra [Sulfamethoxazole-Trimethoprim] Hives  . Lotensin [Benazepril Hcl] Other (See Comments)    NOT KNOWN    Social History   Socioeconomic History  . Marital status: Widowed    Spouse name: Not on file  . Number of children: 3  . Years of education: hs  . Highest education level: Not on file  Occupational History  . Occupation:  retired  Tobacco Use  . Smoking status: Never Smoker  . Smokeless tobacco: Never Used  Substance and Sexual Activity  . Alcohol use: No  . Drug use: No  . Sexual activity: Never  Other Topics Concern  . Not on file  Social History Narrative   Husband deceased from lung cancer 07/07/2013.   Patient is right handed.   Patient drinks 2 cups of caffeine daily.   Social Determinants of Health   Financial Resource Strain: Low Risk   . Difficulty of Paying Living Expenses: Not hard at all  Food Insecurity: No Food Insecurity  . Worried About Charity fundraiser in the Last Year: Never true  .  Ran Out of Food in the Last Year: Never true  Transportation Needs: No Transportation Needs  . Lack of Transportation (Medical): No  . Lack of Transportation (Non-Medical): No  Physical Activity: Unknown  . Days of Exercise per Week: Patient refused  . Minutes of Exercise per Session: Patient refused  Stress:   . Feeling of Stress :   Social Connections: Unknown  . Frequency of Communication with Friends and Family: Not on file  . Frequency of Social Gatherings with Friends and Family: Twice a week  . Attends Religious Services: Never  . Active Member of Clubs or Organizations: No  . Attends Archivist Meetings: Never  . Marital Status: Widowed  Intimate Partner Violence: Not At Risk  . Fear of Current or Ex-Partner: No  . Emotionally Abused: No  . Physically Abused: No  . Sexually Abused: No    Family History  Problem Relation Age of Onset  . Congestive Heart Failure Mother   . Stroke Father 36  . Heart attack Brother 47  . Diabetes Brother     Review of Systems:  As stated in the HPI and otherwise negative.   BP 130/82   Pulse 63   Ht _0  (1.575 m)   Wt 141 lb (64 kg)   SpO2 99%   BMI 25.79 kg/m   Physical Examination:  General: Well developed, well nourished, NAD  HEENT: OP clear, mucus membranes moist  SKIN: warm, dry. No rashes. Neuro: No focal deficits    Musculoskeletal: Muscle strength 5/5 all ext  Psychiatric: Mood and affect normal  Neck: No JVD, no carotid bruits, no thyromegaly, no lymphadenopathy.  Lungs:Clear bilaterally, no wheezes, rhonci, crackles Cardiovascular: Regular rate and rhythm. Loud systolic murmur.  Abdomen:Soft. Bowel sounds present. Non-tender.  Extremities: 2+ bilateral lower extremity edema. Pulses are 2 + in the bilateral DP/PT.  Echo July 2020: 1. The left ventricle has normal systolic function with an ejection  fraction of 60-65%. The cavity size was normal. There is moderately  increased left ventricular wall thickness. Left ventricular diastolic  Doppler parameters are consistent with  pseudonormalization. Elevated left atrial and left ventricular  end-diastolic pressures The E/e' is >15. There is incoordinate septal  motion. No evidence of left ventricular regional wall motion  abnormalities.  2. Left atrial size was moderately dilated.  3. The mitral valve is abnormal. Mild thickening of the mitral valve  leaflet. There is mild to moderate mitral annular calcification present.  Mitral valve regurgitation is moderate by color flow Doppler.  4. The tricuspid valve is grossly normal. Tricuspid valve regurgitation  is moderate.  5. The aortic valve is tricuspid. Moderate calcification of the aortic  valve. No stenosis of the aortic valve.  6. The aorta is normal in size and structure.  7. The inferior vena cava was normal in size with <50% respiratory  variability.  8. Cannot rule out PFO.  9. 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   SUMMARY    LVEF 60-65%, moderate LVH, normal wall motion, grade 2 DD, elevated  LV filling pressure, MAC with moderate MR, moderate LAE, moderate  TR, RVSP 72 mmHg (moderate to severe pulmonary hypertension), IVC  non-dilated but does not collapse, there is an atrial septal  aneurysm (cannot exclude PFO) - although not well-visualized, there  appears to be a  mobile density in the left atrium in the area of the  appendage that may be thrombus in the left atrial appendage (seen in  the parasternal long-axis  view). Recommend TEE in the setting of  stroke to better visualize LAA and interatrial septum. I was asked  to read this study on 7/28 as it was discovered this morning to be  unread over the weekend (performed on 7/26). I have contacted Dr. Erlinda Hong  with neurology to relay the findings at 11:34 am on 7/28  TEE 10/31/18: 1. The left ventricle has normal systolic function, with an ejection  fraction of 60-65%.  2. LA, LAA without masses.  3. NO PFO by color doppler or as tested with injection of agitated  saline.  4. The mitral valve is abnormal.  5. Partially flail posterior leaflet of the mitral valve (P2) with severe  mitral regurgitation directed anteriorly and around LA.  EKG:  EKG is not ordered today. The ekg ordered today demonstrates   Recent Labs: 11/01/2018: ALT 16 04/21/2019: BUN 14; Creatinine, Ser 0.84; Hemoglobin 11.8; Platelets 177; Potassium 3.8; Sodium 140     Wt Readings from Last 3 Encounters:  06/17/19 141 lb (64 kg)  04/21/19 144 lb 3.2 oz (65.4 kg)  03/24/19 139 lb (63 kg)     Other studies Reviewed: Additional studies/ records that were reviewed today include: . Review of the above records demonstrates:    Assessment and Plan:   1. Mitral regurgitation: Severe MR by echo and TEE July 2020. She has no dyspnea but she does have LE edema. Will increase lasix to 40 mg daily. She will use an extra 20 of Lasix several days per week. Will repeat an echo now.   2. Paroxysmal atrial fibrillation: Sinus today. Continue Eliquis and Metoprolol.   3. Acute on chronic diastolic CHF: Weight stable but LE edema on exam. Will increase Lasix to 40 mg daily. She will use extra lasix as above. She will increase her dietary intake of bananas and potassium rich foods with increased dose of Lasix.   Current medicines are  reviewed at length with the patient today.  The patient does not have concerns regarding medicines.  The following changes have been made:  no change  Labs/ tests ordered today include:   Orders Placed This Encounter  Procedures  . EKG 12-Lead  . ECHOCARDIOGRAM COMPLETE    Disposition:   FU with me in 12  months  Signed, Lauree Chandler, MD 06/17/2019 2:52 PM    [facility] [address], [place], [place]  [address] Phone: [phone]; Fax: [phone]

## 2019-06-18 LAB — CUP PACEART REMOTE DEVICE CHECK
Date Time Interrogation Session: 20210318013334
Implantable Pulse Generator Implant Date: 20200731

## 2019-06-18 NOTE — Progress Notes (Signed)
ILR Remote 

## 2019-06-19 ENCOUNTER — Other Ambulatory Visit (HOSPITAL_COMMUNITY): Payer: Self-pay | Admitting: Nurse Practitioner

## 2019-06-19 NOTE — Telephone Encounter (Signed)
Pt last saw Dr Angelena Form 06/17/19, last labs 04/21/19 Creat 0.84, age 86, weight 64kg, based on specified criteria pt is on appropriate dosage of Eliquis 5mg  BID.  Will refill rx.

## 2019-06-23 DIAGNOSIS — I480 Paroxysmal atrial fibrillation: Secondary | ICD-10-CM | POA: Diagnosis not present

## 2019-06-23 DIAGNOSIS — M069 Rheumatoid arthritis, unspecified: Secondary | ICD-10-CM | POA: Diagnosis not present

## 2019-06-23 DIAGNOSIS — I10 Essential (primary) hypertension: Secondary | ICD-10-CM | POA: Diagnosis not present

## 2019-06-23 DIAGNOSIS — E7800 Pure hypercholesterolemia, unspecified: Secondary | ICD-10-CM | POA: Diagnosis not present

## 2019-06-23 DIAGNOSIS — F339 Major depressive disorder, recurrent, unspecified: Secondary | ICD-10-CM | POA: Diagnosis not present

## 2019-07-07 ENCOUNTER — Other Ambulatory Visit (HOSPITAL_COMMUNITY): Payer: PPO

## 2019-07-16 ENCOUNTER — Ambulatory Visit (INDEPENDENT_AMBULATORY_CARE_PROVIDER_SITE_OTHER): Payer: PPO | Admitting: *Deleted

## 2019-07-16 DIAGNOSIS — I6389 Other cerebral infarction: Secondary | ICD-10-CM | POA: Diagnosis not present

## 2019-07-20 LAB — CUP PACEART REMOTE DEVICE CHECK
Date Time Interrogation Session: 20210418015558
Implantable Pulse Generator Implant Date: 20200731

## 2019-07-20 NOTE — Progress Notes (Signed)
ILR Remote 

## 2019-07-22 ENCOUNTER — Other Ambulatory Visit: Payer: Self-pay

## 2019-07-22 ENCOUNTER — Ambulatory Visit (HOSPITAL_COMMUNITY): Payer: PPO | Attending: Cardiology

## 2019-07-22 DIAGNOSIS — I340 Nonrheumatic mitral (valve) insufficiency: Secondary | ICD-10-CM | POA: Diagnosis not present

## 2019-07-22 DIAGNOSIS — I5033 Acute on chronic diastolic (congestive) heart failure: Secondary | ICD-10-CM

## 2019-07-22 DIAGNOSIS — I480 Paroxysmal atrial fibrillation: Secondary | ICD-10-CM | POA: Insufficient documentation

## 2019-08-17 ENCOUNTER — Ambulatory Visit (INDEPENDENT_AMBULATORY_CARE_PROVIDER_SITE_OTHER): Payer: PPO | Admitting: *Deleted

## 2019-08-17 ENCOUNTER — Ambulatory Visit: Payer: PPO | Admitting: Adult Health

## 2019-08-17 DIAGNOSIS — I6389 Other cerebral infarction: Secondary | ICD-10-CM | POA: Diagnosis not present

## 2019-08-19 ENCOUNTER — Ambulatory Visit: Payer: PPO | Admitting: Adult Health

## 2019-08-19 LAB — CUP PACEART REMOTE DEVICE CHECK
Date Time Interrogation Session: 20210519015932
Implantable Pulse Generator Implant Date: 20200731

## 2019-08-19 NOTE — Progress Notes (Signed)
[facility] Summary Report / Loop Recorder 

## 2019-09-21 ENCOUNTER — Ambulatory Visit (INDEPENDENT_AMBULATORY_CARE_PROVIDER_SITE_OTHER): Payer: PPO | Admitting: *Deleted

## 2019-09-21 DIAGNOSIS — I6389 Other cerebral infarction: Secondary | ICD-10-CM | POA: Diagnosis not present

## 2019-09-21 LAB — CUP PACEART REMOTE DEVICE CHECK
Date Time Interrogation Session: 20210621032405
Implantable Pulse Generator Implant Date: 20200731

## 2019-09-21 NOTE — Progress Notes (Signed)
[facility] Summary Report / Loop Recorder 

## 2019-10-20 DIAGNOSIS — G629 Polyneuropathy, unspecified: Secondary | ICD-10-CM | POA: Diagnosis not present

## 2019-10-20 DIAGNOSIS — R7303 Prediabetes: Secondary | ICD-10-CM | POA: Diagnosis not present

## 2019-10-20 DIAGNOSIS — I480 Paroxysmal atrial fibrillation: Secondary | ICD-10-CM | POA: Diagnosis not present

## 2019-10-20 DIAGNOSIS — I10 Essential (primary) hypertension: Secondary | ICD-10-CM | POA: Diagnosis not present

## 2019-10-20 DIAGNOSIS — K219 Gastro-esophageal reflux disease without esophagitis: Secondary | ICD-10-CM | POA: Diagnosis not present

## 2019-10-20 DIAGNOSIS — E7800 Pure hypercholesterolemia, unspecified: Secondary | ICD-10-CM | POA: Diagnosis not present

## 2019-10-22 ENCOUNTER — Encounter: Payer: Self-pay | Admitting: Adult Health

## 2019-10-22 ENCOUNTER — Ambulatory Visit: Payer: PPO | Admitting: Adult Health

## 2019-10-22 ENCOUNTER — Other Ambulatory Visit: Payer: Self-pay

## 2019-10-22 VITALS — BP 116/72 | HR 58 | Ht 62.0 in | Wt 138.0 lb

## 2019-10-22 DIAGNOSIS — Z8673 Personal history of transient ischemic attack (TIA), and cerebral infarction without residual deficits: Secondary | ICD-10-CM | POA: Diagnosis not present

## 2019-10-22 DIAGNOSIS — G4762 Sleep related leg cramps: Secondary | ICD-10-CM

## 2019-10-22 DIAGNOSIS — G63 Polyneuropathy in diseases classified elsewhere: Secondary | ICD-10-CM | POA: Diagnosis not present

## 2019-10-22 MED ORDER — GABAPENTIN 600 MG PO TABS: 600.0000 mg | ORAL_TABLET | Freq: Every day | ORAL | 3 refills | Status: DC

## 2019-10-22 MED ORDER — BACLOFEN 10 MG PO TABS: 10.0000 mg | ORAL_TABLET | Freq: Every day | ORAL | 3 refills | Status: DC

## 2019-10-22 NOTE — Progress Notes (Signed)
I have read the note, and I agree with the clinical assessment and plan.  Alisia Vanengen K Ayo Smoak   

## 2019-10-22 NOTE — Patient Instructions (Signed)
Your Plan:  Continue Gabapentin and baclofen  If your symptoms worsen or you develop new symptoms please let us know.    Thank you for coming to see [place] at [facility]. I hope we have been able to provide you high quality care today.  You may receive a patient satisfaction survey over the next few weeks. We would appreciate your feedback and comments so that we may continue to improve ourselves and the health of our patients.

## 2019-10-22 NOTE — Progress Notes (Signed)
PATIENT: Morgan Boyer DOB: [birth year]  REASON FOR VISIT: follow up HISTORY FROM: patient  HISTORY OF PRESENT ILLNESS: Today 10/22/19:  Morgan Boyer is an 86 year old female with a history of peripheral neuropathy and nocturnal leg cramps.  She returns today for follow-up.  Overall she feels that she is doing well.  She continues to take gabapentin and baclofen at bedtime.  Reports that this is helpful for neuropathy and muscle cramps.  She denies any strokelike symptoms.  She continues to have regular follow-ups with her PCP.  She returns today for an evaluation.  HISTORY  02/16/19: Morgan Boyer is a 86 year old female with a history of peripheral neuropathy and nocturnal leg cramps.  She returns today for follow-up.  She states that her neuropathy is controlled with gabapentin.  She continues to take 600 mg at bedtime.  She denies any changes with her gait or balance.  Denies any falls.  She continues to take baclofen at bedtime and continues to find it beneficial.  The patient reports that she had a right MCA and left posterior MCA infarcts October 26, 2018.  She never followed up with neurology.  She did have a loop recorder placed.  2D echo showed ejection fraction of 60 to 65%-concern for LAA thrombus.  TEE showed severe MR but no LA or LAA thrombus.  LDL was 84, hemoglobin A1c 5.8.  The patient was on aspirin and Plavix but is now only on Plavix.  She has participated in physical therapy.  Reports that she has been discharged and is doing well at home.  She originally went to the hospital for weakness in the left upper extremity.  She states that she has regained some of her strength and movement of the left arm.  REVIEW OF SYSTEMS: Out of a complete 14 system review of symptoms, the patient complains only of the following symptoms, and all other reviewed systems are negative.  See HPI  ALLERGIES: Allergies  Allergen Reactions  . Septra [Sulfamethoxazole-Trimethoprim] Hives  . Lotensin  [Benazepril Hcl] Other (See Comments)    NOT KNOWN    HOME MEDICATIONS: Outpatient Medications Prior to Visit  Medication Sig Dispense Refill  . acetaminophen (TYLENOL) 325 MG tablet Take 1-2 tablets (325-650 mg total) by mouth every 4 (four) hours as needed for mild pain.    Marland Kitchen b complex vitamins tablet Take 1 tablet by mouth daily.    . baclofen (LIORESAL) 10 MG tablet Take 1 tablet (10 mg total) by mouth at bedtime. 90 tablet 3  . Calcium Citrate (CITRACAL PO) Take 1 tablet by mouth 2 (two) times daily. + D3    . diclofenac Sodium (VOLTAREN) 1 % GEL as needed.     Marland Kitchen ELIQUIS 5 MG TABS tablet Take 1 tablet by mouth twice daily 60 tablet 6  . furosemide (LASIX) 20 MG tablet Take 2 tablets (40 mg total) by mouth daily. 180 tablet 3  . gabapentin (NEURONTIN) 600 MG tablet Take 1 tablet (600 mg total) by mouth at bedtime. 90 tablet 3  . hydrocortisone 2.5 % cream Apply 1 application topically daily as needed (rash).   11  . lisinopril (ZESTRIL) 20 MG tablet Take 1 tablet (20 mg total) by mouth 2 (two) times daily. 180 tablet 2  . MELATONIN PO Take 5 mg by mouth daily in the afternoon. Takes one gummy daily     . pantoprazole (PROTONIX) 40 MG tablet Take 1 tablet (40 mg total) by mouth daily. 30 tablet 1  .  Polyethyl Glycol-Propyl Glycol (SYSTANE OP) Place 1 drop into both eyes 3 (three) times daily as needed (for dry eyes).     . simvastatin (ZOCOR) 20 MG tablet Take 20 mg by mouth every evening.    . metoprolol tartrate (LOPRESSOR) 25 MG tablet Take 0.5 tablets (12.5 mg total) by mouth 2 (two) times daily. 90 tablet 2   No facility-administered medications prior to visit.    PAST MEDICAL HISTORY: Past Medical History:  Diagnosis Date  . Abnormality of gait   . Adenomatous colon polyp   . Arthritis   . Atrial fibrillation ([place])   . Back pain   . Cancer ([place])    skin cancers  . Depression   . Diverticulosis   . DJD (degenerative joint disease) of knee   . Esophageal reflux   . H/O  hiatal hernia   . Heart murmur   . Herpes zoster    throat  . History of shingles    left throat 07/2007  . Hyperlipidemia   . Hypertension   . Memory changes 01/14/2013  . Neuromuscular disorder ([place])    peripheral neuropathy  . Nocturnal leg cramps 07/15/2014  . Peripheral neuropathy   . Polyneuropathy in other diseases classified elsewhere ([place]) 01/14/2013  . Severe mitral regurgitation   . Stroke (cerebrum) ([place])     PAST SURGICAL HISTORY: Past Surgical History:  Procedure Laterality Date  . ABDOMINAL HYSTERECTOMY    . APPENDECTOMY    . BACK SURGERY  07/2017  . BUBBLE STUDY  10/31/2018   Procedure: BUBBLE STUDY;  Surgeon: Fay Records, MD;  Location: [facility] ENDOSCOPY;  Service: Cardiovascular;;  . COLON SURGERY     2001 diverticulitis with infection ..drained surgically .  Marland Kitchen EYE SURGERY     cat ext bil   . JOINT REPLACEMENT     right  . KNEE ARTHROSCOPY  11/08/2011  . LOOP RECORDER INSERTION N/A 10/31/2018   Procedure: LOOP RECORDER INSERTION;  Surgeon: Constance Haw, MD;  Location: [place] CV LAB;  Service: Cardiovascular;  Laterality: N/A;  . TEE WITHOUT CARDIOVERSION N/A 10/31/2018   Procedure: TRANSESOPHAGEAL ECHOCARDIOGRAM (TEE);  Surgeon: Fay Records, MD;  Location: [facility] ENDOSCOPY;  Service: Cardiovascular;  Laterality: N/A;  . TONSILLECTOMY    . TOTAL KNEE ARTHROPLASTY  11/07/2011   Procedure: TOTAL KNEE ARTHROPLASTY;  Surgeon: Ninetta Lights, MD;  Location: [place];  Service: Orthopedics;  Laterality: Left;  . TUBAL LIGATION    . WRIST SURGERY     for skin cancer    FAMILY HISTORY: Family History  Problem Relation Age of Onset  . Congestive Heart Failure Mother   . Stroke Father 70  . Heart attack Brother 32  . Diabetes Brother     SOCIAL HISTORY: Social History   Socioeconomic History  . Marital status: Widowed    Spouse name: Not on file  . Number of children: 3  . Years of education: hs  . Highest education level: Not on file  Occupational  History  . Occupation: retired  Tobacco Use  . Smoking status: Never Smoker  . Smokeless tobacco: Never Used  Vaping Use  . Vaping Use: Never used  Substance and Sexual Activity  . Alcohol use: No  . Drug use: No  . Sexual activity: Never  Other Topics Concern  . Not on file  Social History Narrative   Husband deceased from lung cancer 2013-07-07.   Patient is right handed.   Patient drinks 2 cups of  caffeine daily.   Social Determinants of Health   Financial Resource Strain: Low Risk   . Difficulty of Paying Living Expenses: Not hard at all  Food Insecurity: No Food Insecurity  . Worried About Charity fundraiser in the Last Year: Never true  . Ran Out of Food in the Last Year: Never true  Transportation Needs: No Transportation Needs  . Lack of Transportation (Medical): No  . Lack of Transportation (Non-Medical): No  Physical Activity: Unknown  . Days of Exercise per Week: Patient refused  . Minutes of Exercise per Session: Patient refused  Stress:   . Feeling of Stress :   Social Connections: Unknown  . Frequency of Communication with Friends and Family: Not on file  . Frequency of Social Gatherings with Friends and Family: Twice a week  . Attends Religious Services: Never  . Active Member of Clubs or Organizations: No  . Attends Archivist Meetings: Never  . Marital Status: Widowed  Intimate Partner Violence: Not At Risk  . Fear of Current or Ex-Partner: No  . Emotionally Abused: No  . Physically Abused: No  . Sexually Abused: No      PHYSICAL EXAM  Vitals:   10/22/19 1035  BP: 116/72  Pulse: 58  Weight: 138 lb (62.6 kg)  Height: 5\' 2"  (1.575 m)   Body mass index is 25.24 kg/m.  Generalized: Well developed, in no acute distress   Neurological examination  Mentation: Alert oriented to time, place, history taking. Follows all commands speech and language fluent Cranial nerve II-XII: Pupils were equal round reactive to light. Extraocular  movements were full, visual field were full on confrontational test. . Head turning and shoulder shrug  were normal and symmetric. Motor: The motor testing reveals 5 over 5 strength of all 4 extremities. Good symmetric motor tone is noted throughout.  Sensory: Sensory testing is intact to soft touch on all 4 extremities. No evidence of extinction is noted.  Coordination: Cerebellar testing reveals good finger-nose-finger and heel-to-shin bilaterally.  Gait and station: Gait is normal.  Reflexes: Deep tendon reflexes are symmetric and normal bilaterally.   DIAGNOSTIC DATA (LABS, IMAGING, TESTING) - I reviewed patient records, labs, notes, testing and imaging myself where available.  Lab Results  Component Value Date   WBC 7.1 04/21/2019   HGB 11.8 (L) 04/21/2019   HCT 36.5 04/21/2019   MCV 89.2 04/21/2019   PLT 177 04/21/2019      Component Value Date/Time   NA 140 04/21/2019 1505   NA 141 03/30/2019 1045   K 3.8 04/21/2019 1505   CL 106 04/21/2019 1505   CO2 24 04/21/2019 1505   GLUCOSE 92 04/21/2019 1505   BUN 14 04/21/2019 1505   BUN 13 03/30/2019 1045   CREATININE 0.84 04/21/2019 1505   CALCIUM 9.2 04/21/2019 1505   PROT 6.0 (L) 11/01/2018 0421   ALBUMIN 3.1 (L) 11/01/2018 0421   AST 24 11/01/2018 0421   ALT 16 11/01/2018 0421   ALKPHOS 43 11/01/2018 0421   BILITOT 1.1 11/01/2018 0421   GFRNONAA >60 04/21/2019 1505   GFRAA >60 04/21/2019 1505   Lab Results  Component Value Date   CHOL 149 10/26/2018   HDL 55 10/26/2018   LDLCALC 84 10/26/2018   TRIG 52 10/26/2018   CHOLHDL 2.7 10/26/2018   Lab Results  Component Value Date   HGBA1C 5.8 (H) 10/26/2018      ASSESSMENT AND PLAN 86 y.o. year old female  has a past medical  history of Abnormality of gait, Adenomatous colon polyp, Arthritis, Atrial fibrillation (HCC), Back pain, Cancer ([place]), Depression, Diverticulosis, DJD (degenerative joint disease) of knee, Esophageal reflux, H/O hiatal hernia, Heart murmur,  Herpes zoster, History of shingles, Hyperlipidemia, Hypertension, Memory changes (01/14/2013), Neuromuscular disorder ([place]), Nocturnal leg cramps (07/15/2014), Peripheral neuropathy, Polyneuropathy in other diseases classified elsewhere ([place]) (01/14/2013), Severe mitral regurgitation, and Stroke (cerebrum) ([place]). here with :  Peripheral neuropathy   Continue gabapentin 600 mg at bedtime  Nocturnal leg cramps   Continue baclofen 10 mg at bedtime  History of CVA   Advised to keep good control of her blood pressure with goal less than 130/90  Cholesterol LDL less than 70  Hemoglobin A1c less than 6.5%  Patient advised that if her symptoms worsen or she develops new symptoms she should let us know follow-up in 6 months or sooner if needed  I spent 20 minutes of face-to-face and non-face-to-face time with patient.  This included previsit chart review, lab review, study review, order entry, electronic health record documentation, patient education.  Ward Givens, MSN, NP-C 10/22/2019, 10:35 AM [facility] [address] [place], [place] [address] [phone]

## 2019-10-26 ENCOUNTER — Ambulatory Visit (INDEPENDENT_AMBULATORY_CARE_PROVIDER_SITE_OTHER): Payer: PPO | Admitting: *Deleted

## 2019-10-26 DIAGNOSIS — I6389 Other cerebral infarction: Secondary | ICD-10-CM | POA: Diagnosis not present

## 2019-10-26 DIAGNOSIS — K219 Gastro-esophageal reflux disease without esophagitis: Secondary | ICD-10-CM | POA: Diagnosis not present

## 2019-10-26 DIAGNOSIS — R7303 Prediabetes: Secondary | ICD-10-CM | POA: Diagnosis not present

## 2019-10-26 DIAGNOSIS — G629 Polyneuropathy, unspecified: Secondary | ICD-10-CM | POA: Diagnosis not present

## 2019-10-26 DIAGNOSIS — I10 Essential (primary) hypertension: Secondary | ICD-10-CM | POA: Diagnosis not present

## 2019-10-26 DIAGNOSIS — E7800 Pure hypercholesterolemia, unspecified: Secondary | ICD-10-CM | POA: Diagnosis not present

## 2019-10-26 DIAGNOSIS — I480 Paroxysmal atrial fibrillation: Secondary | ICD-10-CM | POA: Diagnosis not present

## 2019-10-26 LAB — CUP PACEART REMOTE DEVICE CHECK
Date Time Interrogation Session: 20210725233240
Implantable Pulse Generator Implant Date: 20200731

## 2019-10-27 NOTE — Progress Notes (Signed)
[facility] Summary Report / Loop Recorder 

## 2019-11-05 ENCOUNTER — Telehealth: Payer: Self-pay | Admitting: Neurology

## 2019-11-05 NOTE — Telephone Encounter (Signed)
Blood work was also done by the primary care physician on 26 October 2019.  White blood count is 5.4, hemoglobin 12.6, hematocrit 36.1, MCV of 87.0, platelets of 163, hemoglobin A1c 5.9.  Glucose is 102, BUN of 13, creatinine is 0.78, sodium 142, potassium 4.0, chloride 104, CO2 30, calcium 9.4, total protein 6.8, albumin 4.4, total bili is 1.5 which is slightly high, otherwise liver profile is unremarkable.

## 2019-11-26 ENCOUNTER — Other Ambulatory Visit (HOSPITAL_COMMUNITY): Payer: Self-pay | Admitting: Nurse Practitioner

## 2019-11-30 ENCOUNTER — Ambulatory Visit (INDEPENDENT_AMBULATORY_CARE_PROVIDER_SITE_OTHER): Payer: PPO | Admitting: *Deleted

## 2019-11-30 DIAGNOSIS — I6389 Other cerebral infarction: Secondary | ICD-10-CM

## 2019-11-30 LAB — CUP PACEART REMOTE DEVICE CHECK
Date Time Interrogation Session: 20210827235047
Implantable Pulse Generator Implant Date: 20200731

## 2019-12-01 NOTE — Progress Notes (Signed)
[facility] Summary Report / Loop Recorder 

## 2019-12-26 DIAGNOSIS — R350 Frequency of micturition: Secondary | ICD-10-CM | POA: Diagnosis not present

## 2019-12-30 DIAGNOSIS — M069 Rheumatoid arthritis, unspecified: Secondary | ICD-10-CM | POA: Diagnosis not present

## 2019-12-30 DIAGNOSIS — E7800 Pure hypercholesterolemia, unspecified: Secondary | ICD-10-CM | POA: Diagnosis not present

## 2019-12-30 DIAGNOSIS — F339 Major depressive disorder, recurrent, unspecified: Secondary | ICD-10-CM | POA: Diagnosis not present

## 2019-12-30 DIAGNOSIS — I10 Essential (primary) hypertension: Secondary | ICD-10-CM | POA: Diagnosis not present

## 2019-12-30 DIAGNOSIS — I480 Paroxysmal atrial fibrillation: Secondary | ICD-10-CM | POA: Diagnosis not present

## 2020-01-02 LAB — CUP PACEART REMOTE DEVICE CHECK
Date Time Interrogation Session: 20210929235132
Implantable Pulse Generator Implant Date: 20200731

## 2020-01-04 ENCOUNTER — Ambulatory Visit (INDEPENDENT_AMBULATORY_CARE_PROVIDER_SITE_OTHER): Payer: PPO

## 2020-01-04 DIAGNOSIS — I6389 Other cerebral infarction: Secondary | ICD-10-CM

## 2020-01-05 NOTE — Progress Notes (Signed)
[facility] Summary Report / Loop Recorder 

## 2020-01-19 DIAGNOSIS — H35033 Hypertensive retinopathy, bilateral: Secondary | ICD-10-CM | POA: Diagnosis not present

## 2020-01-22 ENCOUNTER — Other Ambulatory Visit (HOSPITAL_COMMUNITY): Payer: Self-pay | Admitting: Cardiovascular Disease

## 2020-02-03 LAB — CUP PACEART REMOTE DEVICE CHECK
Date Time Interrogation Session: 20211101235456
Implantable Pulse Generator Implant Date: 20200731

## 2020-02-08 ENCOUNTER — Ambulatory Visit (INDEPENDENT_AMBULATORY_CARE_PROVIDER_SITE_OTHER): Payer: PPO

## 2020-02-08 DIAGNOSIS — I63411 Cerebral infarction due to embolism of right middle cerebral artery: Secondary | ICD-10-CM

## 2020-02-09 NOTE — Progress Notes (Signed)
[facility] Summary Report / Loop Recorder 

## 2020-02-11 ENCOUNTER — Other Ambulatory Visit (HOSPITAL_COMMUNITY): Payer: Self-pay | Admitting: Nurse Practitioner

## 2020-02-18 DIAGNOSIS — H903 Sensorineural hearing loss, bilateral: Secondary | ICD-10-CM | POA: Diagnosis not present

## 2020-02-22 ENCOUNTER — Encounter: Payer: Self-pay | Admitting: Podiatry

## 2020-02-22 ENCOUNTER — Other Ambulatory Visit: Payer: Self-pay

## 2020-02-22 ENCOUNTER — Ambulatory Visit: Payer: PPO | Admitting: Podiatry

## 2020-02-22 DIAGNOSIS — B351 Tinea unguium: Secondary | ICD-10-CM | POA: Diagnosis not present

## 2020-02-22 DIAGNOSIS — M79674 Pain in right toe(s): Secondary | ICD-10-CM | POA: Diagnosis not present

## 2020-02-22 DIAGNOSIS — M79675 Pain in left toe(s): Secondary | ICD-10-CM

## 2020-02-22 NOTE — Progress Notes (Signed)
  Subjective:  Patient ID: Morgan Boyer, female    DOB: 06/19/1931,  MRN: [id number]  Chief Complaint  Patient presents with  . routine foot care    left hallux nail trimmed due to thickness     86 y.o. female presents with the above complaint. History confirmed with patient.  She is a thick yellow and uncomfortable toenail which causes her significant pain.  She has had the toenail removed before.  Objective:  Physical Exam: warm, good capillary refill, no trophic changes or ulcerative lesions, normal DP and PT pulses and normal sensory exam. Left Foot: Mycotic hallux nail Assessment:   1. Onychomycosis   2. Pain due to onychomycosis of toenails of both feet      Plan:  Patient was evaluated and treated and all questions answered.  Discussed the etiology and treatment options for the condition in detail with the patient. Educated patient on the topical and oral treatment options for mycotic nails. Recommended debridement of the nails today. Sharp and mechanical debridement performed of all painful and mycotic nails today. Nails debrided in length and thickness using a nail nipper and a mechanical burr to level of comfort. Discussed treatment options including appropriate shoe gear. Follow up as needed for painful nails.  We also discussed that if the nail is persistent and continues to recur like this we could remove it permanently with phenol matricectomy.  Return if symptoms worsen or fail to improve.

## 2020-03-02 ENCOUNTER — Other Ambulatory Visit (HOSPITAL_COMMUNITY): Payer: Self-pay | Admitting: Nurse Practitioner

## 2020-03-14 ENCOUNTER — Ambulatory Visit (INDEPENDENT_AMBULATORY_CARE_PROVIDER_SITE_OTHER): Payer: PPO

## 2020-03-14 DIAGNOSIS — I6389 Other cerebral infarction: Secondary | ICD-10-CM | POA: Diagnosis not present

## 2020-03-14 LAB — CUP PACEART REMOTE DEVICE CHECK
Date Time Interrogation Session: 20211213010316
Implantable Pulse Generator Implant Date: 20200731

## 2020-03-17 DIAGNOSIS — R159 Full incontinence of feces: Secondary | ICD-10-CM | POA: Diagnosis not present

## 2020-03-17 DIAGNOSIS — R198 Other specified symptoms and signs involving the digestive system and abdomen: Secondary | ICD-10-CM | POA: Diagnosis not present

## 2020-03-21 DIAGNOSIS — E7800 Pure hypercholesterolemia, unspecified: Secondary | ICD-10-CM | POA: Diagnosis not present

## 2020-03-21 DIAGNOSIS — I10 Essential (primary) hypertension: Secondary | ICD-10-CM | POA: Diagnosis not present

## 2020-03-21 DIAGNOSIS — M069 Rheumatoid arthritis, unspecified: Secondary | ICD-10-CM | POA: Diagnosis not present

## 2020-03-21 DIAGNOSIS — K219 Gastro-esophageal reflux disease without esophagitis: Secondary | ICD-10-CM | POA: Diagnosis not present

## 2020-03-21 DIAGNOSIS — F339 Major depressive disorder, recurrent, unspecified: Secondary | ICD-10-CM | POA: Diagnosis not present

## 2020-03-21 DIAGNOSIS — I480 Paroxysmal atrial fibrillation: Secondary | ICD-10-CM | POA: Diagnosis not present

## 2020-03-29 ENCOUNTER — Other Ambulatory Visit (HOSPITAL_COMMUNITY): Payer: Self-pay | Admitting: Cardiology

## 2020-03-29 NOTE — Progress Notes (Signed)
[facility] Summary Report / Loop Recorder 

## 2020-04-13 ENCOUNTER — Encounter: Payer: Self-pay | Admitting: Adult Health

## 2020-04-18 ENCOUNTER — Ambulatory Visit (INDEPENDENT_AMBULATORY_CARE_PROVIDER_SITE_OTHER): Payer: PPO

## 2020-04-18 DIAGNOSIS — I6389 Other cerebral infarction: Secondary | ICD-10-CM | POA: Diagnosis not present

## 2020-04-21 LAB — CUP PACEART REMOTE DEVICE CHECK
Date Time Interrogation Session: 20220115010240
Implantable Pulse Generator Implant Date: 20200731

## 2020-05-02 NOTE — Progress Notes (Signed)
[facility] Summary Report / Loop Recorder 

## 2020-05-10 ENCOUNTER — Other Ambulatory Visit (HOSPITAL_COMMUNITY): Payer: Self-pay | Admitting: Cardiovascular Disease

## 2020-05-16 ENCOUNTER — Telehealth: Payer: Self-pay | Admitting: Cardiovascular Disease

## 2020-05-16 MED ORDER — LISINOPRIL 20 MG PO TABS: 20.0000 mg | ORAL_TABLET | Freq: Two times a day (BID) | ORAL | 0 refills | Status: DC

## 2020-05-16 NOTE — Telephone Encounter (Signed)
°*  STAT* If patient is at the pharmacy, call can be transferred to refill team.   1. Which medications need to be refilled? (please list name of each medication and dose if known) lisinopril (ZESTRIL) 20 MG tablet  2. Which pharmacy/location (including street and city if local pharmacy) is medication to be sent to? [place], [place]  3. Do they need a 30 day or 90 day supply? 90 day supply  Patient is scheduled for an appointment on 07/05/20 at 3:45 PM with Cecilie Kicks.

## 2020-05-16 NOTE — Telephone Encounter (Signed)
Pt's medication was sent to pt's pharmacy as requested. Confirmation received.  °

## 2020-05-19 LAB — CUP PACEART REMOTE DEVICE CHECK
Date Time Interrogation Session: 20220217010928
Implantable Pulse Generator Implant Date: 20200731

## 2020-05-22 DIAGNOSIS — M069 Rheumatoid arthritis, unspecified: Secondary | ICD-10-CM | POA: Diagnosis not present

## 2020-05-22 DIAGNOSIS — I480 Paroxysmal atrial fibrillation: Secondary | ICD-10-CM | POA: Diagnosis not present

## 2020-05-22 DIAGNOSIS — K219 Gastro-esophageal reflux disease without esophagitis: Secondary | ICD-10-CM | POA: Diagnosis not present

## 2020-05-22 DIAGNOSIS — F339 Major depressive disorder, recurrent, unspecified: Secondary | ICD-10-CM | POA: Diagnosis not present

## 2020-05-22 DIAGNOSIS — I10 Essential (primary) hypertension: Secondary | ICD-10-CM | POA: Diagnosis not present

## 2020-05-22 DIAGNOSIS — E7800 Pure hypercholesterolemia, unspecified: Secondary | ICD-10-CM | POA: Diagnosis not present

## 2020-05-23 ENCOUNTER — Ambulatory Visit (INDEPENDENT_AMBULATORY_CARE_PROVIDER_SITE_OTHER): Payer: PPO

## 2020-05-23 DIAGNOSIS — I6389 Other cerebral infarction: Secondary | ICD-10-CM | POA: Diagnosis not present

## 2020-05-24 ENCOUNTER — Telehealth: Payer: Self-pay | Admitting: Adult Health

## 2020-05-24 NOTE — Telephone Encounter (Signed)
Pt's son, Adelynn Gipe (on [place]) called, I have a question regarding the gabapentin (NEURONTIN) 600 MG tablet she is on. Would like a call from the nurse.

## 2020-05-25 NOTE — Telephone Encounter (Signed)
LMVm for son that returned call.

## 2020-05-26 NOTE — Telephone Encounter (Signed)
Called and spoke to son and patient. She is having grogginess thinking it from taking the 600 mg tablet of gabapentin. She has been on this medication for years. She is started taking a half a tablet at night son was not sure if that made a difference. She has symptoms also of short-term memory worsening some confusion has a history of stroke. Moved up her appointment till next Wednesday, March 2 at 230 to arrive at 2:00 for check-in. She also takes baclofen at night. She had not been to PCP. Has an appointment next month.

## 2020-05-30 NOTE — Progress Notes (Signed)
[facility] Summary Report / Loop Recorder 

## 2020-06-01 ENCOUNTER — Ambulatory Visit: Payer: PPO | Admitting: Adult Health

## 2020-06-01 ENCOUNTER — Encounter: Payer: Self-pay | Admitting: Adult Health

## 2020-06-01 VITALS — BP 132/69 | HR 60 | Ht 62.0 in | Wt 139.0 lb

## 2020-06-01 DIAGNOSIS — G4762 Sleep related leg cramps: Secondary | ICD-10-CM | POA: Diagnosis not present

## 2020-06-01 DIAGNOSIS — G63 Polyneuropathy in diseases classified elsewhere: Secondary | ICD-10-CM

## 2020-06-01 MED ORDER — BACLOFEN 5 MG PO TABS: 5.0000 mg | ORAL_TABLET | Freq: Every day | ORAL | 5 refills | Status: AC

## 2020-06-01 MED ORDER — GABAPENTIN 300 MG PO CAPS: 300.0000 mg | ORAL_CAPSULE | Freq: Every day | ORAL | 5 refills | Status: AC

## 2020-06-01 NOTE — Progress Notes (Signed)
I have read the note, and I agree with the clinical assessment and plan.  Morgan Boyer Morgan Boyer   

## 2020-06-01 NOTE — Progress Notes (Signed)
PATIENT: Morgan Boyer DOB: Dec 29, 1931  REASON FOR VISIT: follow up HISTORY FROM: patient  HISTORY OF PRESENT ILLNESS: Today 06/01/20:  Morgan Boyer is an 87 year old female with a history of peripheral neuropathy and nocturnal leg clearance.  She returns today for follow-up.  She feels that the medicine works well for her cramps however she feels groggy the next day until around lunchtime.  She has not had any breakthrough symptoms.  Overall she feels that she is doing well.  She does state that she reduce her dose of gabapentin to 300 mg temporarily but did not notice a change in her grogginess.  She has not tried reducing her baclofen dose.  She returns today for an evaluation.  HISTORY 10/22/19:  Morgan Boyer is an 87 year old female with a history of peripheral neuropathy and nocturnal leg cramps.  She returns today for follow-up.  Overall she feels that she is doing well.  She continues to take gabapentin and baclofen at bedtime.  Reports that this is helpful for neuropathy and muscle cramps.  She denies any strokelike symptoms.  She continues to have regular follow-ups with her PCP.  She returns today for an evaluation.  REVIEW OF SYSTEMS: Out of a complete 14 system review of symptoms, the patient complains only of the following symptoms, and all other reviewed systems are negative.  See HPI  ALLERGIES: Allergies  Allergen Reactions  . Septra [Sulfamethoxazole-Trimethoprim] Hives  . Lotensin [Benazepril Hcl] Other (See Comments)    NOT KNOWN    HOME MEDICATIONS: Outpatient Medications Prior to Visit  Medication Sig Dispense Refill  . acetaminophen (TYLENOL) 325 MG tablet Take 1-2 tablets (325-650 mg total) by mouth every 4 (four) hours as needed for mild pain.    Marland Kitchen b complex vitamins tablet Take 1 tablet by mouth daily.    . baclofen (LIORESAL) 10 MG tablet Take 1 tablet (10 mg total) by mouth at bedtime. 90 tablet 3  . Calcium Citrate (CITRACAL PO) Take 1 tablet by mouth  2 (two) times daily. + D3    . diclofenac Sodium (VOLTAREN) 1 % GEL as needed.     Marland Kitchen ELIQUIS 5 MG TABS tablet Take 1 tablet by mouth twice daily 180 tablet 1  . furosemide (LASIX) 20 MG tablet Take 2 tablets (40 mg total) by mouth daily. 180 tablet 3  . gabapentin (NEURONTIN) 600 MG tablet Take 1 tablet (600 mg total) by mouth at bedtime. 90 tablet 3  . hydrocortisone 2.5 % cream Apply 1 application topically daily as needed (rash).   11  . lisinopril (ZESTRIL) 20 MG tablet Take 1 tablet (20 mg total) by mouth 2 (two) times daily. Please keep upcoming appt in April 2022 before anymore refills. Thank you Final Attempt 180 tablet 0  . MELATONIN PO Take 5 mg by mouth daily in the afternoon. Takes one gummy daily    . metoprolol tartrate (LOPRESSOR) 25 MG tablet Take 1/2 (one-half) tablet by mouth twice daily 90 tablet 3  . pantoprazole (PROTONIX) 40 MG tablet Take 1 tablet (40 mg total) by mouth daily. 30 tablet 1  . Polyethyl Glycol-Propyl Glycol (SYSTANE OP) Place 1 drop into both eyes 3 (three) times daily as needed (for dry eyes).     . simvastatin (ZOCOR) 20 MG tablet Take 20 mg by mouth every evening.     No facility-administered medications prior to visit.    PAST MEDICAL HISTORY: Past Medical History:  Diagnosis Date  . Abnormality of gait   .  Adenomatous colon polyp   . Arthritis   . Atrial fibrillation ([place])   . Back pain   . Cancer ([place])    skin cancers  . Depression   . Diverticulosis   . DJD (degenerative joint disease) of knee   . Esophageal reflux   . H/O hiatal hernia   . Heart murmur   . Herpes zoster    throat  . History of shingles    left throat 07/2007  . Hyperlipidemia   . Hypertension   . Memory changes 01/14/2013  . Neuromuscular disorder ([place])    peripheral neuropathy  . Nocturnal leg cramps 07/15/2014  . Peripheral neuropathy   . Polyneuropathy in other diseases classified elsewhere ([place]) 01/14/2013  . Severe mitral regurgitation   . Stroke (cerebrum)  ([place])     PAST SURGICAL HISTORY: Past Surgical History:  Procedure Laterality Date  . ABDOMINAL HYSTERECTOMY    . APPENDECTOMY    . BACK SURGERY  07/2017  . BUBBLE STUDY  10/31/2018   Procedure: BUBBLE STUDY;  Surgeon: Fay Records, MD;  Location: [facility] ENDOSCOPY;  Service: Cardiovascular;;  . COLON SURGERY     2001 diverticulitis with infection ..drained surgically .  Marland Kitchen EYE SURGERY     cat ext bil   . JOINT REPLACEMENT     right  . KNEE ARTHROSCOPY  11/08/2011  . LOOP RECORDER INSERTION N/A 10/31/2018   Procedure: LOOP RECORDER INSERTION;  Surgeon: Constance Haw, MD;  Location: [place] CV LAB;  Service: Cardiovascular;  Laterality: N/A;  . TEE WITHOUT CARDIOVERSION N/A 10/31/2018   Procedure: TRANSESOPHAGEAL ECHOCARDIOGRAM (TEE);  Surgeon: Fay Records, MD;  Location: [facility] ENDOSCOPY;  Service: Cardiovascular;  Laterality: N/A;  . TONSILLECTOMY    . TOTAL KNEE ARTHROPLASTY  11/07/2011   Procedure: TOTAL KNEE ARTHROPLASTY;  Surgeon: Ninetta Lights, MD;  Location: [place];  Service: Orthopedics;  Laterality: Left;  . TUBAL LIGATION    . WRIST SURGERY     for skin cancer    FAMILY HISTORY: Family History  Problem Relation Age of Onset  . Congestive Heart Failure Mother   . Stroke Father 73  . Heart attack Brother 25  . Diabetes Brother     SOCIAL HISTORY: Social History   Socioeconomic History  . Marital status: Widowed    Spouse name: Not on file  . Number of children: 3  . Years of education: hs  . Highest education level: Not on file  Occupational History  . Occupation: retired  Tobacco Use  . Smoking status: Never Smoker  . Smokeless tobacco: Never Used  Vaping Use  . Vaping Use: Never used  Substance and Sexual Activity  . Alcohol use: No  . Drug use: No  . Sexual activity: Never  Other Topics Concern  . Not on file  Social History Narrative   Husband deceased from lung cancer 06-10-2013.   Patient is right handed.   Patient drinks 2 cups of caffeine  daily.   Social Determinants of Health   Financial Resource Strain: Not on file  Food Insecurity: Not on file  Transportation Needs: Not on file  Physical Activity: Not on file  Stress: Not on file  Social Connections: Not on file  Intimate Partner Violence: Not on file      PHYSICAL EXAM  Vitals:   06/01/20 1431  BP: 132/69  Pulse: 60  Weight: 139 lb (63 kg)  Height: 5\' 2"  (1.575 m)   Body mass index is 25.[place]  kg/m.  Generalized: Well developed, in no acute distress   Neurological examination  Mentation: Alert oriented to time, place, history taking. Follows all commands speech and language fluent Cranial nerve II-XII: Pupils were equal round reactive to light. Extraocular movements were full, visual field were full on confrontational test. Facial sensation and strength were normal. Uvula tongue midline. Head turning and shoulder shrug  were normal and symmetric. Motor: The motor testing reveals 5 over 5 strength of all 4 extremities. Good symmetric motor tone is noted throughout.  Sensory: Sensory testing is intact to soft touch on all 4 extremities. No evidence of extinction is noted.  Coordination: Cerebellar testing reveals good finger-nose-finger and heel-to-shin bilaterally.  Gait and station: Gait is normal.  Reflexes: Deep tendon reflexes are symmetric and normal bilaterally.   DIAGNOSTIC DATA (LABS, IMAGING, TESTING) - I reviewed patient records, labs, notes, testing and imaging myself where available.  Lab Results  Component Value Date   WBC 7.1 04/21/2019   HGB 11.8 (L) 04/21/2019   HCT 36.5 04/21/2019   MCV 89.2 04/21/2019   PLT 177 04/21/2019      Component Value Date/Time   NA 140 04/21/2019 1505   NA 141 03/30/2019 1045   K 3.8 04/21/2019 1505   CL 106 04/21/2019 1505   CO2 24 04/21/2019 1505   GLUCOSE 92 04/21/2019 1505   BUN 14 04/21/2019 1505   BUN 13 03/30/2019 1045   CREATININE 0.84 04/21/2019 1505   CALCIUM 9.2 04/21/2019 1505   PROT  6.0 (L) 11/01/2018 0421   ALBUMIN 3.1 (L) 11/01/2018 0421   AST 24 11/01/2018 0421   ALT 16 11/01/2018 0421   ALKPHOS 43 11/01/2018 0421   BILITOT 1.1 11/01/2018 0421   GFRNONAA >60 04/21/2019 1505   GFRAA >60 04/21/2019 1505   Lab Results  Component Value Date   CHOL 149 10/26/2018   HDL 55 10/26/2018   LDLCALC 84 10/26/2018   TRIG 52 10/26/2018   CHOLHDL 2.7 10/26/2018   Lab Results  Component Value Date   HGBA1C 5.8 (H) 10/26/2018      ASSESSMENT AND PLAN 87 y.o. year old female  has a past medical history of Abnormality of gait, Adenomatous colon polyp, Arthritis, Atrial fibrillation ([place]), Back pain, Cancer ([place]), Depression, Diverticulosis, DJD (degenerative joint disease) of knee, Esophageal reflux, H/O hiatal hernia, Heart murmur, Herpes zoster, History of shingles, Hyperlipidemia, Hypertension, Memory changes (01/14/2013), Neuromuscular disorder ([place]), Nocturnal leg cramps (07/15/2014), Peripheral neuropathy, Polyneuropathy in other diseases classified elsewhere ([place]) (01/14/2013), Severe mitral regurgitation, and Stroke (cerebrum) ([place]). here with :  1.  Nocturnal leg cramps 2.  Peripheral neuropathy  --Reduce gabapentin to 300 mg at bedtime --Reduce baclofen to 5 mg at bedtime --Advised that she is still having grogginess the next morning we will discontinue baclofen. --Follow-up in 6 months or sooner if needed   I spent 30 minutes of face-to-face and non-face-to-face time with patient.  This included previsit chart review, lab review, study review, order entry, electronic health record documentation, patient education.  Ward Givens, MSN, NP-C 06/01/2020, 2:39 PM [facility] [address] [place], [place] [address] [phone]

## 2020-06-01 NOTE — Patient Instructions (Signed)
Your Plan:  Decrease gabapentin to 300 mg at bedtime Decrease baclofen to 5 mg at bedtime If your symptoms worsen or you develop new symptoms please let us know.    Thank you for coming to see [place] at [facility]. I hope we have been able to provide you high quality care today.  You may receive a patient satisfaction survey over the next few weeks. We would appreciate your feedback and comments so that we may continue to improve ourselves and the health of our patients.

## 2020-06-06 DIAGNOSIS — I10 Essential (primary) hypertension: Secondary | ICD-10-CM | POA: Diagnosis not present

## 2020-06-06 DIAGNOSIS — E7800 Pure hypercholesterolemia, unspecified: Secondary | ICD-10-CM | POA: Diagnosis not present

## 2020-06-06 DIAGNOSIS — K219 Gastro-esophageal reflux disease without esophagitis: Secondary | ICD-10-CM | POA: Diagnosis not present

## 2020-06-06 DIAGNOSIS — M069 Rheumatoid arthritis, unspecified: Secondary | ICD-10-CM | POA: Diagnosis not present

## 2020-06-06 DIAGNOSIS — F339 Major depressive disorder, recurrent, unspecified: Secondary | ICD-10-CM | POA: Diagnosis not present

## 2020-06-06 DIAGNOSIS — I480 Paroxysmal atrial fibrillation: Secondary | ICD-10-CM | POA: Diagnosis not present

## 2020-06-06 DIAGNOSIS — I509 Heart failure, unspecified: Secondary | ICD-10-CM | POA: Diagnosis not present

## 2020-06-25 LAB — CUP PACEART REMOTE DEVICE CHECK
Date Time Interrogation Session: 20220322020850
Implantable Pulse Generator Implant Date: 20200731

## 2020-06-27 ENCOUNTER — Ambulatory Visit (INDEPENDENT_AMBULATORY_CARE_PROVIDER_SITE_OTHER): Payer: PPO

## 2020-06-27 DIAGNOSIS — G4762 Sleep related leg cramps: Secondary | ICD-10-CM | POA: Diagnosis not present

## 2020-06-27 DIAGNOSIS — Z8673 Personal history of transient ischemic attack (TIA), and cerebral infarction without residual deficits: Secondary | ICD-10-CM | POA: Diagnosis not present

## 2020-06-27 DIAGNOSIS — R7303 Prediabetes: Secondary | ICD-10-CM | POA: Diagnosis not present

## 2020-06-27 DIAGNOSIS — Z23 Encounter for immunization: Secondary | ICD-10-CM | POA: Diagnosis not present

## 2020-06-27 DIAGNOSIS — R5381 Other malaise: Secondary | ICD-10-CM | POA: Diagnosis not present

## 2020-06-27 DIAGNOSIS — I6389 Other cerebral infarction: Secondary | ICD-10-CM

## 2020-06-27 DIAGNOSIS — I340 Nonrheumatic mitral (valve) insufficiency: Secondary | ICD-10-CM | POA: Diagnosis not present

## 2020-06-27 DIAGNOSIS — I10 Essential (primary) hypertension: Secondary | ICD-10-CM | POA: Diagnosis not present

## 2020-06-27 DIAGNOSIS — I480 Paroxysmal atrial fibrillation: Secondary | ICD-10-CM | POA: Diagnosis not present

## 2020-06-27 DIAGNOSIS — G629 Polyneuropathy, unspecified: Secondary | ICD-10-CM | POA: Diagnosis not present

## 2020-06-27 DIAGNOSIS — E7800 Pure hypercholesterolemia, unspecified: Secondary | ICD-10-CM | POA: Diagnosis not present

## 2020-06-27 DIAGNOSIS — Z7901 Long term (current) use of anticoagulants: Secondary | ICD-10-CM | POA: Diagnosis not present

## 2020-07-05 ENCOUNTER — Other Ambulatory Visit: Payer: Self-pay

## 2020-07-05 ENCOUNTER — Ambulatory Visit: Payer: PPO | Admitting: Cardiology

## 2020-07-05 ENCOUNTER — Inpatient Hospital Stay (HOSPITAL_COMMUNITY)
Admission: AD | Admit: 2020-07-05 | Discharge: 2020-07-15 | DRG: 286 | Disposition: A | Discharge status: Skilled Nursing Facility | Payer: PPO | Attending: Cardiology | Admitting: Cardiology

## 2020-07-05 ENCOUNTER — Encounter: Payer: Self-pay | Admitting: Cardiology

## 2020-07-05 ENCOUNTER — Inpatient Hospital Stay (HOSPITAL_COMMUNITY): Payer: PPO

## 2020-07-05 VITALS — BP 136/86 | HR 67 | Ht 62.0 in | Wt 141.6 lb

## 2020-07-05 DIAGNOSIS — N1832 Chronic kidney disease, stage 3b: Secondary | ICD-10-CM | POA: Diagnosis present

## 2020-07-05 DIAGNOSIS — G629 Polyneuropathy, unspecified: Secondary | ICD-10-CM | POA: Diagnosis present

## 2020-07-05 DIAGNOSIS — I2510 Atherosclerotic heart disease of native coronary artery without angina pectoris: Secondary | ICD-10-CM | POA: Diagnosis not present

## 2020-07-05 DIAGNOSIS — Z20822 Contact with and (suspected) exposure to covid-19: Secondary | ICD-10-CM | POA: Diagnosis not present

## 2020-07-05 DIAGNOSIS — R413 Other amnesia: Secondary | ICD-10-CM | POA: Diagnosis present

## 2020-07-05 DIAGNOSIS — K449 Diaphragmatic hernia without obstruction or gangrene: Secondary | ICD-10-CM | POA: Diagnosis not present

## 2020-07-05 DIAGNOSIS — I272 Pulmonary hypertension, unspecified: Secondary | ICD-10-CM | POA: Diagnosis not present

## 2020-07-05 DIAGNOSIS — G8929 Other chronic pain: Secondary | ICD-10-CM | POA: Diagnosis present

## 2020-07-05 DIAGNOSIS — I313 Pericardial effusion (noninflammatory): Secondary | ICD-10-CM | POA: Diagnosis not present

## 2020-07-05 DIAGNOSIS — I480 Paroxysmal atrial fibrillation: Secondary | ICD-10-CM

## 2020-07-05 DIAGNOSIS — R079 Chest pain, unspecified: Secondary | ICD-10-CM | POA: Diagnosis not present

## 2020-07-05 DIAGNOSIS — Z79899 Other long term (current) drug therapy: Secondary | ICD-10-CM

## 2020-07-05 DIAGNOSIS — R262 Difficulty in walking, not elsewhere classified: Secondary | ICD-10-CM | POA: Diagnosis not present

## 2020-07-05 DIAGNOSIS — I10 Essential (primary) hypertension: Secondary | ICD-10-CM | POA: Diagnosis present

## 2020-07-05 DIAGNOSIS — Z8673 Personal history of transient ischemic attack (TIA), and cerebral infarction without residual deficits: Secondary | ICD-10-CM | POA: Diagnosis not present

## 2020-07-05 DIAGNOSIS — I351 Nonrheumatic aortic (valve) insufficiency: Secondary | ICD-10-CM | POA: Diagnosis not present

## 2020-07-05 DIAGNOSIS — N179 Acute kidney failure, unspecified: Secondary | ICD-10-CM | POA: Diagnosis not present

## 2020-07-05 DIAGNOSIS — M6281 Muscle weakness (generalized): Secondary | ICD-10-CM | POA: Diagnosis not present

## 2020-07-05 DIAGNOSIS — I309 Acute pericarditis, unspecified: Secondary | ICD-10-CM | POA: Diagnosis not present

## 2020-07-05 DIAGNOSIS — R001 Bradycardia, unspecified: Secondary | ICD-10-CM | POA: Diagnosis not present

## 2020-07-05 DIAGNOSIS — I509 Heart failure, unspecified: Secondary | ICD-10-CM | POA: Diagnosis not present

## 2020-07-05 DIAGNOSIS — T462X5A Adverse effect of other antidysrhythmic drugs, initial encounter: Secondary | ICD-10-CM | POA: Diagnosis not present

## 2020-07-05 DIAGNOSIS — I440 Atrioventricular block, first degree: Secondary | ICD-10-CM | POA: Diagnosis present

## 2020-07-05 DIAGNOSIS — R0789 Other chest pain: Secondary | ICD-10-CM | POA: Diagnosis not present

## 2020-07-05 DIAGNOSIS — I4891 Unspecified atrial fibrillation: Secondary | ICD-10-CM | POA: Diagnosis not present

## 2020-07-05 DIAGNOSIS — I5033 Acute on chronic diastolic (congestive) heart failure: Secondary | ICD-10-CM | POA: Diagnosis not present

## 2020-07-05 DIAGNOSIS — R2681 Unsteadiness on feet: Secondary | ICD-10-CM | POA: Diagnosis not present

## 2020-07-05 DIAGNOSIS — R41841 Cognitive communication deficit: Secondary | ICD-10-CM | POA: Diagnosis not present

## 2020-07-05 DIAGNOSIS — I130 Hypertensive heart and chronic kidney disease with heart failure and stage 1 through stage 4 chronic kidney disease, or unspecified chronic kidney disease: Secondary | ICD-10-CM | POA: Diagnosis present

## 2020-07-05 DIAGNOSIS — I340 Nonrheumatic mitral (valve) insufficiency: Secondary | ICD-10-CM

## 2020-07-05 DIAGNOSIS — M4800 Spinal stenosis, site unspecified: Secondary | ICD-10-CM | POA: Diagnosis not present

## 2020-07-05 DIAGNOSIS — R1312 Dysphagia, oropharyngeal phase: Secondary | ICD-10-CM | POA: Diagnosis not present

## 2020-07-05 DIAGNOSIS — Z85828 Personal history of other malignant neoplasm of skin: Secondary | ICD-10-CM | POA: Diagnosis not present

## 2020-07-05 DIAGNOSIS — R54 Age-related physical debility: Secondary | ICD-10-CM | POA: Diagnosis present

## 2020-07-05 DIAGNOSIS — I361 Nonrheumatic tricuspid (valve) insufficiency: Secondary | ICD-10-CM | POA: Diagnosis not present

## 2020-07-05 DIAGNOSIS — I081 Rheumatic disorders of both mitral and tricuspid valves: Secondary | ICD-10-CM | POA: Diagnosis not present

## 2020-07-05 DIAGNOSIS — Z96652 Presence of left artificial knee joint: Secondary | ICD-10-CM | POA: Diagnosis not present

## 2020-07-05 DIAGNOSIS — I4819 Other persistent atrial fibrillation: Secondary | ICD-10-CM | POA: Diagnosis present

## 2020-07-05 DIAGNOSIS — E785 Hyperlipidemia, unspecified: Secondary | ICD-10-CM | POA: Diagnosis not present

## 2020-07-05 DIAGNOSIS — K219 Gastro-esophageal reflux disease without esophagitis: Secondary | ICD-10-CM | POA: Diagnosis not present

## 2020-07-05 DIAGNOSIS — I129 Hypertensive chronic kidney disease with stage 1 through stage 4 chronic kidney disease, or unspecified chronic kidney disease: Secondary | ICD-10-CM | POA: Diagnosis not present

## 2020-07-05 DIAGNOSIS — F32A Depression, unspecified: Secondary | ICD-10-CM | POA: Diagnosis not present

## 2020-07-05 DIAGNOSIS — I200 Unstable angina: Principal | ICD-10-CM

## 2020-07-05 DIAGNOSIS — I110 Hypertensive heart disease with heart failure: Secondary | ICD-10-CM | POA: Diagnosis not present

## 2020-07-05 DIAGNOSIS — R0902 Hypoxemia: Secondary | ICD-10-CM | POA: Diagnosis not present

## 2020-07-05 DIAGNOSIS — I69828 Other speech and language deficits following other cerebrovascular disease: Secondary | ICD-10-CM | POA: Diagnosis not present

## 2020-07-05 DIAGNOSIS — I69891 Dysphagia following other cerebrovascular disease: Secondary | ICD-10-CM | POA: Diagnosis not present

## 2020-07-05 DIAGNOSIS — I69354 Hemiplegia and hemiparesis following cerebral infarction affecting left non-dominant side: Secondary | ICD-10-CM | POA: Diagnosis not present

## 2020-07-05 LAB — COMPREHENSIVE METABOLIC PANEL
ALT: 18 U/L (ref 0–44)
AST: 29 U/L (ref 15–41)
Albumin: 3.8 g/dL (ref 3.5–5.0)
Alkaline Phosphatase: 50 U/L (ref 38–126)
Anion gap: 9 (ref 5–15)
BUN: 14 mg/dL (ref 8–23)
CO2: 26 mmol/L (ref 22–32)
Calcium: 8.8 mg/dL — ABNORMAL LOW (ref 8.9–10.3)
Chloride: 104 mmol/L (ref 98–111)
Creatinine, Ser: 0.79 mg/dL (ref 0.44–1.00)
GFR, Estimated: >60 mL/min (ref >60)
Glucose, Bld: 119 mg/dL — ABNORMAL HIGH (ref 70–99)
Potassium: 3.4 mmol/L — ABNORMAL LOW (ref 3.5–5.1)
Sodium: 139 mmol/L (ref 135–145)
Total Bilirubin: 2 mg/dL — ABNORMAL HIGH (ref 0.3–1.2)
Total Protein: 6.6 g/dL (ref 6.5–8.1)

## 2020-07-05 LAB — T4, FREE: Free T4: 1.02 ng/dL (ref 0.61–1.12)

## 2020-07-05 LAB — CBC WITH DIFFERENTIAL/PLATELET
Abs Immature Granulocytes: 0.04 10*3/uL (ref 0.00–0.07)
Basophils Absolute: 0 10*3/uL (ref 0.0–0.1)
Basophils Relative: 0 %
Eosinophils Absolute: 0 10*3/uL (ref 0.0–0.5)
Eosinophils Relative: 0 %
HCT: 36.3 % (ref 36.0–46.0)
Hemoglobin: 11.8 g/dL — ABNORMAL LOW (ref 12.0–15.0)
Immature Granulocytes: 0 %
Lymphocytes Relative: 10 %
Lymphs Abs: 1 10*3/uL (ref 0.7–4.0)
MCH: 28.9 pg (ref 26.0–34.0)
MCHC: 32.5 g/dL (ref 30.0–36.0)
MCV: 88.8 fL (ref 80.0–100.0)
Monocytes Absolute: 1.6 10*3/uL — ABNORMAL HIGH (ref 0.1–1.0)
Monocytes Relative: 16 %
Neutro Abs: 7.6 10*3/uL (ref 1.7–7.7)
Neutrophils Relative %: 74 %
Platelets: 156 10*3/uL (ref 150–400)
RBC: 4.09 MIL/uL (ref 3.87–5.11)
RDW: 14.3 % (ref 11.5–15.5)
WBC: 10.2 10*3/uL (ref 4.0–10.5)
nRBC: 0 % (ref 0.0–0.2)

## 2020-07-05 LAB — HEMOGLOBIN A1C
Hgb A1c MFr Bld: 6.3 % — ABNORMAL HIGH (ref 4.8–5.6)
Mean Plasma Glucose: 134.11 mg/dL

## 2020-07-05 LAB — MAGNESIUM: Magnesium: 1.9 mg/dL (ref 1.7–2.4)

## 2020-07-05 LAB — TROPONIN I (HIGH SENSITIVITY): Troponin I (High Sensitivity): 19 ng/L — ABNORMAL HIGH (ref <18)

## 2020-07-05 LAB — PROTIME-INR
INR: 1.8 — ABNORMAL HIGH (ref 0.8–1.2)
Prothrombin Time: 20.5 seconds — ABNORMAL HIGH (ref 11.4–15.2)

## 2020-07-05 LAB — APTT: aPTT: 34 seconds (ref 24–36)

## 2020-07-05 LAB — HEPARIN LEVEL (UNFRACTIONATED): Heparin Unfractionated: >2.2 IU/mL — ABNORMAL HIGH (ref 0.30–0.70)

## 2020-07-05 MED ORDER — ONDANSETRON HCL 4 MG/2ML IJ SOLN: 4.0000 mg | Freq: Four times a day (QID) | INTRAMUSCULAR | Status: DC | PRN

## 2020-07-05 MED ORDER — SIMVASTATIN 20 MG PO TABS
20.0000 mg | ORAL_TABLET | Freq: Every evening | ORAL | Status: DC
  Administered 2020-07-06 – 2020-07-14 (×10): 20 mg via ORAL
  Filled 2020-07-05 (×10): qty 1

## 2020-07-05 MED ORDER — SODIUM CHLORIDE 0.9 % IV SOLN: INTRAVENOUS | Status: DC

## 2020-07-05 MED ORDER — GABAPENTIN 300 MG PO CAPS
300.0000 mg | ORAL_CAPSULE | Freq: Every day | ORAL | Status: DC
  Administered 2020-07-05 – 2020-07-14 (×10): 300 mg via ORAL
  Filled 2020-07-05 (×10): qty 1

## 2020-07-05 MED ORDER — ACETAMINOPHEN 325 MG PO TABS: 325.0000 mg | ORAL_TABLET | ORAL | Status: DC | PRN

## 2020-07-05 MED ORDER — ACETAMINOPHEN 325 MG PO TABS
650.0000 mg | ORAL_TABLET | ORAL | Status: DC | PRN
  Administered 2020-07-05: 650 mg via ORAL
  Filled 2020-07-05: qty 2

## 2020-07-05 MED ORDER — POTASSIUM CHLORIDE CRYS ER 20 MEQ PO TBCR
20.0000 meq | EXTENDED_RELEASE_TABLET | Freq: Two times a day (BID) | ORAL | Status: DC
  Administered 2020-07-06: 20 meq via ORAL
  Filled 2020-07-05: qty 1

## 2020-07-05 MED ORDER — POLYVINYL ALCOHOL 1.4 % OP SOLN
1.0000 [drp] | OPHTHALMIC | Status: DC | PRN
  Filled 2020-07-05: qty 15

## 2020-07-05 MED ORDER — NITROGLYCERIN 0.4 MG SL SUBL
0.4000 mg | SUBLINGUAL_TABLET | SUBLINGUAL | Status: DC | PRN
  Administered 2020-07-05 – 2020-07-08 (×12): 0.4 mg via SUBLINGUAL
  Filled 2020-07-05 (×3): qty 1

## 2020-07-05 MED ORDER — LISINOPRIL 20 MG PO TABS
20.0000 mg | ORAL_TABLET | Freq: Two times a day (BID) | ORAL | Status: DC
  Administered 2020-07-05 – 2020-07-09 (×9): 20 mg via ORAL
  Filled 2020-07-05 (×10): qty 1

## 2020-07-05 MED ORDER — B COMPLEX-C PO TABS
1.0000 | ORAL_TABLET | Freq: Every day | ORAL | Status: DC
  Administered 2020-07-06 – 2020-07-15 (×9): 1 via ORAL
  Filled 2020-07-05 (×10): qty 1

## 2020-07-05 MED ORDER — METOPROLOL TARTRATE 12.5 MG HALF TABLET
12.5000 mg | ORAL_TABLET | Freq: Two times a day (BID) | ORAL | Status: DC
  Administered 2020-07-05 – 2020-07-06 (×3): 12.5 mg via ORAL
  Filled 2020-07-05 (×3): qty 1

## 2020-07-05 MED ORDER — POLYETHYL GLYCOL-PROPYL GLYCOL 0.4-0.3 % OP GEL: Freq: Three times a day (TID) | OPHTHALMIC | Status: DC | PRN

## 2020-07-05 MED ORDER — PANTOPRAZOLE SODIUM 40 MG PO TBEC
40.0000 mg | DELAYED_RELEASE_TABLET | Freq: Every day | ORAL | Status: DC
  Administered 2020-07-06 – 2020-07-08 (×3): 40 mg via ORAL
  Filled 2020-07-05 (×4): qty 1

## 2020-07-05 MED ORDER — HEPARIN (PORCINE) 25000 UT/250ML-% IV SOLN
800.0000 [IU]/h | INTRAVENOUS | Status: DC
  Administered 2020-07-05 – 2020-07-07 (×2): 800 [IU]/h via INTRAVENOUS
  Filled 2020-07-05 (×2): qty 250

## 2020-07-05 MED ORDER — B COMPLEX PO TABS: 1.0000 | ORAL_TABLET | Freq: Every day | ORAL | Status: DC

## 2020-07-05 MED ORDER — FUROSEMIDE 10 MG/ML IJ SOLN
40.0000 mg | Freq: Two times a day (BID) | INTRAMUSCULAR | Status: DC
  Administered 2020-07-06: 40 mg via INTRAVENOUS
  Filled 2020-07-05: qty 4

## 2020-07-05 MED ORDER — NITROGLYCERIN 2 % TD OINT
0.5000 [in_us] | TOPICAL_OINTMENT | Freq: Four times a day (QID) | TRANSDERMAL | Status: DC
  Administered 2020-07-05 – 2020-07-08 (×10): 0.5 [in_us] via TOPICAL
  Filled 2020-07-05: qty 30

## 2020-07-05 MED ORDER — ASPIRIN EC 81 MG PO TBEC
81.0000 mg | DELAYED_RELEASE_TABLET | Freq: Every day | ORAL | Status: DC
  Administered 2020-07-06 – 2020-07-07 (×2): 81 mg via ORAL
  Filled 2020-07-05 (×2): qty 1

## 2020-07-05 MED ORDER — ASPIRIN 300 MG RE SUPP
300.0000 mg | RECTAL | Status: AC
  Filled 2020-07-05: qty 1

## 2020-07-05 MED ORDER — ASPIRIN 81 MG PO CHEW
324.0000 mg | CHEWABLE_TABLET | ORAL | Status: AC
  Administered 2020-07-05: 324 mg via ORAL
  Filled 2020-07-05: qty 4

## 2020-07-05 NOTE — H&P (Addendum)
History and Physical  Pt seen and evaluated in office and had direct admit to [facility] for chest pain.   Date:  07/05/2020   ID:  Morgan Boyer, DOB 1932-03-10, MRN [id number]  PCP:  Aretta Nip, MD            Cardiologist:  Dr. Angelena Form        Chief Complaint  Patient presents with  . Mitral Regurgitation  . Chest Pain      History of Present Illness: Morgan Boyer is a 87 y.o. female who presents for mitral regurg  She has a history of HTN, HLD, memory disorder, peripheral neuropathy, OA GERD, depression, CVA, atrial fibrillationand moderate to severemitral regurgitationwhohere today for cardiac follow up. She was seen as a new patient in 2016 for evaluation of dizziness, near syncope. She described episodes of dizziness occurring at rest and associated with nausea and diarrhea. No syncope. She had been seen by Neurology and EEG was ok. MRI Brain with chronic changes but no acute stroke. Carotid dopplers with mild bilateral stenosis. Event monitor with sinus rhythm and 1st degree AV block but no other arrhythmias or high grade heart block. Echo December 2018 with ERDE=08-14%, grade 2 diastolic dysfunction. Moderate mitral regurgitation. She had chronic back pain and leg pain due to spinal stenosis but this resolved after her surgery April 2019. [facility] with LUE weakness suddenlyand she was given tPA for acute CVA. There was evidence ofright MCA and left posterior MCA infarcts - cardioembolic pattern - highly concerning for occult afib. She underwent echo showing EF 60-65%, pseudonormalization, moderate LAE, moderate mitral regurgitation/tricuspid regurgitation, cannot rule out PFO, aneurysmal interatrial septum. There was a possible mobile density in the LAA area so she underwent TEE 10/31/18 showing EF 60-65%, no masses of LAA, no PFO, partially flail leaflet of the mitral valve with severe mitral regurgitation. She had a loop recorder implanted  by EP. Neurology recommended ASA 81 mg daily and plavix daily for 3 weeks then plavix alone.She was found to have atrial fibrillation and was started on Eliquis. She has been followed in the atrial fib clinic.  Loop placed 10/31/18 for cryptogenic stroke.  Last evaluated 06/21/20 and + for afib and pt is on [place]. Battery status ok normal device function, 2 a fib events lasted 4 min.  Otherwise stable.   Last visit with Dr. Angelena Form 06/17/19 . At that time she had more LE edema.follow up echo with EF 55-60%, no RWMA, mild concentric LVH, G3DD  And severely elevated pulmonary artery systolic pressure.  LA size was moderately dilated.  Mod to severe MVR.  Moderate TR,  lasix increased a year ago.   Today she complains of chest pain that began last evening, does radiate to back. No SOB no diaphoresis some nausea.  She did not sleep last pm due to pain.  Still present.       Past Medical History:  Diagnosis Date  . Abnormality of gait   . Adenomatous colon polyp   . Arthritis   . Atrial fibrillation ([place])   . Back pain   . Cancer ([place])    skin cancers  . Depression   . Diverticulosis   . DJD (degenerative joint disease) of knee   . Esophageal reflux   . H/O hiatal hernia   . Heart murmur   . Herpes zoster    throat  . History of shingles    left throat 07/2007  . Hyperlipidemia   .  Hypertension   . Memory changes 01/14/2013  . Neuromuscular disorder ([place])    peripheral neuropathy  . Nocturnal leg cramps 07/15/2014  . Peripheral neuropathy   . Polyneuropathy in other diseases classified elsewhere ([place]) 01/14/2013  . Severe mitral regurgitation   . Stroke (cerebrum) [facility])          Past Surgical History:  Procedure Laterality Date  . ABDOMINAL HYSTERECTOMY    . APPENDECTOMY    . BACK SURGERY  07/2017  . BUBBLE STUDY  10/31/2018   Procedure: BUBBLE STUDY;  Surgeon: Fay Records, MD;  Location: [facility] ENDOSCOPY;  Service: Cardiovascular;;  . COLON  SURGERY     2001 diverticulitis with infection ..drained surgically .  Marland Kitchen EYE SURGERY     cat ext bil   . JOINT REPLACEMENT     right  . KNEE ARTHROSCOPY  11/08/2011  . LOOP RECORDER INSERTION N/A 10/31/2018   Procedure: LOOP RECORDER INSERTION;  Surgeon: Constance Haw, MD;  Location: [facility] CV LAB;  Service: Cardiovascular;  Laterality: N/A;  . TEE WITHOUT CARDIOVERSION N/A 10/31/2018   Procedure: TRANSESOPHAGEAL ECHOCARDIOGRAM (TEE);  Surgeon: Fay Records, MD;  Location: [facility] ENDOSCOPY;  Service: Cardiovascular;  Laterality: N/A;  . TONSILLECTOMY    . TOTAL KNEE ARTHROPLASTY  11/07/2011   Procedure: TOTAL KNEE ARTHROPLASTY;  Surgeon: Ninetta Lights, MD;  Location: [facility];  Service: Orthopedics;  Laterality: Left;  . TUBAL LIGATION    . WRIST SURGERY     for skin cancer           Current Outpatient Medications  Medication Sig Dispense Refill  . acetaminophen (TYLENOL) 325 MG tablet Take 1-2 tablets (325-650 mg total) by mouth every 4 (four) hours as needed for mild pain.    Marland Kitchen b complex vitamins tablet Take 1 tablet by mouth daily.    . baclofen 5 MG TABS Take 5 mg by mouth at bedtime. 30 tablet 5  . Calcium Citrate (CITRACAL PO) Take 1 tablet by mouth 2 (two) times daily. + D3    . diclofenac Sodium (VOLTAREN) 1 % GEL as needed.     Marland Kitchen ELIQUIS 5 MG TABS tablet Take 1 tablet by mouth twice daily 180 tablet 1  . esomeprazole (NEXIUM) 20 MG capsule 1 capsule    . furosemide (LASIX) 20 MG tablet Take 2 tablets (40 mg total) by mouth daily. 180 tablet 3  . gabapentin (NEURONTIN) 300 MG capsule Take 1 capsule (300 mg total) by mouth at bedtime. 30 capsule 5  . hydrocortisone 2.5 % cream Apply 1 application topically daily as needed (rash).   11  . lisinopril (ZESTRIL) 20 MG tablet Take 1 tablet (20 mg total) by mouth 2 (two) times daily. Please keep upcoming appt in April 2022 before anymore refills. Thank you Final Attempt 180 tablet 0  .  MELATONIN PO Take 5 mg by mouth daily in the afternoon. Takes one gummy daily    . metoprolol tartrate (LOPRESSOR) 25 MG tablet Take 1/2 (one-half) tablet by mouth twice daily 90 tablet 3  . Polyethyl Glycol-Propyl Glycol (SYSTANE OP) Place 1 drop into both eyes 3 (three) times daily as needed (for dry eyes).     . simvastatin (ZOCOR) 20 MG tablet Take 20 mg by mouth every evening.     No current facility-administered medications for this visit.    Allergies:   Septra [sulfamethoxazole-trimethoprim] and Lotensin [benazepril hcl]    Social History:  The patient  reports that she has  never smoked. She has never used smokeless tobacco. She reports that she does not drink alcohol and does not use drugs.   Family History:  The patient's family history includes Congestive Heart Failure in her mother; Diabetes in her brother; Heart attack (age of onset: 14) in her brother; Stroke (age of onset: 64) in her father.    ROS:  General:no colds or fevers, no weight changes Skin:no rashes or ulcers HEENT:no blurred vision, no congestion CV:see HPI PUL:see HPI GI:no diarrhea constipation or melena, no indigestion GU:no hematuria, no dysuria MS:no joint pain, no claudication Neuro:no syncope, no lightheadedness Endo:no diabetes, no thyroid disease     Wt Readings from Last 3 Encounters:  07/05/20 141 lb 9.6 oz (64.2 kg)  06/01/20 139 lb (63 kg)  10/22/19 138 lb (62.6 kg)     PHYSICAL EXAM: VS:  BP 136/86   Pulse 67   Ht _0  (1.575 m)   Wt 141 lb 9.6 oz (64.2 kg)   SpO2 99%   BMI 25.90 kg/m  , BMI Body mass index is 25.9 kg/m. General:Pleasant affect, NAD Skin:Warm and dry, brisk capillary refill HEENT:normocephalic, sclera clear, mucus membranes moist Neck:supple, + JVD, no bruits  Heart:S1S2 RRR with 3/6 systolic murmur, no gallup, rub or click Lungs:clear without rales, rhonchi, or wheezes HBZ:JIRC, non tender, + BS, do not palpate liver spleen or masses Ext:2+  lower ext edema Rt > Lt , 2+ pedal pulses, 2+ radial pulses Neuro:alert and oriented X 3 though does have memory issues, MAE, follows commands, + facial symmetry    EKG:  EKG is ordered today. The ekg ordered today demonstrates SR with LAFB old septal infarct.    Recent Labs: No results found for requested labs within last 8760 hours.    Lipid Panel Labs (Brief)          Component Value Date/Time   CHOL 149 10/26/2018 0541   TRIG 52 10/26/2018 0541   HDL 55 10/26/2018 0541   CHOLHDL 2.7 10/26/2018 0541   VLDL 10 10/26/2018 0541   LDLCALC 84 10/26/2018 0541         Other studies Reviewed: Additional studies/ records that were reviewed today include: . Echo July 2020: 1. The left ventricle has normal systolic function with an ejection  fraction of 60-65%. The cavity size was normal. There is moderately  increased left ventricular wall thickness. Left ventricular diastolic  Doppler parameters are consistent with  pseudonormalization. Elevated left atrial and left ventricular  end-diastolic pressures The E/e' is >15. There is incoordinate septal  motion. No evidence of left ventricular regional wall motion  abnormalities.  2. Left atrial size was moderately dilated.  3. The mitral valve is abnormal. Mild thickening of the mitral valve  leaflet. There is mild to moderate mitral annular calcification present.  Mitral valve regurgitation is moderate by color flow Doppler.  4. The tricuspid valve is grossly normal. Tricuspid valve regurgitation  is moderate.  5. The aortic valve is tricuspid. Moderate calcification of the aortic  valve. No stenosis of the aortic valve.  6. The aorta is normal in size and structure.  7. The inferior vena cava was normal in size with <50% respiratory  variability.  8. Cannot rule out PFO.  9. 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   SUMMARY    LVEF 60-65%, moderate LVH, normal wall motion, grade 2 DD, elevated   LV filling pressure, MAC with moderate MR, moderate LAE, moderate  TR, RVSP 72 mmHg (moderate to severe  pulmonary hypertension), IVC  non-dilated but does not collapse, there is an atrial septal  aneurysm (cannot exclude PFO) - although not well-visualized, there  appears to be a mobile density in the left atrium in the area of the  appendage that may be thrombus in the left atrial appendage (seen in  the parasternal long-axis view). Recommend TEE in the setting of  stroke to better visualize LAA and interatrial septum. I was asked  to read this study on 7/28 as it was discovered this morning to be  unread over the weekend (performed on 7/26). I have contacted Dr. Erlinda Hong  with neurology to relay the findings at 11:34 am on 7/28  TEE 10/31/18: 1. The left ventricle has normal systolic function, with an ejection  fraction of 60-65%.  2. LA, LAA without masses.  3. NO PFO by color doppler or as tested with injection of agitated  saline.  4. The mitral valve is abnormal.  5. Partially flail posterior leaflet of the mitral valve (P2) with severe  mitral regurgitation directed anteriorly and around LA.  ECHO 07/22/19  IMPRESSIONS    1. Left ventricular ejection fraction, by estimation, is 55 to 60%. The  left ventricle has normal function. The left ventricle has no regional  wall motion abnormalities. There is mild concentric left ventricular  hypertrophy. Left ventricular diastolic  parameters are consistent with Grade III diastolic dysfunction  (restrictive). Elevated left ventricular end-diastolic pressure.  2. Right ventricular systolic function is normal. The right ventricular  size is normal. There is severely elevated pulmonary artery systolic  pressure.  3. Left atrial size was moderately dilated.  4. The mitral valve is abnormal. Moderate to severe mitral valve  regurgitation.  5. Tricuspid valve regurgitation is moderate.  6. The aortic valve is tricuspid. Aortic  valve regurgitation is trivial.  Mild aortic valve sclerosis is present, with no evidence of aortic valve  stenosis.  7. The inferior vena cava is normal in size with greater than 50%  respiratory variability, suggesting right atrial pressure of 3 mmHg.   Comparison(s): No significant change from prior study.   Conclusion(s)/Recommendation(s): Severe prolapse of P2 leaflet of mitral  valve, leading to moderate-severe anterior directed MR jet. Appears  similar to prior. Restrictive (grade 3) diastolic dysfunction with  severely elevated pulmonary pressures.   FINDINGS  Left Ventricle: Left ventricular ejection fraction, by estimation, is 55  to 60%. The left ventricle has normal function. The left ventricle has no  regional wall motion abnormalities. The left ventricular internal cavity  size was normal in size. There is  mild concentric left ventricular hypertrophy. Left ventricular diastolic  parameters are consistent with Grade III diastolic dysfunction  (restrictive). Elevated left ventricular end-diastolic pressure.   Right Ventricle: The right ventricular size is normal. No increase in  right ventricular wall thickness. Right ventricular systolic function is  normal. There is severely elevated pulmonary artery systolic pressure. The  tricuspid regurgitant velocity is  4.06 m/s, and with an assumed right atrial pressure of 3 mmHg, the  estimated right ventricular systolic pressure is 76.2 mmHg.   Left Atrium: Left atrial size was moderately dilated.   Right Atrium: Right atrial size was normal in size.   Pericardium: Trivial pericardial effusion is present.   Mitral Valve: The mitral valve is abnormal. There is severe holosystolic  prolapse of the middle scallop of the posterior leaflet of the mitral  valve. There is mild thickening of the mitral valve leaflet(s). There is  mild calcification  of the mitral valve  leaflet(s). Severe mitral annular calcification. Moderate  to severe  mitral valve regurgitation. MV peak gradient, 14.0 mmHg. The mean mitral  valve gradient is 3.0 mmHg.   Tricuspid Valve: The tricuspid valve is normal in structure. Tricuspid  valve regurgitation is moderate . No evidence of tricuspid stenosis.   Aortic Valve: The aortic valve is tricuspid. Aortic valve regurgitation is  trivial. Mild aortic valve sclerosis is present, with no evidence of  aortic valve stenosis.  ASSESSMENT AND PLAN:  1.  Chest pain, began yesterday and does go to back.  Some nausea with it.  No ischemic workup so will plan for cardiac cath on Thursday.   She does have mod to severe MR and this may be due to MR, check troponins, IV heparin and hold eliquis,  Check echo.   Continue loperssor and zestril   Dr. Radford Pax has seen the pt and evaluated as well.    2. Mod to severe MR has been a year since last echo will recheck tomorrow.    3. Acute on chronic diastolic HF with Lower ext edema, check CXR and place on IV lasix for now.   4. PAF with 4 min episodes, has ILR. On eliquis and currently in SR  5.  Hx CVA on eliquis  6.  HLD on statin continue      Current medicines are reviewed with the patient today.  The patient Has no concerns regarding medicines.  The following changes have been made:  See above Labs/ tests ordered today include:see above  Disposition:   FU:  see above  Signed, Cecilie Kicks, NP  07/05/2020 4:31 PM    [facility] [address], [place], [place] Phone: [phone]; Fax: [phone]

## 2020-07-05 NOTE — Discharge Instructions (Signed)

## 2020-07-05 NOTE — Patient Instructions (Signed)
Medication Instructions:  Your physician recommends that you continue on your current medications as directed. Please refer to the Current Medication list given to you today.  *If you need a refill on your cardiac medications before your next appointment, please call your pharmacy*   Lab Work: none   Testing/Procedures: none   Follow-Up: At [facility], you and your health needs are our priority.  As part of our continuing mission to provide you with exceptional heart care, we have created designated Provider Care Teams.  These Care Teams include your primary Cardiologist (physician) and Advanced Practice Providers (APPs -  Physician Assistants and Nurse Practitioners) who all work together to provide you with the care you need, when you need it.  Your next appointment:    As directed by hospital

## 2020-07-05 NOTE — Progress Notes (Signed)
Cardiology Office Note   Date:  07/05/2020   ID:  Zakkiyya, Barno 12-Oct-1931, MRN [id number]  PCP:  Aretta Nip, MD  Cardiologist:  Dr. Angelena Form     Chief Complaint  Patient presents with  . Mitral Regurgitation  . Chest Pain      History of Present Illness: Morgan Boyer is a 87 y.o. female who presents for mitral regurg  She has a history of HTN, HLD, memory disorder, peripheral neuropathy, OA GERD, depression, CVA, atrial fibrillation and moderate to severe mitral regurgitation who here today for cardiac follow up. She was seen as a new patient in 2016 for evaluation of dizziness, near syncope.  She described episodes of dizziness occurring at rest and associated with nausea and diarrhea. No syncope. She had been seen by Neurology and EEG was ok. MRI Brain with chronic changes but no acute stroke. Carotid dopplers with mild bilateral stenosis. Event monitor with sinus rhythm and 1st degree AV block but no other arrhythmias or high grade heart block. Echo December 2018 with LGXQ=[id number]%, grade 2 diastolic dysfunction. Moderate mitral regurgitation. She had chronic back pain and leg pain due to spinal stenosis but this resolved after her surgery April 2019. She was admitted to [facility] 10/26/2018 with LUE weakness suddenly and she was given tPA for acute CVA. There was evidence of right MCA and left posterior MCA infarcts - cardioembolic pattern - highly concerning for occult afib. She underwent echo showing EF 60-65%, pseudonormalization, moderate LAE, moderate mitral regurgitation/tricuspid regurgitation, cannot rule out PFO, aneurysmal interatrial septum. There was a possible mobile density in the LAA area so she underwent TEE 10/31/18 showing EF 60-65%, no masses of LAA, no PFO, partially flail leaflet of the mitral valve with severe mitral regurgitation.  She had a loop recorder implanted by EP. Neurology recommended ASA 81 mg daily and plavix daily for 3 weeks then plavix alone.  She was found to have atrial fibrillation and was started on Eliquis. She has been followed in the atrial fib clinic.   Loop placed 10/31/18 for cryptogenic stroke.  Last evaluated 06/21/20 and + for afib and pt is on [place]. Battery status ok normal device function, 2 a fib events lasted 4 min.  Otherwise stable.   Last visit with Dr. Angelena Form 06/17/19 . At that time she had more LE edema. follow up echo with EF 55-60%, no RWMA, mild concentric LVH, G3DD  And severely elevated pulmonary artery systolic pressure.  LA size was moderately dilated.  Mod to severe MVR.  Moderate TR,  lasix increased a year ago.   Today she complains of chest pain that began last evening, does radiate to back. No SOB no diaphoresis some nausea.  She did not sleep last pm due to pain.  Still present.   Past Medical History:  Diagnosis Date  . Abnormality of gait   . Adenomatous colon polyp   . Arthritis   . Atrial fibrillation ([place])   . Back pain   . Cancer ([place])    skin cancers  . Depression   . Diverticulosis   . DJD (degenerative joint disease) of knee   . Esophageal reflux   . H/O hiatal hernia   . Heart murmur   . Herpes zoster    throat  . History of shingles    left throat 07/2007  . Hyperlipidemia   . Hypertension   . Memory changes 01/14/2013  . Neuromuscular disorder ([place])    peripheral neuropathy  .  Nocturnal leg cramps 07/15/2014  . Peripheral neuropathy   . Polyneuropathy in other diseases classified elsewhere ([place]) 01/14/2013  . Severe mitral regurgitation   . Stroke (cerebrum) [facility])     Past Surgical History:  Procedure Laterality Date  . ABDOMINAL HYSTERECTOMY    . APPENDECTOMY    . BACK SURGERY  07/2017  . BUBBLE STUDY  10/31/2018   Procedure: BUBBLE STUDY;  Surgeon: Fay Records, MD;  Location: [facility] ENDOSCOPY;  Service: Cardiovascular;;  . COLON SURGERY     2001 diverticulitis with infection ..drained surgically .  Marland Kitchen EYE SURGERY     cat ext bil   . JOINT REPLACEMENT     right  .  KNEE ARTHROSCOPY  11/08/2011  . LOOP RECORDER INSERTION N/A 10/31/2018   Procedure: LOOP RECORDER INSERTION;  Surgeon: Constance Haw, MD;  Location: [place] CV LAB;  Service: Cardiovascular;  Laterality: N/A;  . TEE WITHOUT CARDIOVERSION N/A 10/31/2018   Procedure: TRANSESOPHAGEAL ECHOCARDIOGRAM (TEE);  Surgeon: Fay Records, MD;  Location: [facility] ENDOSCOPY;  Service: Cardiovascular;  Laterality: N/A;  . TONSILLECTOMY    . TOTAL KNEE ARTHROPLASTY  11/07/2011   Procedure: TOTAL KNEE ARTHROPLASTY;  Surgeon: Ninetta Lights, MD;  Location: [facility];  Service: Orthopedics;  Laterality: Left;  . TUBAL LIGATION    . WRIST SURGERY     for skin cancer     Current Outpatient Medications  Medication Sig Dispense Refill  . acetaminophen (TYLENOL) 325 MG tablet Take 1-2 tablets (325-650 mg total) by mouth every 4 (four) hours as needed for mild pain.    Marland Kitchen b complex vitamins tablet Take 1 tablet by mouth daily.    . baclofen 5 MG TABS Take 5 mg by mouth at bedtime. 30 tablet 5  . Calcium Citrate (CITRACAL PO) Take 1 tablet by mouth 2 (two) times daily. + D3    . diclofenac Sodium (VOLTAREN) 1 % GEL as needed.     Marland Kitchen ELIQUIS 5 MG TABS tablet Take 1 tablet by mouth twice daily 180 tablet 1  . esomeprazole (NEXIUM) 20 MG capsule 1 capsule    . furosemide (LASIX) 20 MG tablet Take 2 tablets (40 mg total) by mouth daily. 180 tablet 3  . gabapentin (NEURONTIN) 300 MG capsule Take 1 capsule (300 mg total) by mouth at bedtime. 30 capsule 5  . hydrocortisone 2.5 % cream Apply 1 application topically daily as needed (rash).   11  . lisinopril (ZESTRIL) 20 MG tablet Take 1 tablet (20 mg total) by mouth 2 (two) times daily. Please keep upcoming appt in April 2022 before anymore refills. Thank you Final Attempt 180 tablet 0  . MELATONIN PO Take 5 mg by mouth daily in the afternoon. Takes one gummy daily    . metoprolol tartrate (LOPRESSOR) 25 MG tablet Take 1/2 (one-half) tablet by mouth twice daily 90 tablet 3  .  Polyethyl Glycol-Propyl Glycol (SYSTANE OP) Place 1 drop into both eyes 3 (three) times daily as needed (for dry eyes).     . simvastatin (ZOCOR) 20 MG tablet Take 20 mg by mouth every evening.     No current facility-administered medications for this visit.    Allergies:   Septra [sulfamethoxazole-trimethoprim] and Lotensin [benazepril hcl]    Social History:  The patient  reports that she has never smoked. She has never used smokeless tobacco. She reports that she does not drink alcohol and does not use drugs.   Family History:  The patient's family history  includes Congestive Heart Failure in her mother; Diabetes in her brother; Heart attack (age of onset: 4) in her brother; Stroke (age of onset: 84) in her father.    ROS:  General:no colds or fevers, no weight changes Skin:no rashes or ulcers HEENT:no blurred vision, no congestion CV:see HPI PUL:see HPI GI:no diarrhea constipation or melena, no indigestion GU:no hematuria, no dysuria MS:no joint pain, no claudication Neuro:no syncope, no lightheadedness Endo:no diabetes, no thyroid disease  Wt Readings from Last 3 Encounters:  07/05/20 141 lb 9.6 oz (64.2 kg)  06/01/20 139 lb (63 kg)  10/22/19 138 lb (62.6 kg)     PHYSICAL EXAM: VS:  BP 136/86   Pulse 67   Ht _0  (1.575 m)   Wt 141 lb 9.6 oz (64.2 kg)   SpO2 99%   BMI 25.90 kg/m  , BMI Body mass index is 25.9 kg/m. General:Pleasant affect, NAD Skin:Warm and dry, brisk capillary refill HEENT:normocephalic, sclera clear, mucus membranes moist Neck:supple, no JVD, no bruits  Heart:S1S2 RRR with 3/6 systolic murmur, no gallup, rub or click Lungs:clear without rales, rhonchi, or wheezes HCW:CBJS, non tender, + BS, do not palpate liver spleen or masses Ext:2+ lower ext edema Rt > Lt , 2+ pedal pulses, 2+ radial pulses Neuro:alert and oriented X 3 though does have memory issues, MAE, follows commands, + facial symmetry    EKG:  EKG is ordered today. The ekg  ordered today demonstrates SR with LAFB old septal infarct.    Recent Labs: No results found for requested labs within last 8760 hours.    Lipid Panel    Component Value Date/Time   CHOL 149 10/26/2018 0541   TRIG 52 10/26/2018 0541   HDL 55 10/26/2018 0541   CHOLHDL 2.7 10/26/2018 0541   VLDL 10 10/26/2018 0541   LDLCALC 84 10/26/2018 0541       Other studies Reviewed: Additional studies/ records that were reviewed today include: . Echo July 2020: 1. The left ventricle has normal systolic function with an ejection  fraction of 60-65%. The cavity size was normal. There is moderately  increased left ventricular wall thickness. Left ventricular diastolic  Doppler parameters are consistent with  pseudonormalization. Elevated left atrial and left ventricular  end-diastolic pressures The E/e' is >15. There is incoordinate septal  motion. No evidence of left ventricular regional wall motion  abnormalities.  2. Left atrial size was moderately dilated.  3. The mitral valve is abnormal. Mild thickening of the mitral valve  leaflet. There is mild to moderate mitral annular calcification present.  Mitral valve regurgitation is moderate by color flow Doppler.  4. The tricuspid valve is grossly normal. Tricuspid valve regurgitation  is moderate.  5. The aortic valve is tricuspid. Moderate calcification of the aortic  valve. No stenosis of the aortic valve.  6. The aorta is normal in size and structure.  7. The inferior vena cava was normal in size with <50% respiratory  variability.  8. Cannot rule out PFO.  9. 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   SUMMARY    LVEF 60-65%, moderate LVH, normal wall motion, grade 2 DD, elevated  LV filling pressure, MAC with moderate MR, moderate LAE, moderate  TR, RVSP 72 mmHg (moderate to severe pulmonary hypertension), IVC  non-dilated but does not collapse, there is an atrial septal  aneurysm (cannot exclude PFO) - although not  well-visualized, there  appears to be a mobile density in the left atrium in the area of the  appendage that  may be thrombus in the left atrial appendage (seen in  the parasternal long-axis view). Recommend TEE in the setting of  stroke to better visualize LAA and interatrial septum. I was asked  to read this study on 7/28 as it was discovered this morning to be  unread over the weekend (performed on 7/26). I have contacted Dr. Erlinda Hong  with neurology to relay the findings at 11:34 am on 7/28  TEE 10/31/18: 1. The left ventricle has normal systolic function, with an ejection  fraction of 60-65%.  2. LA, LAA without masses.  3. NO PFO by color doppler or as tested with injection of agitated  saline.  4. The mitral valve is abnormal.  5. Partially flail posterior leaflet of the mitral valve (P2) with severe  mitral regurgitation directed anteriorly and around LA.  ECHO 07/22/19  IMPRESSIONS    1. Left ventricular ejection fraction, by estimation, is 55 to 60%. The  left ventricle has normal function. The left ventricle has no regional  wall motion abnormalities. There is mild concentric left ventricular  hypertrophy. Left ventricular diastolic  parameters are consistent with Grade III diastolic dysfunction  (restrictive). Elevated left ventricular end-diastolic pressure.  2. Right ventricular systolic function is normal. The right ventricular  size is normal. There is severely elevated pulmonary artery systolic  pressure.  3. Left atrial size was moderately dilated.  4. The mitral valve is abnormal. Moderate to severe mitral valve  regurgitation.  5. Tricuspid valve regurgitation is moderate.  6. The aortic valve is tricuspid. Aortic valve regurgitation is trivial.  Mild aortic valve sclerosis is present, with no evidence of aortic valve  stenosis.  7. The inferior vena cava is normal in size with greater than 50%  respiratory variability, suggesting right atrial pressure  of 3 mmHg.   Comparison(s): No significant change from prior study.   Conclusion(s)/Recommendation(s): Severe prolapse of P2 leaflet of mitral  valve, leading to moderate-severe anterior directed MR jet. Appears  similar to prior. Restrictive (grade 3) diastolic dysfunction with  severely elevated pulmonary pressures.   FINDINGS  Left Ventricle: Left ventricular ejection fraction, by estimation, is 55  to 60%. The left ventricle has normal function. The left ventricle has no  regional wall motion abnormalities. The left ventricular internal cavity  size was normal in size. There is  mild concentric left ventricular hypertrophy. Left ventricular diastolic  parameters are consistent with Grade III diastolic dysfunction  (restrictive). Elevated left ventricular end-diastolic pressure.   Right Ventricle: The right ventricular size is normal. No increase in  right ventricular wall thickness. Right ventricular systolic function is  normal. There is severely elevated pulmonary artery systolic pressure. The  tricuspid regurgitant velocity is  4.06 m/s, and with an assumed right atrial pressure of 3 mmHg, the  estimated right ventricular systolic pressure is 30.1 mmHg.   Left Atrium: Left atrial size was moderately dilated.   Right Atrium: Right atrial size was normal in size.   Pericardium: Trivial pericardial effusion is present.   Mitral Valve: The mitral valve is abnormal. There is severe holosystolic  prolapse of the middle scallop of the posterior leaflet of the mitral  valve. There is mild thickening of the mitral valve leaflet(s). There is  mild calcification of the mitral valve  leaflet(s). Severe mitral annular calcification. Moderate to severe  mitral valve regurgitation. MV peak gradient, 14.0 mmHg. The mean mitral  valve gradient is 3.0 mmHg.   Tricuspid Valve: The tricuspid valve is normal in structure. Tricuspid  valve regurgitation is moderate . No evidence of  tricuspid stenosis.   Aortic Valve: The aortic valve is tricuspid. Aortic valve regurgitation is  trivial. Mild aortic valve sclerosis is present, with no evidence of  aortic valve stenosis.  ASSESSMENT AND PLAN:  1.  Chest pain, began yesterday and does go to back.  Some nausea with it.  No ischemic workup so will plan for cardiac cath on Thursday.   She does have mod to severe MR and this may be due to MR, check troponins, IV heparin and hold eliquis,  Check echo.   Continue loperssor and zestril   Dr. Radford Pax has seen the pt and evaluated as well.    2. Mod to severe MR has been a year since last echo will recheck tomorrow.    3. Acute on chronic diastolic HF with Lower ext edema, check CXR and place on IV lasix for now.   4. PAF with 4 min episodes, has ILR. On eliquis and currently in SR  5.  Hx CVA on eliquis  6.  HLD on statin continue      Current medicines are reviewed with the patient today.  The patient Has no concerns regarding medicines.  The following changes have been made:  See above Labs/ tests ordered today include:see above  Disposition:   FU:  see above  Signed, Cecilie Kicks, NP  07/05/2020 4:31 PM    [facility] [place] [place], [place] Phone: [phone]; Fax: [phone]

## 2020-07-05 NOTE — Progress Notes (Signed)
ANTICOAGULATION CONSULT NOTE - Initial Consult  Pharmacy Consult for heparin Indication: chest pain/ACS  Allergies  Allergen Reactions  . Septra [Sulfamethoxazole-Trimethoprim] Hives  . Lotensin [Benazepril Hcl] Other (See Comments)    NOT KNOWN    Patient Measurements:   Heparin Dosing Weight: 64kg  Vital Signs: BP: 136/86 (04/05 1609) Pulse Rate: 67 (04/05 1609)  Labs: No results for input(s): HGB, HCT, PLT, APTT, LABPROT, INR, HEPARINUNFRC, HEPRLOWMOCWT, CREATININE, CKTOTAL, CKMB, TROPONINIHS in the last 72 hours.  CrCl cannot be calculated (Patient's most recent lab result is older than the maximum 21 days allowed.).   Medical History: Past Medical History:  Diagnosis Date  . Abnormality of gait   . Adenomatous colon polyp   . Arthritis   . Atrial fibrillation ([place])   . Back pain   . Cancer ([place])    skin cancers  . Depression   . Diverticulosis   . DJD (degenerative joint disease) of knee   . Esophageal reflux   . H/O hiatal hernia   . Heart murmur   . Herpes zoster    throat  . History of shingles    left throat 07/2007  . Hyperlipidemia   . Hypertension   . Memory changes 01/14/2013  . Neuromuscular disorder ([place])    peripheral neuropathy  . Nocturnal leg cramps 07/15/2014  . Peripheral neuropathy   . Polyneuropathy in other diseases classified elsewhere ([place]) 01/14/2013  . Severe mitral regurgitation   . Stroke (cerebrum) [facility])    Assessment: 87 year old female seen in cardiology office, direct admitted for rule out acs/chest pain. Patient with history of afib on apixban prior to admit. New orders received to transition to IV heparin.   Will need to monitor aptts given recent apixaban use until they correlate with daily heparin levels.    Goal of Therapy:  Heparin level 0.3-0.7 units/ml aPTT 66-102 seconds Monitor platelets by anticoagulation protocol: Yes   Plan:  Start heparin infusion at 800 units/hr Check anti-Xa level in 8 hours and  daily while on heparin Continue to monitor H&H and platelets  Follow up cardiac plan  Erin Hearing PharmD., BCPS Clinical Pharmacist 07/05/2020 5:12 PM

## 2020-07-06 ENCOUNTER — Inpatient Hospital Stay (HOSPITAL_COMMUNITY): Payer: PPO

## 2020-07-06 DIAGNOSIS — I200 Unstable angina: Secondary | ICD-10-CM

## 2020-07-06 DIAGNOSIS — I509 Heart failure, unspecified: Secondary | ICD-10-CM

## 2020-07-06 DIAGNOSIS — I351 Nonrheumatic aortic (valve) insufficiency: Secondary | ICD-10-CM

## 2020-07-06 DIAGNOSIS — I480 Paroxysmal atrial fibrillation: Secondary | ICD-10-CM

## 2020-07-06 DIAGNOSIS — I361 Nonrheumatic tricuspid (valve) insufficiency: Secondary | ICD-10-CM

## 2020-07-06 DIAGNOSIS — I340 Nonrheumatic mitral (valve) insufficiency: Secondary | ICD-10-CM

## 2020-07-06 LAB — LIPID PANEL
Cholesterol: 109 mg/dL (ref 0–200)
HDL: 48 mg/dL (ref >40)
LDL Cholesterol: 53 mg/dL (ref 0–99)
Total CHOL/HDL Ratio: 2.3 RATIO
Triglycerides: 38 mg/dL (ref <150)
VLDL: 8 mg/dL (ref 0–40)

## 2020-07-06 LAB — ECHOCARDIOGRAM COMPLETE
AR max vel: 1.21 cm2
AV Area VTI: 1.22 cm2
AV Area mean vel: 1.16 cm2
AV Mean grad: 8 mmHg
AV Peak grad: 14.6 mmHg
Ao pk vel: 1.91 m/s
Area-P 1/2: 2.97 cm2
Height: 62 in
MV M vel: 4.82 m/s
MV Peak grad: 92.9 mmHg
Radius: 1 cm
S' Lateral: 2.8 cm
Weight: 2296.31 oz

## 2020-07-06 LAB — BASIC METABOLIC PANEL
Anion gap: 7 (ref 5–15)
BUN: 12 mg/dL (ref 8–23)
CO2: 30 mmol/L (ref 22–32)
Calcium: 8.7 mg/dL — ABNORMAL LOW (ref 8.9–10.3)
Chloride: 103 mmol/L (ref 98–111)
Creatinine, Ser: 0.9 mg/dL (ref 0.44–1.00)
GFR, Estimated: >60 mL/min (ref >60)
Glucose, Bld: 150 mg/dL — ABNORMAL HIGH (ref 70–99)
Potassium: 3.5 mmol/L (ref 3.5–5.1)
Sodium: 140 mmol/L (ref 135–145)

## 2020-07-06 LAB — TROPONIN I (HIGH SENSITIVITY)
Troponin I (High Sensitivity): 15 ng/L (ref <18)
Troponin I (High Sensitivity): 16 ng/L (ref <18)

## 2020-07-06 LAB — CBC
HCT: 35.7 % — ABNORMAL LOW (ref 36.0–46.0)
Hemoglobin: 11.7 g/dL — ABNORMAL LOW (ref 12.0–15.0)
MCH: 29.3 pg (ref 26.0–34.0)
MCHC: 32.8 g/dL (ref 30.0–36.0)
MCV: 89.5 fL (ref 80.0–100.0)
Platelets: 162 10*3/uL (ref 150–400)
RBC: 3.99 MIL/uL (ref 3.87–5.11)
RDW: 14.2 % (ref 11.5–15.5)
WBC: 11.9 10*3/uL — ABNORMAL HIGH (ref 4.0–10.5)
nRBC: 0 % (ref 0.0–0.2)

## 2020-07-06 LAB — BRAIN NATRIURETIC PEPTIDE: B Natriuretic Peptide: 611.9 pg/mL — ABNORMAL HIGH (ref 0.0–100.0)

## 2020-07-06 LAB — MRSA PCR SCREENING: MRSA by PCR: NEGATIVE

## 2020-07-06 LAB — APTT
aPTT: 85 seconds — ABNORMAL HIGH (ref 24–36)
aPTT: 97 seconds — ABNORMAL HIGH (ref 24–36)

## 2020-07-06 LAB — SARS CORONAVIRUS 2 (TAT 6-24 HRS): SARS Coronavirus 2: NEGATIVE

## 2020-07-06 LAB — TSH: TSH: 1.977 u[IU]/mL (ref 0.350–4.500)

## 2020-07-06 MED ORDER — AMIODARONE HCL IN DEXTROSE 360-4.14 MG/200ML-% IV SOLN
60.0000 mg/h | INTRAVENOUS | Status: DC
  Filled 2020-07-06: qty 200

## 2020-07-06 MED ORDER — AMIODARONE LOAD VIA INFUSION
150.0000 mg | Freq: Once | INTRAVENOUS | Status: DC
  Filled 2020-07-06: qty 83.34

## 2020-07-06 MED ORDER — AMIODARONE HCL IN DEXTROSE 360-4.14 MG/200ML-% IV SOLN: 30.0000 mg/h | INTRAVENOUS | Status: DC

## 2020-07-06 MED ORDER — FUROSEMIDE 10 MG/ML IJ SOLN
40.0000 mg | Freq: Two times a day (BID) | INTRAMUSCULAR | Status: DC
  Administered 2020-07-06 – 2020-07-07 (×3): 40 mg via INTRAVENOUS
  Filled 2020-07-06 (×2): qty 4

## 2020-07-06 MED ORDER — FUROSEMIDE 10 MG/ML IJ SOLN
40.0000 mg | Freq: Two times a day (BID) | INTRAMUSCULAR | Status: DC
  Administered 2020-07-06: 40 mg via INTRAVENOUS
  Filled 2020-07-06: qty 4

## 2020-07-06 MED ORDER — ASPIRIN 81 MG PO CHEW
81.0000 mg | CHEWABLE_TABLET | ORAL | Status: AC
  Administered 2020-07-07: 81 mg via ORAL
  Filled 2020-07-06: qty 1

## 2020-07-06 MED ORDER — SODIUM CHLORIDE 0.9 % IV SOLN: INTRAVENOUS | Status: DC

## 2020-07-06 MED ORDER — SODIUM CHLORIDE 0.9% FLUSH
3.0000 mL | Freq: Two times a day (BID) | INTRAVENOUS | Status: DC
  Administered 2020-07-06 – 2020-07-15 (×15): 3 mL via INTRAVENOUS

## 2020-07-06 MED ORDER — SODIUM CHLORIDE 0.9% FLUSH: 3.0000 mL | INTRAVENOUS | Status: DC | PRN

## 2020-07-06 MED ORDER — POTASSIUM CHLORIDE CRYS ER 20 MEQ PO TBCR
40.0000 meq | EXTENDED_RELEASE_TABLET | Freq: Every day | ORAL | Status: DC
  Administered 2020-07-06: 40 meq via ORAL
  Filled 2020-07-06: qty 2

## 2020-07-06 MED ORDER — AMIODARONE HCL 200 MG PO TABS
200.0000 mg | ORAL_TABLET | Freq: Two times a day (BID) | ORAL | Status: DC
  Administered 2020-07-06 (×2): 200 mg via ORAL
  Filled 2020-07-06 (×2): qty 1

## 2020-07-06 MED ORDER — FUROSEMIDE 10 MG/ML IJ SOLN: 40.0000 mg | Freq: Two times a day (BID) | INTRAMUSCULAR | Status: DC

## 2020-07-06 MED ORDER — SODIUM CHLORIDE 0.9 % IV SOLN: 250.0000 mL | INTRAVENOUS | Status: DC | PRN

## 2020-07-06 NOTE — Progress Notes (Signed)
ANTICOAGULATION CONSULT NOTE - Initial Consult  Pharmacy Consult for heparin Indication: chest pain/ACS  Allergies  Allergen Reactions  . Septra [Sulfamethoxazole-Trimethoprim] Hives  . Lotensin [Benazepril Hcl] Other (See Comments)    NOT KNOWN    Patient Measurements: Weight: 65.1 kg (143 lb 8.3 oz) Heparin Dosing Weight: 64kg  Vital Signs: Temp: 98.3 F (36.8 C) (04/06 0300) Temp Source: Oral (04/06 0300) BP: 146/83 (04/06 0700) Pulse Rate: 70 (04/06 0700)  Labs: Recent Labs    07/05/20 1911 07/06/20 0337 07/06/20 0653  HGB 11.8*  --  11.7*  HCT 36.3  --  35.7*  PLT 156  --  162  APTT 34  --  85*  LABPROT 20.5*  --   --   INR 1.8*  --   --   HEPARINUNFRC >2.20*  --   --   CREATININE 0.79 0.90  --   TROPONINIHS 19*  --   --     Estimated Creatinine Clearance: 39.7 mL/min (by C-G formula based on SCr of 0.9 mg/dL).   Medical History: Past Medical History:  Diagnosis Date  . Abnormality of gait   . Adenomatous colon polyp   . Arthritis   . Atrial fibrillation ([place])   . Back pain   . Cancer ([place])    skin cancers  . Depression   . Diverticulosis   . DJD (degenerative joint disease) of knee   . Esophageal reflux   . H/O hiatal hernia   . Heart murmur   . Herpes zoster    throat  . History of shingles    left throat 07/2007  . Hyperlipidemia   . Hypertension   . Memory changes 01/14/2013  . Neuromuscular disorder ([place])    peripheral neuropathy  . Nocturnal leg cramps 07/15/2014  . Peripheral neuropathy   . Polyneuropathy in other diseases classified elsewhere ([place]) 01/14/2013  . Severe mitral regurgitation   . Stroke (cerebrum) [facility])    Assessment: 87 year old female seen in cardiology office, direct admitted for rule out acs/chest pain. Patient with history of afib on apixban prior to admit. New orders received to transition to IV heparin.   Will need to monitor aptts given recent apixaban use until they correlate with daily heparin levels.    Initial aptt on heparin this morning was within range at 85s. No bleeding issues noted.    Goal of Therapy:  Heparin level 0.3-0.7 units/ml aPTT 66-102 seconds Monitor platelets by anticoagulation protocol: Yes   Plan:  Continue heparin infusion at 800 units/hr Confirmatory aptt later this afternoon Continue to monitor H&H and platelets  Planning cath 4/7  Erin Hearing PharmD., BCPS Clinical Pharmacist 07/06/2020 9:13 AM

## 2020-07-06 NOTE — Progress Notes (Addendum)
Awaiting for IV team to have 2nd IV for Amiodarone. When IV team arrived and was getting IV. Pt brady down into the 30's and converted to NRS with 1st degree Block. Cariology paged to see if pt still needs amiodarone as pt HR is 60-70    NP reports to hold amiodarone for now.

## 2020-07-06 NOTE — Progress Notes (Signed)
  Echocardiogram 2D Echocardiogram has been performed.  Morgan Boyer 07/06/2020, 3:40 PM

## 2020-07-06 NOTE — Plan of Care (Signed)
  Problem: Clinical Measurements: Goal: Cardiovascular complication will be avoided Outcome: Progressing   Problem: Pain Managment: Goal: General experience of comfort will improve Description: General experience of comfort will improve Outcome: Progressing   Problem: Skin Integrity: Goal: Risk for impaired skin integrity will decrease Outcome: Progressing

## 2020-07-06 NOTE — Plan of Care (Signed)
  Problem: Education: Goal: Knowledge of General Education information will improve Description: Including pain rating scale, medication(s)/side effects and non-pharmacologic comfort measures Outcome: Completed/Met   Problem: Activity: Goal: Risk for activity intolerance will decrease Outcome: Completed/Met   Problem: Nutrition: Goal: Adequate nutrition will be maintained Outcome: Completed/Met

## 2020-07-06 NOTE — Progress Notes (Signed)
Progress Note  Patient Name: Morgan Boyer Date of Encounter: 07/06/2020  Primary Cardiologist: Dr. Angelena Form, MD   Subjective   Comfortable now however received several SL NTG doses overnight for recurrent chest pain. Breathing is stable with no SOB.   Inpatient Medications    Scheduled Meds: . aspirin EC  81 mg Oral Daily  . B-complex with vitamin C  1 tablet Oral Daily  . furosemide  40 mg Intravenous Q12H  . gabapentin  300 mg Oral QHS  . lisinopril  20 mg Oral BID  . metoprolol tartrate  12.5 mg Oral BID  . nitroGLYCERIN  0.5 inch Topical Q6H  . pantoprazole  40 mg Oral Daily  . potassium chloride  20 mEq Oral BID  . simvastatin  20 mg Oral QPM   Continuous Infusions: . sodium chloride    . heparin 800 Units/hr (07/05/20 2226)   PRN Meds: acetaminophen, nitroGLYCERIN, ondansetron (ZOFRAN) IV, polyvinyl alcohol   Vital Signs    Vitals:   07/06/20 0240 07/06/20 0300 07/06/20 0421 07/06/20 0700  BP: 113/74 122/75  (!) 146/83  Pulse: 66 63  70  Resp: (!) 21 (!) 22  (!) 24  Temp: 98.5 F (36.9 C) 98.3 F (36.8 C)    TempSrc: Oral Oral    SpO2: 93% 100%  98%  Weight:   65.1 kg     Intake/Output Summary (Last 24 hours) at 07/06/2020 0757 Last data filed at 07/06/2020 0600 Gross per 24 hour  Intake 240 ml  Output 675 ml  Net -435 ml   Filed Weights   07/06/20 0421  Weight: 65.1 kg    Physical Exam   General: Elderly, NAD Skin: Warm, dry, intact  Neck: Negative for carotid bruits. No JVD Lungs:Clear to ausculation bilaterally. Breathing is unlabored. Cardiovascular: RRR with S1 S2. + murmur Abdomen: Soft, non-tender, non-distended. No obvious abdominal masses. Extremities: 1-2+ BLE. Radial pulses 2+ bilaterally Neuro: Alert and oriented to person and place, somewhat to situation. No focal deficits. No facial asymmetry. MAE spontaneously. Psych: Responds to questions appropriately with normal affect.    Labs    Chemistry Recent Labs  Lab  07/05/20 1911 07/06/20 0337  NA 139 140  K 3.4* 3.5  CL 104 103  CO2 26 30  GLUCOSE 119* 150*  BUN 14 12  CREATININE 0.79 0.90  CALCIUM 8.8* 8.7*  PROT 6.6  --   ALBUMIN 3.8  --   AST 29  --   ALT 18  --   ALKPHOS 50  --   BILITOT 2.0*  --   GFRNONAA >60 >60  ANIONGAP 9 7     Hematology Recent Labs  Lab 07/05/20 1911  WBC 10.2  RBC 4.09  HGB 11.8*  HCT 36.3  MCV 88.8  MCH 28.9  MCHC 32.5  RDW 14.3  PLT 156    Cardiac EnzymesNo results for input(s): TROPONINI in the last 168 hours. No results for input(s): TROPIPOC in the last 168 hours.   BNPNo results for input(s): BNP, PROBNP in the last 168 hours.   DDimer No results for input(s): DDIMER in the last 168 hours.   Radiology    Portable chest x-ray 1 view  Result Date: 07/05/2020 CLINICAL DATA:  Chest pain at rest EXAM: PORTABLE CHEST 1 VIEW COMPARISON:  12/01/2015 FINDINGS: Cardiac enlargement. No vascular congestion, edema, or consolidation. Calcification in the mitral valve annulus. Loop recorder. Calcification of the aorta. No pleural effusions. No pneumothorax. Mediastinal contours appear  intact. IMPRESSION: Cardiac enlargement. No evidence of active pulmonary disease. Electronically Signed   By: Lucienne Capers M.D.   On: 07/05/2020 19:27   Telemetry    07/06/20 NSR HR in the 60-70's  - Personally Reviewed  ECG    No new tracing as of 07/06/20- Personally Reviewed  Cardiac Studies   ECHO 07/22/19 1. Left ventricular ejection fraction, by estimation, is 55 to 60%. The  left ventricle has normal function. The left ventricle has no regional  wall motion abnormalities. There is mild concentric left ventricular  hypertrophy. Left ventricular diastolic  parameters are consistent with Grade III diastolic dysfunction  (restrictive). Elevated left ventricular end-diastolic pressure.  2. Right ventricular systolic function is normal. The right ventricular  size is normal. There is severely elevated  pulmonary artery systolic  pressure.  3. Left atrial size was moderately dilated.  4. The mitral valve is abnormal. Moderate to severe mitral valve  regurgitation.  5. Tricuspid valve regurgitation is moderate.  6. The aortic valve is tricuspid. Aortic valve regurgitation is trivial.  Mild aortic valve sclerosis is present, with no evidence of aortic valve  stenosis.  7. The inferior vena cava is normal in size with greater than 50%  respiratory variability, suggesting right atrial pressure of 3 mmHg.   Comparison(s): No significant change from prior study.   Conclusion(s)/Recommendation(s): Severe prolapse of P2 leaflet of mitral  valve, leading to moderate-severe anterior directed MR jet. Appears  similar to prior. Restrictive (grade 3) diastolic dysfunction with  severely elevated pulmonary pressures.   TEE 10/31/18: 1. The left ventricle has normal systolic function, with an ejection  fraction of 60-65%.  2. LA, LAA without masses.  3. NO PFO by color doppler or as tested with injection of agitated  saline.  4. The mitral valve is abnormal.  5. Partially flail posterior leaflet of the mitral valve (P2) with severe  mitral regurgitation directed anteriorly and around LA.  Patient Profile     87 y.o. female with a hx of HTN, HLD, memory disorder, peripheral neuropathy, OA, GERD, depression, CVA, atrial fibrillation on AC and moderate to severe MR who was seen in the office 07/05/20 and sent for direct admit to [facility] for the evaluation of MR and chest pain.   Assessment & Plan    1. Chest pain: -Pt seen in the office 07/05/20 with c/o chest pain with radiation to her back with no prior ischemic workup. Has known hx of progressing MR>>severe on TEE 10/2018>>mod to severe per echocardiogram 07/22/19 -HsT, 19 -Had recurrent chest pain overnight and received SL NTG>>comfortable now  -Scheduled for repeat echo today   -Continue IV heparin, ASA  -Continue metoprolol, lisinopril   -Plan for cath 07/07/20  2. Moderate to severe MR: -Pt seen in the office 07/05/20 with c/o chest pain with radiation to her back with no prior ischemic workup. Has known hx of severe MR on TEE 10/2018>>maintained with Lasix in the OP setting>>presented for follow up with with chest pain and no SOB -Scheduled for repeat echo today   -MR could likely be source of chest pain>>LHC 07/07/20 for further evaluation  -Continue IV Lasix>>mildly volume overloaded on exam   3. Acute on chronic diastolic HF: -Echo from 05/9922 with grade III restrictive diastolic dysfunction  -Continue IV Lasix 40mg  BID -Weight, 143.5lb  -I&O, net negative 435ml   4. PAF: -Loop recorder in place  -Maintaining NSR on telemetry  -TSH/T4, WNL  -Continue IV Heparin for AC, metoprolol  5. Hx of CVA: -Admitted to [facility] 10/2018 found to have LUE weakness>>given tPA for acute CVA with evidence of right MCA and left posterior MCA infarcts>>cardioembolic pattern -TEE 3/49/61 showing EF 60-65%, no masses of LAA, no PFO, partially flail leaflet of the mitral valve with severe mitral regurgitation>>loop recorder implanted by EP  6. HLD: -Last LDL, 53 on 07/06/2020 -Continue statin therapy   7. HTN: -Stable, 146/83>>122/75>>>113/74 -Continue current regimen   8. Memory deficit: -Alert and oriented to person and place -Reiterated plan several times during encounter -No family at bedside    Signed, Kathyrn Drown NP-C [place] Pager: [phone] 07/06/2020, 7:57 AM     For questions or updates, please contact   Please consult [email or web address] for contact info under Cardiology/STEMI.

## 2020-07-06 NOTE — Progress Notes (Signed)
   07/06/20 1000  Assess: MEWS Score  BP (!) 131/99  Pulse Rate (!) 119  ECG Heart Rate (!) 119  Resp (!) 30  SpO2 99 %  Assess: MEWS Score  MEWS Temp 0  MEWS Systolic 0  MEWS Pulse 2  MEWS RR 2  MEWS LOC 0  MEWS Score 4  MEWS Score Color Red  Assess: if the MEWS score is Yellow or Red  Were vital signs taken at a resting state? Yes  Focused Assessment No change from prior assessment  MEWS guidelines implemented *See Row Information* No, vital signs rechecked  Treat  MEWS Interventions Other (Comment)  Pain Scale 0-10  Pain Score 0  Take Vital Signs  Increase Vital Sign Frequency  Yellow: Q 2hr X 2 then Q 4hr X 2, if remains yellow, continue Q 4hrs  Escalate  MEWS: Escalate Yellow: discuss with charge nurse/RN and consider discussing with provider and RRT  Notify: Charge Nurse/RN  Name of Charge Nurse/RN Notified Vinnie Level, RN  Date Charge Nurse/RN Notified 07/06/20  Time Charge Nurse/RN Notified 1010  Document  Patient Outcome Other (Comment) (patient was moving in bed and HR  increased)

## 2020-07-06 NOTE — Progress Notes (Signed)
[place] for heparin Indication: chest pain/ACS  Allergies  Allergen Reactions  . Septra [Sulfamethoxazole-Trimethoprim] Hives  . Lotensin [Benazepril Hcl] Other (See Comments)    NOT KNOWN    Patient Measurements: Weight: 65.1 kg (143 lb 8.3 oz) Heparin Dosing Weight: 64kg  Vital Signs: Temp: 98.1 F (36.7 C) (04/06 1936) Temp Source: Oral (04/06 1936) BP: 167/90 (04/06 2100) Pulse Rate: 76 (04/06 2100)  Labs: Recent Labs    07/05/20 1911 07/06/20 0337 07/06/20 0653 07/06/20 0841 07/06/20 1037 07/06/20 2045  HGB 11.8*  --  11.7*  --   --   --   HCT 36.3  --  35.7*  --   --   --   PLT 156  --  162  --   --   --   APTT 34  --  85*  --   --  97*  LABPROT 20.5*  --   --   --   --   --   INR 1.8*  --   --   --   --   --   HEPARINUNFRC >2.20*  --   --   --   --   --   CREATININE 0.79 0.90  --   --   --   --   TROPONINIHS 19*  --   --  15 16  --     Estimated Creatinine Clearance: 39.7 mL/min (by C-G formula based on SCr of 0.9 mg/dL).   Medical History: Past Medical History:  Diagnosis Date  . Abnormality of gait   . Adenomatous colon polyp   . Arthritis   . Atrial fibrillation ([place])   . Back pain   . Cancer ([place])    skin cancers  . Depression   . Diverticulosis   . DJD (degenerative joint disease) of knee   . Esophageal reflux   . H/O hiatal hernia   . Heart murmur   . Herpes zoster    throat  . History of shingles    left throat 07/2007  . Hyperlipidemia   . Hypertension   . Memory changes 01/14/2013  . Neuromuscular disorder ([place])    peripheral neuropathy  . Nocturnal leg cramps 07/15/2014  . Peripheral neuropathy   . Polyneuropathy in other diseases classified elsewhere ([place]) 01/14/2013  . Severe mitral regurgitation   . Stroke (cerebrum) [facility])    Assessment: 87 year old female seen in cardiology office, direct admitted for rule out acs/chest pain. Patient with history of afib on apixban prior to admit.  New orders received to transition to IV heparin.   Confirmatory aPTT remains therapeutic at 97 seconds.  Goal of Therapy:  Heparin level 0.3-0.7 units/ml aPTT 66-102 seconds Monitor platelets by anticoagulation protocol: Yes   Plan:  Continue heparin infusion at 800 units/hr Heparin level and aPTT in am   Arrie Senate, PharmD, BCPS, [facility] Clinical Pharmacist [phone] Please check AMION for all [place] numbers 07/06/2020

## 2020-07-06 NOTE — Progress Notes (Addendum)
PT having 10/10 chest pain. Gave pt 2 tabs of nitro pain not relieved.  EKG done. Notified provider

## 2020-07-07 ENCOUNTER — Ambulatory Visit (HOSPITAL_COMMUNITY): Admit: 2020-07-07 | Payer: PPO | Admitting: Cardiovascular Disease

## 2020-07-07 ENCOUNTER — Inpatient Hospital Stay (HOSPITAL_COMMUNITY)
Admission: AD | Disposition: A | Discharge status: Skilled Nursing Facility | Payer: Self-pay | Source: Home / Self Care | Attending: Cardiology

## 2020-07-07 DIAGNOSIS — I2510 Atherosclerotic heart disease of native coronary artery without angina pectoris: Secondary | ICD-10-CM

## 2020-07-07 DIAGNOSIS — I4891 Unspecified atrial fibrillation: Secondary | ICD-10-CM | POA: Diagnosis not present

## 2020-07-07 DIAGNOSIS — I200 Unstable angina: Secondary | ICD-10-CM | POA: Diagnosis not present

## 2020-07-07 DIAGNOSIS — I340 Nonrheumatic mitral (valve) insufficiency: Secondary | ICD-10-CM | POA: Diagnosis not present

## 2020-07-07 HISTORY — PX: RIGHT/LEFT HEART CATH AND CORONARY ANGIOGRAPHY: CATH118266

## 2020-07-07 LAB — POCT I-STAT EG7
Acid-Base Excess: 5 mmol/L — ABNORMAL HIGH (ref 0.0–2.0)
Acid-Base Excess: 5 mmol/L — ABNORMAL HIGH (ref 0.0–2.0)
Bicarbonate: 30 mmol/L — ABNORMAL HIGH (ref 20.0–28.0)
Bicarbonate: 30.2 mmol/L — ABNORMAL HIGH (ref 20.0–28.0)
Calcium, Ion: 1.15 mmol/L (ref 1.15–1.40)
Calcium, Ion: 1.17 mmol/L (ref 1.15–1.40)
HCT: 36 % (ref 36.0–46.0)
HCT: 36 % (ref 36.0–46.0)
Hemoglobin: 12.2 g/dL (ref 12.0–15.0)
Hemoglobin: 12.2 g/dL (ref 12.0–15.0)
O2 Saturation: 52 %
O2 Saturation: 54 %
Potassium: 3.9 mmol/L (ref 3.5–5.1)
Potassium: 3.9 mmol/L (ref 3.5–5.1)
Sodium: 139 mmol/L (ref 135–145)
Sodium: 140 mmol/L (ref 135–145)
TCO2: 31 mmol/L (ref 22–32)
TCO2: 32 mmol/L (ref 22–32)
pCO2, Ven: 46.7 mmHg (ref 44.0–60.0)
pCO2, Ven: 46.9 mmHg (ref 44.0–60.0)
pH, Ven: 7.416 (ref 7.250–7.430)
pH, Ven: 7.417 (ref 7.250–7.430)
pO2, Ven: 28 mmHg — CL (ref 32.0–45.0)
pO2, Ven: 28 mmHg — CL (ref 32.0–45.0)

## 2020-07-07 LAB — POCT I-STAT 7, (LYTES, BLD GAS, ICA,H+H)
Acid-Base Excess: 5 mmol/L — ABNORMAL HIGH (ref 0.0–2.0)
Bicarbonate: 28.7 mmol/L — ABNORMAL HIGH (ref 20.0–28.0)
Calcium, Ion: 1.16 mmol/L (ref 1.15–1.40)
HCT: 34 % — ABNORMAL LOW (ref 36.0–46.0)
Hemoglobin: 11.6 g/dL — ABNORMAL LOW (ref 12.0–15.0)
O2 Saturation: 99 %
Potassium: 3.9 mmol/L (ref 3.5–5.1)
Sodium: 139 mmol/L (ref 135–145)
TCO2: 30 mmol/L (ref 22–32)
pCO2 arterial: 39.6 mmHg (ref 32.0–48.0)
pH, Arterial: 7.468 — ABNORMAL HIGH (ref 7.350–7.450)
pO2, Arterial: 122 mmHg — ABNORMAL HIGH (ref 83.0–108.0)

## 2020-07-07 LAB — HEPARIN LEVEL (UNFRACTIONATED): Heparin Unfractionated: 1.58 IU/mL — ABNORMAL HIGH (ref 0.30–0.70)

## 2020-07-07 LAB — CBC
HCT: 37.2 % (ref 36.0–46.0)
Hemoglobin: 12.2 g/dL (ref 12.0–15.0)
MCH: 28.9 pg (ref 26.0–34.0)
MCHC: 32.8 g/dL (ref 30.0–36.0)
MCV: 88.2 fL (ref 80.0–100.0)
Platelets: 173 10*3/uL (ref 150–400)
RBC: 4.22 MIL/uL (ref 3.87–5.11)
RDW: 14.3 % (ref 11.5–15.5)
WBC: 14.7 10*3/uL — ABNORMAL HIGH (ref 4.0–10.5)
nRBC: 0 % (ref 0.0–0.2)

## 2020-07-07 LAB — BASIC METABOLIC PANEL
Anion gap: 9 (ref 5–15)
BUN: 17 mg/dL (ref 8–23)
CO2: 26 mmol/L (ref 22–32)
Calcium: 8.8 mg/dL — ABNORMAL LOW (ref 8.9–10.3)
Chloride: 102 mmol/L (ref 98–111)
Creatinine, Ser: 0.77 mg/dL (ref 0.44–1.00)
GFR, Estimated: >60 mL/min (ref >60)
Glucose, Bld: 150 mg/dL — ABNORMAL HIGH (ref 70–99)
Potassium: 3.4 mmol/L — ABNORMAL LOW (ref 3.5–5.1)
Sodium: 137 mmol/L (ref 135–145)

## 2020-07-07 LAB — APTT: aPTT: 75 seconds — ABNORMAL HIGH (ref 24–36)

## 2020-07-07 SURGERY — RIGHT/LEFT HEART CATH AND CORONARY ANGIOGRAPHY
Anesthesia: LOCAL

## 2020-07-07 MED ORDER — HEPARIN (PORCINE) IN NACL 1000-0.9 UT/500ML-% IV SOLN
INTRAVENOUS | Status: AC
  Filled 2020-07-07: qty 1000

## 2020-07-07 MED ORDER — MUSCLE RUB 10-15 % EX CREA
1.0000 "application " | TOPICAL_CREAM | Freq: Four times a day (QID) | CUTANEOUS | Status: DC | PRN
  Administered 2020-07-07 – 2020-07-09 (×2): 1 via TOPICAL
  Filled 2020-07-07: qty 85

## 2020-07-07 MED ORDER — MORPHINE SULFATE (PF) 4 MG/ML IV SOLN
INTRAVENOUS | Status: AC
  Filled 2020-07-07: qty 1

## 2020-07-07 MED ORDER — IOHEXOL 350 MG/ML SOLN
INTRAVENOUS | Status: DC | PRN
  Administered 2020-07-07: 30 mL

## 2020-07-07 MED ORDER — SODIUM CHLORIDE 0.9 % IV SOLN: INTRAVENOUS | Status: AC

## 2020-07-07 MED ORDER — MIDAZOLAM HCL 2 MG/2ML IJ SOLN
INTRAMUSCULAR | Status: AC
  Filled 2020-07-07: qty 2

## 2020-07-07 MED ORDER — ENOXAPARIN SODIUM 40 MG/0.4ML ~~LOC~~ SOLN
40.0000 mg | SUBCUTANEOUS | Status: DC
  Administered 2020-07-08: 40 mg via SUBCUTANEOUS
  Filled 2020-07-07: qty 0.4

## 2020-07-07 MED ORDER — ACETAMINOPHEN 325 MG PO TABS: 650.0000 mg | ORAL_TABLET | ORAL | Status: DC | PRN

## 2020-07-07 MED ORDER — AMIODARONE HCL IN DEXTROSE 360-4.14 MG/200ML-% IV SOLN
60.0000 mg/h | INTRAVENOUS | Status: AC
  Administered 2020-07-07 (×2): 60 mg/h via INTRAVENOUS
  Filled 2020-07-07: qty 200

## 2020-07-07 MED ORDER — AMIODARONE HCL IN DEXTROSE 360-4.14 MG/200ML-% IV SOLN
30.0000 mg/h | INTRAVENOUS | Status: DC
  Administered 2020-07-07 – 2020-07-08 (×4): 30 mg/h via INTRAVENOUS
  Filled 2020-07-07 (×3): qty 200

## 2020-07-07 MED ORDER — LABETALOL HCL 5 MG/ML IV SOLN: 10.0000 mg | INTRAVENOUS | Status: AC | PRN

## 2020-07-07 MED ORDER — ONDANSETRON HCL 4 MG/2ML IJ SOLN: 4.0000 mg | Freq: Four times a day (QID) | INTRAMUSCULAR | Status: DC | PRN

## 2020-07-07 MED ORDER — SODIUM CHLORIDE 0.9 % IV SOLN: 250.0000 mL | INTRAVENOUS | Status: DC | PRN

## 2020-07-07 MED ORDER — HEPARIN (PORCINE) IN NACL 1000-0.9 UT/500ML-% IV SOLN
INTRAVENOUS | Status: DC | PRN
  Administered 2020-07-07 (×2): 500 mL

## 2020-07-07 MED ORDER — POTASSIUM CHLORIDE CRYS ER 20 MEQ PO TBCR
40.0000 meq | EXTENDED_RELEASE_TABLET | Freq: Two times a day (BID) | ORAL | Status: DC
  Administered 2020-07-07 – 2020-07-08 (×4): 40 meq via ORAL
  Filled 2020-07-07 (×5): qty 2

## 2020-07-07 MED ORDER — SODIUM CHLORIDE 0.9 % IV SOLN
INTRAVENOUS | Status: DC
Start: 2020-07-07 — End: 2020-07-08

## 2020-07-07 MED ORDER — HYDRALAZINE HCL 20 MG/ML IJ SOLN: 10.0000 mg | INTRAMUSCULAR | Status: AC | PRN

## 2020-07-07 MED ORDER — LIDOCAINE HCL (PF) 1 % IJ SOLN
INTRAMUSCULAR | Status: AC
  Filled 2020-07-07: qty 30

## 2020-07-07 MED ORDER — HEPARIN SODIUM (PORCINE) 1000 UNIT/ML IJ SOLN
INTRAMUSCULAR | Status: AC
  Filled 2020-07-07: qty 1

## 2020-07-07 MED ORDER — AMIODARONE LOAD VIA INFUSION
150.0000 mg | Freq: Once | INTRAVENOUS | Status: AC
  Administered 2020-07-07: 150 mg via INTRAVENOUS
  Filled 2020-07-07: qty 83.34

## 2020-07-07 MED ORDER — ASPIRIN 81 MG PO CHEW
81.0000 mg | CHEWABLE_TABLET | Freq: Every day | ORAL | Status: DC
  Filled 2020-07-07: qty 1

## 2020-07-07 MED ORDER — VERAPAMIL HCL 2.5 MG/ML IV SOLN
INTRAVENOUS | Status: AC
  Filled 2020-07-07: qty 2

## 2020-07-07 MED ORDER — SODIUM CHLORIDE 0.9% FLUSH: 3.0000 mL | INTRAVENOUS | Status: DC | PRN

## 2020-07-07 MED ORDER — MIDAZOLAM HCL 2 MG/2ML IJ SOLN
INTRAMUSCULAR | Status: DC | PRN
  Administered 2020-07-07: 1 mg via INTRAVENOUS

## 2020-07-07 MED ORDER — SPIRONOLACTONE 12.5 MG HALF TABLET
12.5000 mg | ORAL_TABLET | Freq: Every day | ORAL | Status: DC
  Administered 2020-07-07 – 2020-07-08 (×2): 12.5 mg via ORAL
  Filled 2020-07-07 (×2): qty 1

## 2020-07-07 MED ORDER — MORPHINE SULFATE (PF) 2 MG/ML IV SOLN
INTRAVENOUS | Status: DC | PRN
  Administered 2020-07-07: 2 mg via INTRAVENOUS

## 2020-07-07 MED ORDER — SODIUM CHLORIDE 0.9% FLUSH
3.0000 mL | Freq: Two times a day (BID) | INTRAVENOUS | Status: DC
  Administered 2020-07-07 – 2020-07-15 (×15): 3 mL via INTRAVENOUS

## 2020-07-07 MED ORDER — LIDOCAINE HCL (PF) 1 % IJ SOLN
INTRAMUSCULAR | Status: DC | PRN
  Administered 2020-07-07 (×2): 2 mL
  Administered 2020-07-07: 15 mL

## 2020-07-07 SURGICAL SUPPLY — 16 items
CATH INFINITI 5FR AL1 (CATHETERS) ×1 IMPLANT
CATH INFINITI 5FR MULTPACK ANG (CATHETERS) ×1 IMPLANT
CATH SWAN GANZ 7F STRAIGHT (CATHETERS) ×1 IMPLANT
DEVICE RAD COMP TR BAND LRG (VASCULAR PRODUCTS) ×1 IMPLANT
GLIDESHEATH SLEND SS 6F .021 (SHEATH) ×1 IMPLANT
GLIDESHEATH SLENDER 7FR .021G (SHEATH) ×1 IMPLANT
GUIDEWIRE INQWIRE 1.5J.035X260 (WIRE) IMPLANT
INQWIRE 1.5J .035X260CM (WIRE) ×2
KIT HEART LEFT (KITS) ×2 IMPLANT
PACK CARDIAC CATHETERIZATION (CUSTOM PROCEDURE TRAY) ×2 IMPLANT
SHEATH PINNACLE 5F 10CM (SHEATH) ×1 IMPLANT
TRANSDUCER W/STOPCOCK (MISCELLANEOUS) ×2 IMPLANT
TUBING CIL FLEX 10 FLL-RA (TUBING) ×2 IMPLANT
WIRE EMERALD 3MM-J .035X150CM (WIRE) ×1 IMPLANT
WIRE EMERALD ST .035X150CM (WIRE) ×1 IMPLANT
WIRE HI TORQ VERSACORE-J 145CM (WIRE) ×1 IMPLANT

## 2020-07-07 NOTE — Progress Notes (Addendum)
This RN notified by central tele that pt flipped into a.fib at approx 0039. HR in the 110s-120s. [facility] Dr. Paticia Stack paged. New orders for Amiodarone bolus and drip, and discontinue PO Amiodarone. EKG done and in chart. Patient resting comfortably in bed, call bell within reach.  0200 patient converted back in NSR.

## 2020-07-07 NOTE — Plan of Care (Signed)
  Problem: Clinical Measurements: Goal: Ability to maintain clinical measurements within normal limits will improve Outcome: Progressing   Problem: Clinical Measurements: Goal: Diagnostic test results will improve Outcome: Progressing   Problem: Clinical Measurements: Goal: Cardiovascular complication will be avoided Outcome: Progressing   Problem: Pain Managment: Goal: General experience of comfort will improve Description: General experience of comfort will improve Outcome: Progressing   Problem: Skin Integrity: Goal: Risk for impaired skin integrity will decrease Outcome: Progressing

## 2020-07-07 NOTE — Progress Notes (Signed)
Another RN offered to remove 2 ccs of air from pt's TR band and that nurse approached this RN and stated that pt was bleeding from site and that there was a hematoma. This RN walked into pt's room and noted that pt was bleeding from site and had hematoma, and other RN was applying pressure manually and removed TR band while applying firm pressure. This RN called cath lab and an RN from cath lab came immediately to bedside where she reapplied TR band. Hematoma was resolved and there was no more bleeding. Pt comfortable. This RN continued to monitor.

## 2020-07-07 NOTE — Anesthesia Preprocedure Evaluation (Addendum)
Anesthesia Evaluation  Patient identified by MRN, date of birth, ID band Patient awake    Reviewed: Allergy & Precautions, NPO status , Patient's Chart, lab work & pertinent test results  Airway Mallampati: II  TM Distance: >3 FB Neck ROM: Full    Dental no notable dental hx. (+) Teeth Intact, Dental Advisory Given   Pulmonary neg pulmonary ROS,    Pulmonary exam normal breath sounds clear to auscultation       Cardiovascular Exercise Tolerance: Good hypertension, Pt. on medications + angina Normal cardiovascular exam+ Valvular Problems/Murmurs MR and AS  Rhythm:Irregular Rate:Abnormal  07/06/2020 echo 1. Left ventricular ejection fraction, by estimation, is 60 to 65%. The  left ventricle has normal function. The left ventricle has no regional  wall motion abnormalities. There is moderate left ventricular hypertrophy.  Left ventricular diastolic function  could not be evaluated.  2. The pericardial effusion is posterior to the left ventricle.  3. The mitral annulus is calcified and the posterior leafelt appears  restricted at the base and the tip of the leaflet prolapses. There is  severe eccentric anterior and medially directed mitral regurgitation. The  mitral valve is degenerative. Severe  mitral valve regurgitation.  4. Tricuspid valve regurgitation is mild to moderate.  5. The aortic valve is tricuspid. Aortic valve regurgitation is not  visualized. Moderate aortic valve stenosis. Aortic valve area, by VTI    Neuro/Psych PSYCHIATRIC DISORDERS  Neuromuscular disease CVA    GI/Hepatic Neg liver ROS, hiatal hernia, GERD  ,  Endo/Other  negative endocrine ROS  Renal/GU negative Renal ROSLab Results      Component                Value               Date                      CREATININE               0.77                07/07/2020                BUN                      17                  07/07/2020                NA                        139                 07/07/2020                K                        3.9                 07/07/2020                CL                       102                 07/07/2020  CO2                      26                  07/07/2020                Musculoskeletal  (+) Arthritis ,   Abdominal   Peds  Hematology  (+) anemia , Lab Results      Component                Value               Date                      WBC                      14.7 (H)            07/07/2020                HGB                      11.6 (L)            07/07/2020                HCT                      34.0 (L)            07/07/2020                MCV                      88.2                07/07/2020                PLT                      173                 07/07/2020              Anesthesia Other Findings   Reproductive/Obstetrics                            Anesthesia Physical Anesthesia Plan  ASA: III  Anesthesia Plan: MAC   Post-op Pain Management:    Induction: Intravenous  PONV Risk Score and Plan: 2 and Treatment may vary due to age or medical condition  Airway Management Planned: Natural Airway  Additional Equipment: None  Intra-op Plan:   Post-operative Plan:   Informed Consent: I have reviewed the patients History and Physical, chart, labs and discussed the procedure including the risks, benefits and alternatives for the proposed anesthesia with the patient or authorized representative who has indicated his/her understanding and acceptance.     Dental advisory given  Plan Discussed with: CRNA  Anesthesia Plan Comments:        Anesthesia Quick Evaluation

## 2020-07-07 NOTE — Plan of Care (Signed)
  Problem: Health Behavior/Discharge Planning: Goal: Ability to manage health-related needs will improve Outcome: Progressing   Problem: Clinical Measurements: Goal: Ability to maintain clinical measurements within normal limits will improve Outcome: Progressing Goal: Will remain free from infection Outcome: Progressing Goal: Diagnostic test results will improve Outcome: Progressing Goal: Respiratory complications will improve Outcome: Progressing Goal: Cardiovascular complication will be avoided Outcome: Progressing   Problem: Coping: Goal: Level of anxiety will decrease Outcome: Progressing   Problem: Elimination: Goal: Will not experience complications related to bowel motility Outcome: Progressing Goal: Will not experience complications related to urinary retention Outcome: Progressing   Problem: Pain Managment: Goal: General experience of comfort will improve Description: General experience of comfort will improve Outcome: Progressing   Problem: Safety: Goal: Ability to remain free from injury will improve Outcome: Progressing   Problem: Skin Integrity: Goal: Risk for impaired skin integrity will decrease Outcome: Progressing

## 2020-07-07 NOTE — Progress Notes (Signed)
Pt had abnormal EKG. Dr. Brien Mates notified face-to-face and no other actions required at this time . RN will continue to monitor.

## 2020-07-07 NOTE — Progress Notes (Signed)
[place] for heparin Indication: chest pain/ACS  Allergies  Allergen Reactions  . Septra [Sulfamethoxazole-Trimethoprim] Hives  . Lotensin [Benazepril Hcl] Other (See Comments)    NOT KNOWN    Patient Measurements: Weight: 61.6 kg (135 lb 12.9 oz) Heparin Dosing Weight: 64kg  Vital Signs: Temp: 98.2 F (36.8 C) (04/07 0344) Temp Source: Oral (04/07 0344) BP: 121/77 (04/07 0344) Pulse Rate: 63 (04/07 0344)  Labs: Recent Labs    07/05/20 1911 07/06/20 0337 07/06/20 0653 07/06/20 0841 07/06/20 1037 07/06/20 2045 07/07/20 0054  HGB 11.8*  --  11.7*  --   --   --  12.2  HCT 36.3  --  35.7*  --   --   --  37.2  PLT 156  --  162  --   --   --  173  APTT 34  --  85*  --   --  97* 75*  LABPROT 20.5*  --   --   --   --   --   --   INR 1.8*  --   --   --   --   --   --   HEPARINUNFRC >2.20*  --   --   --   --   --  1.58*  CREATININE 0.79 0.90  --   --   --   --   --   TROPONINIHS 19*  --   --  15 16  --   --     Estimated Creatinine Clearance: 38.7 mL/min (by C-G formula based on SCr of 0.9 mg/dL).   Medical History: Past Medical History:  Diagnosis Date  . Abnormality of gait   . Adenomatous colon polyp   . Arthritis   . Atrial fibrillation ([place])   . Back pain   . Cancer ([place])    skin cancers  . Depression   . Diverticulosis   . DJD (degenerative joint disease) of knee   . Esophageal reflux   . H/O hiatal hernia   . Heart murmur   . Herpes zoster    throat  . History of shingles    left throat 07/2007  . Hyperlipidemia   . Hypertension   . Memory changes 01/14/2013  . Neuromuscular disorder ([place])    peripheral neuropathy  . Nocturnal leg cramps 07/15/2014  . Peripheral neuropathy   . Polyneuropathy in other diseases classified elsewhere ([place]) 01/14/2013  . Severe mitral regurgitation   . Stroke (cerebrum) [facility])    Assessment: 87 year old female seen in cardiology office, direct admitted for rule out acs/chest  pain. Patient with history of afib on apixban prior to admit. New orders received to transition to IV heparin.   aPTT remains therapeutic at 75 seconds. No bleeding issues noted. Patient in/out of afib overnight.   Goal of Therapy:  Heparin level 0.3-0.7 units/ml aPTT 66-102 seconds Monitor platelets by anticoagulation protocol: Yes   Plan:  Continue heparin infusion at 800 units/hr Heparin level and aPTT in am Plan for cath today  Erin Hearing PharmD., BCPS Clinical Pharmacist 07/07/2020 7:43 AM

## 2020-07-07 NOTE — Progress Notes (Signed)
Cardiology Progress Note  Patient ID: Morgan Boyer MRN: [id number] DOB: 31-Dec-1931 Date of Encounter: 07/07/2020  Primary Cardiologist: Lauree Chandler, MD  Subjective   Chief Complaint: Chest pain  HPI: Chest pain this morning.  EKG unchanged.  A. fib overnight.  Back on amiodarone drip.  Back in sinus.  N.p.o. for left and right heart catheterization today.  ROS:  All other ROS reviewed and negative. Pertinent positives noted in the HPI.     Inpatient Medications  Scheduled Meds: . aspirin EC  81 mg Oral Daily  . B-complex with vitamin C  1 tablet Oral Daily  . furosemide  40 mg Intravenous BID  . gabapentin  300 mg Oral QHS  . lisinopril  20 mg Oral BID  . nitroGLYCERIN  0.5 inch Topical Q6H  . pantoprazole  40 mg Oral Daily  . potassium chloride  40 mEq Oral Daily  . simvastatin  20 mg Oral QPM  . sodium chloride flush  3 mL Intravenous Q12H   Continuous Infusions: . sodium chloride    . sodium chloride    . sodium chloride 10 mL/hr at 07/07/20 0511  . amiodarone 30 mg/hr (07/07/20 0757)  . heparin 800 Units/hr (07/07/20 0549)   PRN Meds: sodium chloride, acetaminophen, Muscle Rub, nitroGLYCERIN, ondansetron (ZOFRAN) IV, polyvinyl alcohol, sodium chloride flush   Vital Signs   Vitals:   07/06/20 2300 07/07/20 0036 07/07/20 0344 07/07/20 0748  BP: (!) 144/82  121/77 123/81  Pulse: 68  63 (!) 59  Resp: 18  20 (!) 21  Temp: 98.5 F (36.9 C)  98.2 F (36.8 C) 98.5 F (36.9 C)  TempSrc: Oral  Oral Oral  SpO2: 99%  97% 99%  Weight:  61.6 kg 61.6 kg     Intake/Output Summary (Last 24 hours) at 07/07/2020 0859 Last data filed at 07/07/2020 0344 Gross per 24 hour  Intake 434.43 ml  Output 1300 ml  Net -865.57 ml   Last 3 Weights 07/07/2020 07/07/2020 07/06/2020  Weight (lbs) 135 lb 12.9 oz 135 lb 12.9 oz 143 lb 8.3 oz  Weight (kg) 61.6 kg 61.6 kg 65.1 kg      Telemetry  Overnight telemetry shows sinus rhythm in the 70s, which I personally reviewed.   ECG   The most recent ECG shows sinus rhythm with anterior Q waves, which I personally reviewed.   Physical Exam   Vitals:   07/06/20 2300 07/07/20 0036 07/07/20 0344 07/07/20 0748  BP: (!) 144/82  121/77 123/81  Pulse: 68  63 (!) 59  Resp: 18  20 (!) 21  Temp: 98.5 F (36.9 C)  98.2 F (36.8 C) 98.5 F (36.9 C)  TempSrc: Oral  Oral Oral  SpO2: 99%  97% 99%  Weight:  61.6 kg 61.6 kg      Intake/Output Summary (Last 24 hours) at 07/07/2020 0859 Last data filed at 07/07/2020 0344 Gross per 24 hour  Intake 434.43 ml  Output 1300 ml  Net -865.57 ml    Last 3 Weights 07/07/2020 07/07/2020 07/06/2020  Weight (lbs) 135 lb 12.9 oz 135 lb 12.9 oz 143 lb 8.3 oz  Weight (kg) 61.6 kg 61.6 kg 65.1 kg    Body mass index is 24.84 kg/m.   General: Well nourished, well developed, in no acute distress Head: Atraumatic, normal size  Eyes: PEERLA, EOMI  Neck: Supple, JVD 8 to 10 cm of water Endocrine: No thryomegaly Cardiac: Normal S1, S2; RRR; harsh 3 out of 6 holosystolic murmur Lungs: Diminished  at the lung bases Abd: Soft, nontender, no hepatomegaly  Ext: Trace edema Musculoskeletal: No deformities, BUE and BLE strength normal and equal Skin: Warm and dry, no rashes   Neuro: Alert and oriented to person, place, time, and situation, CNII-XII grossly intact, no focal deficits  Psych: Normal mood and affect   Labs  High Sensitivity Troponin:   Recent Labs  Lab 07/05/20 1911 07/06/20 0841 07/06/20 1037  TROPONINIHS 19* 15 16     Cardiac EnzymesNo results for input(s): TROPONINI in the last 168 hours. No results for input(s): TROPIPOC in the last 168 hours.  Chemistry Recent Labs  Lab 07/05/20 1911 07/06/20 0337 07/07/20 0652  NA 139 140 137  K 3.4* 3.5 3.4*  CL 104 103 102  CO2 26 30 26   GLUCOSE 119* 150* 150*  BUN 14 12 17   CREATININE 0.79 0.90 0.77  CALCIUM 8.8* 8.7* 8.8*  PROT 6.6  --   --   ALBUMIN 3.8  --   --   AST 29  --   --   ALT 18  --   --   ALKPHOS 50  --   --    BILITOT 2.0*  --   --   GFRNONAA >60 >60 >60  ANIONGAP 9 7 9     Hematology Recent Labs  Lab 07/05/20 1911 07/06/20 0653 07/07/20 0054  WBC 10.2 11.9* 14.7*  RBC 4.09 3.99 4.22  HGB 11.8* 11.7* 12.2  HCT 36.3 35.7* 37.2  MCV 88.8 89.5 88.2  MCH 28.9 29.3 28.9  MCHC 32.5 32.8 32.8  RDW 14.3 14.2 14.3  PLT 156 162 173   BNP Recent Labs  Lab 07/06/20 0653  BNP 611.9*    DDimer No results for input(s): DDIMER in the last 168 hours.   Radiology  Portable chest x-ray 1 view  Result Date: 07/05/2020 CLINICAL DATA:  Chest pain at rest EXAM: PORTABLE CHEST 1 VIEW COMPARISON:  12/01/2015 FINDINGS: Cardiac enlargement. No vascular congestion, edema, or consolidation. Calcification in the mitral valve annulus. Loop recorder. Calcification of the aorta. No pleural effusions. No pneumothorax. Mediastinal contours appear intact. IMPRESSION: Cardiac enlargement. No evidence of active pulmonary disease. Electronically Signed   By: Lucienne Capers M.D.   On: 07/05/2020 19:27   ECHOCARDIOGRAM COMPLETE  Result Date: 07/06/2020    ECHOCARDIOGRAM REPORT   Patient Name:   Morgan Boyer Date of Exam: 07/06/2020 Medical Rec #:  [id number]       Height:       62.0 in Accession #:    [id number]      Weight:       143.5 lb Date of Birth:  09-Feb-1932        BSA:          1.660 m Patient Age:    87 years        BP:           110/61 mmHg Patient Gender: F               HR:           68 bpm. Exam Location:  Inpatient Procedure: 2D Echo, Cardiac Doppler and Color Doppler Indications:    Mitral valve disorder  History:        Patient has prior history of Echocardiogram examinations, most                 recent 07/22/2019. Mitral Valve Disease, Arrythmias:Atrial  Fibrillation; Risk Factors:Hypertension and Dyslipidemia.  Sonographer:    Clayton Lefort RDCS (AE) Referring Phys: [place]  1. Left ventricular ejection fraction, by estimation, is 60 to 65%. The left ventricle has normal  function. The left ventricle has no regional wall motion abnormalities. There is moderate left ventricular hypertrophy. Left ventricular diastolic function  could not be evaluated.  2. The pericardial effusion is posterior to the left ventricle.  3. The mitral annulus is calcified and the posterior leafelt appears restricted at the base and the tip of the leaflet prolapses. There is severe eccentric anterior and medially directed mitral regurgitation. The mitral valve is degenerative. Severe mitral valve regurgitation.  4. Tricuspid valve regurgitation is mild to moderate.  5. The aortic valve is tricuspid. Aortic valve regurgitation is not visualized. Moderate aortic valve stenosis. Aortic valve area, by VTI measures 1.22 cm. AVA by planimetry was 1.31 cm2.  6. Left atrial size was severely dilated.  7. Right ventricular systolic function is normal. The right ventricular size is normal. There is severely elevated pulmonary artery systolic pressure.  8. The inferior vena cava is normal in size with <50% respiratory variability, suggesting right atrial pressure of 8 mmHg. Comparison(s): Changes from prior study are noted. 07/22/2019: LVEF 55-60%, moderate to severe MR. FINDINGS  Left Ventricle: Left ventricular ejection fraction, by estimation, is 60 to 65%. The left ventricle has normal function. The left ventricle has no regional wall motion abnormalities. The left ventricular internal cavity size was normal in size. There is  moderate left ventricular hypertrophy. Left ventricular diastolic function could not be evaluated due to mitral annular calcification (moderate or greater). Left ventricular diastolic function could not be evaluated. Right Ventricle: The right ventricular size is normal. No increase in right ventricular wall thickness. Right ventricular systolic function is normal. There is severely elevated pulmonary artery systolic pressure. The tricuspid regurgitant velocity is 3.87 m/s, and with an  assumed right atrial pressure of 8 mmHg, the estimated right ventricular systolic pressure is 27.2 mmHg. Left Atrium: Left atrial size was severely dilated. Right Atrium: Right atrial size was normal in size. Pericardium: Trivial pericardial effusion is present. The pericardial effusion is posterior to the left ventricle. Mitral Valve: The mitral annulus is calcified and the posterior leafelt appears restricted at the base and the tip of the leaflet prolapses. There is severe eccentric anterior and medially directed mitral regurgitation. The mitral valve is degenerative in appearance. Mild to moderate mitral annular calcification. Severe mitral valve regurgitation, with anteriorly-directed jet. MV peak gradient, 13.2 mmHg. The mean mitral valve gradient is 2.0 mmHg. Tricuspid Valve: The tricuspid valve is not well visualized. Tricuspid valve regurgitation is mild to moderate. Aortic Valve: The aortic valve is tricuspid. Aortic valve regurgitation is not visualized. Moderate aortic stenosis is present. Aortic valve mean gradient measures 8.0 mmHg. Aortic valve peak gradient measures 14.6 mmHg. Aortic valve area, by VTI measures 1.22 cm. Pulmonic Valve: The pulmonic valve was normal in structure. Pulmonic valve regurgitation is not visualized. Aorta: The aortic root and ascending aorta are structurally normal, with no evidence of dilitation. Venous: The inferior vena cava is normal in size with less than 50% respiratory variability, suggesting right atrial pressure of 8 mmHg. IAS/Shunts: No atrial level shunt detected by color flow Doppler.  LEFT VENTRICLE PLAX 2D LVIDd:         4.70 cm LVIDs:         2.80 cm LV PW:  1.50 cm LV IVS:        1.40 cm LVOT diam:     1.90 cm LV SV:         39 LV SV Index:   23 LVOT Area:     2.84 cm  RIGHT VENTRICLE            IVC RV Basal diam:  3.40 cm    IVC diam: 1.80 cm RV S prime:     7.01 cm/s TAPSE (M-mode): 1.7 cm LEFT ATRIUM           Index       RIGHT ATRIUM            Index LA diam:      4.00 cm 2.41 cm/m  RA Area:     12.80 cm LA Vol (A4C): 99.6 ml 59.99 ml/m RA Volume:   25.30 ml  15.24 ml/m  AORTIC VALVE AV Area (Vmax):    1.21 cm AV Area (Vmean):   1.16 cm AV Area (VTI):     1.22 cm AV Vmax:           191.00 cm/s AV Vmean:          130.000 cm/s AV VTI:            0.315 m AV Peak Grad:      14.6 mmHg AV Mean Grad:      8.0 mmHg LVOT Vmax:         81.60 cm/s LVOT Vmean:        53.200 cm/s LVOT VTI:          0.136 m LVOT/AV VTI ratio: 0.43  AORTA Ao Root diam: 3.30 cm Ao Asc diam:  3.40 cm MITRAL VALVE                 TRICUSPID VALVE MV Area (PHT): 2.97 cm      TR Peak grad:   59.9 mmHg MV Peak grad:  13.2 mmHg     TR Vmax:        387.00 cm/s MV Mean grad:  2.0 mmHg MV Vmax:       1.82 m/s      SHUNTS MV Vmean:      67.7 cm/s     Systemic VTI:  0.14 m MR Peak grad:    92.9 mmHg   Systemic Diam: 1.90 cm MR Mean grad:    56.0 mmHg MR Vmax:         482.00 cm/s MR Vmean:        349.0 cm/s MR PISA:         6.28 cm MR PISA Eff ROA: 52 mm MR PISA Radius:  1.00 cm Lyman Bishop MD Electronically signed by Lyman Bishop MD Signature Date/Time: 07/06/2020/4:20:38 PM    Final     Cardiac Studies  TTE 07/06/2020 1. Left ventricular ejection fraction, by estimation, is 60 to 65%. The  left ventricle has normal function. The left ventricle has no regional  wall motion abnormalities. There is moderate left ventricular hypertrophy.  Left ventricular diastolic function  could not be evaluated.  2. The pericardial effusion is posterior to the left ventricle.  3. The mitral annulus is calcified and the posterior leafelt appears  restricted at the base and the tip of the leaflet prolapses. There is  severe eccentric anterior and medially directed mitral regurgitation. The  mitral valve is degenerative. Severe  mitral valve regurgitation.  4. Tricuspid valve regurgitation is mild to  moderate.  5. The aortic valve is tricuspid. Aortic valve regurgitation is not   visualized. Moderate aortic valve stenosis. Aortic valve area, by VTI  measures 1.22 cm. AVA by planimetry was 1.31 cm2.  6. Left atrial size was severely dilated.  7. Right ventricular systolic function is normal. The right ventricular  size is normal. There is severely elevated pulmonary artery systolic  pressure.  8. The inferior vena cava is normal in size with <50% respiratory  variability, suggesting right atrial pressure of 8 mmHg.   Patient Profile  87 year old female with hypertension, hyperlipidemia, memory disorder, paroxysmal atrial fibrillation on anticoagulation, severe mitral vegetation who was admitted on 07/05/2020 with symptoms concerning for unstable angina.   Assessment & Plan   1.  Unstable angina/chest pain -On and off chest pain for 1 week.  Troponins are negative.  BNP elevated.  Severe MR. -I think her major issue is her mitral regurgitation pulmonary hypertension. -She is on aspirin heparin drip.  We will plan for left heart catheterization today. -EKG today unchanged.  No ST elevation on my review. -Continue statin therapy.  LDL at goal. -Holding beta-blocker due to bradycardia while on amiodarone.  2.  A. fib with RVR -Has had several paroxysms of atrial fibrillation. -We will continue amiodarone drip for now.  She was put on this overnight. -We will transition to oral amnio tomorrow. -She may need to remain on IV amiodarone until her TEE is completed.  Would like to get good images to quantify her MR as well as determine if she is a candidate for MitraClip. -On heparin drip for now.  Transition back to Eliquis when able. -Holding metoprolol in the setting of amiodarone use.  She is had some bradycardia.  3.  Severe mitral regurgitation/flail posterior leaflet/moderate to severe pulmonary hypertension/HFpEF -She has had severe mitral vegetation with likely a flail posterior leaflet segment for over 1 year.  Now with chest pain and shortness of breath.   RVSP is severely elevated at 68 mmHg. -Planning for left and right heart catheterization today. -Admitted with elevated BNP. -EF is preserved. -Still with significantly elevated JVD. -Continue Lasix 40 mg IV twice daily. -We will get a better idea of hemodynamics on right left heart catheterization today. -I did discuss with her son the overall severity of her condition.  He is in agreement with moving forward with heart catheterization today.  He thinks evaluation for mitral clip is reasonable.  I do have my concerns about mitral valve area as she has significant mitral annular calcification.  She may not be a candidate.  4. HTN -BP stable.  -Continue lisinopril 20 mg daily.  Aldactone added for potassium supplement in the setting of diuresis.  5.  Memory impairment/prior stroke -After discussion with the son she is quite active at home.  She is still driving.  She does have early signs of dementia but is not debilitated.  I am in agreement with proceeding with work-up and evaluation as above.  FEN -No IVF -Code: Full -Diet: N.p.o. -DVT PPx: Heparin drip  For questions or updates, please contact [place] Please consult [email or web address] for contact info under   Time Spent with Patient: I have spent a total of 35 minutes with patient reviewing hospital notes, telemetry, EKGs, labs and examining the patient as well as establishing an assessment and plan that was discussed with the patient.  > 50% of time was spent in direct patient care.    Signed, Addison Naegeli. Audie Box, MD, [facility]  [facility]  07/07/2020 8:59 AM

## 2020-07-08 ENCOUNTER — Encounter (HOSPITAL_COMMUNITY)
Admission: AD | Disposition: A | Discharge status: Skilled Nursing Facility | Payer: Self-pay | Source: Home / Self Care | Attending: Cardiology

## 2020-07-08 ENCOUNTER — Encounter (HOSPITAL_COMMUNITY): Payer: Self-pay | Admitting: Cardiovascular Disease

## 2020-07-08 ENCOUNTER — Inpatient Hospital Stay (HOSPITAL_COMMUNITY): Payer: PPO | Admitting: Anesthesiology

## 2020-07-08 ENCOUNTER — Inpatient Hospital Stay (HOSPITAL_COMMUNITY): Payer: PPO

## 2020-07-08 DIAGNOSIS — I200 Unstable angina: Secondary | ICD-10-CM | POA: Diagnosis not present

## 2020-07-08 DIAGNOSIS — I361 Nonrheumatic tricuspid (valve) insufficiency: Secondary | ICD-10-CM

## 2020-07-08 DIAGNOSIS — I340 Nonrheumatic mitral (valve) insufficiency: Secondary | ICD-10-CM | POA: Diagnosis not present

## 2020-07-08 DIAGNOSIS — I313 Pericardial effusion (noninflammatory): Secondary | ICD-10-CM

## 2020-07-08 HISTORY — PX: BUBBLE STUDY: SHX6837

## 2020-07-08 HISTORY — PX: TEE WITHOUT CARDIOVERSION: SHX5443

## 2020-07-08 LAB — C-REACTIVE PROTEIN: CRP: 18.7 mg/dL — ABNORMAL HIGH (ref <1.0)

## 2020-07-08 LAB — BASIC METABOLIC PANEL
Anion gap: 7 (ref 5–15)
BUN: 17 mg/dL (ref 8–23)
CO2: 28 mmol/L (ref 22–32)
Calcium: 8.6 mg/dL — ABNORMAL LOW (ref 8.9–10.3)
Chloride: 102 mmol/L (ref 98–111)
Creatinine, Ser: 0.88 mg/dL (ref 0.44–1.00)
GFR, Estimated: >60 mL/min (ref >60)
Glucose, Bld: 141 mg/dL — ABNORMAL HIGH (ref 70–99)
Potassium: 4.1 mmol/L (ref 3.5–5.1)
Sodium: 137 mmol/L (ref 135–145)

## 2020-07-08 LAB — HEMOGLOBIN A1C
Hgb A1c MFr Bld: 6.2 % — ABNORMAL HIGH (ref 4.8–5.6)
Mean Plasma Glucose: 131.24 mg/dL

## 2020-07-08 LAB — CBC
HCT: 38.3 % (ref 36.0–46.0)
Hemoglobin: 12.2 g/dL (ref 12.0–15.0)
MCH: 28.7 pg (ref 26.0–34.0)
MCHC: 31.9 g/dL (ref 30.0–36.0)
MCV: 90.1 fL (ref 80.0–100.0)
Platelets: 185 10*3/uL (ref 150–400)
RBC: 4.25 MIL/uL (ref 3.87–5.11)
RDW: 14.2 % (ref 11.5–15.5)
WBC: 13.4 10*3/uL — ABNORMAL HIGH (ref 4.0–10.5)
nRBC: 0 % (ref 0.0–0.2)

## 2020-07-08 LAB — SEDIMENTATION RATE: Sed Rate: 35 mm/hr — ABNORMAL HIGH (ref 0–22)

## 2020-07-08 LAB — ECHO TEE
MV M vel: 4.33 m/s
MV Peak grad: 75 mmHg
Radius: 1 cm

## 2020-07-08 LAB — APTT: aPTT: 44 seconds — ABNORMAL HIGH (ref 24–36)

## 2020-07-08 SURGERY — ECHOCARDIOGRAM, TRANSESOPHAGEAL
Anesthesia: Monitor Anesthesia Care

## 2020-07-08 MED ORDER — IBUPROFEN 200 MG PO TABS: 600.0000 mg | ORAL_TABLET | Freq: Three times a day (TID) | ORAL | Status: DC

## 2020-07-08 MED ORDER — METOPROLOL TARTRATE 25 MG PO TABS
25.0000 mg | ORAL_TABLET | Freq: Two times a day (BID) | ORAL | Status: DC
  Administered 2020-07-08 – 2020-07-15 (×13): 25 mg via ORAL
  Filled 2020-07-08 (×16): qty 1

## 2020-07-08 MED ORDER — SODIUM CHLORIDE 0.9 % IV SOLN: INTRAVENOUS | Status: DC | PRN

## 2020-07-08 MED ORDER — PROPOFOL 500 MG/50ML IV EMUL
INTRAVENOUS | Status: DC | PRN
  Administered 2020-07-08: 50 ug/kg/min via INTRAVENOUS

## 2020-07-08 MED ORDER — APIXABAN 5 MG PO TABS: 5.0000 mg | ORAL_TABLET | Freq: Two times a day (BID) | ORAL | Status: DC

## 2020-07-08 MED ORDER — COLCHICINE 0.6 MG PO TABS: 0.6000 mg | ORAL_TABLET | Freq: Two times a day (BID) | ORAL | Status: DC

## 2020-07-08 MED ORDER — IBUPROFEN 200 MG PO TABS
800.0000 mg | ORAL_TABLET | Freq: Three times a day (TID) | ORAL | Status: DC
  Administered 2020-07-08 – 2020-07-09 (×2): 800 mg via ORAL
  Filled 2020-07-08 (×3): qty 4

## 2020-07-08 MED ORDER — EPHEDRINE SULFATE-NACL 50-0.9 MG/10ML-% IV SOSY
PREFILLED_SYRINGE | INTRAVENOUS | Status: DC | PRN
  Administered 2020-07-08: 10 mg via INTRAVENOUS
  Administered 2020-07-08: 5 mg via INTRAVENOUS

## 2020-07-08 MED ORDER — PROPOFOL 10 MG/ML IV BOLUS
INTRAVENOUS | Status: DC | PRN
  Administered 2020-07-08: 60 mg via INTRAVENOUS
  Administered 2020-07-08: 20 mg via INTRAVENOUS

## 2020-07-08 MED ORDER — COLCHICINE 0.6 MG PO TABS
0.6000 mg | ORAL_TABLET | Freq: Two times a day (BID) | ORAL | Status: AC
  Administered 2020-07-08 – 2020-07-09 (×2): 0.6 mg via ORAL
  Filled 2020-07-08 (×2): qty 1

## 2020-07-08 MED ORDER — COLCHICINE 0.6 MG PO TABS
0.6000 mg | ORAL_TABLET | Freq: Every day | ORAL | Status: DC
  Administered 2020-07-10 – 2020-07-15 (×6): 0.6 mg via ORAL
  Filled 2020-07-08 (×6): qty 1

## 2020-07-08 MED ORDER — TRAMADOL HCL 50 MG PO TABS
50.0000 mg | ORAL_TABLET | Freq: Four times a day (QID) | ORAL | Status: DC | PRN
  Administered 2020-07-09 (×2): 50 mg via ORAL
  Filled 2020-07-08 (×2): qty 1

## 2020-07-08 MED ORDER — LIDOCAINE 2% (20 MG/ML) 5 ML SYRINGE
INTRAMUSCULAR | Status: DC | PRN
  Administered 2020-07-08: 60 mg via INTRAVENOUS

## 2020-07-08 MED ORDER — PHENYLEPHRINE 40 MCG/ML (10ML) SYRINGE FOR IV PUSH (FOR BLOOD PRESSURE SUPPORT)
PREFILLED_SYRINGE | INTRAVENOUS | Status: DC | PRN
  Administered 2020-07-08 (×2): 80 ug via INTRAVENOUS

## 2020-07-08 MED ORDER — NITROGLYCERIN 0.4 MG SL SUBL
SUBLINGUAL_TABLET | SUBLINGUAL | Status: AC
  Filled 2020-07-08: qty 1

## 2020-07-08 MED ORDER — ACETAMINOPHEN 325 MG PO TABS: 650.0000 mg | ORAL_TABLET | Freq: Four times a day (QID) | ORAL | Status: DC | PRN

## 2020-07-08 MED FILL — Morphine Sulfate Inj 4 MG/ML: INTRAMUSCULAR | Qty: 0.5 | Status: AC

## 2020-07-08 MED FILL — Midazolam HCl Inj 5 MG/5ML (Base Equivalent): INTRAMUSCULAR | Qty: 5 | Status: AC

## 2020-07-08 NOTE — Interval H&P Note (Signed)
History and Physical Interval Note:  07/08/2020 10:09 AM  Morgan Boyer  has presented today for surgery, with the diagnosis of SEVERE MR.  The various methods of treatment have been discussed with the patient and family. After consideration of risks, benefits and other options for treatment, the patient has consented to  Procedure(s): TRANSESOPHAGEAL ECHOCARDIOGRAM (TEE) (N/A) as a surgical intervention.  The patient's history has been reviewed, patient examined, no change in status, stable for surgery.  I have reviewed the patient's chart and labs.  Questions were answered to the patient's satisfaction.     Yaniel Limbaugh Harrell Gave

## 2020-07-08 NOTE — H&P (View-Only) (Signed)
Cardiology Progress Note  Patient ID: Morgan Boyer MRN: [id number] DOB: 08/02/1931 Date of Encounter: 07/08/2020  Primary Cardiologist: Morgan McAlhany, MD  Subjective   Chief Complaint: Chest pain  HPI: No significant CAD.  Continues to have chest pain.  Tylenol and tramadol ordered.  Back in A. fib.  Rate controlled on amiodarone.  Volume status acceptable. NPO for TEE.   ROS:  All other ROS reviewed and negative. Pertinent positives noted in the HPI.     Inpatient Medications  Scheduled Meds: . aspirin  81 mg Oral Daily  . B-complex with vitamin C  1 tablet Oral Daily  . enoxaparin (LOVENOX) injection  40 mg Subcutaneous Q24H  . gabapentin  300 mg Oral QHS  . lisinopril  20 mg Oral BID  . nitroGLYCERIN  0.5 inch Topical Q6H  . nitroGLYCERIN      . pantoprazole  40 mg Oral Daily  . potassium chloride  40 mEq Oral BID  . simvastatin  20 mg Oral QPM  . sodium chloride flush  3 mL Intravenous Q12H  . sodium chloride flush  3 mL Intravenous Q12H  . spironolactone  12.5 mg Oral Daily   Continuous Infusions: . sodium chloride    . sodium chloride 20 mL/hr at 07/07/20 2331  . sodium chloride    . amiodarone 30 mg/hr (07/08/20 0034)   PRN Meds: sodium chloride, acetaminophen, Muscle Rub, nitroGLYCERIN, ondansetron (ZOFRAN) IV, polyvinyl alcohol, sodium chloride flush   Vital Signs   Vitals:   07/07/20 2250 07/07/20 2316 07/08/20 0343 07/08/20 0722  BP: (!) 146/77 (!) 159/84 135/75 (!) 146/99  Pulse: 63 64 63 90  Resp:  19 19 (!) 23  Temp:  97.8 F (36.6 C) 98.1 F (36.7 C) 98.2 F (36.8 C)  TempSrc:  Oral Oral Oral  SpO2: 98% 98% 94% 97%  Weight:   64.4 kg     Intake/Output Summary (Last 24 hours) at 07/08/2020 0751 Last data filed at 07/08/2020 0343 Gross per 24 hour  Intake 446.72 ml  Output --  Net 446.72 ml   Last 3 Weights 07/08/2020 07/07/2020 07/07/2020  Weight (lbs) 141 lb 15.6 oz 135 lb 12.9 oz 135 lb 12.9 oz  Weight (kg) 64.4 kg 61.6 kg 61.6 kg       Telemetry  Overnight telemetry shows atrial fibrillation heart rate in the 80s, which I personally reviewed.   ECG  The most recent ECG shows sinus rhythm, which I personally reviewed.   Physical Exam   Vitals:   07/07/20 2250 07/07/20 2316 07/08/20 0343 07/08/20 0722  BP: (!) 146/77 (!) 159/84 135/75 (!) 146/99  Pulse: 63 64 63 90  Resp:  19 19 (!) 23  Temp:  97.8 F (36.6 C) 98.1 F (36.7 C) 98.2 F (36.8 C)  TempSrc:  Oral Oral Oral  SpO2: 98% 98% 94% 97%  Weight:   64.4 kg      Intake/Output Summary (Last 24 hours) at 07/08/2020 0751 Last data filed at 07/08/2020 0343 Gross per 24 hour  Intake 446.72 ml  Output --  Net 446.72 ml    Last 3 Weights 07/08/2020 07/07/2020 07/07/2020  Weight (lbs) 141 lb 15.6 oz 135 lb 12.9 oz 135 lb 12.9 oz  Weight (kg) 64.4 kg 61.6 kg 61.6 kg    Body mass index is 25.97 kg/m.   General: Well nourished, well developed, in no acute distress Head: Atraumatic, normal size  Eyes: PEERLA, EOMI  Neck: Supple, no JVD Endocrine: No thryomegaly Cardiac:   Normal S1, S2; irregular rhythm, 3-6 harsh holosystolic murmur Lungs: Diminished breath sounds at the lung bases Abd: Soft, nontender, no hepatomegaly  Ext: No edema, pulses 2+ Musculoskeletal: No deformities, BUE and BLE strength normal and equal Skin: Warm and dry, no rashes   Neuro: Alert, awake, oriented to person place and time.  She is a bit confused about procedure she had yesterday.  Labs  High Sensitivity Troponin:   Recent Labs  Lab 07/05/20 1911 07/06/20 0841 07/06/20 1037  TROPONINIHS 19* 15 16     Cardiac EnzymesNo results for input(s): TROPONINI in the last 168 hours. No results for input(s): TROPIPOC in the last 168 hours.  Chemistry Recent Labs  Lab 07/05/20 1911 07/06/20 0337 07/07/20 0652 07/07/20 1347 07/07/20 1348 07/07/20 1406 07/08/20 0010  NA 139 140 137   < > 140 139 137  K 3.4* 3.5 3.4*   < > 3.9 3.9 4.1  CL 104 103 102  --   --   --  102  CO2 26 30 26   --   --   --  28  GLUCOSE 119* 150* 150*  --   --   --  141*  BUN 14 12 17  --   --   --  17  CREATININE 0.79 0.90 0.77  --   --   --  0.88  CALCIUM 8.8* 8.7* 8.8*  --   --   --  8.6*  PROT 6.6  --   --   --   --   --   --   ALBUMIN 3.8  --   --   --   --   --   --   AST 29  --   --   --   --   --   --   ALT 18  --   --   --   --   --   --   ALKPHOS 50  --   --   --   --   --   --   BILITOT 2.0*  --   --   --   --   --   --   GFRNONAA >60 >60 >60  --   --   --  >60  ANIONGAP 9 7 9  --   --   --  7   < > = values in this interval not displayed.    Hematology Recent Labs  Lab 07/06/20 0653 07/07/20 0054 07/07/20 1347 07/07/20 1348 07/07/20 1406 07/08/20 0010  WBC 11.9* 14.7*  --   --   --  13.4*  RBC 3.99 4.22  --   --   --  4.25  HGB 11.7* 12.2   < > 12.2 11.6* 12.2  HCT 35.7* 37.2   < > 36.0 34.0* 38.3  MCV 89.5 88.2  --   --   --  90.1  MCH 29.3 28.9  --   --   --  28.7  MCHC 32.8 32.8  --   --   --  31.9  RDW 14.2 14.3  --   --   --  14.2  PLT 162 173  --   --   --  185   < > = values in this interval not displayed.   BNP Recent Labs  Lab 07/06/20 0653  BNP 611.9*    DDimer No results for input(s): DDIMER in the last 168 hours.   Radiology  CARDIAC CATHETERIZATION    Result Date: 07/07/2020  Ost LAD to Prox LAD lesion is 40% stenosed.  Severe mitral annular calcification. Mild nonobstructive CAD with 40% smooth proximal LAD stenosis with mild irregularity,and minimal luminal irregularity in the left circumflex and  dominant RCA Moderate right heart pressure elevation with peak V wave in the 36-40 range on the initial PW tracing, consistent with the patient's significant mitral regurgitation. Very mild aortic stenosis. RECOMMENDATION: Patient will be undergoing a TEE tentatively scheduled for tomorrow for further evaluation of her mitral regurgitation.   ECHOCARDIOGRAM COMPLETE  Result Date: 07/06/2020    ECHOCARDIOGRAM REPORT   Patient Name:   Morgan Boyer Date of  Exam: 07/06/2020 Medical Rec #:  [id number]       Height:       62.0 in Accession #:    [id number]      Weight:       143.5 lb Date of Birth:  09/07/1931        BSA:          1.660 m Patient Age:    88 years        BP:           110/61 mmHg Patient Gender: F               HR:           68 bpm. Exam Location:  Inpatient Procedure: 2D Echo, Cardiac Doppler and Color Doppler Indications:    Mitral valve disorder  History:        Patient has prior history of Echocardiogram examinations, most                 recent 07/22/2019. Mitral Valve Disease, Arrythmias:Atrial                 Fibrillation; Risk Factors:Hypertension and Dyslipidemia.  Sonographer:    Arthur Guy RDCS (AE) Referring Phys: [id number] LAURA R INGOLD IMPRESSIONS  1. Left ventricular ejection fraction, by estimation, is 60 to 65%. The left ventricle has normal function. The left ventricle has no regional wall motion abnormalities. There is moderate left ventricular hypertrophy. Left ventricular diastolic function  could not be evaluated.  2. The pericardial effusion is posterior to the left ventricle.  3. The mitral annulus is calcified and the posterior leafelt appears restricted at the base and the tip of the leaflet prolapses. There is severe eccentric anterior and medially directed mitral regurgitation. The mitral valve is degenerative. Severe mitral valve regurgitation.  4. Tricuspid valve regurgitation is mild to moderate.  5. The aortic valve is tricuspid. Aortic valve regurgitation is not visualized. Moderate aortic valve stenosis. Aortic valve area, by VTI measures 1.22 cm. AVA by planimetry was 1.31 cm2.  6. Left atrial size was severely dilated.  7. Right ventricular systolic function is normal. The right ventricular size is normal. There is severely elevated pulmonary artery systolic pressure.  8. The inferior vena cava is normal in size with <50% respiratory variability, suggesting right atrial pressure of 8 mmHg. Comparison(s): Changes from prior study  are noted. 07/22/2019: LVEF 55-60%, moderate to severe MR. FINDINGS  Left Ventricle: Left ventricular ejection fraction, by estimation, is 60 to 65%. The left ventricle has normal function. The left ventricle has no regional wall motion abnormalities. The left ventricular internal cavity size was normal in size. There is  moderate left ventricular hypertrophy. Left ventricular diastolic function could not be evaluated due to mitral annular calcification (moderate or greater). Left ventricular diastolic function could not   be evaluated. Right Ventricle: The right ventricular size is normal. No increase in right ventricular wall thickness. Right ventricular systolic function is normal. There is severely elevated pulmonary artery systolic pressure. The tricuspid regurgitant velocity is 3.87 m/s, and with an assumed right atrial pressure of 8 mmHg, the estimated right ventricular systolic pressure is 67.9 mmHg. Left Atrium: Left atrial size was severely dilated. Right Atrium: Right atrial size was normal in size. Pericardium: Trivial pericardial effusion is present. The pericardial effusion is posterior to the left ventricle. Mitral Valve: The mitral annulus is calcified and the posterior leafelt appears restricted at the base and the tip of the leaflet prolapses. There is severe eccentric anterior and medially directed mitral regurgitation. The mitral valve is degenerative in appearance. Mild to moderate mitral annular calcification. Severe mitral valve regurgitation, with anteriorly-directed jet. MV peak gradient, 13.2 mmHg. The mean mitral valve gradient is 2.0 mmHg. Tricuspid Valve: The tricuspid valve is not well visualized. Tricuspid valve regurgitation is mild to moderate. Aortic Valve: The aortic valve is tricuspid. Aortic valve regurgitation is not visualized. Moderate aortic stenosis is present. Aortic valve mean gradient measures 8.0 mmHg. Aortic valve peak gradient measures 14.6 mmHg. Aortic valve area, by VTI  measures 1.22 cm. Pulmonic Valve: The pulmonic valve was normal in structure. Pulmonic valve regurgitation is not visualized. Aorta: The aortic root and ascending aorta are structurally normal, with no evidence of dilitation. Venous: The inferior vena cava is normal in size with less than 50% respiratory variability, suggesting right atrial pressure of 8 mmHg. IAS/Shunts: No atrial level shunt detected by color flow Doppler.  LEFT VENTRICLE PLAX 2D LVIDd:         4.70 cm LVIDs:         2.80 cm LV PW:         1.50 cm LV IVS:        1.40 cm LVOT diam:     1.90 cm LV SV:         39 LV SV Index:   23 LVOT Area:     2.84 cm  RIGHT VENTRICLE            IVC RV Basal diam:  3.40 cm    IVC diam: 1.80 cm RV S prime:     7.01 cm/s TAPSE (M-mode): 1.7 cm LEFT ATRIUM           Index       RIGHT ATRIUM           Index LA diam:      4.00 cm 2.41 cm/m  RA Area:     12.80 cm LA Vol (A4C): 99.6 ml 59.99 ml/m RA Volume:   25.30 ml  15.24 ml/m  AORTIC VALVE AV Area (Vmax):    1.21 cm AV Area (Vmean):   1.16 cm AV Area (VTI):     1.22 cm AV Vmax:           191.00 cm/s AV Vmean:          130.000 cm/s AV VTI:            0.315 m AV Peak Grad:      14.6 mmHg AV Mean Grad:      8.0 mmHg LVOT Vmax:         81.60 cm/s LVOT Vmean:        53.200 cm/s LVOT VTI:          0.136 m LVOT/AV VTI ratio: 0.43  AORTA Ao Root diam:   3.30 cm Ao Asc diam:  3.40 cm MITRAL VALVE                 TRICUSPID VALVE MV Area (PHT): 2.97 cm      TR Peak grad:   59.9 mmHg MV Peak grad:  13.2 mmHg     TR Vmax:        387.00 cm/s MV Mean grad:  2.0 mmHg MV Vmax:       1.82 m/s      SHUNTS MV Vmean:      67.7 cm/s     Systemic VTI:  0.14 m MR Peak grad:    92.9 mmHg   Systemic Diam: 1.90 cm MR Mean grad:    56.0 mmHg MR Vmax:         482.00 cm/s MR Vmean:        349.0 cm/s MR PISA:         6.28 cm MR PISA Eff ROA: 52 mm MR PISA Radius:  1.00 cm Lyman Bishop MD Electronically signed by Lyman Bishop MD Signature Date/Time: 07/06/2020/4:20:38 PM    Final      Cardiac Studies  LHC 07/07/2020   Ost LAD to Prox LAD lesion is 40% stenosed.   Severe mitral annular calcification.  Mild nonobstructive CAD with 40% smooth proximal LAD stenosis with mild irregularity,and minimal luminal irregularity in the left circumflex and  dominant RCA  Moderate right heart pressure elevation with peak V wave in the 36-40 range on the initial PW tracing, consistent with the patient's significant mitral regurgitation.  Very mild aortic stenosis.  RECOMMENDATION: Patient will be undergoing a TEE tentatively scheduled for tomorrow for further evaluation of her mitral regurgitation.  TTE 07/06/2020   1. Left ventricular ejection fraction, by estimation, is 60 to 65%. The  left ventricle has normal function. The left ventricle has no regional  wall motion abnormalities. There is moderate left ventricular hypertrophy.  Left ventricular diastolic function  could not be evaluated.  2. The pericardial effusion is posterior to the left ventricle.  3. The mitral annulus is calcified and the posterior leafelt appears  restricted at the base and the tip of the leaflet prolapses. There is  severe eccentric anterior and medially directed mitral regurgitation. The  mitral valve is degenerative. Severe  mitral valve regurgitation.  4. Tricuspid valve regurgitation is mild to moderate.  5. The aortic valve is tricuspid. Aortic valve regurgitation is not  visualized. Moderate aortic valve stenosis. Aortic valve area, by VTI  measures 1.22 cm. AVA by planimetry was 1.31 cm2.  6. Left atrial size was severely dilated.  7. Right ventricular systolic function is normal. The right ventricular  size is normal. There is severely elevated pulmonary artery systolic  pressure.  8. The inferior vena cava is normal in size with <50% respiratory  variability, suggesting right atrial pressure of 8 mmHg.     Patient Profile  87 year old female with hypertension,  hyperlipidemia, memory disorder, paroxysmal atrial fibrillation on anticoagulation, severe mitral vegetation who was admitted on 07/05/2020 with symptoms concerning for unstable angina.  Assessment & Plan   1. Unstable Angina/CAD -Admitted with unstable angina. LHC showed 40% ostial LCX. No significant CAD.  -stop heparin -continue aspirin/statin. LDL at goal -Continue aspirin statin  2.  Chest pain, noncardiac -Continues to have chest pain.  Could be acid reflux related versus related to her mitral regurgitation. -Tylenol/tramadol ordered. -Continue Protonix -Symptoms inconsistent with pericarditis.  I will check an ESR and CRP  just to make sure.  She describes constant pressure that is not associated with position.  Does not appear to be sharp.  Described as pressure.  3. Afib with RVR -hx of paroxysmal Afib  -Back in A. fib this morning.  Has had episodes of A. fib this admission. -continue amiodarone drip today. Transition to oral amiodarone after TEE. -restart eliquis today -We will add back her beta-blocker.  4. Severe MR/Flailed PMVL, moderate to severe pHTN/HFpEF -large V waves on right heart cath -LVEDP 12 mmHG -appears euvolemic -plans for TEE today for clip evaluation  -start lasix 40 mg po daily tomorrow   5. HTN -continue lisinopril  20 mg daily  6. Memory impairment/CVA -She is a bit confused today.  She is active and independent per her son.  She is still driving.  Suspect she is becoming a bit delirious. -Plans for PT and OT evaluation later today.  FEN -No intravenous fluids -Diet: N.p.o. for TEE -DVT PPx: Restart home Eliquis -Code: Full  For questions or updates, please contact [facility] Please consult [email or web address] for contact info under   Time Spent with Patient: I have spent a total of 35 minutes with patient reviewing hospital notes, telemetry, EKGs, labs and examining the patient as well as establishing an assessment and plan that was  discussed with the patient.  > 50% of time was spent in direct patient care.    Signed, Warren T. O'Neal, MD, FACC [place]  [facility]  07/08/2020 7:51 AM   

## 2020-07-08 NOTE — Progress Notes (Signed)
Cardiology Progress Note  Patient ID: Morgan Boyer MRN: [id number] DOB: 12/07/1931 Date of Encounter: 07/08/2020  Primary Cardiologist: Christopher McAlhany, MD  Subjective   Chief Complaint: Chest pain  HPI: No significant CAD.  Continues to have chest pain.  Tylenol and tramadol ordered.  Back in A. fib.  Rate controlled on amiodarone.  Volume status acceptable. NPO for TEE.   ROS:  All other ROS reviewed and negative. Pertinent positives noted in the HPI.     Inpatient Medications  Scheduled Meds: . aspirin  81 mg Oral Daily  . B-complex with vitamin C  1 tablet Oral Daily  . enoxaparin (LOVENOX) injection  40 mg Subcutaneous Q24H  . gabapentin  300 mg Oral QHS  . lisinopril  20 mg Oral BID  . nitroGLYCERIN  0.5 inch Topical Q6H  . nitroGLYCERIN      . pantoprazole  40 mg Oral Daily  . potassium chloride  40 mEq Oral BID  . simvastatin  20 mg Oral QPM  . sodium chloride flush  3 mL Intravenous Q12H  . sodium chloride flush  3 mL Intravenous Q12H  . spironolactone  12.5 mg Oral Daily   Continuous Infusions: . sodium chloride    . sodium chloride 20 mL/hr at 07/07/20 2331  . sodium chloride    . amiodarone 30 mg/hr (07/08/20 0034)   PRN Meds: sodium chloride, acetaminophen, Muscle Rub, nitroGLYCERIN, ondansetron (ZOFRAN) IV, polyvinyl alcohol, sodium chloride flush   Vital Signs   Vitals:   07/07/20 2250 07/07/20 2316 07/08/20 0343 07/08/20 0722  BP: (!) 146/77 (!) 159/84 135/75 (!) 146/99  Pulse: 63 64 63 90  Resp:  19 19 (!) 23  Temp:  97.8 F (36.6 C) 98.1 F (36.7 C) 98.2 F (36.8 C)  TempSrc:  Oral Oral Oral  SpO2: 98% 98% 94% 97%  Weight:   64.4 kg     Intake/Output Summary (Last 24 hours) at 07/08/2020 0751 Last data filed at 07/08/2020 0343 Gross per 24 hour  Intake 446.72 ml  Output --  Net 446.72 ml   Last 3 Weights 07/08/2020 07/07/2020 07/07/2020  Weight (lbs) 141 lb 15.6 oz 135 lb 12.9 oz 135 lb 12.9 oz  Weight (kg) 64.4 kg 61.6 kg 61.6 kg       Telemetry  Overnight telemetry shows atrial fibrillation heart rate in the 80s, which I personally reviewed.   ECG  The most recent ECG shows sinus rhythm, which I personally reviewed.   Physical Exam   Vitals:   07/07/20 2250 07/07/20 2316 07/08/20 0343 07/08/20 0722  BP: (!) 146/77 (!) 159/84 135/75 (!) 146/99  Pulse: 63 64 63 90  Resp:  19 19 (!) 23  Temp:  97.8 F (36.6 C) 98.1 F (36.7 C) 98.2 F (36.8 C)  TempSrc:  Oral Oral Oral  SpO2: 98% 98% 94% 97%  Weight:   64.4 kg      Intake/Output Summary (Last 24 hours) at 07/08/2020 0751 Last data filed at 07/08/2020 0343 Gross per 24 hour  Intake 446.72 ml  Output --  Net 446.72 ml    Last 3 Weights 07/08/2020 07/07/2020 07/07/2020  Weight (lbs) 141 lb 15.6 oz 135 lb 12.9 oz 135 lb 12.9 oz  Weight (kg) 64.4 kg 61.6 kg 61.6 kg    Body mass index is 25.97 kg/m.   General: Well nourished, well developed, in no acute distress Head: Atraumatic, normal size  Eyes: PEERLA, EOMI  Neck: Supple, no JVD Endocrine: No thryomegaly Cardiac:   Normal S1, S2; irregular rhythm, 3-6 harsh holosystolic murmur Lungs: Diminished breath sounds at the lung bases Abd: Soft, nontender, no hepatomegaly  Ext: No edema, pulses 2+ Musculoskeletal: No deformities, BUE and BLE strength normal and equal Skin: Warm and dry, no rashes   Neuro: Alert, awake, oriented to person place and time.  She is a bit confused about procedure she had yesterday.  Labs  High Sensitivity Troponin:   Recent Labs  Lab 07/05/20 1911 07/06/20 0841 07/06/20 1037  TROPONINIHS 19* 15 16     Cardiac EnzymesNo results for input(s): TROPONINI in the last 168 hours. No results for input(s): TROPIPOC in the last 168 hours.  Chemistry Recent Labs  Lab 07/05/20 1911 07/06/20 0337 07/07/20 0652 07/07/20 1347 07/07/20 1348 07/07/20 1406 07/08/20 0010  NA 139 140 137   < > 140 139 137  K 3.4* 3.5 3.4*   < > 3.9 3.9 4.1  CL 104 103 102  --   --   --  102  CO2 _0 --   --   --  28  GLUCOSE 119* 150* 150*  --   --   --  141*  BUN _1 --   --   --  17  CREATININE 0.79 0.90 0.77  --   --   --  0.88  CALCIUM 8.8* 8.7* 8.8*  --   --   --  8.6*  PROT 6.6  --   --   --   --   --   --   ALBUMIN 3.8  --   --   --   --   --   --   AST 29  --   --   --   --   --   --   ALT 18  --   --   --   --   --   --   ALKPHOS 50  --   --   --   --   --   --   BILITOT 2.0*  --   --   --   --   --   --   GFRNONAA >60 >60 >60  --   --   --  >60  ANIONGAP _2 --   --   --  7   < > = values in this interval not displayed.    Hematology Recent Labs  Lab 07/06/20 0653 07/07/20 0054 07/07/20 1347 07/07/20 1348 07/07/20 1406 07/08/20 0010  WBC 11.9* 14.7*  --   --   --  13.4*  RBC 3.99 4.22  --   --   --  4.25  HGB 11.7* 12.2   < > 12.2 11.6* 12.2  HCT 35.7* 37.2   < > 36.0 34.0* 38.3  MCV 89.5 88.2  --   --   --  90.1  MCH 29.3 28.9  --   --   --  28.7  MCHC 32.8 32.8  --   --   --  31.9  RDW 14.2 14.3  --   --   --  14.2  PLT 162 173  --   --   --  185   < > = values in this interval not displayed.   BNP Recent Labs  Lab 07/06/20 0653  BNP 611.9*    DDimer No results for input(s): DDIMER in the last 168 hours.   Radiology  CARDIAC CATHETERIZATION  Result Date: 07/07/2020  Ost LAD to Prox LAD lesion is 40% stenosed.  Severe mitral annular calcification. Mild nonobstructive CAD with 40% smooth proximal LAD stenosis with mild irregularity,and minimal luminal irregularity in the left circumflex and  dominant RCA Moderate right heart pressure elevation with peak V wave in the 36-40 range on the initial PW tracing, consistent with the patient's significant mitral regurgitation. Very mild aortic stenosis. RECOMMENDATION: Patient will be undergoing a TEE tentatively scheduled for tomorrow for further evaluation of her mitral regurgitation.   ECHOCARDIOGRAM COMPLETE  Result Date: 07/06/2020    ECHOCARDIOGRAM REPORT   Patient Name:   Morgan Boyer Date of  Exam: 07/06/2020 Medical Rec #:  [id number]       Height:       62.0 in Accession #:    [id number]      Weight:       143.5 lb Date of Birth:  1931-05-17        BSA:          1.660 m Patient Age:    42 years        BP:           110/61 mmHg Patient Gender: F               HR:           68 bpm. Exam Location:  Inpatient Procedure: 2D Echo, Cardiac Doppler and Color Doppler Indications:    Mitral valve disorder  History:        Patient has prior history of Echocardiogram examinations, most                 recent 07/22/2019. Mitral Valve Disease, Arrythmias:Atrial                 Fibrillation; Risk Factors:Hypertension and Dyslipidemia.  Sonographer:    Clayton Lefort RDCS (AE) Referring Phys: [place]  1. Left ventricular ejection fraction, by estimation, is 60 to 65%. The left ventricle has normal function. The left ventricle has no regional wall motion abnormalities. There is moderate left ventricular hypertrophy. Left ventricular diastolic function  could not be evaluated.  2. The pericardial effusion is posterior to the left ventricle.  3. The mitral annulus is calcified and the posterior leafelt appears restricted at the base and the tip of the leaflet prolapses. There is severe eccentric anterior and medially directed mitral regurgitation. The mitral valve is degenerative. Severe mitral valve regurgitation.  4. Tricuspid valve regurgitation is mild to moderate.  5. The aortic valve is tricuspid. Aortic valve regurgitation is not visualized. Moderate aortic valve stenosis. Aortic valve area, by VTI measures 1.22 cm. AVA by planimetry was 1.31 cm2.  6. Left atrial size was severely dilated.  7. Right ventricular systolic function is normal. The right ventricular size is normal. There is severely elevated pulmonary artery systolic pressure.  8. The inferior vena cava is normal in size with <50% respiratory variability, suggesting right atrial pressure of 8 mmHg. Comparison(s): Changes from prior study  are noted. 07/22/2019: LVEF 55-60%, moderate to severe MR. FINDINGS  Left Ventricle: Left ventricular ejection fraction, by estimation, is 60 to 65%. The left ventricle has normal function. The left ventricle has no regional wall motion abnormalities. The left ventricular internal cavity size was normal in size. There is  moderate left ventricular hypertrophy. Left ventricular diastolic function could not be evaluated due to mitral annular calcification (moderate or greater). Left ventricular diastolic function could not  be evaluated. Right Ventricle: The right ventricular size is normal. No increase in right ventricular wall thickness. Right ventricular systolic function is normal. There is severely elevated pulmonary artery systolic pressure. The tricuspid regurgitant velocity is 3.87 m/s, and with an assumed right atrial pressure of 8 mmHg, the estimated right ventricular systolic pressure is 67.9 mmHg. Left Atrium: Left atrial size was severely dilated. Right Atrium: Right atrial size was normal in size. Pericardium: Trivial pericardial effusion is present. The pericardial effusion is posterior to the left ventricle. Mitral Valve: The mitral annulus is calcified and the posterior leafelt appears restricted at the base and the tip of the leaflet prolapses. There is severe eccentric anterior and medially directed mitral regurgitation. The mitral valve is degenerative in appearance. Mild to moderate mitral annular calcification. Severe mitral valve regurgitation, with anteriorly-directed jet. MV peak gradient, 13.2 mmHg. The mean mitral valve gradient is 2.0 mmHg. Tricuspid Valve: The tricuspid valve is not well visualized. Tricuspid valve regurgitation is mild to moderate. Aortic Valve: The aortic valve is tricuspid. Aortic valve regurgitation is not visualized. Moderate aortic stenosis is present. Aortic valve mean gradient measures 8.0 mmHg. Aortic valve peak gradient measures 14.6 mmHg. Aortic valve area, by VTI  measures 1.22 cm. Pulmonic Valve: The pulmonic valve was normal in structure. Pulmonic valve regurgitation is not visualized. Aorta: The aortic root and ascending aorta are structurally normal, with no evidence of dilitation. Venous: The inferior vena cava is normal in size with less than 50% respiratory variability, suggesting right atrial pressure of 8 mmHg. IAS/Shunts: No atrial level shunt detected by color flow Doppler.  LEFT VENTRICLE PLAX 2D LVIDd:         4.70 cm LVIDs:         2.80 cm LV PW:         1.50 cm LV IVS:        1.40 cm LVOT diam:     1.90 cm LV SV:         39 LV SV Index:   23 LVOT Area:     2.84 cm  RIGHT VENTRICLE            IVC RV Basal diam:  3.40 cm    IVC diam: 1.80 cm RV S prime:     7.01 cm/s TAPSE (M-mode): 1.7 cm LEFT ATRIUM           Index       RIGHT ATRIUM           Index LA diam:      4.00 cm 2.41 cm/m  RA Area:     12.80 cm LA Vol (A4C): 99.6 ml 59.99 ml/m RA Volume:   25.30 ml  15.24 ml/m  AORTIC VALVE AV Area (Vmax):    1.21 cm AV Area (Vmean):   1.16 cm AV Area (VTI):     1.22 cm AV Vmax:           191.00 cm/s AV Vmean:          130.000 cm/s AV VTI:            0.315 m AV Peak Grad:      14.6 mmHg AV Mean Grad:      8.0 mmHg LVOT Vmax:         81.60 cm/s LVOT Vmean:        53.200 cm/s LVOT VTI:          0.136 m LVOT/AV VTI ratio: 0.43  AORTA Ao Root diam:   3.30 cm Ao Asc diam:  3.40 cm MITRAL VALVE                 TRICUSPID VALVE MV Area (PHT): 2.97 cm      TR Peak grad:   59.9 mmHg MV Peak grad:  13.2 mmHg     TR Vmax:        387.00 cm/s MV Mean grad:  2.0 mmHg MV Vmax:       1.82 m/s      SHUNTS MV Vmean:      67.7 cm/s     Systemic VTI:  0.14 m MR Peak grad:    92.9 mmHg   Systemic Diam: 1.90 cm MR Mean grad:    56.0 mmHg MR Vmax:         482.00 cm/s MR Vmean:        349.0 cm/s MR PISA:         6.28 cm MR PISA Eff ROA: 52 mm MR PISA Radius:  1.00 cm Lyman Bishop MD Electronically signed by Lyman Bishop MD Signature Date/Time: 07/06/2020/4:20:38 PM    Final      Cardiac Studies  LHC 07/07/2020   Ost LAD to Prox LAD lesion is 40% stenosed.   Severe mitral annular calcification.  Mild nonobstructive CAD with 40% smooth proximal LAD stenosis with mild irregularity,and minimal luminal irregularity in the left circumflex and  dominant RCA  Moderate right heart pressure elevation with peak V wave in the 36-40 range on the initial PW tracing, consistent with the patient's significant mitral regurgitation.  Very mild aortic stenosis.  RECOMMENDATION: Patient will be undergoing a TEE tentatively scheduled for tomorrow for further evaluation of her mitral regurgitation.  TTE 07/06/2020   1. Left ventricular ejection fraction, by estimation, is 60 to 65%. The  left ventricle has normal function. The left ventricle has no regional  wall motion abnormalities. There is moderate left ventricular hypertrophy.  Left ventricular diastolic function  could not be evaluated.  2. The pericardial effusion is posterior to the left ventricle.  3. The mitral annulus is calcified and the posterior leafelt appears  restricted at the base and the tip of the leaflet prolapses. There is  severe eccentric anterior and medially directed mitral regurgitation. The  mitral valve is degenerative. Severe  mitral valve regurgitation.  4. Tricuspid valve regurgitation is mild to moderate.  5. The aortic valve is tricuspid. Aortic valve regurgitation is not  visualized. Moderate aortic valve stenosis. Aortic valve area, by VTI  measures 1.22 cm. AVA by planimetry was 1.31 cm2.  6. Left atrial size was severely dilated.  7. Right ventricular systolic function is normal. The right ventricular  size is normal. There is severely elevated pulmonary artery systolic  pressure.  8. The inferior vena cava is normal in size with <50% respiratory  variability, suggesting right atrial pressure of 8 mmHg.     Patient Profile  87 year old female with hypertension,  hyperlipidemia, memory disorder, paroxysmal atrial fibrillation on anticoagulation, severe mitral vegetation who was admitted on 07/05/2020 with symptoms concerning for unstable angina.  Assessment & Plan   1. Unstable Angina/CAD -Admitted with unstable angina. LHC showed 40% ostial LCX. No significant CAD.  -stop heparin -continue aspirin/statin. LDL at goal -Continue aspirin statin  2.  Chest pain, noncardiac -Continues to have chest pain.  Could be acid reflux related versus related to her mitral regurgitation. -Tylenol/tramadol ordered. -Continue Protonix -Symptoms inconsistent with pericarditis.  I will check an ESR and CRP  just to make sure.  She describes constant pressure that is not associated with position.  Does not appear to be sharp.  Described as pressure.  3. Afib with RVR -hx of paroxysmal Afib  -Back in A. fib this morning.  Has had episodes of A. fib this admission. -continue amiodarone drip today. Transition to oral amiodarone after TEE. -restart eliquis today -We will add back her beta-blocker.  4. Severe MR/Flailed PMVL, moderate to severe pHTN/HFpEF -large V waves on right heart cath -LVEDP 12 mmHG -appears euvolemic -plans for TEE today for clip evaluation  -start lasix 40 mg po daily tomorrow   5. HTN -continue lisinopril  20 mg daily  6. Memory impairment/CVA -She is a bit confused today.  She is active and independent per her son.  She is still driving.  Suspect she is becoming a bit delirious. -Plans for PT and OT evaluation later today.  FEN -No intravenous fluids -Diet: N.p.o. for TEE -DVT PPx: Restart home Eliquis -Code: Full  For questions or updates, please contact [facility] Please consult [email or web address] for contact info under   Time Spent with Patient: I have spent a total of 35 minutes with patient reviewing hospital notes, telemetry, EKGs, labs and examining the patient as well as establishing an assessment and plan that was  discussed with the patient.  > 50% of time was spent in direct patient care.    Signed, [place] T. O'Neal, MD, FACC [place]  [facility]  07/08/2020 7:51 AM   

## 2020-07-08 NOTE — Anesthesia Procedure Notes (Signed)
Procedure Name: MAC Date/Time: 07/08/2020 10:37 AM Performed by: Lowella Dell, CRNA Pre-anesthesia Checklist: Patient identified, Emergency Drugs available, Suction available, Patient being monitored and Timeout performed Patient Re-evaluated:Patient Re-evaluated prior to induction Oxygen Delivery Method: Nasal cannula Placement Confirmation: positive ETCO2 Dental Injury: Teeth and Oropharynx as per pre-operative assessment

## 2020-07-08 NOTE — Transfer of Care (Signed)
Immediate Anesthesia Transfer of Care Note  Patient: Morgan Boyer  Procedure(s) Performed: TRANSESOPHAGEAL ECHOCARDIOGRAM (TEE) (N/A ) BUBBLE STUDY  Patient Location: Endoscopy Unit  Anesthesia Type:MAC  Level of Consciousness: sedated  Airway & Oxygen Therapy: Patient Spontanous Breathing and Patient connected to nasal cannula oxygen  Post-op Assessment: Report given to RN and Post -op Vital signs reviewed and stable  Post vital signs: Reviewed and stable  Last Vitals:  Vitals Value Taken Time  BP 110/73 07/08/20 1135  Temp 36.6 C 07/08/20 1135  Pulse 86 07/08/20 1139  Resp 21 07/08/20 1139  SpO2 100 % 07/08/20 1139  Vitals shown include unvalidated device data.  Last Pain:  Vitals:   07/08/20 1135  TempSrc: Axillary  PainSc: Asleep      Patients Stated Pain Goal: 6 (18/48/59 2763)  Complications: No complications documented.

## 2020-07-08 NOTE — CV Procedure (Signed)
    TRANSESOPHAGEAL ECHOCARDIOGRAM   NAME:  Morgan Boyer   MRN: [id number] DOB:  March 25, 1932   ADMIT DATE: 07/05/2020  INDICATIONS: Severe mitral regurgitation  PROCEDURE:   Informed consent was obtained prior to the procedure. The risks, benefits and alternatives for the procedure were discussed and the patient comprehended these risks.  Risks include, but are not limited to, cough, sore throat, vomiting, nausea, somnolence, esophageal and stomach trauma or perforation, bleeding, low blood pressure, aspiration, pneumonia, infection, trauma to the teeth and death.    Procedural time out performed. Patient received monitored anesthesia care under the supervision of Dr. Valma Cava. Patient received a total of 245 mg propofol during the procedure. She required phenylephrine and and ephedrine to support blood pressure during the case.  The transesophageal probe was inserted in the esophagus and stomach without difficulty and multiple views were obtained.   Dr. Margaretann Loveless assisted with the case.  COMPLICATIONS:    There were no immediate complications.  FINDINGS:  LEFT VENTRICLE: EF = 60-65%. No regional wall motion abnormalities.  RIGHT VENTRICLE: Normal size and function.  Severely elevated pulmonary pressures  LEFT ATRIUM: No thrombus/mass.  LEFT ATRIAL APPENDAGE: No thrombus/mass.   RIGHT ATRIUM: No thrombus/mass.  AORTIC VALVE:  Trileaflet. No regurgitation. No vegetation.  MITRAL VALVE:  No vegetation. Abnormal structure. Valve is degenerative, with appearance of flail P1 to P2 leaflet. Severe eccentric mitral valve regurgitation with systolic pulmonary vein flow reversal.  TRICUSPID VALVE: Normal structure. Moderate to severe regurgitation. No vegetation.  PULMONIC VALVE: Grossly normal structure. Trivial regurgitation. No apparent vegetation.  INTERATRIAL SEPTUM: No PFO or ASD seen by color Doppler. Bubble study negative for intraatrial right to left shut.  PERICARDIUM: Small  effusion noted.  DESCENDING AORTA: Moderate diffuse plaque seen, with moderate to severe plaque in the aortic arch.   CONCLUSION: Severe MR with eccentric jet due to flail P1/P2 leaflets.   Buford Dresser, MD, PhD [facility]  [address] [place], [place] [address] [phone]   11:29 AM

## 2020-07-08 NOTE — Anesthesia Postprocedure Evaluation (Signed)
Anesthesia Post Note  Patient: Morgan Boyer  Procedure(s) Performed: TRANSESOPHAGEAL ECHOCARDIOGRAM (TEE) (N/A ) BUBBLE STUDY     Patient location during evaluation: Endoscopy Anesthesia Type: MAC Level of consciousness: awake and alert Pain management: pain level controlled Vital Signs Assessment: post-procedure vital signs reviewed and stable Respiratory status: spontaneous breathing, nonlabored ventilation, respiratory function stable and patient connected to nasal cannula oxygen Cardiovascular status: blood pressure returned to baseline and stable Postop Assessment: no apparent nausea or vomiting Anesthetic complications: no   No complications documented.  Last Vitals:  Vitals:   07/08/20 1140 07/08/20 1150  BP: 122/60 119/78  Pulse: 86 76  Resp: (!) 21 19  Temp:    SpO2: 100% 99%    Last Pain:  Vitals:   07/08/20 1150  TempSrc:   PainSc: 0-No pain                 Barnet [place]

## 2020-07-08 NOTE — Progress Notes (Signed)
PT Cancellation Note  Patient Details Name: CAMESHIA CRESSMAN MRN: [id number] DOB: 1931-10-20   Cancelled Treatment:    Reason Eval/Treat Not Completed: Patient at procedure or test/unavailable   Mallory Schaad B [place] 07/08/2020, 9:28 AM  Bayard Males, PT Acute Rehabilitation Services Pager: [phone] Office: [phone]

## 2020-07-08 NOTE — Progress Notes (Signed)
[facility] Summary Report / Loop Recorder 

## 2020-07-08 NOTE — Progress Notes (Signed)
  Echocardiogram 2D Echocardiogram has been performed.  Morgan Boyer 07/08/2020, 11:41 AM

## 2020-07-08 NOTE — Plan of Care (Signed)
  Problem: Health Behavior/Discharge Planning: Goal: Ability to manage health-related needs will improve Outcome: Progressing   Problem: Clinical Measurements: Goal: Ability to maintain clinical measurements within normal limits will improve Outcome: Progressing Goal: Will remain free from infection Outcome: Progressing Goal: Diagnostic test results will improve Outcome: Progressing Goal: Respiratory complications will improve Outcome: Progressing Goal: Cardiovascular complication will be avoided Outcome: Progressing   Problem: Coping: Goal: Level of anxiety will decrease Outcome: Progressing   Problem: Elimination: Goal: Will not experience complications related to bowel motility Outcome: Progressing Goal: Will not experience complications related to urinary retention Outcome: Progressing   Problem: Pain Managment: Goal: General experience of comfort will improve Description: General experience of comfort will improve Outcome: Progressing   Problem: Safety: Goal: Ability to remain free from injury will improve Outcome: Progressing   Problem: Skin Integrity: Goal: Risk for impaired skin integrity will decrease Outcome: Progressing

## 2020-07-08 NOTE — Progress Notes (Signed)
Still having chest pain.  CRP elevated at 18.7.  Sed rate elevated at 35.  She does have a pericardial effusion.  I suspect her symptoms could be related to pericarditis.  On review of her EKG she has diffuse minimal ST elevation.  I suspect her symptoms could just be pericarditis related.  We will start ibuprofen 800 mg 3 times daily.  We will start her on colchicine 0.6 twice daily.  I will hold her Eliquis in the setting of acute pericarditis.  This can be restarted at a later date.  We will arrange for her to see Dr. Gerrit Halls in clinic to discuss MitraClip.  PT and OT have been ordered.  Anticipate discharge tomorrow.  [place] T. Audie Box, MD, [place]  [address], [place], [place] [address] [phone]  4:02 PM

## 2020-07-09 DIAGNOSIS — I480 Paroxysmal atrial fibrillation: Secondary | ICD-10-CM | POA: Diagnosis not present

## 2020-07-09 DIAGNOSIS — I340 Nonrheumatic mitral (valve) insufficiency: Secondary | ICD-10-CM | POA: Diagnosis not present

## 2020-07-09 LAB — BASIC METABOLIC PANEL
Anion gap: 10 (ref 5–15)
Anion gap: 8 (ref 5–15)
BUN: 28 mg/dL — ABNORMAL HIGH (ref 8–23)
BUN: 36 mg/dL — ABNORMAL HIGH (ref 8–23)
CO2: 19 mmol/L — ABNORMAL LOW (ref 22–32)
CO2: 22 mmol/L (ref 22–32)
Calcium: 8.7 mg/dL — ABNORMAL LOW (ref 8.9–10.3)
Calcium: 8.7 mg/dL — ABNORMAL LOW (ref 8.9–10.3)
Chloride: 108 mmol/L (ref 98–111)
Chloride: 107 mmol/L (ref 98–111)
Creatinine, Ser: 0.94 mg/dL (ref 0.44–1.00)
Creatinine, Ser: 1.23 mg/dL — ABNORMAL HIGH (ref 0.44–1.00)
GFR, Estimated: 59 mL/min — ABNORMAL LOW (ref >60)
GFR, Estimated: 43 mL/min — ABNORMAL LOW (ref >60)
Glucose, Bld: 144 mg/dL — ABNORMAL HIGH (ref 70–99)
Glucose, Bld: 207 mg/dL — ABNORMAL HIGH (ref 70–99)
Potassium: 5.9 mmol/L — ABNORMAL HIGH (ref 3.5–5.1)
Potassium: 5.2 mmol/L — ABNORMAL HIGH (ref 3.5–5.1)
Sodium: 137 mmol/L (ref 135–145)
Sodium: 137 mmol/L (ref 135–145)

## 2020-07-09 LAB — CBC
HCT: 44.1 % (ref 36.0–46.0)
Hemoglobin: 14.2 g/dL (ref 12.0–15.0)
MCH: 29.3 pg (ref 26.0–34.0)
MCHC: 32.2 g/dL (ref 30.0–36.0)
MCV: 90.9 fL (ref 80.0–100.0)
Platelets: 231 10*3/uL (ref 150–400)
RBC: 4.85 MIL/uL (ref 3.87–5.11)
RDW: 14.5 % (ref 11.5–15.5)
WBC: 13.6 10*3/uL — ABNORMAL HIGH (ref 4.0–10.5)
nRBC: 0 % (ref 0.0–0.2)

## 2020-07-09 MED ORDER — PANTOPRAZOLE SODIUM 40 MG PO TBEC
40.0000 mg | DELAYED_RELEASE_TABLET | Freq: Two times a day (BID) | ORAL | Status: DC
  Administered 2020-07-09 – 2020-07-15 (×13): 40 mg via ORAL
  Filled 2020-07-09 (×12): qty 1

## 2020-07-09 MED ORDER — APIXABAN 5 MG PO TABS
5.0000 mg | ORAL_TABLET | Freq: Two times a day (BID) | ORAL | Status: DC
  Administered 2020-07-09 – 2020-07-11 (×5): 5 mg via ORAL
  Filled 2020-07-09 (×5): qty 1

## 2020-07-09 MED ORDER — IBUPROFEN 100 MG/5ML PO SUSP
800.0000 mg | Freq: Three times a day (TID) | ORAL | Status: DC
  Administered 2020-07-09 – 2020-07-10 (×3): 800 mg via ORAL
  Filled 2020-07-09 (×5): qty 40

## 2020-07-09 MED ORDER — AMIODARONE HCL 200 MG PO TABS
200.0000 mg | ORAL_TABLET | Freq: Two times a day (BID) | ORAL | Status: DC
  Administered 2020-07-09 – 2020-07-12 (×7): 200 mg via ORAL
  Filled 2020-07-09 (×8): qty 1

## 2020-07-09 NOTE — Progress Notes (Signed)
Subjective:  Weak No chest pain this am but nurse indicates had some at 4:30 am   Objective:  Vitals:   07/08/20 2300 07/09/20 0300 07/09/20 0430 07/09/20 0700  BP: 110/89 125/82 (!) 134/93 121/83  Pulse: 91 84 83 84  Resp: 19 (!) 22 20 19   Temp: 97.7 F (36.5 C) 97.7 F (36.5 C)  98 F (36.7 C)  TempSrc: Oral Oral  Oral  SpO2: 95%  96% 96%  Weight:  65.3 kg      Intake/Output from previous day:  Intake/Output Summary (Last 24 hours) at 07/09/2020 0801 Last data filed at 07/08/2020 2200 Gross per 24 hour  Intake 1003 ml  Output --  Net 1003 ml    Physical Exam: Halting speech Lungs clear Loud MR murmur  Abdomen soft No edema PT palpable bilaterally   Lab Results: Basic Metabolic Panel: Recent Labs    07/08/20 0010 07/09/20 0043  NA 137 137  K 4.1 5.9*  CL 102 108  CO2 28 19*  GLUCOSE 141* 144*  BUN 17 28*  CREATININE 0.88 0.94  CALCIUM 8.6* 8.7*   Liver Function Tests: No results for input(s): AST, ALT, ALKPHOS, BILITOT, PROT, ALBUMIN in the last 72 hours. No results for input(s): LIPASE, AMYLASE in the last 72 hours. CBC: Recent Labs    07/08/20 0010 07/09/20 0043  WBC 13.4* 13.6*  HGB 12.2 14.2  HCT 38.3 44.1  MCV 90.1 90.9  PLT 185 231   Cardiac Enzymes: No results for input(s): CKTOTAL, CKMB, CKMBINDEX, TROPONINI in the last 72 hours. BNP: Invalid input(s): POCBNP D-Dimer: No results for input(s): DDIMER in the last 72 hours. Hemoglobin A1C: Recent Labs    07/08/20 0010  HGBA1C 6.2*   Fasting Lipid Panel: No results for input(s): CHOL, HDL, LDLCALC, TRIG, CHOLHDL, LDLDIRECT in the last 72 hours. Thyroid Function Tests: No results for input(s): TSH, T4TOTAL, T3FREE, THYROIDAB in the last 72 hours.  Invalid input(s): FREET3 Anemia Panel: No results for input(s): VITAMINB12, FOLATE, FERRITIN, TIBC, IRON, RETICCTPCT in the last 72 hours.  Imaging: CARDIAC CATHETERIZATION  Result Date: 07/07/2020  Ost LAD to Prox LAD lesion is  40% stenosed.  Severe mitral annular calcification. Mild nonobstructive CAD with 40% smooth proximal LAD stenosis with mild irregularity,and minimal luminal irregularity in the left circumflex and  dominant RCA Moderate right heart pressure elevation with peak V wave in the 36-40 range on the initial PW tracing, consistent with the patient's significant mitral regurgitation. Very mild aortic stenosis. RECOMMENDATION: Patient will be undergoing a TEE tentatively scheduled for tomorrow for further evaluation of her mitral regurgitation.   ECHO TEE  Result Date: 07/08/2020    TRANSESOPHOGEAL ECHO REPORT   Patient Name:   Morgan Boyer Date of Exam: 07/08/2020 Medical Rec #:  [id number]       Height:       62.0 in Accession #:    [id number]      Weight:       142.0 lb Date of Birth:  May 17, 1931        BSA:          1.653 m Patient Age:    87 years        BP:           143/91 mmHg Patient Gender: F               HR:           92 bpm. Exam Location:  Inpatient  Procedure: Transesophageal Echo, Color Doppler, Cardiac Doppler and 3D Echo Indications:     Mitral Regurgitation  History:         Patient has prior history of Echocardiogram examinations, most                  recent 07/06/2020. Arrythmias:Atrial Fibrillation; Risk                  Factors:Dyslipidemia and Hypertension.  Sonographer:     Mikki Santee RDCS (AE) Referring Phys:  [place] Diagnosing Phys: Buford Dresser MD PROCEDURE: After discussion of the risks and benefits of a TEE, an informed consent was obtained from the patient. The transesophogeal probe was passed without difficulty through the esophogus of the patient. Sedation performed by different physician. The patient was monitored while under deep sedation. Anesthestetic sedation was provided intravenously by Anesthesiology: 234.56mg  of Propofol, 60mg  of Lidocaine. Image quality was good. The patient developed no complications during the procedure. IMPRESSIONS  1. Left  ventricular ejection fraction, by estimation, is 60 to 65%. The left ventricle has normal function. The left ventricle has no regional wall motion abnormalities. There is the interventricular septum is flattened in systole and diastole, consistent with right ventricular pressure and volume overload.  2. Right ventricular systolic function is normal. The right ventricular size is normal. There is severely elevated pulmonary artery systolic pressure. The estimated right ventricular systolic pressure is 14.4 mmHg.  3. Left atrial size was severely dilated. No left atrial/left atrial appendage thrombus was detected.  4. Right atrial size was severely dilated.  5. A small pericardial effusion is present.  6. Flail P1/P2 with severe anterior eccentric MR jet.. The mitral valve is degenerative. Severe mitral valve regurgitation. The mean mitral valve gradient is 3.0 mmHg. Moderate mitral annular calcification.  7. Tricuspid valve regurgitation is moderate to severe.  8. The aortic valve is tricuspid. There is mild calcification of the aortic valve. There is mild thickening of the aortic valve. Aortic valve regurgitation is not visualized. No aortic stenosis is present.  9. There is Moderate (Grade III) plaque involving the transverse and descending aorta. Conclusion(s)/Recommendation(s): Severe eccentric MR with flail P1/P2 segments. Moderate to (likely) severe tricuspid regurgitation with severely elevated RVSP. FINDINGS  Left Ventricle: Left ventricular ejection fraction, by estimation, is 60 to 65%. The left ventricle has normal function. The left ventricle has no regional wall motion abnormalities. The left ventricular internal cavity size was normal in size. The interventricular septum is flattened in systole and diastole, consistent with right ventricular pressure and volume overload. Right Ventricle: The right ventricular size is normal. No increase in right ventricular wall thickness. Right ventricular systolic  function is normal. There is severely elevated pulmonary artery systolic pressure. The tricuspid regurgitant velocity is 3.74 m/s, and with an assumed right atrial pressure of 8 mmHg, the estimated right ventricular systolic pressure is 31.5 mmHg. Left Atrium: Left atrial size was severely dilated. No left atrial/left atrial appendage thrombus was detected. Right Atrium: Right atrial size was severely dilated. Pericardium: A small pericardial effusion is present. Mitral Valve: Flail P1/P2 with severe anterior eccentric MR jet. The mitral valve is degenerative in appearance. There is mild thickening of the mitral valve leaflet(s). There is mild calcification of the mitral valve leaflet(s). Moderate mitral annular calcification. Severe mitral valve regurgitation. MV peak gradient, 8.9 mmHg. The mean mitral valve gradient is 3.0 mmHg. Tricuspid Valve: The tricuspid valve is normal in structure. Tricuspid valve regurgitation is moderate to  severe. No evidence of tricuspid stenosis. Aortic Valve: The aortic valve is tricuspid. There is mild calcification of the aortic valve. There is mild thickening of the aortic valve. There is moderate aortic valve annular calcification. Aortic valve regurgitation is not visualized. No aortic stenosis is present. Pulmonic Valve: The pulmonic valve was normal in structure. Pulmonic valve regurgitation is trivial. Aorta: The aortic root and ascending aorta are structurally normal, with no evidence of dilitation. There is moderate (Grade III) plaque involving the transverse and descending aorta. IAS/Shunts: No atrial level shunt detected by color flow Doppler.  AORTIC VALVE LVOT Vmax:   81.90 cm/s LVOT Vmean:  47.100 cm/s LVOT VTI:    0.106 m MITRAL VALVE                 TRICUSPID VALVE MV Peak grad: 8.9 mmHg       TR Peak grad:   56.0 mmHg MV Mean grad: 3.0 mmHg       TR Vmax:        374.00 cm/s MV Vmax:      1.49 m/s MV Vmean:     75.5 cm/s      SHUNTS MR Peak grad:    75.0 mmHg    Systemic VTI: 0.11 m MR Mean grad:    49.0 mmHg MR Vmax:         433.00 cm/s MR Vmean:        334.0 cm/s MR PISA:         6.28 cm MR PISA Eff ROA: 56 mm MR PISA Radius:  1.00 cm Buford Dresser MD Electronically signed by Buford Dresser MD Signature Date/Time: 07/08/2020/6:24:57 PM    Final     Cardiac Studies:  ECG:    Telemetry:  afib rates 90 bpm   Echo: /TEE4/8/22 severe MR with flail P1P2   Medications:   . aspirin  81 mg Oral Daily  . B-complex with vitamin C  1 tablet Oral Daily  . colchicine  0.6 mg Oral BID   Followed by  . [START ON 07/10/2020] colchicine  0.6 mg Oral Daily  . gabapentin  300 mg Oral QHS  . ibuprofen  800 mg Oral Q8H  . lisinopril  20 mg Oral BID  . metoprolol tartrate  25 mg Oral BID  . pantoprazole  40 mg Oral Daily  . potassium chloride  40 mEq Oral BID  . simvastatin  20 mg Oral QPM  . sodium chloride flush  3 mL Intravenous Q12H  . sodium chloride flush  3 mL Intravenous Q12H     . sodium chloride 10 mL/hr at 07/08/20 0943  . 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 She needs to get OOB and ambulate with her walker EF is Normal 60-65% Chest pain is non anginal with small effusion ? Pericarditis started on colchicine and motrin By Dr Marisue Ivan yesterday PAF needs oral amiodarone started resume eliquis   Jenkins Rouge 07/09/2020, 8:01 AM

## 2020-07-09 NOTE — Progress Notes (Signed)
ANTICOAGULATION CONSULT NOTE - Initial Consult  Pharmacy Consult for Eliquis Indication: atrial fibrillation  Allergies  Allergen Reactions  . Septra [Sulfamethoxazole-Trimethoprim] Hives  . Lotensin [Benazepril Hcl] Other (See Comments)    NOT KNOWN    Patient Measurements: Weight: 65.3 kg (143 lb 15.4 oz) Heparin Dosing Weight: 65.3 kg  Vital Signs: Temp: 98 F (36.7 C) (04/09 0700) Temp Source: Oral (04/09 0700) BP: 121/83 (04/09 0700) Pulse Rate: 84 (04/09 0700)  Labs: Recent Labs    07/06/20 1037 07/06/20 2045 07/07/20 0054 07/07/20 0652 07/07/20 1347 07/07/20 1406 07/08/20 0010 07/09/20 0043  HGB  --   --  12.2  --    < > 11.6* 12.2 14.2  HCT  --   --  37.2  --    < > 34.0* 38.3 44.1  PLT  --   --  173  --   --   --  185 231  APTT  --  97* 75*  --   --   --  44*  --   HEPARINUNFRC  --   --  1.58*  --   --   --   --   --   CREATININE  --   --   --  0.77  --   --  0.88 0.94  TROPONINIHS 16  --   --   --   --   --   --   --    < > = values in this interval not displayed.    Estimated Creatinine Clearance: 38.1 mL/min (by C-G formula based on SCr of 0.94 mg/dL).   Assessment: 87 yo female on chronic Eliquis for afib.  Has been held during admission for consideration of cardiac procedures.  Pharmacy asked to resume Eliquis today.  Pt is > 1 yo, but weight > 60 kg and Scr okay.  CBC WNL.  Also adding scheduled ibuprofen x 5 days for pericarditis.  Goal of Therapy:  Monitor platelets by anticoagulation protocol: Yes   Plan:  1. Resume Eliquis 5 mg BID. 2. Discussed with Dr. Johnsie Cancel - will increase Protonix to BID while on scheduled ibuprofen.  [place] Crane, Roylene Reason, BCCP Clinical Pharmacist  07/09/2020 10:03 AM   [facility] pharmacy phone numbers are listed on [email or web address]

## 2020-07-09 NOTE — Evaluation (Signed)
Physical Therapy Evaluation Patient Details Name: Morgan Boyer MRN: [id number] DOB: 08-28-1931 Today's Date: 07/09/2020   History of Present Illness  87 yo admitted 4/5 wtih chest pain and LE edema with [place]. Pt s/p radial heart cath 4/7. PMhx: severe mitral regurgitation, HFpEF, HTN, memory impairment, CVA, HLD, GERD, Afib  Clinical Impression  Pt presents to PT with deficits in functional mobility, gait, balance, endurance, strength, power, cognition. Pt with significant deficits in sitting balance, often with R lateral lean and needing physical assistance or UE support to maintain balance. This results in an inability to perform ADLs like lower body dressing which the pt typically performs independently. Pt demonstrates impaired device management when ambulating and has a R lateral lean which requires PT assist to reduce falls risk. Pt's son reports pt has gait deviations at baseline, however these are more significant currently as the pt is able to ambulate independently with use of 3 wheeled walker in the home. Pt will benefit from aggressive mobilization to improve strength and balance as she has been in bed for 4 days prior to this evaluation. PT recommends discharge home with HHPT and a 3in1 commode after further acute PT therapy. The pt will benefit from assistance from family for all out of bed mobility initially.    Follow Up Recommendations Home health PT;Supervision for mobility/OOB    Equipment Recommendations  3in1 (PT) (pt may benefit from RW pending progress with stability when utilizing Rollator)    Recommendations for Other Services       Precautions / Restrictions Precautions Precautions: Fall Restrictions Weight Bearing Restrictions: No      Mobility  Bed Mobility Overal bed mobility: Needs Assistance Bed Mobility: Supine to Sit     Supine to sit: Min guard;HOB elevated     General bed mobility comments: useof railings    Transfers Overall transfer level:  Needs assistance Equipment used: 4-wheeled walker Transfers: Sit to/from Stand Sit to Stand: Min assist         General transfer comment: posterior lean, cues required for brake management of walker. PT provides tactile cues to facilitate anterior lean to correct posterior preference  Ambulation/Gait Ambulation/Gait assistance: Min assist Gait Distance (Feet): 60 Feet Assistive device: 4-wheeled walker Gait Pattern/deviations: Step-to pattern Gait velocity: reduced Gait velocity interpretation: <1.31 ft/sec, indicative of household ambulator General Gait Details: pt with slowed step-to gait, pt with tendency to hold walker off to left side of body rather than maintaining walker directly anterior to BOS, pt is unable to correct despite multiple PT verbal and tactile cues. Pt with R lateral lean at times requiring PT physical assistance to correct.  Stairs            Wheelchair Mobility    Modified Rankin (Stroke Patients Only)       Balance Overall balance assessment: Needs assistance Sitting-balance support: Single extremity supported;Feet supported Sitting balance-Leahy Scale: Poor Sitting balance - Comments: R posterior lean, reliant on UE support Postural control: Posterior lean;Right lateral lean Standing balance support: Bilateral upper extremity supported Standing balance-Leahy Scale: Poor Standing balance comment: posterior and R lateral lean                             Pertinent Vitals/Pain Pain Assessment: No/denies pain    Home Living Family/patient expects to be discharged to:: Private residence Living Arrangements: Alone Available Help at Discharge: Family;Available PRN/intermittently (near 24/7 initially per son) Type of Home: House Home Access:  Stairs to enter Entrance Stairs-Rails: Right Entrance Stairs-Number of Steps: 2 Home Layout: One level Home Equipment: Shower seat;Grab bars - toilet;Grab bars - tub/shower;Hand held shower head  (3 wheeled walker)      Prior Function Level of Independence: Independent with assistive device(s)         Comments: pt reports ambulating with 3 wheeled walker in the home     Hand Dominance   Dominant Hand: Right    Extremity/Trunk Assessment   Upper Extremity Assessment Upper Extremity Assessment: Generalized weakness    Lower Extremity Assessment Lower Extremity Assessment: Generalized weakness    Cervical / Trunk Assessment Cervical / Trunk Assessment: Kyphotic  Communication   Communication: HOH  Cognition Arousal/Alertness: Awake/alert Behavior During Therapy: WFL for tasks assessed/performed Overall Cognitive Status: Impaired/Different from baseline Area of Impairment: Orientation;Attention;Memory;Following commands;Safety/judgement;Awareness;Problem solving                 Orientation Level: Disoriented to;Time Current Attention Level: Sustained Memory: Decreased short-term memory Following Commands: Follows one step commands consistently;Follows multi-step commands with increased time Safety/Judgement: Decreased awareness of safety;Decreased awareness of deficits Awareness: Intellectual Problem Solving: Slow processing;Requires verbal cues;Requires tactile cues        General Comments General comments (skin integrity, edema, etc.): VSS on RA, BP of 139/77 (93)    Exercises     Assessment/Plan    PT Assessment Patient needs continued PT services  PT Problem List Decreased strength;Decreased activity tolerance;Decreased balance;Decreased mobility;Decreased cognition;Decreased knowledge of use of DME;Decreased safety awareness;Decreased knowledge of precautions       PT Treatment Interventions DME instruction;Gait training;Stair training;Functional mobility training;Therapeutic activities;Therapeutic exercise;Balance training;Neuromuscular re-education;Cognitive remediation;Patient/family education    PT Goals (Current goals can be found in  the Care Plan section)  Acute Rehab PT Goals Patient Stated Goal: to improve strength and mobility quality and go home PT Goal Formulation: With patient/family Time For Goal Achievement: 07/23/20 Potential to Achieve Goals: Good    Frequency Min 3X/week   Barriers to discharge        Co-evaluation               AM-PAC PT "6 Clicks" Mobility  Outcome Measure Help needed turning from your back to your side while in a flat bed without using bedrails?: A Little Help needed moving from lying on your back to sitting on the side of a flat bed without using bedrails?: A Little Help needed moving to and from a bed to a chair (including a wheelchair)?: A Little Help needed standing up from a chair using your arms (e.g., wheelchair or bedside chair)?: A Little Help needed to walk in hospital room?: A Little Help needed climbing 3-5 steps with a railing? : A Lot 6 Click Score: 17    End of Session Equipment Utilized During Treatment: Gait belt Activity Tolerance: Patient limited by fatigue Patient left: in chair;with call bell/phone within reach;with chair alarm set;with family/visitor present Nurse Communication: Mobility status PT Visit Diagnosis: Unsteadiness on feet (R26.81);Other abnormalities of gait and mobility (R26.89);Muscle weakness (generalized) (M62.81)    Time: 7169-6789 PT Time Calculation (min) (ACUTE ONLY): 37 min   Charges:   PT Evaluation $PT Eval Moderate Complexity: 1 Mod PT Treatments $Therapeutic Activity: 8-22 mins        Zenaida Niece, PT, DPT Acute Rehabilitation Pager: [phone]   Zenaida Niece 07/09/2020, 12:23 PM

## 2020-07-09 NOTE — Progress Notes (Signed)
Pt c/o chest pain 7/10. EKG performed, ibuprofen and tramadol given. Notified charge nurse, charge nurse contacted MD, awaiting a call back.

## 2020-07-10 DIAGNOSIS — I480 Paroxysmal atrial fibrillation: Secondary | ICD-10-CM | POA: Diagnosis not present

## 2020-07-10 DIAGNOSIS — I340 Nonrheumatic mitral (valve) insufficiency: Secondary | ICD-10-CM | POA: Diagnosis not present

## 2020-07-10 LAB — BASIC METABOLIC PANEL
Anion gap: 6 (ref 5–15)
Anion gap: 10 (ref 5–15)
BUN: 43 mg/dL — ABNORMAL HIGH (ref 8–23)
BUN: 52 mg/dL — ABNORMAL HIGH (ref 8–23)
CO2: 22 mmol/L (ref 22–32)
CO2: 20 mmol/L — ABNORMAL LOW (ref 22–32)
Calcium: 8.4 mg/dL — ABNORMAL LOW (ref 8.9–10.3)
Calcium: 8.5 mg/dL — ABNORMAL LOW (ref 8.9–10.3)
Chloride: 107 mmol/L (ref 98–111)
Chloride: 106 mmol/L (ref 98–111)
Creatinine, Ser: 1.42 mg/dL — ABNORMAL HIGH (ref 0.44–1.00)
Creatinine, Ser: 1.79 mg/dL — ABNORMAL HIGH (ref 0.44–1.00)
GFR, Estimated: 36 mL/min — ABNORMAL LOW (ref >60)
GFR, Estimated: 27 mL/min — ABNORMAL LOW (ref >60)
Glucose, Bld: 162 mg/dL — ABNORMAL HIGH (ref 70–99)
Glucose, Bld: 141 mg/dL — ABNORMAL HIGH (ref 70–99)
Potassium: 5 mmol/L (ref 3.5–5.1)
Potassium: 5.4 mmol/L — ABNORMAL HIGH (ref 3.5–5.1)
Sodium: 135 mmol/L (ref 135–145)
Sodium: 136 mmol/L (ref 135–145)

## 2020-07-10 NOTE — Progress Notes (Signed)
[place] for Eliquis Indication: atrial fibrillation  Allergies  Allergen Reactions  . Septra [Sulfamethoxazole-Trimethoprim] Hives  . Lotensin [Benazepril Hcl] Other (See Comments)    NOT KNOWN    Patient Measurements: Weight: 65.3 kg (143 lb 15.4 oz) Heparin Dosing Weight: 65.3 kg  Vital Signs: Temp: 97.7 F (36.5 C) (04/10 0738) Temp Source: Axillary (04/10 0738) BP: 100/85 (04/10 1321) Pulse Rate: 72 (04/10 1321)  Labs: Recent Labs    07/08/20 0010 07/09/20 0043 07/09/20 1021 07/10/20 0030 07/10/20 1053  HGB 12.2 14.2  --   --   --   HCT 38.3 44.1  --   --   --   PLT 185 231  --   --   --   APTT 44*  --   --   --   --   CREATININE 0.88 0.94 1.23* 1.42* 1.79*    Estimated Creatinine Clearance: 20 mL/min (A) (by C-G formula based on SCr of 1.79 mg/dL (H)).   Assessment: 87 yo female on chronic Eliquis for afib.  Has been held during admission for consideration of cardiac procedures.  Pharmacy asked to resume Eliquis today.  Pt is > 61 yo, but weight > 60 kg and Scr okay.  CBC WNL.  Also on scheduled ibuprofen x 5 days for pericarditis.  Rising Scr today.  Goal of Therapy:  Monitor platelets by anticoagulation protocol: Yes   Plan:  1. Reduce Eliquis to 2.5 mg BID since age > 54 and now with Scr > 1.5 2. May need to consider holding ibuprofen with rising Scr?  [place] Crane, Roylene Reason, BCCP Clinical Pharmacist  07/10/2020 3:08 PM   [facility] pharmacy phone numbers are listed on [email or web address]

## 2020-07-10 NOTE — Plan of Care (Signed)
  Problem: Health Behavior/Discharge Planning: Goal: Ability to manage health-related needs will improve Outcome: Progressing   Problem: Clinical Measurements: Goal: Ability to maintain clinical measurements within normal limits will improve Outcome: Progressing Goal: Respiratory complications will improve Outcome: Progressing   Problem: Elimination: Goal: Will not experience complications related to urinary retention Outcome: Progressing   Problem: Pain Managment: Goal: General experience of comfort will improve Description: General experience of comfort will improve Outcome: Progressing

## 2020-07-10 NOTE — Progress Notes (Signed)
Subjective:  Weak No chest pain this am but nurse indicates had some at 4:30 am   Objective:  Vitals:   07/09/20 2000 07/09/20 2100 07/10/20 0300 07/10/20 0738  BP: (!) 122/93 117/82 98/82 98/77   Pulse: 79 84 72 73  Resp: (!) 22 17 17 16   Temp: 97.7 F (36.5 C)  97.7 F (36.5 C) 97.7 F (36.5 C)  TempSrc: Oral  Oral Axillary  SpO2: 96% 96% 98% 96%  Weight:   65.3 kg     Intake/Output from previous day:  Intake/Output Summary (Last 24 hours) at 07/10/2020 0808 Last data filed at 07/09/2020 1600 Gross per 24 hour  Intake 1123.35 ml  Output --  Net 1123.35 ml    Physical Exam: Halting speech Lungs clear Loud MR murmur  Abdomen soft No edema PT palpable bilaterally   Lab Results: Basic Metabolic Panel: Recent Labs    07/09/20 1021 07/10/20 0030  NA 137 135  K 5.2* 5.0  CL 107 107  CO2 22 22  GLUCOSE 207* 162*  BUN 36* 43*  CREATININE 1.23* 1.42*  CALCIUM 8.7* 8.4*   Liver Function Tests: No results for input(s): AST, ALT, ALKPHOS, BILITOT, PROT, ALBUMIN in the last 72 hours. No results for input(s): LIPASE, AMYLASE in the last 72 hours. CBC: Recent Labs    07/08/20 0010 07/09/20 0043  WBC 13.4* 13.6*  HGB 12.2 14.2  HCT 38.3 44.1  MCV 90.1 90.9  PLT 185 231   Cardiac Enzymes: No results for input(s): CKTOTAL, CKMB, CKMBINDEX, TROPONINI in the last 72 hours. BNP: Invalid input(s): POCBNP D-Dimer: No results for input(s): DDIMER in the last 72 hours. Hemoglobin A1C: Recent Labs    07/08/20 0010  HGBA1C 6.2*   Fasting Lipid Panel: No results for input(s): CHOL, HDL, LDLCALC, TRIG, CHOLHDL, LDLDIRECT in the last 72 hours. Thyroid Function Tests: No results for input(s): TSH, T4TOTAL, T3FREE, THYROIDAB in the last 72 hours.  Invalid input(s): FREET3 Anemia Panel: No results for input(s): VITAMINB12, FOLATE, FERRITIN, TIBC, IRON, RETICCTPCT in the last 72 hours.  Imaging: ECHO TEE  Result Date: 07/08/2020    TRANSESOPHOGEAL ECHO REPORT    Patient Name:   Morgan BETTENHAUSEN Date of Exam: 07/08/2020 Medical Rec #:  [id number]       Height:       62.0 in Accession #:    [id number]      Weight:       142.0 lb Date of Birth:  [birth year]        BSA:          1.653 m Patient Age:    87 years        BP:           143/91 mmHg Patient Gender: F               HR:           92 bpm. Exam Location:  Inpatient Procedure: Transesophageal Echo, Color Doppler, Cardiac Doppler and 3D Echo Indications:     Mitral Regurgitation  History:         Patient has prior history of Echocardiogram examinations, most                  recent 07/06/2020. Arrythmias:Atrial Fibrillation; Risk                  Factors:Dyslipidemia and Hypertension.  Sonographer:     Mikki Santee RDCS (AE) Referring Phys:  [id number] JILL D  MCDANIEL Diagnosing Phys: Buford Dresser MD PROCEDURE: After discussion of the risks and benefits of a TEE, an informed consent was obtained from the patient. The transesophogeal probe was passed without difficulty through the esophogus of the patient. Sedation performed by different physician. The patient was monitored while under deep sedation. Anesthestetic sedation was provided intravenously by Anesthesiology: 234.56mg  of Propofol, 60mg  of Lidocaine. Image quality was good. The patient developed no complications during the procedure. IMPRESSIONS  1. Left ventricular ejection fraction, by estimation, is 60 to 65%. The left ventricle has normal function. The left ventricle has no regional wall motion abnormalities. There is the interventricular septum is flattened in systole and diastole, consistent with right ventricular pressure and volume overload.  2. Right ventricular systolic function is normal. The right ventricular size is normal. There is severely elevated pulmonary artery systolic pressure. The estimated right ventricular systolic pressure is 60.1 mmHg.  3. Left atrial size was severely dilated. No left atrial/left atrial appendage thrombus was detected.   4. Right atrial size was severely dilated.  5. A small pericardial effusion is present.  6. Flail P1/P2 with severe anterior eccentric MR jet.. The mitral valve is degenerative. Severe mitral valve regurgitation. The mean mitral valve gradient is 3.0 mmHg. Moderate mitral annular calcification.  7. Tricuspid valve regurgitation is moderate to severe.  8. The aortic valve is tricuspid. There is mild calcification of the aortic valve. There is mild thickening of the aortic valve. Aortic valve regurgitation is not visualized. No aortic stenosis is present.  9. There is Moderate (Grade III) plaque involving the transverse and descending aorta. Conclusion(s)/Recommendation(s): Severe eccentric MR with flail P1/P2 segments. Moderate to (likely) severe tricuspid regurgitation with severely elevated RVSP. FINDINGS  Left Ventricle: Left ventricular ejection fraction, by estimation, is 60 to 65%. The left ventricle has normal function. The left ventricle has no regional wall motion abnormalities. The left ventricular internal cavity size was normal in size. The interventricular septum is flattened in systole and diastole, consistent with right ventricular pressure and volume overload. Right Ventricle: The right ventricular size is normal. No increase in right ventricular wall thickness. Right ventricular systolic function is normal. There is severely elevated pulmonary artery systolic pressure. The tricuspid regurgitant velocity is 3.74 m/s, and with an assumed right atrial pressure of 8 mmHg, the estimated right ventricular systolic pressure is 09.3 mmHg. Left Atrium: Left atrial size was severely dilated. No left atrial/left atrial appendage thrombus was detected. Right Atrium: Right atrial size was severely dilated. Pericardium: A small pericardial effusion is present. Mitral Valve: Flail P1/P2 with severe anterior eccentric MR jet. The mitral valve is degenerative in appearance. There is mild thickening of the mitral  valve leaflet(s). There is mild calcification of the mitral valve leaflet(s). Moderate mitral annular calcification. Severe mitral valve regurgitation. MV peak gradient, 8.9 mmHg. The mean mitral valve gradient is 3.0 mmHg. Tricuspid Valve: The tricuspid valve is normal in structure. Tricuspid valve regurgitation is moderate to severe. No evidence of tricuspid stenosis. Aortic Valve: The aortic valve is tricuspid. There is mild calcification of the aortic valve. There is mild thickening of the aortic valve. There is moderate aortic valve annular calcification. Aortic valve regurgitation is not visualized. No aortic stenosis is present. Pulmonic Valve: The pulmonic valve was normal in structure. Pulmonic valve regurgitation is trivial. Aorta: The aortic root and ascending aorta are structurally normal, with no evidence of dilitation. There is moderate (Grade III) plaque involving the transverse and descending aorta. IAS/Shunts: No atrial level shunt  detected by color flow Doppler.  AORTIC VALVE LVOT Vmax:   81.90 cm/s LVOT Vmean:  47.100 cm/s LVOT VTI:    0.106 m MITRAL VALVE                 TRICUSPID VALVE MV Peak grad: 8.9 mmHg       TR Peak grad:   56.0 mmHg MV Mean grad: 3.0 mmHg       TR Vmax:        374.00 cm/s MV Vmax:      1.49 m/s MV Vmean:     75.5 cm/s      SHUNTS MR Peak grad:    75.0 mmHg   Systemic VTI: 0.11 m MR Mean grad:    49.0 mmHg MR Vmax:         433.00 cm/s MR Vmean:        334.0 cm/s MR PISA:         6.28 cm MR PISA Eff ROA: 56 mm MR PISA Radius:  1.00 cm Buford Dresser MD Electronically signed by Buford Dresser MD Signature Date/Time: 07/08/2020/6:24:57 PM    Final     Cardiac Studies:  ECG:    Telemetry:  afib rates 90 bpm   Echo: /TEE4/8/22 severe MR with flail P1P2   Medications:   . amiodarone  200 mg Oral BID  . apixaban  5 mg Oral BID  . B-complex with vitamin C  1 tablet Oral Daily  . colchicine  0.6 mg Oral Daily  . gabapentin  300 mg Oral QHS  . ibuprofen   800 mg Oral Q8H  . lisinopril  20 mg Oral BID  . metoprolol tartrate  25 mg Oral BID  . pantoprazole  40 mg Oral BID AC  . simvastatin  20 mg Oral QPM  . sodium chloride flush  3 mL Intravenous Q12H  . sodium chloride flush  3 mL Intravenous Q12H     . sodium chloride 10 mL/hr at 07/08/20 0943  . 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 She needs to get OOB and ambulate with her walker EF is Normal 60-65% Chest pain is non anginal with small effusion ? Pericarditis started on colchicine and motrin By Dr Marisue Ivan yesterday PAF back on Eliquis  She if frail and functionally limited not clear to me that she is a good candidate She lives alone and was A two person assist to get OOB and ambulate yesterday Will get PT to see   BP down and more azotemic will hold ACE today and follow  Jenkins Rouge 07/10/2020, 8:08 AM

## 2020-07-10 NOTE — Progress Notes (Signed)
Physical Therapy Treatment Patient Details Name: Morgan Boyer MRN: [id number] DOB: 06/18/1931 Today's Date: 07/10/2020    History of Present Illness 87 yo admitted 4/5 wtih chest pain and LE edema with [place]. Pt s/p radial heart cath 4/7. PMhx: severe mitral regurgitation, HFpEF, HTN, memory impairment, CVA, HLD, GERD, Afib    PT Comments    Pt with some regression noted this session, requiring increased assistance to transfer and is unable to ambulate out of room due to LE weakness. Pt continues to demonstrate core/trunk weakness, having great difficulty maintaining sitting balance at the edge of bed. Pt also continues to demonstrate LE power deficits which limit her ability to transfer without assistance. Pt's son present for session and expresses concern about managing pt's mobility safely in the home at her current functional level. PT updates recommendations to SNF at this time.   Follow Up Recommendations  SNF     Equipment Recommendations  3in1 (PT);Wheelchair (measurements PT)    Recommendations for Other Services       Precautions / Restrictions Precautions Precautions: Fall Restrictions Weight Bearing Restrictions: No    Mobility  Bed Mobility Overal bed mobility: Needs Assistance Bed Mobility: Supine to Sit     Supine to sit: Min assist;HOB elevated     General bed mobility comments: use of railings, posterior lean    Transfers Overall transfer level: Needs assistance Equipment used: 4-wheeled walker Transfers: Sit to/from [facility] Sit to Stand: Min assist Stand pivot transfers: Min assist       General transfer comment: pt continues to demonstrate posterior lean, requires verbal cues to extend knees/hips and to increase trunk flexion initially to improve technique.  Ambulation/Gait Ambulation/Gait assistance: Min Web designer (Feet): 3 Feet Assistive device: 4-wheeled walker Gait Pattern/deviations: Shuffle Gait velocity:  reduced Gait velocity interpretation: <1.31 ft/sec, indicative of household ambulator General Gait Details: pt with reduced foot clearance when attempting to advance RW forward, requires cues to increase foot clearance when marching but is unable to translate to gait at this time   Chief Strategy Officer    Modified Rankin (Stroke Patients Only)       Balance Overall balance assessment: Needs assistance Sitting-balance support: Single extremity supported;Feet supported Sitting balance-Leahy Scale: Poor Sitting balance - Comments: posterior and L lean Postural control: Posterior lean;Left lateral lean Standing balance support: Bilateral upper extremity supported Standing balance-Leahy Scale: Poor Standing balance comment: minG-min A with BUE support of RW                            Cognition Arousal/Alertness: Awake/alert Behavior During Therapy: Flat affect Overall Cognitive Status: Impaired/Different from baseline Area of Impairment: Memory;Following commands;Safety/judgement;Awareness;Problem solving                     Memory: Decreased recall of precautions;Decreased short-term memory Following Commands: Follows one step commands with increased time Safety/Judgement: Decreased awareness of safety;Decreased awareness of deficits Awareness: Intellectual Problem Solving: Slow processing;Requires verbal cues;Requires tactile cues        Exercises      General Comments General comments (skin integrity, edema, etc.): VSS on RA      Pertinent Vitals/Pain Pain Assessment: No/denies pain    Home Living                      Prior Function  PT Goals (current goals can now be found in the care plan section) Acute Rehab PT Goals Patient Stated Goal: to go to SNF to improve strength while having assistance available to improve safety Progress towards PT goals: Not progressing toward goals - comment (limited  by fatigue and weakness)    Frequency    Min 2X/week      PT Plan Discharge plan needs to be updated    Co-evaluation              AM-PAC PT "6 Clicks" Mobility   Outcome Measure  Help needed turning from your back to your side while in a flat bed without using bedrails?: A Little Help needed moving from lying on your back to sitting on the side of a flat bed without using bedrails?: A Little Help needed moving to and from a bed to a chair (including a wheelchair)?: A Little Help needed standing up from a chair using your arms (e.g., wheelchair or bedside chair)?: A Little Help needed to walk in hospital room?: A Little Help needed climbing 3-5 steps with a railing? : A Lot 6 Click Score: 17    End of Session   Activity Tolerance: Patient limited by fatigue Patient left: in chair;with call bell/phone within reach;with family/visitor present Nurse Communication: Mobility status PT Visit Diagnosis: Unsteadiness on feet (R26.81);Other abnormalities of gait and mobility (R26.89);Muscle weakness (generalized) (M62.81)     Time: 1224-1300 PT Time Calculation (min) (ACUTE ONLY): 36 min  Charges:  $Therapeutic Activity: 23-37 mins                     Zenaida Niece, PT, DPT Acute Rehabilitation Pager: [phone]    Zenaida Niece 07/10/2020, 1:26 PM

## 2020-07-11 ENCOUNTER — Encounter (HOSPITAL_COMMUNITY): Payer: Self-pay | Admitting: Cardiology

## 2020-07-11 DIAGNOSIS — I480 Paroxysmal atrial fibrillation: Secondary | ICD-10-CM | POA: Diagnosis not present

## 2020-07-11 DIAGNOSIS — I340 Nonrheumatic mitral (valve) insufficiency: Secondary | ICD-10-CM | POA: Diagnosis not present

## 2020-07-11 DIAGNOSIS — R079 Chest pain, unspecified: Secondary | ICD-10-CM | POA: Diagnosis not present

## 2020-07-11 DIAGNOSIS — I10 Essential (primary) hypertension: Secondary | ICD-10-CM | POA: Diagnosis not present

## 2020-07-11 LAB — BASIC METABOLIC PANEL
Anion gap: 11 (ref 5–15)
BUN: 62 mg/dL — ABNORMAL HIGH (ref 8–23)
CO2: 20 mmol/L — ABNORMAL LOW (ref 22–32)
Calcium: 8.3 mg/dL — ABNORMAL LOW (ref 8.9–10.3)
Chloride: 103 mmol/L (ref 98–111)
Creatinine, Ser: 2.33 mg/dL — ABNORMAL HIGH (ref 0.44–1.00)
GFR, Estimated: 20 mL/min — ABNORMAL LOW (ref >60)
Glucose, Bld: 122 mg/dL — ABNORMAL HIGH (ref 70–99)
Potassium: 5.6 mmol/L — ABNORMAL HIGH (ref 3.5–5.1)
Sodium: 134 mmol/L — ABNORMAL LOW (ref 135–145)

## 2020-07-11 MED ORDER — APIXABAN 2.5 MG PO TABS
2.5000 mg | ORAL_TABLET | Freq: Two times a day (BID) | ORAL | Status: DC
  Administered 2020-07-11 – 2020-07-15 (×8): 2.5 mg via ORAL
  Filled 2020-07-11 (×8): qty 1

## 2020-07-11 MED ORDER — LOPERAMIDE HCL 2 MG PO CAPS
2.0000 mg | ORAL_CAPSULE | ORAL | Status: DC | PRN
  Administered 2020-07-11: 2 mg via ORAL
  Filled 2020-07-11: qty 1

## 2020-07-11 NOTE — Evaluation (Signed)
Occupational Therapy Evaluation Patient Details Name: Morgan Boyer MRN: [id number] DOB: 1931-08-03 Today's Date: 07/11/2020    History of Present Illness 87 yo admitted 4/5 wtih chest pain and LE edema with [place]. Pt s/p radial heart cath 4/7. PMhx: severe mitral regurgitation, HFpEF, HTN, memory impairment, CVA, HLD, GERD, Afib   Clinical Impression   Pt was ambulating with a RW and modified independent in self care and light meal prep. She had assistance for housekeeping. Pt presents with generalized weakness, decreased activity tolerance and impaired standing balance. She is somewhat slow to respond to questions and demonstrates memory deficits, likely baseline. Pt requires up to min assist for ADL and mobility. Recommending ST rehab upon discharge. Will follow acutely.     Follow Up Recommendations  SNF;Supervision/Assistance - 24 hour    Equipment Recommendations  None recommended by OT    Recommendations for Other Services       Precautions / Restrictions Precautions Precautions: Fall      Mobility Bed Mobility               General bed mobility comments: in chair    Transfers Overall transfer level: Needs assistance Equipment used: Rolling walker (2 wheeled) Transfers: Sit to/from Stand Sit to Stand: Min guard         General transfer comment: slow to rise    Balance Overall balance assessment: Needs assistance   Sitting balance-Leahy Scale: Good Sitting balance - Comments: no LOB donning sock   Standing balance support: Bilateral upper extremity supported Standing balance-Leahy Scale: Poor Standing balance comment: reliant on RW                           ADL either performed or assessed with clinical judgement   ADL Overall ADL's : Needs assistance/impaired Eating/Feeding: Independent   Grooming: Min guard;Standing   Upper Body Bathing: Minimal assistance;Sitting   Lower Body Bathing: Minimal assistance;Sit to/from stand    Upper Body Dressing : Set up;Sitting   Lower Body Dressing: Minimal assistance;Sit to/from stand   Toilet Transfer: Minimal assistance;Ambulation;RW   Toileting- Clothing Manipulation and Hygiene: Minimal assistance;Sit to/from stand       Functional mobility during ADLs: Minimal assistance;Rolling walker       Vision Baseline Vision/History: Wears glasses Wears Glasses: Reading only Patient Visual Report: No change from baseline       Perception     Praxis      Pertinent Vitals/Pain Pain Assessment: No/denies pain     Hand Dominance Right   Extremity/Trunk Assessment Upper Extremity Assessment Upper Extremity Assessment: Overall WFL for tasks assessed   Lower Extremity Assessment Lower Extremity Assessment: Defer to PT evaluation   Cervical / Trunk Assessment Cervical / Trunk Assessment: Kyphotic (history of back sx)   Communication Communication Communication: HOH   Cognition Arousal/Alertness: Awake/alert Behavior During Therapy: WFL for tasks assessed/performed Overall Cognitive Status: Impaired/Different from baseline  Decreased memory                         Following Commands: Follows one step commands with increased time     Problem Solving: Slow processing;Requires verbal cues     General Comments       Exercises     Shoulder Instructions      Home Living Family/patient expects to be discharged to:: Private residence Living Arrangements: Alone Available Help at Discharge: Family;Available PRN/intermittently Type of Home: House Home Access: Stairs  to enter Entrance Stairs-Number of Steps: 2 Entrance Stairs-Rails: Right Home Layout: One level     Bathroom Shower/Tub: Occupational psychologist: Handicapped height     Home Equipment: Shower seat;Grab bars - toilet;Grab bars - tub/shower;Hand held shower head (3 wheeled walker)          Prior Functioning/Environment Level of Independence: Needs assistance   Gait / Transfers Assistance Needed: walks with RW ADL's / Homemaking Assistance Needed: assisted for housekeeping, prepares simple meals, sits to shower            OT Problem List: Decreased strength;Decreased activity tolerance;Impaired balance (sitting and/or standing);Decreased knowledge of use of DME or AE;Decreased cognition      OT Treatment/Interventions: Self-care/ADL training;DME and/or AE instruction;Patient/family education;Balance training;Therapeutic activities    OT Goals(Current goals can be found in the care plan section) Acute Rehab OT Goals Patient Stated Goal: to go to SNF to improve strength while having assistance available to improve safety OT Goal Formulation: With patient Time For Goal Achievement: 07/25/20 Potential to Achieve Goals: Good ADL Goals Pt Will Perform Grooming: with supervision;standing Pt Will Perform Lower Body Bathing: with supervision;sit to/from stand Pt Will Perform Lower Body Dressing: with supervision;sit to/from stand Pt Will Transfer to Toilet: with supervision;ambulating;bedside commode Pt Will Perform Toileting - Clothing Manipulation and hygiene: with supervision;sit to/from stand  OT Frequency: Min 2X/week   Barriers to D/C:            Co-evaluation              AM-PAC OT "6 Clicks" Daily Activity     Outcome Measure Help from another person eating meals?: None Help from another person taking care of personal grooming?: A Little Help from another person toileting, which includes using toliet, bedpan, or urinal?: A Little Help from another person bathing (including washing, rinsing, drying)?: A Little Help from another person to put on and taking off regular upper body clothing?: None Help from another person to put on and taking off regular lower body clothing?: A Little 6 Click Score: 20   End of Session Equipment Utilized During Treatment: Rolling walker;Gait belt  Activity Tolerance: Patient tolerated treatment  well Patient left: in chair;with call bell/phone within reach;with family/visitor present  OT Visit Diagnosis: Unsteadiness on feet (R26.81);Other abnormalities of gait and mobility (R26.89);Muscle weakness (generalized) (M62.81)                Time: 1021-1173 OT Time Calculation (min): 26 min Charges:  OT General Charges $OT Visit: 1 Visit OT Evaluation $OT Eval Moderate Complexity: 1 Mod OT Treatments $Self Care/Home Management : 8-22 mins  Nestor Lewandowsky, OTR/L Acute Rehabilitation Services Pager: [phone] Office: [phone]  Malka So 07/11/2020, 11:51 AM

## 2020-07-11 NOTE — Progress Notes (Addendum)
Progress Note  Patient Name: Morgan Boyer Date of Encounter: 07/11/2020  [facility] Cardiologist: Lauree Chandler, MD   Subjective   No chest pain or dyspnea.   Inpatient Medications    Scheduled Meds: . amiodarone  200 mg Oral BID  . apixaban  5 mg Oral BID  . B-complex with vitamin C  1 tablet Oral Daily  . colchicine  0.6 mg Oral Daily  . gabapentin  300 mg Oral QHS  . metoprolol tartrate  25 mg Oral BID  . pantoprazole  40 mg Oral BID AC  . simvastatin  20 mg Oral QPM  . sodium chloride flush  3 mL Intravenous Q12H  . sodium chloride flush  3 mL Intravenous Q12H   Continuous Infusions: . sodium chloride 10 mL/hr at 07/08/20 0943  . sodium chloride     PRN Meds: sodium chloride, acetaminophen, Muscle Rub, ondansetron (ZOFRAN) IV, polyvinyl alcohol, sodium chloride flush, traMADol   Vital Signs    Vitals:   07/10/20 2325 07/11/20 0400 07/11/20 0443 07/11/20 0718  BP: 110/75 120/79  (!) 145/81  Pulse: (!) 47 (!) 50  (!) 54  Resp: 14 14  18   Temp: (!) 97.1 F (36.2 C) (!) 97.4 F (36.3 C)  97.7 F (36.5 C)  TempSrc: Axillary Axillary  Oral  SpO2: 99% 97%  96%  Weight:   65.6 kg     Intake/Output Summary (Last 24 hours) at 07/11/2020 0857 Last data filed at 07/10/2020 1700 Gross per 24 hour  Intake 240 ml  Output --  Net 240 ml   Last 3 Weights 07/11/2020 07/10/2020 07/09/2020  Weight (lbs) 144 lb 10 oz 143 lb 15.4 oz 143 lb 15.4 oz  Weight (kg) 65.6 kg 65.3 kg 65.3 kg      Telemetry    sinus - Personally Reviewed  ECG     Physical Exam   GEN: Thin frail female, NAD  Neck: No JVD Cardiac: RRR, no murmurs, rubs, or gallops.  Respiratory: Clear to auscultation bilaterally. GI: Soft, nontender, non-distended  MS: No edema; No deformity. Neuro:  Nonfocal  Psych: Normal affect   Labs    High Sensitivity Troponin:   Recent Labs  Lab 07/05/20 1911 07/06/20 0841 07/06/20 1037  TROPONINIHS 19* 15 16      Chemistry Recent Labs   Lab 07/05/20 1911 07/06/20 0337 07/10/20 0030 07/10/20 1053 07/11/20 0035  NA 139   < > 135 136 134*  K 3.4*   < > 5.0 5.4* 5.6*  CL 104   < > 107 106 103  CO2 26   < > 22 20* 20*  GLUCOSE 119*   < > 162* 141* 122*  BUN 14   < > 43* 52* 62*  CREATININE 0.79   < > 1.42* 1.79* 2.33*  CALCIUM 8.8*   < > 8.4* 8.5* 8.3*  PROT 6.6  --   --   --   --   ALBUMIN 3.8  --   --   --   --   AST 29  --   --   --   --   ALT 18  --   --   --   --   ALKPHOS 50  --   --   --   --   BILITOT 2.0*  --   --   --   --   GFRNONAA >60   < > 36* 27* 20*  ANIONGAP 9   < > 6  10 11   < > = values in this interval not displayed.     Hematology Recent Labs  Lab 07/07/20 0054 07/07/20 1347 07/07/20 1406 07/08/20 0010 07/09/20 0043  WBC 14.7*  --   --  13.4* 13.6*  RBC 4.22  --   --  4.25 4.85  HGB 12.2   < > 11.6* 12.2 14.2  HCT 37.2   < > 34.0* 38.3 44.1  MCV 88.2  --   --  90.1 90.9  MCH 28.9  --   --  28.7 29.3  MCHC 32.8  --   --  31.9 32.2  RDW 14.3  --   --  14.2 14.5  PLT 173  --   --  185 231   < > = values in this interval not displayed.    BNP Recent Labs  Lab 07/06/20 0653  BNP 611.9*     DDimer No results for input(s): DDIMER in the last 168 hours.   Radiology    No results found.  Cardiac Studies   [facility] 07/07/2020   Ost LAD to Prox LAD lesion is 40% stenosed.  Severe mitral annular calcification.  Mild nonobstructive CAD with 40% smooth proximal LAD stenosis with mild irregularity,and minimal luminal irregularity in the left circumflex and dominant RCA  Moderate right heart pressure elevation with peak V wave in the 36-40 range on the initial PW tracing, consistent with the patient's significant mitral regurgitation.  Very mild aortic stenosis.  RECOMMENDATION: Patient will be undergoing a TEE tentatively scheduled for tomorrow for further evaluation of her mitral regurgitation.  TTE 07/06/2020   1. Left ventricular ejection fraction, by estimation,  is 60 to 65%. The  left ventricle has normal function. The left ventricle has no regional  wall motion abnormalities. There is moderate left ventricular hypertrophy.  Left ventricular diastolic function  could not be evaluated.  2. The pericardial effusion is posterior to the left ventricle.  3. The mitral annulus is calcified and the posterior leafelt appears  restricted at the base and the tip of the leaflet prolapses. There is  severe eccentric anterior and medially directed mitral regurgitation. The  mitral valve is degenerative. Severe  mitral valve regurgitation.  4. Tricuspid valve regurgitation is mild to moderate.  5. The aortic valve is tricuspid. Aortic valve regurgitation is not  visualized. Moderate aortic valve stenosis. Aortic valve area, by VTI  measures 1.22 cm. AVA by planimetry was 1.31 cm2.  6. Left atrial size was severely dilated.  7. Right ventricular systolic function is normal. The right ventricular  size is normal. There is severely elevated pulmonary artery systolic  pressure.  8. The inferior vena cava is normal in size with <50% respiratory  variability, suggesting right atrial pressure of 8 mmHg.   Patient Profile     87 y.o. female with history of mild CAD, HTN, HLD, memory disorder, peripheral neuropathy, prior CVA, atrial fibrillation and severe mitral regurgitation admitted 07/05/20 with chest pain. Echo 07/06/20 with LVEF=60-65%, moderate LVH, Severe MR. TEE 07/08/20 with severe MR secondary to flail P1/P2. Cardiac cath 07/07/20 with mild CAD  Assessment & Plan    1. Severe mitral regurgitation: Planning outpatient follow up with Dr. Burt Knack to be evaluated for Mitral clip. She may not be a good candidate given overall poor functional status and fragility.   2. CAD: Mild CAD by cath 07/07/20.   3. Atrial fibrillation, persistent: sinus today.  Continue amiodarone and Eliquis  4. Pericarditis: Continue colchicine.  5. HTN: BP is ok today  6.  Acute on chronic renal failure: Renal function mildly worse today post cath/contrast loading. Will continue to hold Lisinopril. Repeat BMET in am.   Disposition: will need planning for SNF based on PT report.   For questions or updates, please contact [place] Please consult [email or web address] for contact info under        Signed, Lauree Chandler, MD  07/11/2020, 8:57 AM

## 2020-07-11 NOTE — NC FL2 (Signed)
[place] MEDICAID FL2 LEVEL OF CARE SCREENING TOOL     IDENTIFICATION  Patient Name: Morgan Boyer Birthdate: 1931/11/21 Sex: female Admission Date (Current Location): 07/05/2020  [facility] [employer] and [place] Number:  Herbalist and Address:  [facility] [place] [address] [place], [place] [address]      Provider Number: [id number]  Attending Physician Name and Address:  Sueanne Margarita, MD  Relative Name and Phone Number:  Adiya, Sel[place] Nicole Kindred) (Son)   [phone]    Current Level of Care: Hospital Recommended Level of Care: [place] Prior Approval Number:    Date Approved/Denied:   PASRR Number:    Discharge Plan: SNF    Current Diagnoses: Patient Active Problem List   Diagnosis Date Noted  . Unstable angina ([place]) 07/06/2020  . Paroxysmal atrial fibrillation ([place]) 07/06/2020  . Severe mitral regurgitation 07/06/2020  . Congestive heart failure (CHF) ([place]) 07/06/2020  . Chest pain at rest 07/05/2020  . Hemiparesis affecting left side as late effect of stroke ([place])   . Neuropathy   . Acute blood loss anemia   . Hypoalbuminemia due to protein-calorie malnutrition ([place])   . Embolic stroke ([place]) 54/65/6812  . Esophageal stricture 10/30/2018  . Family hx-stroke 10/27/2018  . Advanced age 85/27/2020  . Stroke (cerebrum) (HCC)-R MCA & L MCA infarcts, embolic, source unknown 75/17/0017  . Thrombocytopenia ([place])   . Essential hypertension   . Hyperlipidemia   . Hypokalemia   . Lumbar stenosis with neurogenic claudication 07/03/2017  . Near syncope 11/10/2014  . Nocturnal leg cramps 07/15/2014  . Abnormality of gait 07/15/2014  . Polyneuropathy in other diseases classified elsewhere ([place]) 01/14/2013  . Memory changes 01/14/2013    Orientation RESPIRATION BLADDER Height & Weight     Self,Time,Situation,Place  Normal External catheter Weight: 144 lb 10 oz (65.6 kg) Height:     BEHAVIORAL SYMPTOMS/MOOD NEUROLOGICAL BOWEL  NUTRITION STATUS      Continent Diet (See DC Summary)  AMBULATORY STATUS COMMUNICATION OF NEEDS Skin   Extensive Assist Verbally Normal                       Personal Care Assistance Level of Assistance  Bathing,Feeding,Dressing Bathing Assistance: Maximum assistance Feeding assistance: Independent Dressing Assistance: Maximum assistance     Functional Limitations Info  Sight,Speech,Hearing Sight Info: Adequate Hearing Info: Impaired Speech Info: Adequate    SPECIAL CARE FACTORS FREQUENCY  PT (By licensed PT),OT (By licensed OT)     PT Frequency: 5x a week OT Frequency: 5x a week            Contractures Contractures Info: Not present    Additional Factors Info  Code Status,Allergies Code Status Info: Full Allergies Info: Septra (Sulfamethoxazole-trimethoprim)   Lotensin (Benazepril Hcl)           Current Medications (07/11/2020):  This is the current hospital active medication list Current Facility-Administered Medications  Medication Dose Route Frequency Provider Last Rate Last Admin  . 0.9 %  sodium chloride infusion   Intravenous Continuous Buford Dresser, MD 10 mL/hr at 07/08/20 0943 New Bag at 07/08/20 0943  . 0.9 %  sodium chloride infusion  250 mL Intravenous PRN Buford Dresser, MD      . acetaminophen (TYLENOL) tablet 650 mg  650 mg Oral Q6H PRN Buford Dresser, MD      . amiodarone (PACERONE) tablet 200 mg  200 mg Oral BID Josue Hector, MD   200  mg at 07/11/20 0906  . apixaban (ELIQUIS) tablet 2.5 mg  2.5 mg Oral BID Kris Mouton, [facility]      . B-complex with vitamin C tablet 1 tablet  1 tablet Oral Daily Buford Dresser, MD   1 tablet at 07/11/20 0906  . colchicine tablet 0.6 mg  0.6 mg Oral Daily O'Neal, Cassie Freer, MD   0.6 mg at 07/11/20 0906  . gabapentin (NEURONTIN) capsule 300 mg  300 mg Oral QHS Buford Dresser, MD   300 mg at 07/10/20 2129  . metoprolol tartrate (LOPRESSOR) tablet 25 mg  25 mg Oral  BID Geralynn Rile, MD   25 mg at 07/11/20 0906  . Muscle Rub CREA 1 application  1 application Topical QID PRN Buford Dresser, MD   1 application at 13/14/38 1044  . ondansetron (ZOFRAN) injection 4 mg  4 mg Intravenous Q6H PRN Buford Dresser, MD      . pantoprazole (PROTONIX) EC tablet 40 mg  40 mg Oral BID AC Josue Hector, MD   40 mg at 07/11/20 0906  . polyvinyl alcohol (LIQUIFILM TEARS) 1.4 % ophthalmic solution 1 drop  1 drop Both Eyes PRN Buford Dresser, MD      . simvastatin (ZOCOR) tablet 20 mg  20 mg Oral QPM Buford Dresser, MD   20 mg at 07/10/20 1733  . sodium chloride flush (NS) 0.9 % injection 3 mL  3 mL Intravenous Q12H Buford Dresser, MD   3 mL at 07/11/20 0907  . sodium chloride flush (NS) 0.9 % injection 3 mL  3 mL Intravenous Q12H Buford Dresser, MD   3 mL at 07/11/20 0907  . sodium chloride flush (NS) 0.9 % injection 3 mL  3 mL Intravenous PRN Buford Dresser, MD      . traMADol Veatrice Bourbon) tablet 50 mg  50 mg Oral Q6H PRN Buford Dresser, MD   50 mg at 07/09/20 2236     Discharge Medications: Please see discharge summary for a list of discharge medications.  Relevant Imaging Results:  Relevant Lab Results:   Additional Information SSN# [id number]  Reece Agar, [place]

## 2020-07-11 NOTE — Progress Notes (Signed)
[place] for Eliquis Indication: atrial fibrillation  Allergies  Allergen Reactions  . Septra [Sulfamethoxazole-Trimethoprim] Hives  . Lotensin [Benazepril Hcl] Other (See Comments)    NOT KNOWN    Patient Measurements: Weight: 65.6 kg (144 lb 10 oz) Heparin Dosing Weight: 65.3 kg  Vital Signs: Temp: 97.7 F (36.5 C) (04/11 0718) Temp Source: Oral (04/11 0718) BP: 145/81 (04/11 0718) Pulse Rate: 54 (04/11 0718)  Labs: Recent Labs    07/09/20 0043 07/09/20 1021 07/10/20 0030 07/10/20 1053 07/11/20 0035  HGB 14.2  --   --   --   --   HCT 44.1  --   --   --   --   PLT 231  --   --   --   --   CREATININE 0.94   < > 1.42* 1.79* 2.33*   < > = values in this interval not displayed.    Estimated Creatinine Clearance: 15.4 mL/min (A) (by C-G formula based on SCr of 2.33 mg/dL (H)).   Assessment: 87 yo female on chronic Eliquis for afib.  Has been held during admission for consideration of cardiac procedures.  Pharmacy asked to resume Eliquis today.  Pt is > 92 yo, and SCr trend up to 2.33   Goal of Therapy:  Monitor platelets by anticoagulation protocol: Yes   Plan:  - Reduce Eliquis to 2.5 mg BID since age > 38 and now with Scr > 1.5   Hildred Laser, PharmD Clinical Pharmacist **Pharmacist phone directory can now be found on [email or web address] (PW [email or web address]).  Listed under [place].

## 2020-07-11 NOTE — Plan of Care (Signed)
  Problem: Health Behavior/Discharge Planning: Goal: Ability to manage health-related needs will improve 07/11/2020 0956 by Thressa Sheller, RN Outcome: Progressing 07/11/2020 0956 by Thressa Sheller, RN Outcome: Progressing   Problem: Clinical Measurements: Goal: Ability to maintain clinical measurements within normal limits will improve Outcome: Progressing Goal: Will remain free from infection Outcome: Progressing Goal: Diagnostic test results will improve Outcome: Progressing Goal: Respiratory complications will improve 07/11/2020 0956 by Thressa Sheller, RN Outcome: Progressing 07/11/2020 0956 by Thressa Sheller, RN Outcome: Progressing Goal: Cardiovascular complication will be avoided 07/11/2020 0956 by Thressa Sheller, RN Outcome: Progressing 07/11/2020 0956 by Thressa Sheller, RN Outcome: Progressing   Problem: Coping: Goal: Level of anxiety will decrease Outcome: Progressing   Problem: Elimination: Goal: Will not experience complications related to bowel motility 07/11/2020 0956 by Thressa Sheller, RN Outcome: Progressing 07/11/2020 0956 by Thressa Sheller, RN Outcome: Progressing Goal: Will not experience complications related to urinary retention 07/11/2020 0956 by Thressa Sheller, RN Outcome: Progressing 07/11/2020 0956 by Thressa Sheller, RN Outcome: Progressing   Problem: Pain Managment: Goal: General experience of comfort will improve Description: General experience of comfort will improve 07/11/2020 0956 by Thressa Sheller, RN Outcome: Progressing 07/11/2020 0956 by Thressa Sheller, RN Outcome: Progressing   Problem: Safety: Goal: Ability to remain free from injury will improve Outcome: Progressing   Problem: Skin Integrity: Goal: Risk for impaired skin integrity will decrease Outcome: Progressing

## 2020-07-11 NOTE — TOC Initial Note (Addendum)
Transition of Care [facility]) - Initial/Assessment Note    Patient Details  Name: Morgan Boyer MRN: [id number] Date of Birth: Aug 22, 1931  Transition of [facility]) CM/SW Contact:    Tresa Endo Phone Number: 07/11/2020, 12:13 PM  Clinical Narrative:                 12:00pm- CSW spoke w/ pt son Nicole Kindred (in room w/pt) about SNF option prior to DC, CSW offered [facility] for SNF in [place]. Nicole Kindred is primary caregiver and would like CSW to fax out to [place], [place], and [facility], pt has been to [place] for knee rehab in the past and would like this option first if it becomes available. Pt has all COVID vaccines plus booster. CSW has faxed out and waiting on responses, CSW will follow up.         Patient Goals and CMS Choice        Expected Discharge Plan and Services                                                Prior Living Arrangements/Services                       Activities of Daily Living      Permission Sought/Granted                  Emotional Assessment              Admission diagnosis:  Chest pain at rest [R07.9] Patient Active Problem List   Diagnosis Date Noted  . Unstable angina ([place]) 07/06/2020  . Paroxysmal atrial fibrillation ([place]) 07/06/2020  . Severe mitral regurgitation 07/06/2020  . Congestive heart failure (CHF) ([place]) 07/06/2020  . Chest pain at rest 07/05/2020  . Hemiparesis affecting left side as late effect of stroke ([place])   . Neuropathy   . Acute blood loss anemia   . Hypoalbuminemia due to protein-calorie malnutrition ([place])   . Embolic stroke ([place]) 50/53/9767  . Esophageal stricture 10/30/2018  . Family hx-stroke 10/27/2018  . Advanced age 27/27/2020  . Stroke (cerebrum) (HCC)-R MCA & L MCA infarcts, embolic, source unknown 34/19/3790  . Thrombocytopenia ([place])   . Essential hypertension   . Hyperlipidemia   . Hypokalemia   . Lumbar stenosis with neurogenic claudication 07/03/2017  . Near  syncope 11/10/2014  . Nocturnal leg cramps 07/15/2014  . Abnormality of gait 07/15/2014  . Polyneuropathy in other diseases classified elsewhere ([place]) 01/14/2013  . Memory changes 01/14/2013   PCP:  Aretta Nip, MD Pharmacy:   [facility], [place] [address] Phone: [phone] Fax: [phone]     Social Determinants of Health (SDOH) Interventions    Readmission Risk Interventions No flowsheet data found.

## 2020-07-12 ENCOUNTER — Telehealth: Payer: Self-pay | Admitting: Physician Assistant

## 2020-07-12 DIAGNOSIS — I340 Nonrheumatic mitral (valve) insufficiency: Secondary | ICD-10-CM | POA: Diagnosis not present

## 2020-07-12 DIAGNOSIS — I480 Paroxysmal atrial fibrillation: Secondary | ICD-10-CM | POA: Diagnosis not present

## 2020-07-12 LAB — BASIC METABOLIC PANEL
Anion gap: 13 (ref 5–15)
BUN: 70 mg/dL — ABNORMAL HIGH (ref 8–23)
CO2: 19 mmol/L — ABNORMAL LOW (ref 22–32)
Calcium: 8.4 mg/dL — ABNORMAL LOW (ref 8.9–10.3)
Chloride: 105 mmol/L (ref 98–111)
Creatinine, Ser: 2.26 mg/dL — ABNORMAL HIGH (ref 0.44–1.00)
GFR, Estimated: 21 mL/min — ABNORMAL LOW (ref >60)
Glucose, Bld: 102 mg/dL — ABNORMAL HIGH (ref 70–99)
Potassium: 4.8 mmol/L (ref 3.5–5.1)
Sodium: 137 mmol/L (ref 135–145)

## 2020-07-12 MED ORDER — SODIUM CHLORIDE 0.9 % IV SOLN: INTRAVENOUS | Status: AC

## 2020-07-12 NOTE — Progress Notes (Signed)
Physical Therapy Treatment Patient Details Name: Morgan Boyer MRN: [id number] DOB: 11/23/1931 Today's Date: 07/12/2020    History of Present Illness 87 yo admitted 4/5 with chest pain and LE edema with [place]. Pt s/p radial heart cath 4/7. PMhx: severe mitral regurgitation, HFpEF, HTN, memory impairment, CVA, HLD, GERD, Afib    PT Comments    Pt with flat affect, disoriented other than to self and demonstrates decreased awareness, safety and function. Pt with limited ability to stand and transfers today and fatigued with transfers unable to ambulate and requiring assist for pericare. Pt educated for bil LE HEP but very limited effort into muscle contraction with exercise and setup for breakfast end of session. Current Plan is still appropriate. Will continue to follow.   HR 69 SpO2 100% on RA    Follow Up Recommendations  SNF;Supervision/Assistance - 24 hour     Equipment Recommendations  3in1 (PT);Wheelchair (measurements PT)    Recommendations for Other Services       Precautions / Restrictions Precautions Precautions: Fall;Other (comment) Precaution Comments: no lifting RUE until 4/14 Restrictions Weight Bearing Restrictions: No    Mobility  Bed Mobility Overal bed mobility: Needs Assistance Bed Mobility: Supine to Sit     Supine to sit: Min assist     General bed mobility comments: assist to initiate with assist to move legs to EOB and elevate trunk with increased time    Transfers Overall transfer level: Needs assistance   Transfers: Sit to/from Stand;Stand Pivot Transfers Sit to Stand: Min assist         General transfer comment: min assist with mod cues for hand placement, anterior translation and assist to rise from bed x 1, from BSC x 2 and to chair. Pivot bed to [facility] with UE support on therapist and [facility] with cues for sequence and safety. Attempted to have pt walk from [facility] with RW but ultimately was a 2' pivot from [facility] to recliner as pt unable to progress  gait. Mod cues for posture and RW management  Ambulation/Gait             General Gait Details: unable to succesfully perform this session   Stairs             Wheelchair Mobility    Modified Rankin (Stroke Patients Only)       Balance Overall balance assessment: Needs assistance Sitting-balance support: Feet supported;Single extremity supported Sitting balance-Leahy Scale: Poor Sitting balance - Comments: EOB single UE support, BSC with use of armrests   Standing balance support: Bilateral upper extremity supported Standing balance-Leahy Scale: Poor Standing balance comment: bil UE support in standing and pivot                            Cognition Arousal/Alertness: Awake/alert Behavior During Therapy: Flat affect Overall Cognitive Status: Impaired/Different from baseline Area of Impairment: Memory;Following commands;Safety/judgement;Awareness;Problem solving;Orientation;Attention                 Orientation Level: Disoriented to;Time;Situation;Place Current Attention Level: Focused Memory: Decreased recall of precautions;Decreased short-term memory Following Commands: Follows one step commands with increased time;Follows one step commands inconsistently Safety/Judgement: Decreased awareness of safety;Decreased awareness of deficits Awareness: Intellectual Problem Solving: Slow processing;Requires verbal cues;Requires tactile cues General Comments: pt oriented to self only on arrival with slow processing and multimodal cueing throughout transfers. Pt aware of need to void and able to express need for assist with total assist for pericare and  linen management      Exercises General Exercises - Lower Extremity Long Arc Quad: AAROM;Both;Seated;10 reps    General Comments        Pertinent Vitals/Pain Pain Assessment: No/denies pain    Home Living                      Prior Function            PT Goals (current goals can  now be found in the care plan section) Progress towards PT goals: Progressing toward goals    Frequency    Min 2X/week      PT Plan Current plan remains appropriate    Co-evaluation              AM-PAC PT "6 Clicks" Mobility   Outcome Measure  Help needed turning from your back to your side while in a flat bed without using bedrails?: A Little Help needed moving from lying on your back to sitting on the side of a flat bed without using bedrails?: A Little Help needed moving to and from a bed to a chair (including a wheelchair)?: A Lot Help needed standing up from a chair using your arms (e.g., wheelchair or bedside chair)?: A Lot Help needed to walk in hospital room?: A Lot Help needed climbing 3-5 steps with a railing? : Total 6 Click Score: 13    End of Session Equipment Utilized During Treatment: Gait belt Activity Tolerance: Patient limited by fatigue Patient left: in chair;with call bell/phone within reach;with chair alarm set Nurse Communication: Mobility status PT Visit Diagnosis: Other abnormalities of gait and mobility (R26.89);Muscle weakness (generalized) (M62.81);Difficulty in walking, not elsewhere classified (R26.2)     Time: 4097-3532 PT Time Calculation (min) (ACUTE ONLY): 24 min  Charges:  $Therapeutic Activity: 23-37 mins                     Eudell Julian P, PT Acute Rehabilitation Services Pager: [phone] Office: [phone]    Adelheid Hoggard B Mckynleigh Mussell 07/12/2020, 9:57 AM

## 2020-07-12 NOTE — TOC Progression Note (Signed)
Transition of Care [facility]) - Progression Note    Patient Details  Name: AVANNAH DECKER MRN: [id number] Date of Birth: 1931/08/13  [facility]) CM/SW Contact  Reece Agar, [place] Phone Number: 07/12/2020, 9:30 AM  Clinical Narrative:    9:30am- CSW spoke with pt son Nicole Kindred to update him on SNF options; [facility] accepted offer and [facility] did not. Nicole Kindred stated he was out visiting facilities (as we spoke) and he would like to meet with CSW when he returns to hospital bc he does not want to make a decision just yet. CSW will follow up with pt son.   Expected Discharge Plan: [place] Barriers to Discharge: Continued Medical Work up  Expected Discharge Plan and Services Expected Discharge Plan: [place] In-house Referral: Clinical Social Work Discharge Planning Services: NA (CSW)   Living arrangements for the past 2 months: Single Family Home                                       Social Determinants of Health (SDOH) Interventions    Readmission Risk Interventions No flowsheet data found.

## 2020-07-12 NOTE — Telephone Encounter (Signed)
  [facility]   MULTIDISCIPLINARY HEART VALVE TEAM    This looks like a clippable valve. The fossa looks approachable for transseptal puncture in the Bicaval view. TR is noted. LA dimensions are large enough for device steering and straddle. The MR jet is caused by a posterior leaflet prolapse. The posterior leaflet is measuring 1.5 cm in the 133 LVOT view. Gradient was measured at 3 mmHg. MVA measures about 5.77 cm2 from the Trangastric view. Based on the starting gradient, I'd recommend starting with an NTW and assessing.

## 2020-07-12 NOTE — Progress Notes (Addendum)
Progress Note  Patient Name: Morgan Boyer Date of Encounter: 07/12/2020  [facility] Cardiologist: Lauree Chandler, MD   Subjective   No complaints today. No chest pain or dyspnea.   Inpatient Medications    Scheduled Meds: . amiodarone  200 mg Oral BID  . apixaban  2.5 mg Oral BID  . B-complex with vitamin C  1 tablet Oral Daily  . colchicine  0.6 mg Oral Daily  . gabapentin  300 mg Oral QHS  . metoprolol tartrate  25 mg Oral BID  . pantoprazole  40 mg Oral BID AC  . simvastatin  20 mg Oral QPM  . sodium chloride flush  3 mL Intravenous Q12H  . sodium chloride flush  3 mL Intravenous Q12H   Continuous Infusions: . sodium chloride 10 mL/hr at 07/08/20 0943  . sodium chloride     PRN Meds: sodium chloride, acetaminophen, loperamide, Muscle Rub, ondansetron (ZOFRAN) IV, polyvinyl alcohol, sodium chloride flush, traMADol   Vital Signs    Vitals:   07/12/20 0300 07/12/20 0447 07/12/20 0600 07/12/20 0801  BP:   (!) 149/88 98/88  Pulse:   (!) 53 (!) 57  Resp:   15 20  Temp:  (!) 97.5 F (36.4 C)  98 F (36.7 C)  TempSrc:  Oral  Oral  SpO2:  98% 95% 100%  Weight: 63.6 kg       Intake/Output Summary (Last 24 hours) at 07/12/2020 0952 Last data filed at 07/12/2020 0800 Gross per 24 hour  Intake 403 ml  Output 200 ml  Net 203 ml   Last 3 Weights 07/12/2020 07/11/2020 07/10/2020  Weight (lbs) 140 lb 3.4 oz 144 lb 10 oz 143 lb 15.4 oz  Weight (kg) 63.6 kg 65.6 kg 65.3 kg      Telemetry    Sinus bradycardia - Personally Reviewed  ECG     Physical Exam    General: thin, elderly female HEENT: MMM SKIN: warm, dry. No rashes. Neuro: No focal deficits  Musculoskeletal: Muscle strength 5/5 all ext  Psychiatric: Mood and affect normal  Neck: No JVD Lungs:Clear bilaterally, no wheezes, rhonci, crackles Cardiovascular: Regular, brady, systolic murmur Abdomen:Soft. Bowel sounds present.  Extremities: No lower extremity edema.    Labs    High  Sensitivity Troponin:   Recent Labs  Lab 07/05/20 1911 07/06/20 0841 07/06/20 1037  TROPONINIHS 19* 15 16      Chemistry Recent Labs  Lab 07/05/20 1911 07/06/20 0337 07/10/20 1053 07/11/20 0035 07/12/20 0051  NA 139   < > 136 134* 137  K 3.4*   < > 5.4* 5.6* 4.8  CL 104   < > 106 103 105  CO2 26   < > 20* 20* 19*  GLUCOSE 119*   < > 141* 122* 102*  BUN 14   < > 52* 62* 70*  CREATININE 0.79   < > 1.79* 2.33* 2.26*  CALCIUM 8.8*   < > 8.5* 8.3* 8.4*  PROT 6.6  --   --   --   --   ALBUMIN 3.8  --   --   --   --   AST 29  --   --   --   --   ALT 18  --   --   --   --   ALKPHOS 50  --   --   --   --   BILITOT 2.0*  --   --   --   --  GFRNONAA >60   < > 27* 20* 21*  ANIONGAP 9   < > 10 11 13    < > = values in this interval not displayed.     Hematology Recent Labs  Lab 07/07/20 0054 07/07/20 1347 07/07/20 1406 07/08/20 0010 07/09/20 0043  WBC 14.7*  --   --  13.4* 13.6*  RBC 4.22  --   --  4.25 4.85  HGB 12.2   < > 11.6* 12.2 14.2  HCT 37.2   < > 34.0* 38.3 44.1  MCV 88.2  --   --  90.1 90.9  MCH 28.9  --   --  28.7 29.3  MCHC 32.8  --   --  31.9 32.2  RDW 14.3  --   --  14.2 14.5  PLT 173  --   --  185 231   < > = values in this interval not displayed.    BNP Recent Labs  Lab 07/06/20 0653  BNP 611.9*     DDimer No results for input(s): DDIMER in the last 168 hours.   Radiology    No results found.  Cardiac Studies   [facility] 07/07/2020   Ost LAD to Prox LAD lesion is 40% stenosed.  Severe mitral annular calcification.  Mild nonobstructive CAD with 40% smooth proximal LAD stenosis with mild irregularity,and minimal luminal irregularity in the left circumflex and dominant RCA  Moderate right heart pressure elevation with peak V wave in the 36-40 range on the initial PW tracing, consistent with the patient's significant mitral regurgitation.  Very mild aortic stenosis.  RECOMMENDATION: Patient will be undergoing a TEE tentatively  scheduled for tomorrow for further evaluation of her mitral regurgitation.  TTE 07/06/2020   1. Left ventricular ejection fraction, by estimation, is 60 to 65%. The  left ventricle has normal function. The left ventricle has no regional  wall motion abnormalities. There is moderate left ventricular hypertrophy.  Left ventricular diastolic function  could not be evaluated.  2. The pericardial effusion is posterior to the left ventricle.  3. The mitral annulus is calcified and the posterior leafelt appears  restricted at the base and the tip of the leaflet prolapses. There is  severe eccentric anterior and medially directed mitral regurgitation. The  mitral valve is degenerative. Severe  mitral valve regurgitation.  4. Tricuspid valve regurgitation is mild to moderate.  5. The aortic valve is tricuspid. Aortic valve regurgitation is not  visualized. Moderate aortic valve stenosis. Aortic valve area, by VTI  measures 1.22 cm. AVA by planimetry was 1.31 cm2.  6. Left atrial size was severely dilated.  7. Right ventricular systolic function is normal. The right ventricular  size is normal. There is severely elevated pulmonary artery systolic  pressure.  8. The inferior vena cava is normal in size with <50% respiratory  variability, suggesting right atrial pressure of 8 mmHg.   Patient Profile     87 y.o. female with history of mild CAD, HTN, HLD, memory disorder, peripheral neuropathy, prior CVA, atrial fibrillation and severe mitral regurgitation admitted 07/05/20 with chest pain. Echo 07/06/20 with LVEF=60-65%, moderate LVH, Severe MR. TEE 07/08/20 with severe MR secondary to flail P1/P2. Cardiac cath 07/07/20 with mild CAD  Assessment & Plan    1. Severe mitral regurgitation: Planning outpatient follow up with Dr. Burt Knack to be evaluated for Mitral clip. She may not be a good candidate given overall poor functional status and fragility. Her anatomy seems to be favorable for a clip.  2. CAD: Mild CAD by cath 07/07/20. No chest pain.   3. Atrial fibrillation, paroxysmal: sinus brady today. Will lower amiodarone to 200 mg daily.  Continue amiodarone and Eliquis  4. Pericarditis: Continue colchicine.   5. HTN: BP stable this am  6. CKD stage 3b/Acute worsening: Renal function stable today after mild worsening yesterday. Will continue to hold her Lisinopril. Gentle hydration today. BMET tomorrow.   Disposition: Working on SNF placement.   For questions or updates, please contact [place] Please consult [email or web address] for contact info under       Signed, Lauree Chandler, MD  07/12/2020, 9:52 AM

## 2020-07-12 NOTE — Care Management Important Message (Signed)
Important Message  Patient Details  Name: Morgan Boyer MRN: [id number] Date of Birth: Jul 16, 1931   Medicare Important Message Given:  Yes     Thorn Demas [address] 07/12/2020, 2:32 PM

## 2020-07-12 NOTE — Progress Notes (Signed)
Physical Therapy Treatment Patient Details Name: Morgan Boyer MRN: [id number] DOB: 10/30/1931 Today's Date: 07/12/2020    History of Present Illness 87 yo admitted 4/5 with chest pain and LE edema with [place]. Pt s/p radial heart cath 4/7. PMhx: severe mitral regurgitation, HFpEF, HTN, memory impairment, CVA, HLD, GERD, Afib    PT Comments    Pt very fatigued and with difficulty returning to bed with nursing. Used Stedy to assist back to bed. Continue to recommend SNF.    Follow Up Recommendations  SNF;Supervision/Assistance - 24 hour     Equipment Recommendations  3in1 (PT);Wheelchair (measurements PT)    Recommendations for Other Services       Precautions / Restrictions Precautions Precautions: Fall;Other (comment) Precaution Comments: no lifting RUE until 4/14    Mobility  Bed Mobility Overal bed mobility: Needs Assistance Bed Mobility: Sit to Supine       Sit to supine: +2 for physical assistance;Max assist   General bed mobility comments: Assist to lower trunk and to bring legs back into the bed    Transfers Overall transfer level: Needs assistance Equipment used: Ambulation equipment used Transfers: Sit to/from Stand Sit to Stand: +2 physical assistance;Max assist         General transfer comment: Used bed pad under hips to bring hips up. Pt with flexed hips and knees. Used Stedy for chair to bed.  Ambulation/Gait                 Stairs             Wheelchair Mobility    Modified Rankin (Stroke Patients Only)       Balance Overall balance assessment: Needs assistance Sitting-balance support: Feet supported;Bilateral upper extremity supported Sitting balance-Leahy Scale: Poor Sitting balance - Comments: min assist to sit EOB   Standing balance support: Bilateral upper extremity supported Standing balance-Leahy Scale: Zero Standing balance comment: +2 max and Stedy for standing                            Cognition  Arousal/Alertness: Awake/alert Behavior During Therapy: Flat affect Overall Cognitive Status: Impaired/Different from baseline Area of Impairment: Memory;Following commands;Safety/judgement;Awareness;Problem solving;Orientation;Attention                 Orientation Level: Disoriented to;Time;Situation;Place Current Attention Level: Focused Memory: Decreased recall of precautions;Decreased short-term memory Following Commands: Follows one step commands with increased time;Follows one step commands inconsistently Safety/Judgement: Decreased awareness of safety;Decreased awareness of deficits Awareness: Intellectual Problem Solving: Slow processing;Requires verbal cues;Requires tactile cues        Exercises      General Comments        Pertinent Vitals/Pain      Home Living                      Prior Function            PT Goals (current goals can now be found in the care plan section) Progress towards PT goals: Not progressing toward goals - comment    Frequency    Min 2X/week      PT Plan Current plan remains appropriate    Co-evaluation              AM-PAC PT "6 Clicks" Mobility   Outcome Measure  Help needed turning from your back to your side while in a flat bed without using bedrails?: A Little Help needed moving  from lying on your back to sitting on the side of a flat bed without using bedrails?: A Little Help needed moving to and from a bed to a chair (including a wheelchair)?: Total Help needed standing up from a chair using your arms (e.g., wheelchair or bedside chair)?: Total Help needed to walk in hospital room?: Total Help needed climbing 3-5 steps with a railing? : Total 6 Click Score: 10    End of Session Equipment Utilized During Treatment: Gait belt Activity Tolerance: Patient limited by fatigue Patient left: in bed;with nursing/sitter in room Nurse Communication: Mobility status;Need for lift equipment (nurse  assisted) PT Visit Diagnosis: Other abnormalities of gait and mobility (R26.89);Muscle weakness (generalized) (M62.81);Difficulty in walking, not elsewhere classified (R26.2)     Time: 4259-5638 PT Time Calculation (min) (ACUTE ONLY): 12 min  Charges:  $Therapeutic Activity: 8-22 mins                     [place] Pager [phone] Office [place] 07/12/2020, 7:03 PM

## 2020-07-13 DIAGNOSIS — I340 Nonrheumatic mitral (valve) insufficiency: Secondary | ICD-10-CM | POA: Diagnosis not present

## 2020-07-13 DIAGNOSIS — I10 Essential (primary) hypertension: Secondary | ICD-10-CM | POA: Diagnosis not present

## 2020-07-13 DIAGNOSIS — I480 Paroxysmal atrial fibrillation: Secondary | ICD-10-CM | POA: Diagnosis not present

## 2020-07-13 LAB — CBC
HCT: 39.1 % (ref 36.0–46.0)
Hemoglobin: 12.5 g/dL (ref 12.0–15.0)
MCH: 28.9 pg (ref 26.0–34.0)
MCHC: 32 g/dL (ref 30.0–36.0)
MCV: 90.3 fL (ref 80.0–100.0)
Platelets: 310 10*3/uL (ref 150–400)
RBC: 4.33 MIL/uL (ref 3.87–5.11)
RDW: 14.3 % (ref 11.5–15.5)
WBC: 9.1 10*3/uL (ref 4.0–10.5)
nRBC: 0 % (ref 0.0–0.2)

## 2020-07-13 LAB — BASIC METABOLIC PANEL WITH GFR
Anion gap: 8 (ref 5–15)
BUN: 53 mg/dL — ABNORMAL HIGH (ref 8–23)
CO2: 21 mmol/L — ABNORMAL LOW (ref 22–32)
Calcium: 8.1 mg/dL — ABNORMAL LOW (ref 8.9–10.3)
Chloride: 109 mmol/L (ref 98–111)
Creatinine, Ser: 1.68 mg/dL — ABNORMAL HIGH (ref 0.44–1.00)
GFR, Estimated: 29 mL/min — ABNORMAL LOW
Glucose, Bld: 109 mg/dL — ABNORMAL HIGH (ref 70–99)
Potassium: 4.1 mmol/L (ref 3.5–5.1)
Sodium: 138 mmol/L (ref 135–145)

## 2020-07-13 MED ORDER — AMIODARONE HCL 200 MG PO TABS
200.0000 mg | ORAL_TABLET | Freq: Every day | ORAL | Status: DC
  Administered 2020-07-13 – 2020-07-15 (×3): 200 mg via ORAL
  Filled 2020-07-13 (×3): qty 1

## 2020-07-13 MED ORDER — GERHARDT'S BUTT CREAM
TOPICAL_CREAM | Freq: Two times a day (BID) | CUTANEOUS | Status: DC
  Filled 2020-07-13: qty 1

## 2020-07-13 NOTE — Plan of Care (Signed)
  Problem: Health Behavior/Discharge Planning: Goal: Ability to manage health-related needs will improve Outcome: Progressing   Problem: Clinical Measurements: Goal: Ability to maintain clinical measurements within normal limits will improve Outcome: Progressing Goal: Will remain free from infection Outcome: Progressing Goal: Diagnostic test results will improve Outcome: Progressing Goal: Respiratory complications will improve Outcome: Progressing Goal: Cardiovascular complication will be avoided Outcome: Progressing   Problem: Coping: Goal: Level of anxiety will decrease Outcome: Progressing   Problem: Elimination: Goal: Will not experience complications related to bowel motility Outcome: Progressing Goal: Will not experience complications related to urinary retention Outcome: Progressing   Problem: Pain Managment: Goal: General experience of comfort will improve Description: General experience of comfort will improve Outcome: Progressing   Problem: Safety: Goal: Ability to remain free from injury will improve Outcome: Progressing   Problem: Skin Integrity: Goal: Risk for impaired skin integrity will decrease Outcome: Progressing

## 2020-07-13 NOTE — Plan of Care (Signed)
  Problem: Health Behavior/Discharge Planning: Goal: Ability to manage health-related needs will improve Outcome: Progressing   Problem: Clinical Measurements: Goal: Ability to maintain clinical measurements within normal limits will improve Outcome: Progressing Goal: Will remain free from infection Outcome: Progressing   Problem: Elimination: Goal: Will not experience complications related to bowel motility Outcome: Progressing

## 2020-07-13 NOTE — Progress Notes (Signed)
Progress Note  Patient Name: Morgan Boyer Date of Encounter: 07/13/2020  [facility] Cardiologist: Lauree Chandler, MD   Subjective   No chest pain or dyspnea  Inpatient Medications    Scheduled Meds: . amiodarone  200 mg Oral BID  . apixaban  2.5 mg Oral BID  . B-complex with vitamin C  1 tablet Oral Daily  . colchicine  0.6 mg Oral Daily  . gabapentin  300 mg Oral QHS  . metoprolol tartrate  25 mg Oral BID  . pantoprazole  40 mg Oral BID AC  . simvastatin  20 mg Oral QPM  . sodium chloride flush  3 mL Intravenous Q12H  . sodium chloride flush  3 mL Intravenous Q12H   Continuous Infusions: . sodium chloride 10 mL/hr at 07/08/20 0943  . sodium chloride     PRN Meds: sodium chloride, acetaminophen, loperamide, Muscle Rub, ondansetron (ZOFRAN) IV, polyvinyl alcohol, sodium chloride flush, traMADol   Vital Signs    Vitals:   07/12/20 2229 07/13/20 0000 07/13/20 0200 07/13/20 0400  BP: (!) 150/83 127/75 139/80 (!) 141/86  Pulse: 61 (!) 52 (!) 45 (!) 51  Resp:  19 16 15   Temp:  97.7 F (36.5 C)  97.6 F (36.4 C)  TempSrc:  Oral  Oral  SpO2:  97% 97% 97%  Weight:        Intake/Output Summary (Last 24 hours) at 07/13/2020 0818 Last data filed at 07/12/2020 1700 Gross per 24 hour  Intake 814.61 ml  Output --  Net 814.61 ml   Last 3 Weights 07/12/2020 07/11/2020 07/10/2020  Weight (lbs) 140 lb 3.4 oz 144 lb 10 oz 143 lb 15.4 oz  Weight (kg) 63.6 kg 65.6 kg 65.3 kg      Telemetry    Sinus brady - Personally Reviewed  ECG     Physical Exam   General: Thin, elderly female HEENT: OP clear, mucus membranes moist  SKIN: warm, dry. No rashes. Neuro: No focal deficits  Musculoskeletal: Muscle strength 5/5 all ext  Psychiatric: Mood and affect normal  Neck: No JVD, no carotid bruits, no thyromegaly, no lymphadenopathy.  Lungs:Clear bilaterally, no wheezes, rhonci, crackles Cardiovascular: Regular rate and rhythm. Systolic murmur.  Abdomen:Soft.  Bowel sounds present. Non-tender.  Extremities: No lower extremity edema.   Labs    High Sensitivity Troponin:   Recent Labs  Lab 07/05/20 1911 07/06/20 0841 07/06/20 1037  TROPONINIHS 19* 15 16      Chemistry Recent Labs  Lab 07/11/20 0035 07/12/20 0051 07/13/20 0130  NA 134* 137 138  K 5.6* 4.8 4.1  CL 103 105 109  CO2 20* 19* 21*  GLUCOSE 122* 102* 109*  BUN 62* 70* 53*  CREATININE 2.33* 2.26* 1.68*  CALCIUM 8.3* 8.4* 8.1*  GFRNONAA 20* 21* 29*  ANIONGAP 11 13 8      Hematology Recent Labs  Lab 07/08/20 0010 07/09/20 0043 07/13/20 0130  WBC 13.4* 13.6* 9.1  RBC 4.25 4.85 4.33  HGB 12.2 14.2 12.5  HCT 38.3 44.1 39.1  MCV 90.1 90.9 90.3  MCH 28.7 29.3 28.9  MCHC 31.9 32.2 32.0  RDW 14.2 14.5 14.3  PLT 185 231 310    BNP No results for input(s): BNP, PROBNP in the last 168 hours.   DDimer No results for input(s): DDIMER in the last 168 hours.   Radiology    No results found.  Cardiac Studies   [facility] 07/07/2020   Ost LAD to Prox LAD lesion is 40% stenosed.  Severe mitral annular calcification.  Mild nonobstructive CAD with 40% smooth proximal LAD stenosis with mild irregularity,and minimal luminal irregularity in the left circumflex and dominant RCA  Moderate right heart pressure elevation with peak V wave in the 36-40 range on the initial PW tracing, consistent with the patient's significant mitral regurgitation.  Very mild aortic stenosis.  RECOMMENDATION: Patient will be undergoing a TEE tentatively scheduled for tomorrow for further evaluation of her mitral regurgitation.  TTE 07/06/2020   1. Left ventricular ejection fraction, by estimation, is 60 to 65%. The  left ventricle has normal function. The left ventricle has no regional  wall motion abnormalities. There is moderate left ventricular hypertrophy.  Left ventricular diastolic function  could not be evaluated.  2. The pericardial effusion is posterior to the left  ventricle.  3. The mitral annulus is calcified and the posterior leafelt appears  restricted at the base and the tip of the leaflet prolapses. There is  severe eccentric anterior and medially directed mitral regurgitation. The  mitral valve is degenerative. Severe  mitral valve regurgitation.  4. Tricuspid valve regurgitation is mild to moderate.  5. The aortic valve is tricuspid. Aortic valve regurgitation is not  visualized. Moderate aortic valve stenosis. Aortic valve area, by VTI  measures 1.22 cm. AVA by planimetry was 1.31 cm2.  6. Left atrial size was severely dilated.  7. Right ventricular systolic function is normal. The right ventricular  size is normal. There is severely elevated pulmonary artery systolic  pressure.  8. The inferior vena cava is normal in size with <50% respiratory  variability, suggesting right atrial pressure of 8 mmHg.   Patient Profile     87 y.o. female with history of mild CAD, HTN, HLD, memory disorder, peripheral neuropathy, prior CVA, atrial fibrillation and severe mitral regurgitation admitted 07/05/20 with chest pain. Echo 07/06/20 with LVEF=60-65%, moderate LVH, Severe MR. TEE 07/08/20 with severe MR secondary to flail P1/P2. Cardiac cath 07/07/20 with mild CAD  Assessment & Plan    1. Severe mitral regurgitation: Planning outpatient follow up with Dr. Burt Knack to be evaluated for Mitral clip. She may not be a good candidate given overall poor functional status and fragility. Her anatomy seems to be favorable for a clip.   2. CAD: Mild CAD by cath 07/07/20. She has no chest pain.   3. Atrial fibrillation, paroxysmal: Sinus bradycardia today. Continue lower dose of amiodarone. Continue Eliquis.   4. Pericarditis: Continue colchicine.   5. HTN: BP is stable.   6. CKD stage 3b/Acute worsening: Renal function is improving. Will continue to hold her Lisinopril.   Disposition: Working on SNF placement. OK to d/c to SNF when bed available  For  questions or updates, please contact [place] Please consult [email or web address] for contact info under       Signed, Lauree Chandler, MD  07/13/2020, 8:18 AM

## 2020-07-13 NOTE — TOC Progression Note (Addendum)
Transition of Care [facility]) - Progression Note    Patient Details  Name: TIMBERLEE ROBLERO MRN: [id number] Date of Birth: [birth year]  Transition of Care [facility]) CM/SW [place], [place] Phone Number: 07/13/2020, 10:03 AM  Clinical Narrative:    9:13am- CSW spoke with pt son he wanted update on pt dc to SNF, CSW explained that there is no longer availability at [facility] and [facility] is waiting to hear back from [facility] and [facility]. CSW contacted both facilities this morning and they are checking on bed availability and will follow up in a few hours.  4:27pm- [facility] was not able to take pt and did not have any available beds and still waiting on [facility] to return call.   Expected Discharge Plan: [place] Barriers to Discharge: Continued Medical Work up  Expected Discharge Plan and Services Expected Discharge Plan: [place] In-house Referral: Clinical Social Work Discharge Planning Services: NA (CSW)   Living arrangements for the past 2 months: Single Family Home                                       Social Determinants of Health (SDOH) Interventions    Readmission Risk Interventions No flowsheet data found.

## 2020-07-14 LAB — SARS CORONAVIRUS 2 (TAT 6-24 HRS): SARS Coronavirus 2: NEGATIVE

## 2020-07-14 LAB — BASIC METABOLIC PANEL
Anion gap: 5 (ref 5–15)
BUN: 35 mg/dL — ABNORMAL HIGH (ref 8–23)
CO2: 24 mmol/L (ref 22–32)
Calcium: 8.2 mg/dL — ABNORMAL LOW (ref 8.9–10.3)
Chloride: 111 mmol/L (ref 98–111)
Creatinine, Ser: 1.17 mg/dL — ABNORMAL HIGH (ref 0.44–1.00)
GFR, Estimated: 45 mL/min — ABNORMAL LOW (ref >60)
Glucose, Bld: 113 mg/dL — ABNORMAL HIGH (ref 70–99)
Potassium: 3.9 mmol/L (ref 3.5–5.1)
Sodium: 140 mmol/L (ref 135–145)

## 2020-07-14 LAB — CBC
HCT: 39.9 % (ref 36.0–46.0)
Hemoglobin: 12.8 g/dL (ref 12.0–15.0)
MCH: 28.4 pg (ref 26.0–34.0)
MCHC: 32.1 g/dL (ref 30.0–36.0)
MCV: 88.5 fL (ref 80.0–100.0)
Platelets: 333 10*3/uL (ref 150–400)
RBC: 4.51 MIL/uL (ref 3.87–5.11)
RDW: 14.6 % (ref 11.5–15.5)
WBC: 9.1 10*3/uL (ref 4.0–10.5)
nRBC: 0 % (ref 0.0–0.2)

## 2020-07-14 MED ORDER — LISINOPRIL 20 MG PO TABS
20.0000 mg | ORAL_TABLET | Freq: Every day | ORAL | Status: DC
  Administered 2020-07-14 – 2020-07-15 (×2): 20 mg via ORAL
  Filled 2020-07-14 (×2): qty 1

## 2020-07-14 MED ORDER — FUROSEMIDE 20 MG PO TABS
20.0000 mg | ORAL_TABLET | Freq: Every day | ORAL | Status: DC
  Administered 2020-07-14 – 2020-07-15 (×2): 20 mg via ORAL
  Filled 2020-07-14 (×2): qty 1

## 2020-07-14 NOTE — Progress Notes (Signed)
Progress Note  Patient Name: Morgan Boyer Date of Encounter: 07/14/2020  [facility] Cardiologist: Lauree Chandler, MD   Subjective   No events overnight. Pt without complaints this am. No chest pain or dyspnea.   Inpatient Medications    Scheduled Meds: . amiodarone  200 mg Oral Daily  . apixaban  2.5 mg Oral BID  . B-complex with vitamin C  1 tablet Oral Daily  . colchicine  0.6 mg Oral Daily  . gabapentin  300 mg Oral QHS  . Gerhardt's butt cream   Topical BID  . metoprolol tartrate  25 mg Oral BID  . pantoprazole  40 mg Oral BID AC  . simvastatin  20 mg Oral QPM  . sodium chloride flush  3 mL Intravenous Q12H  . sodium chloride flush  3 mL Intravenous Q12H   Continuous Infusions: . sodium chloride 10 mL/hr at 07/08/20 0943  . sodium chloride     PRN Meds: sodium chloride, acetaminophen, loperamide, Muscle Rub, ondansetron (ZOFRAN) IV, polyvinyl alcohol, sodium chloride flush, traMADol   Vital Signs    Vitals:   07/14/20 0000 07/14/20 0400 07/14/20 0500 07/14/20 0754  BP: 119/76 (!) 141/76  (!) 146/88  Pulse: 75 (!) 52  (!) 54  Resp: 16 13  16   Temp: 98 F (36.7 C) 98 F (36.7 C)  98.2 F (36.8 C)  TempSrc: Oral Oral  Oral  SpO2: 96% 98%  99%  Weight:   63.1 kg     Intake/Output Summary (Last 24 hours) at 07/14/2020 0802 Last data filed at 07/13/2020 2200 Gross per 24 hour  Intake --  Output 200 ml  Net -200 ml   Last 3 Weights 07/14/2020 07/12/2020 07/11/2020  Weight (lbs) 139 lb 1.8 oz 140 lb 3.4 oz 144 lb 10 oz  Weight (kg) 63.1 kg 63.6 kg 65.6 kg      Telemetry    Sinus brady - Personally Reviewed  ECG     Physical Exam   General: Thin, frail, elderly female. NAD HEENT: OP clear, mucus membranes moist  SKIN: warm, dry. No rashes. Neuro: No focal deficits  Musculoskeletal: Muscle strength 5/5 all ext  Psychiatric: Mood and affect normal  Neck: No JVD, no carotid bruits, no thyromegaly, no lymphadenopathy.  Lungs:Clear  bilaterally, no wheezes, rhonci, crackles Cardiovascular: Regular, brady. Systolic murmur.  Abdomen:Soft. Bowel sounds present. Non-tender.  Extremities: No lower extremity edema.   Labs    High Sensitivity Troponin:   Recent Labs  Lab 07/05/20 1911 07/06/20 0841 07/06/20 1037  TROPONINIHS 19* 15 16      Chemistry Recent Labs  Lab 07/12/20 0051 07/13/20 0130 07/14/20 0026  NA 137 138 140  K 4.8 4.1 3.9  CL 105 109 111  CO2 19* 21* 24  GLUCOSE 102* 109* 113*  BUN 70* 53* 35*  CREATININE 2.26* 1.68* 1.17*  CALCIUM 8.4* 8.1* 8.2*  GFRNONAA 21* 29* 45*  ANIONGAP 13 8 5      Hematology Recent Labs  Lab 07/09/20 0043 07/13/20 0130 07/14/20 0026  WBC 13.6* 9.1 9.1  RBC 4.85 4.33 4.51  HGB 14.2 12.5 12.8  HCT 44.1 39.1 39.9  MCV 90.9 90.3 88.5  MCH 29.3 28.9 28.4  MCHC 32.2 32.0 32.1  RDW 14.5 14.3 14.6  PLT 231 310 333    BNP No results for input(s): BNP, PROBNP in the last 168 hours.   DDimer No results for input(s): DDIMER in the last 168 hours.   Radiology  No results found.  Cardiac Studies   [facility] 07/07/2020   Ost LAD to Prox LAD lesion is 40% stenosed.  Severe mitral annular calcification.  Mild nonobstructive CAD with 40% smooth proximal LAD stenosis with mild irregularity,and minimal luminal irregularity in the left circumflex and dominant RCA  Moderate right heart pressure elevation with peak V wave in the 36-40 range on the initial PW tracing, consistent with the patient's significant mitral regurgitation.  Very mild aortic stenosis.  RECOMMENDATION: Patient will be undergoing a TEE tentatively scheduled for tomorrow for further evaluation of her mitral regurgitation.  TTE 07/06/2020   1. Left ventricular ejection fraction, by estimation, is 60 to 65%. The  left ventricle has normal function. The left ventricle has no regional  wall motion abnormalities. There is moderate left ventricular hypertrophy.  Left ventricular  diastolic function  could not be evaluated.  2. The pericardial effusion is posterior to the left ventricle.  3. The mitral annulus is calcified and the posterior leafelt appears  restricted at the base and the tip of the leaflet prolapses. There is  severe eccentric anterior and medially directed mitral regurgitation. The  mitral valve is degenerative. Severe  mitral valve regurgitation.  4. Tricuspid valve regurgitation is mild to moderate.  5. The aortic valve is tricuspid. Aortic valve regurgitation is not  visualized. Moderate aortic valve stenosis. Aortic valve area, by VTI  measures 1.22 cm. AVA by planimetry was 1.31 cm2.  6. Left atrial size was severely dilated.  7. Right ventricular systolic function is normal. The right ventricular  size is normal. There is severely elevated pulmonary artery systolic  pressure.  8. The inferior vena cava is normal in size with <50% respiratory  variability, suggesting right atrial pressure of 8 mmHg.   Patient Profile     87 y.o. female with history of mild CAD, HTN, HLD, memory disorder, peripheral neuropathy, prior CVA, atrial fibrillation and severe mitral regurgitation admitted 07/05/20 with chest pain. Echo 07/06/20 with LVEF=60-65%, moderate LVH, Severe MR. TEE 07/08/20 with severe MR secondary to flail P1/P2. Cardiac cath 07/07/20 with mild CAD  Assessment & Plan    1. Severe mitral regurgitation: Planning outpatient follow up with Dr. Burt Knack to be evaluated for Mitral clip. She may not be a good candidate given overall poor functional status and fragility. Her anatomy seems to be favorable for a clip. Will resume Lasix 20 mg daily today.   2. CAD: Mild CAD by cath 07/07/20. She has no chest pain.   3. Atrial fibrillation, paroxysmal: sinus brady today. Continue lower dose of amiodarone. Continue Eliquis.   4. Pericarditis: Continue colchicine for 6 week course.   5. HTN: BP is stable. Renal function is back to baseline. Will resume  Lisinopril at 20 mg daily. (half of home dose).   6. CKD stage 3b/Acute worsening: See above.    Disposition: Working on SNF placement. OK to d/c to SNF when bed available  For questions or updates, please contact [place] Please consult [email or web address] for contact info under       Signed, Lauree Chandler, MD  07/14/2020, 8:02 AM

## 2020-07-14 NOTE — Progress Notes (Signed)
Patient identified as having significant indicators for 12 month mortality including frailty, memory loss, severe MR, symptomatic and functional status decline in setting of advanced age. Potential care planning gaps and palliative care needs related to Advance Care planning and goals of care has been identified. Please consider referral for community based palliative care at discharge or inpatient consultation if primary team feels this is appropriate.  Lane Hacker, DO Palliative Medicine

## 2020-07-14 NOTE — TOC Progression Note (Signed)
Transition of Care [facility]) - Progression Note    Patient Details  Name: Morgan Boyer MRN: [id number] Date of Birth: [birth year]  Transition of Care [facility]) CM/SW [place], [place] Phone Number: 07/14/2020, 2:16 PM  Clinical Narrative:    13:30pm- Lexine Baton at [facility] contacted CSW stating that she has an avalible bed for tomorrow, CSW asked son if he still wanted [facility] he said yes. CSW started insurance auth and requested COVID. Pt pending dc, CSW will follow up.   Expected Discharge Plan: [place] Barriers to Discharge: Continued Medical Work up  Expected Discharge Plan and Services Expected Discharge Plan: [place] In-house Referral: Clinical Social Work Discharge Planning Services: NA (CSW)   Living arrangements for the past 2 months: Single Family Home                                       Social Determinants of Health (SDOH) Interventions    Readmission Risk Interventions No flowsheet data found.

## 2020-07-14 NOTE — Evaluation (Signed)
Occupational Therapy Evaluation Patient Details Name: Morgan Boyer MRN: [id number] DOB: [birth year] Today's Date: 07/14/2020    History of Present Illness 87 yo admitted 4/5 with chest pain and LE edema with [place]. Pt s/p radial heart cath 4/7. PMhx: severe mitral regurgitation, HFpEF, HTN, memory impairment, CVA, HLD, GERD, Afib   Clinical Impression  Pt able to transfer to BSC>bed>chair with min to min guard assist. Instructed to sit in chair for only 1-2 hours to minimize fatigue and ability to return to bed with nursing. VSS on RA throughout    Follow Up Recommendations  SNF;Supervision/Assistance - 24 hour    Equipment Recommendations  None recommended by OT    Recommendations for Other Services       Precautions / Restrictions Precautions Precautions: Fall      Mobility Bed Mobility Overal bed mobility: Needs Assistance Bed Mobility: Supine to Sit     Supine to sit: Min assist     General bed mobility comments: assist to raise trunk, increased time    Transfers Overall transfer level: Needs assistance Equipment used: Rolling walker (2 wheeled) Transfers: Sit to/from [facility] Sit to Stand: Min guard Stand pivot transfers: Min assist       General transfer comment: pt stood from bed x 2 and from [facility] x 1    Balance Overall balance assessment: Needs assistance   Sitting balance-Leahy Scale: Fair     Standing balance support: Bilateral upper extremity supported Standing balance-Leahy Scale: Poor Standing balance comment: reliant on B UE support                           ADL either performed or assessed with clinical judgement   ADL Overall ADL's : Needs assistance/impaired                         Toilet Transfer: Minimal assistance;Stand-pivot;RW;BSC   Toileting- Clothing Manipulation and Hygiene: Total assistance;Sit to/from stand               Vision         Perception     Praxis       Pertinent Vitals/Pain Pain Assessment: No/denies pain     Hand Dominance     Extremity/Trunk Assessment             Communication     Cognition Arousal/Alertness: Awake/alert Behavior During Therapy: Flat affect Overall Cognitive Status: Impaired/Different from baseline Area of Impairment: Following commands;Problem solving                       Following Commands: Follows one step commands with increased time     Problem Solving: Slow processing;Requires verbal cues     General Comments       Exercises     Shoulder Instructions      Home Living                                          Prior Functioning/Environment                   OT Problem List:        OT Treatment/Interventions:      OT Goals(Current goals can be found in the care plan section) Acute Rehab OT Goals Patient Stated Goal: to go  to SNF to improve strength while having assistance available to improve safety OT Goal Formulation: With patient Time For Goal Achievement: 07/25/20 Potential to Achieve Goals: Good  OT Frequency: Min 2X/week   Barriers to D/C:            Co-evaluation              AM-PAC OT "6 Clicks" Daily Activity     Outcome Measure Help from another person eating meals?: None Help from another person taking care of personal grooming?: A Little Help from another person toileting, which includes using toliet, bedpan, or urinal?: A Lot Help from another person bathing (including washing, rinsing, drying)?: A Little Help from another person to put on and taking off regular upper body clothing?: None Help from another person to put on and taking off regular lower body clothing?: A Little 6 Click Score: 19   End of Session Equipment Utilized During Treatment: Rolling walker;Gait belt Nurse Communication: Mobility status  Activity Tolerance: Patient tolerated treatment well Patient left: in chair;with call bell/phone within  reach;with family/visitor present;with chair alarm set  OT Visit Diagnosis: Unsteadiness on feet (R26.81);Other abnormalities of gait and mobility (R26.89);Muscle weakness (generalized) (M62.81)                Time: 3736-6815 OT Time Calculation (min): 34 min Charges:  OT General Charges $OT Visit: 1 Visit OT Treatments $Self Care/Home Management : 23-37 mins  Nestor Lewandowsky, OTR/L Acute Rehabilitation Services Pager: [phone] Office: [phone]  Malka So 07/14/2020, 12:43 PM

## 2020-07-15 ENCOUNTER — Other Ambulatory Visit (HOSPITAL_COMMUNITY): Payer: Self-pay

## 2020-07-15 DIAGNOSIS — E785 Hyperlipidemia, unspecified: Secondary | ICD-10-CM | POA: Diagnosis not present

## 2020-07-15 DIAGNOSIS — I130 Hypertensive heart and chronic kidney disease with heart failure and stage 1 through stage 4 chronic kidney disease, or unspecified chronic kidney disease: Secondary | ICD-10-CM | POA: Diagnosis not present

## 2020-07-15 DIAGNOSIS — I10 Essential (primary) hypertension: Secondary | ICD-10-CM | POA: Diagnosis not present

## 2020-07-15 DIAGNOSIS — I5033 Acute on chronic diastolic (congestive) heart failure: Secondary | ICD-10-CM | POA: Diagnosis not present

## 2020-07-15 DIAGNOSIS — Z515 Encounter for palliative care: Secondary | ICD-10-CM | POA: Diagnosis not present

## 2020-07-15 DIAGNOSIS — R1312 Dysphagia, oropharyngeal phase: Secondary | ICD-10-CM | POA: Diagnosis not present

## 2020-07-15 DIAGNOSIS — I499 Cardiac arrhythmia, unspecified: Secondary | ICD-10-CM | POA: Diagnosis not present

## 2020-07-15 DIAGNOSIS — I480 Paroxysmal atrial fibrillation: Secondary | ICD-10-CM | POA: Diagnosis not present

## 2020-07-15 DIAGNOSIS — I309 Acute pericarditis, unspecified: Secondary | ICD-10-CM | POA: Diagnosis not present

## 2020-07-15 DIAGNOSIS — R0789 Other chest pain: Secondary | ICD-10-CM | POA: Diagnosis not present

## 2020-07-15 DIAGNOSIS — Z96652 Presence of left artificial knee joint: Secondary | ICD-10-CM | POA: Diagnosis not present

## 2020-07-15 DIAGNOSIS — I340 Nonrheumatic mitral (valve) insufficiency: Secondary | ICD-10-CM | POA: Diagnosis not present

## 2020-07-15 DIAGNOSIS — Z833 Family history of diabetes mellitus: Secondary | ICD-10-CM | POA: Diagnosis not present

## 2020-07-15 DIAGNOSIS — Z79899 Other long term (current) drug therapy: Secondary | ICD-10-CM | POA: Diagnosis not present

## 2020-07-15 DIAGNOSIS — R5381 Other malaise: Secondary | ICD-10-CM | POA: Diagnosis not present

## 2020-07-15 DIAGNOSIS — N1832 Chronic kidney disease, stage 3b: Secondary | ICD-10-CM | POA: Diagnosis not present

## 2020-07-15 DIAGNOSIS — R001 Bradycardia, unspecified: Secondary | ICD-10-CM | POA: Diagnosis not present

## 2020-07-15 DIAGNOSIS — G629 Polyneuropathy, unspecified: Secondary | ICD-10-CM | POA: Diagnosis not present

## 2020-07-15 DIAGNOSIS — I4891 Unspecified atrial fibrillation: Secondary | ICD-10-CM | POA: Diagnosis not present

## 2020-07-15 DIAGNOSIS — Z888 Allergy status to other drugs, medicaments and biological substances status: Secondary | ICD-10-CM | POA: Diagnosis not present

## 2020-07-15 DIAGNOSIS — Z8249 Family history of ischemic heart disease and other diseases of the circulatory system: Secondary | ICD-10-CM | POA: Diagnosis not present

## 2020-07-15 DIAGNOSIS — J9601 Acute respiratory failure with hypoxia: Secondary | ICD-10-CM | POA: Diagnosis not present

## 2020-07-15 DIAGNOSIS — M1990 Unspecified osteoarthritis, unspecified site: Secondary | ICD-10-CM | POA: Diagnosis not present

## 2020-07-15 DIAGNOSIS — Z8673 Personal history of transient ischemic attack (TIA), and cerebral infarction without residual deficits: Secondary | ICD-10-CM | POA: Diagnosis not present

## 2020-07-15 DIAGNOSIS — Z20822 Contact with and (suspected) exposure to covid-19: Secondary | ICD-10-CM | POA: Diagnosis not present

## 2020-07-15 DIAGNOSIS — I517 Cardiomegaly: Secondary | ICD-10-CM | POA: Diagnosis not present

## 2020-07-15 DIAGNOSIS — M6281 Muscle weakness (generalized): Secondary | ICD-10-CM | POA: Diagnosis not present

## 2020-07-15 DIAGNOSIS — I319 Disease of pericardium, unspecified: Secondary | ICD-10-CM | POA: Diagnosis not present

## 2020-07-15 DIAGNOSIS — I200 Unstable angina: Secondary | ICD-10-CM | POA: Diagnosis not present

## 2020-07-15 DIAGNOSIS — I5032 Chronic diastolic (congestive) heart failure: Secondary | ICD-10-CM | POA: Diagnosis not present

## 2020-07-15 DIAGNOSIS — K219 Gastro-esophageal reflux disease without esophagitis: Secondary | ICD-10-CM | POA: Diagnosis not present

## 2020-07-15 DIAGNOSIS — E162 Hypoglycemia, unspecified: Secondary | ICD-10-CM | POA: Diagnosis not present

## 2020-07-15 DIAGNOSIS — Z7901 Long term (current) use of anticoagulants: Secondary | ICD-10-CM | POA: Diagnosis not present

## 2020-07-15 DIAGNOSIS — N179 Acute kidney failure, unspecified: Secondary | ICD-10-CM | POA: Diagnosis not present

## 2020-07-15 DIAGNOSIS — I4430 Unspecified atrioventricular block: Secondary | ICD-10-CM | POA: Diagnosis not present

## 2020-07-15 DIAGNOSIS — R41841 Cognitive communication deficit: Secondary | ICD-10-CM | POA: Diagnosis not present

## 2020-07-15 DIAGNOSIS — R262 Difficulty in walking, not elsewhere classified: Secondary | ICD-10-CM | POA: Diagnosis not present

## 2020-07-15 DIAGNOSIS — I69891 Dysphagia following other cerebrovascular disease: Secondary | ICD-10-CM | POA: Diagnosis not present

## 2020-07-15 DIAGNOSIS — Z66 Do not resuscitate: Secondary | ICD-10-CM | POA: Diagnosis not present

## 2020-07-15 DIAGNOSIS — N189 Chronic kidney disease, unspecified: Secondary | ICD-10-CM | POA: Diagnosis not present

## 2020-07-15 DIAGNOSIS — Z882 Allergy status to sulfonamides status: Secondary | ICD-10-CM | POA: Diagnosis not present

## 2020-07-15 DIAGNOSIS — R079 Chest pain, unspecified: Secondary | ICD-10-CM | POA: Diagnosis not present

## 2020-07-15 DIAGNOSIS — I69354 Hemiplegia and hemiparesis following cerebral infarction affecting left non-dominant side: Secondary | ICD-10-CM | POA: Diagnosis not present

## 2020-07-15 DIAGNOSIS — Z85828 Personal history of other malignant neoplasm of skin: Secondary | ICD-10-CM | POA: Diagnosis not present

## 2020-07-15 DIAGNOSIS — I69828 Other speech and language deficits following other cerebrovascular disease: Secondary | ICD-10-CM | POA: Diagnosis not present

## 2020-07-15 DIAGNOSIS — Z823 Family history of stroke: Secondary | ICD-10-CM | POA: Diagnosis not present

## 2020-07-15 DIAGNOSIS — I959 Hypotension, unspecified: Secondary | ICD-10-CM | POA: Diagnosis not present

## 2020-07-15 DIAGNOSIS — I2510 Atherosclerotic heart disease of native coronary artery without angina pectoris: Secondary | ICD-10-CM | POA: Diagnosis not present

## 2020-07-15 DIAGNOSIS — I509 Heart failure, unspecified: Secondary | ICD-10-CM | POA: Diagnosis not present

## 2020-07-15 DIAGNOSIS — I129 Hypertensive chronic kidney disease with stage 1 through stage 4 chronic kidney disease, or unspecified chronic kidney disease: Secondary | ICD-10-CM | POA: Diagnosis not present

## 2020-07-15 DIAGNOSIS — R2681 Unsteadiness on feet: Secondary | ICD-10-CM | POA: Diagnosis not present

## 2020-07-15 MED ORDER — FUROSEMIDE 20 MG PO TABS: 20.0000 mg | ORAL_TABLET | Freq: Every day | ORAL | 3 refills | Status: AC

## 2020-07-15 MED ORDER — LISINOPRIL 20 MG PO TABS: 20.0000 mg | ORAL_TABLET | Freq: Every day | ORAL | 3 refills | Status: AC

## 2020-07-15 MED ORDER — COLCHICINE 0.6 MG PO TABS: 0.6000 mg | ORAL_TABLET | Freq: Every day | ORAL | 0 refills | Status: AC

## 2020-07-15 MED ORDER — METOPROLOL TARTRATE 25 MG PO TABS: 25.0000 mg | ORAL_TABLET | Freq: Two times a day (BID) | ORAL | 3 refills | Status: AC

## 2020-07-15 MED ORDER — APIXABAN 2.5 MG PO TABS: 2.5000 mg | ORAL_TABLET | Freq: Two times a day (BID) | ORAL | 11 refills | Status: AC

## 2020-07-15 MED ORDER — AMIODARONE HCL 200 MG PO TABS: 200.0000 mg | ORAL_TABLET | Freq: Every day | ORAL | 2 refills | Status: AC

## 2020-07-15 NOTE — TOC Progression Note (Addendum)
Transition of Care [facility]) - Progression Note    Patient Details  Name: Morgan Boyer MRN: [id number] Date of Birth: Mar 23, 1932  Transition of Care [facility]) CM/SW Contact  Reece Agar, [place] Phone Number: 07/15/2020, 9:55 AM  Clinical Narrative:    9:45- Stephanie with [employer] contacted CSW to share that the transportation for PTAR was approved (approval# [id number]). Te request for SNF requires a peer to peer by this afternoon with Dr. Lynder Parents at [phone]. CSW will follow up with facility while waiting to hear back from insurance. Covid is negative. Auth was approved ref# [id number] for 5 days, pt daughter is transporting her while her son fills out information at [facility].   Expected Discharge Plan: [place] Barriers to Discharge: Continued Medical Work up  Expected Discharge Plan and Services Expected Discharge Plan: [place] In-house Referral: Clinical Social Work Discharge Planning Services: NA (CSW)   Living arrangements for the past 2 months: Single Family Home                                       Social Determinants of Health (SDOH) Interventions    Readmission Risk Interventions No flowsheet data found.

## 2020-07-15 NOTE — Progress Notes (Signed)
RN went over discharge summary with pt and pt's daughter. NT removed IV. Pt's belongings with pt. Pt's daughter transporting pt to facility. PT transporting pt to private vehicle.

## 2020-07-15 NOTE — TOC Benefit Eligibility Note (Signed)
Patient Teacher, English as a foreign language completed.    The patient is currently admitted and upon discharge could be taking Colchicine 0.6 mg.  The current 30 day co-pay is,$15.00.   The patient is insured through [employer] Part D.     Lyndel Safe, [place] Patient Advocate Specialist [place] Antimicrobial Stewardship Team Direct Number: [phone]  Fax: [phone]

## 2020-07-15 NOTE — Care Management Important Message (Signed)
Important Message  Patient Details  Name: Morgan Boyer MRN: [id number] Date of Birth: [birth year]   Medicare Important Message Given:  Yes - Important Message mailed due to current National Emergency  Verbal consent obtained due to current National Emergency  Relationship to patient: Child Contact Name: [name] Call Date: 07/15/20  Time: 6286 Phone: [phone] Outcome: Spoke with contact Important Message mailed to: Patient address on file    Delorse Lek 07/15/2020, 8:45 AM

## 2020-07-15 NOTE — Progress Notes (Signed)
Physical Therapy Treatment Patient Details Name: Morgan Boyer MRN: [id number] DOB: [birth year] Today's Date: 07/15/2020    History of Present Illness 87 yo admitted 4/5 with chest pain and LE edema with [place]. Pt s/p radial heart cath 4/7. PMhx: severe mitral regurgitation, HFpEF, HTN, memory impairment, CVA, HLD, GERD, Afib    PT Comments    Pt was alert, participative.  Emphasis on engaging participation and transfers over varying distances and complexities.  Pt generally mobile at a minimal assist level  Follow Up Recommendations  SNF;Supervision/Assistance - 24 hour     Equipment Recommendations  3in1 (PT);Wheelchair (measurements PT)    Recommendations for Other Services       Precautions / Restrictions Precautions Precautions: Fall    Mobility  Bed Mobility               General bed mobility comments: up in the recliner on arrival    Transfers Overall transfer level: Needs assistance Equipment used: None Transfers: Sit to/from [facility] Sit to Stand: Min assist Stand pivot transfers: Min assist       General transfer comment: from recliner to transport chair and transport to car transfer.  Assisted in positioning. Car transfer pivot and backup over 5 feet distance.  Ambulation/Gait Ambulation/Gait assistance: Min assist           General Gait Details: short, mildly unsteady, shuffled steps over a long pivoted transfer to the car from transport chair.   Stairs             Wheelchair Mobility    Modified Rankin (Stroke Patients Only)       Balance Overall balance assessment: Needs assistance Sitting-balance support: Feet supported;Bilateral upper extremity supported Sitting balance-Leahy Scale: Fair     Standing balance support: Bilateral upper extremity supported;Single extremity supported;During functional activity Standing balance-Leahy Scale: Poor Standing balance comment: reliant on external support or bil UE  support.                            Cognition Arousal/Alertness: Awake/alert Behavior During Therapy: Flat affect Overall Cognitive Status: Impaired/Different from baseline                     Current Attention Level: Sustained Memory: Decreased short-term memory Following Commands: Follows one step commands with increased time Safety/Judgement: Decreased awareness of safety;Decreased awareness of deficits Awareness: Intellectual Problem Solving: Slow processing;Requires verbal cues        Exercises      General Comments        Pertinent Vitals/Pain Pain Assessment: No/denies pain    Home Living                      Prior Function            PT Goals (current goals can now be found in the care plan section) Acute Rehab PT Goals Patient Stated Goal: to go to SNF to improve strength while having assistance available to improve safety PT Goal Formulation: With patient/family Time For Goal Achievement: 07/23/20 Potential to Achieve Goals: Good Progress towards PT goals: Progressing toward goals    Frequency    Min 2X/week      PT Plan Current plan remains appropriate    Co-evaluation              AM-PAC PT "6 Clicks" Mobility   Outcome Measure  Help needed turning from your back  to your side while in a flat bed without using bedrails?: A Little Help needed moving from lying on your back to sitting on the side of a flat bed without using bedrails?: A Little Help needed moving to and from a bed to a chair (including a wheelchair)?: A Little Help needed standing up from a chair using your arms (e.g., wheelchair or bedside chair)?: A Little Help needed to walk in hospital room?: A Little Help needed climbing 3-5 steps with a railing? : A Lot 6 Click Score: 17    End of Session   Activity Tolerance: Patient tolerated treatment well Patient left: Other (comment) (car with family for transport to [facility].) Nurse  Communication: Mobility status PT Visit Diagnosis: Other abnormalities of gait and mobility (R26.89)     Time: 7741-2878 PT Time Calculation (min) (ACUTE ONLY): 21 min  Charges:  $Therapeutic Activity: 8-22 mins                     07/15/2020  Ginger Carne., PT Acute Rehabilitation Services [phone]  (pager) [phone]  (office)   Tessie Fass Amberlee Garvey 07/15/2020, 4:28 PM

## 2020-07-15 NOTE — Plan of Care (Signed)
  Problem: Health Behavior/Discharge Planning: Goal: Ability to manage health-related needs will improve Outcome: Progressing   Problem: Clinical Measurements: Goal: Ability to maintain clinical measurements within normal limits will improve Outcome: Progressing Goal: Will remain free from infection Outcome: Progressing Goal: Diagnostic test results will improve Outcome: Progressing Goal: Respiratory complications will improve Outcome: Progressing Goal: Cardiovascular complication will be avoided Outcome: Progressing   Problem: Coping: Goal: Level of anxiety will decrease Outcome: Progressing   Problem: Elimination: Goal: Will not experience complications related to bowel motility Outcome: Progressing Goal: Will not experience complications related to urinary retention Outcome: Progressing   Problem: Pain Managment: Goal: General experience of comfort will improve Description: General experience of comfort will improve Outcome: Progressing   Problem: Safety: Goal: Ability to remain free from injury will improve Outcome: Progressing   Problem: Skin Integrity: Goal: Risk for impaired skin integrity will decrease Outcome: Progressing

## 2020-07-15 NOTE — Discharge Summary (Addendum)
Discharge Summary    Patient ID: Morgan Boyer MRN: [id number]; DOB: 10/25/1931  Admit date: 07/05/2020 Discharge date: 07/15/2020  PCP:  Aretta Nip, MD   [facility] Cardiologist: Lauree Chandler, MD   Discharge Diagnoses    Principal Problem:   Unstable angina [facility]) Active Problems:   Memory changes   Essential hypertension   Chest pain at rest   Paroxysmal atrial fibrillation (HCC)   Severe mitral regurgitation   Congestive heart failure (CHF) ([place]) Pericarditis Deconditioning   Diagnostic Studies/Procedures    TTE 07/06/2020  1. Left ventricular ejection fraction, by estimation, is 60 to 65%. The  left ventricle has normal function. The left ventricle has no regional  wall motion abnormalities. There is moderate left ventricular hypertrophy.  Left ventricular diastolic function   could not be evaluated.   2. The pericardial effusion is posterior to the left ventricle.   3. The mitral annulus is calcified and the posterior leafelt appears  restricted at the base and the tip of the leaflet prolapses. There is  severe eccentric anterior and medially directed mitral regurgitation. The  mitral valve is degenerative. Severe  mitral valve regurgitation.   4. Tricuspid valve regurgitation is mild to moderate.   5. The aortic valve is tricuspid. Aortic valve regurgitation is not  visualized. Moderate aortic valve stenosis. Aortic valve area, by VTI  measures 1.22 cm. AVA by planimetry was 1.31 cm2.   6. Left atrial size was severely dilated.   7. Right ventricular systolic function is normal. The right ventricular  size is normal. There is severely elevated pulmonary artery systolic  pressure.   8. The inferior vena cava is normal in size with <50% respiratory  variability, suggesting right atrial pressure of 8 mmHg.   LHC 07/07/2020   Ost LAD to Prox LAD lesion is 40% stenosed.   Severe mitral annular  calcification.   Mild nonobstructive CAD with 40% smooth proximal LAD stenosis with mild irregularity,and minimal luminal irregularity in the left circumflex and  dominant RCA   Moderate right heart pressure elevation with peak V wave in the 36-40 range on the initial PW tracing, consistent with the patient's significant mitral regurgitation.   Very mild aortic stenosis.   RECOMMENDATION: Patient will be undergoing a TEE tentatively scheduled for tomorrow for further evaluation of her mitral regurgitation.    TEE 07/08/20 1. Left ventricular ejection fraction, by estimation, is 60 to 65%. The  left ventricle has normal function. The left ventricle has no regional  wall motion abnormalities. There is the interventricular septum is  flattened in systole and diastole,  consistent with right ventricular pressure and volume overload.   2. Right ventricular systolic function is normal. The right ventricular  size is normal. There is severely elevated pulmonary artery systolic  pressure. The estimated right ventricular systolic pressure is 40.9 mmHg.   3. Left atrial size was severely dilated. No left atrial/left atrial  appendage thrombus was detected.   4. Right atrial size was severely dilated.   5. A small pericardial effusion is present.   6. Flail P1/P2 with severe anterior eccentric MR jet.. The mitral valve  is degenerative. Severe mitral valve regurgitation. The mean mitral valve  gradient is 3.0 mmHg. Moderate mitral annular calcification.   7. Tricuspid valve regurgitation is moderate to severe.   8. The aortic valve is tricuspid. There is mild calcification of the  aortic valve. There is mild thickening of the aortic valve.  Aortic valve  regurgitation is not visualized. No aortic stenosis is present.   9. There is Moderate (Grade III) plaque involving the transverse and  descending aorta.   Conclusion(s)/Recommendation(s): Severe eccentric MR with flail P1/P2  segments.  Moderate to (likely) severe tricuspid regurgitation with  severely elevated RVSP.   History of Present Illness     Morgan Boyer is a 87 y.o. female with HTN, HLD, memory disorder, peripheral neuropathy, OA GERD, depression, CVA, atrial fibrillation and moderate to severe mitral regurgitation who was admitted from clinic.  She has a hx of acute CVA in 10/2018 and was given tPA. 2D echo at that time showed normal LVF with moderate MR and TR and TEE noted partially flail leaflet with severe MR.  She was found also to have PAF by loop recorder and is on Eliquis.  Last seen by Dr. Angelena Form in march 2021 and was having LE edema with echo showing normal LVF with G3DD and severe PHTN and moderate to severe MR.       She presented to clinic 07/05/2020 with complains of 24 hours of chest pain.  This started night prior and at its worst was a 10/10 in severity with radiation into the back.  She was nauseated with the pain but no diaphoresis or SOB.  She could not get comfortable all night due to the pain.  This am she still was having CP although improved to a 6/10 and presented to office for evaluation.  She also has been having significant LE edema.  EKG does not show any acute ST changes but her symptoms are concerning.  Recommend admission to tele bed at [facility].    Hospital Course     Consultants: None  1. Unstable Angina/CAD -Admitted with unstable angina, Eliquis held and started on heparin LHC showed 40% ostial LCX. No significant CAD.  Heparin stopped.  Treated with aspirin and statin.   2.   Pericarditis  Initially felt that her chest pain is atypical and could be due to acid reflux related versus related to her mitral regurgitation.  However patient continues to have ongoing chest pain.  Eventually patient had inflammation markers checked on 4/8 showing CRP of 18.7 and sed rate of 35.  Patient is started on colchicine with improved symptoms.  She will complete 6-week course of colchicine..   3. Afib  with RVR -Patient had intermittent episode of A. fib RVR during admission.  She was treated with IV amiodarone and later transitioned to oral.  Eliquis restarted.  Continue beta-blocker. -Maintaining sinus rhythm.   4. Severe MR/Flailed PMVL, moderate to severe pHTN/HFpEF - large V waves on right heart cath -LVEDP 12 mmHG -appears euvolemic -TEE 07/08/20 with severe MR secondary to flail P1/P2.  -She was treated with IV Lasix. - Plan to follow-up with Dr. Burt Knack in outpatient setting for MitraClip evaluation. She may not be a good candidate given overall poor functional status and fragility. Her anatomy seems to be favorable for a clip.  5. HTN - BP stable -continue lisinopril  20 mg daily and metoprolol    6. Memory impairment/CVA -She is active and independent per her son.  She was driving prior to admission.  Episodes of delirious during admission. Evaluated by PT and OT with SNF recommendation.  7.  Acute on CKD  Stage 3 B -At time peaked at 2.33 but stabilized at baseline -Creatinine of 1.17 at discharge  The patient been seen by Dr. Angelena Form today and deemed ready for discharge  home. All follow-up appointments have been scheduled. Discharge medications are listed below.   Did the patient have an acute coronary syndrome (MI, NSTEMI, STEMI, etc) this admission?:  No                               Did the patient have a percutaneous coronary intervention (stent / angioplasty)?:  No.       Discharge Vitals Blood pressure (!) 146/93, pulse (!) 52, temperature 98 F (36.7 C), temperature source Oral, resp. rate 15, weight 63.1 kg, SpO2 98 %.  Filed Weights   07/11/20 0443 07/12/20 0300 07/14/20 0500  Weight: 65.6 kg 63.6 kg 63.1 kg    Labs & Radiologic Studies    CBC Recent Labs    07/13/20 0130 07/14/20 0026  WBC 9.1 9.1  HGB 12.5 12.8  HCT 39.1 39.9  MCV 90.3 88.5  PLT 310 357   Basic Metabolic Panel Recent Labs    07/13/20 0130 07/14/20 0026  NA 138 140  K 4.1  3.9  CL 109 111  CO2 21* 24  GLUCOSE 109* 113*  BUN 53* 35*  CREATININE 1.68* 1.17*  CALCIUM 8.1* 8.2*   High Sensitivity Troponin:   Recent Labs  Lab 07/05/20 1911 07/06/20 0841 07/06/20 1037  TROPONINIHS 19* 15 16    _____________  CARDIAC CATHETERIZATION  Result Date: 07/07/2020  Ost LAD to Prox LAD lesion is 40% stenosed.  Severe mitral annular calcification. Mild nonobstructive CAD with 40% smooth proximal LAD stenosis with mild irregularity,and minimal luminal irregularity in the left circumflex and  dominant RCA Moderate right heart pressure elevation with peak V wave in the 36-40 range on the initial PW tracing, consistent with the patient's significant mitral regurgitation. Very mild aortic stenosis. RECOMMENDATION: Patient will be undergoing a TEE tentatively scheduled for tomorrow for further evaluation of her mitral regurgitation.   Portable chest x-ray 1 view  Result Date: 07/05/2020 CLINICAL DATA:  Chest pain at rest EXAM: PORTABLE CHEST 1 VIEW COMPARISON:  12/01/2015 FINDINGS: Cardiac enlargement. No vascular congestion, edema, or consolidation. Calcification in the mitral valve annulus. Loop recorder. Calcification of the aorta. No pleural effusions. No pneumothorax. Mediastinal contours appear intact. IMPRESSION: Cardiac enlargement. No evidence of active pulmonary disease. Electronically Signed   By: Lucienne Capers M.D.   On: 07/05/2020 19:27   ECHOCARDIOGRAM COMPLETE  Result Date: 07/06/2020    ECHOCARDIOGRAM REPORT   Patient Name:   Morgan Boyer Date of Exam: 07/06/2020 Medical Rec #:  [id number]       Height:       62.0 in Accession #:    [id number]      Weight:       143.5 lb Date of Birth:  Nov 27, 1931        BSA:          1.660 m Patient Age:    84 years        BP:           110/61 mmHg Patient Gender: F               HR:           68 bpm. Exam Location:  Inpatient Procedure: 2D Echo, Cardiac Doppler and Color Doppler Indications:    Mitral valve disorder  History:         Patient has prior history of Echocardiogram examinations, most  recent 07/22/2019. Mitral Valve Disease, Arrythmias:Atrial                 Fibrillation; Risk Factors:Hypertension and Dyslipidemia.  Sonographer:    Clayton Lefort RDCS (AE) Referring Phys: [place]  1. Left ventricular ejection fraction, by estimation, is 60 to 65%. The left ventricle has normal function. The left ventricle has no regional wall motion abnormalities. There is moderate left ventricular hypertrophy. Left ventricular diastolic function  could not be evaluated.  2. The pericardial effusion is posterior to the left ventricle.  3. The mitral annulus is calcified and the posterior leafelt appears restricted at the base and the tip of the leaflet prolapses. There is severe eccentric anterior and medially directed mitral regurgitation. The mitral valve is degenerative. Severe mitral valve regurgitation.  4. Tricuspid valve regurgitation is mild to moderate.  5. The aortic valve is tricuspid. Aortic valve regurgitation is not visualized. Moderate aortic valve stenosis. Aortic valve area, by VTI measures 1.22 cm. AVA by planimetry was 1.31 cm2.  6. Left atrial size was severely dilated.  7. Right ventricular systolic function is normal. The right ventricular size is normal. There is severely elevated pulmonary artery systolic pressure.  8. The inferior vena cava is normal in size with <50% respiratory variability, suggesting right atrial pressure of 8 mmHg. Comparison(s): Changes from prior study are noted. 07/22/2019: LVEF 55-60%, moderate to severe MR. FINDINGS  Left Ventricle: Left ventricular ejection fraction, by estimation, is 60 to 65%. The left ventricle has normal function. The left ventricle has no regional wall motion abnormalities. The left ventricular internal cavity size was normal in size. There is  moderate left ventricular hypertrophy. Left ventricular diastolic function could not be evaluated  due to mitral annular calcification (moderate or greater). Left ventricular diastolic function could not be evaluated. Right Ventricle: The right ventricular size is normal. No increase in right ventricular wall thickness. Right ventricular systolic function is normal. There is severely elevated pulmonary artery systolic pressure. The tricuspid regurgitant velocity is 3.87 m/s, and with an assumed right atrial pressure of 8 mmHg, the estimated right ventricular systolic pressure is 37.1 mmHg. Left Atrium: Left atrial size was severely dilated. Right Atrium: Right atrial size was normal in size. Pericardium: Trivial pericardial effusion is present. The pericardial effusion is posterior to the left ventricle. Mitral Valve: The mitral annulus is calcified and the posterior leafelt appears restricted at the base and the tip of the leaflet prolapses. There is severe eccentric anterior and medially directed mitral regurgitation. The mitral valve is degenerative in appearance. Mild to moderate mitral annular calcification. Severe mitral valve regurgitation, with anteriorly-directed jet. MV peak gradient, 13.2 mmHg. The mean mitral valve gradient is 2.0 mmHg. Tricuspid Valve: The tricuspid valve is not well visualized. Tricuspid valve regurgitation is mild to moderate. Aortic Valve: The aortic valve is tricuspid. Aortic valve regurgitation is not visualized. Moderate aortic stenosis is present. Aortic valve mean gradient measures 8.0 mmHg. Aortic valve peak gradient measures 14.6 mmHg. Aortic valve area, by VTI measures 1.22 cm. Pulmonic Valve: The pulmonic valve was normal in structure. Pulmonic valve regurgitation is not visualized. Aorta: The aortic root and ascending aorta are structurally normal, with no evidence of dilitation. Venous: The inferior vena cava is normal in size with less than 50% respiratory variability, suggesting right atrial pressure of 8 mmHg. IAS/Shunts: No atrial level shunt detected by color flow  Doppler.  LEFT VENTRICLE PLAX 2D LVIDd:  4.70 cm LVIDs:         2.80 cm LV PW:         1.50 cm LV IVS:        1.40 cm LVOT diam:     1.90 cm LV SV:         39 LV SV Index:   23 LVOT Area:     2.84 cm  RIGHT VENTRICLE            IVC RV Basal diam:  3.40 cm    IVC diam: 1.80 cm RV S prime:     7.01 cm/s TAPSE (M-mode): 1.7 cm LEFT ATRIUM           Index       RIGHT ATRIUM           Index LA diam:      4.00 cm 2.41 cm/m  RA Area:     12.80 cm LA Vol (A4C): 99.6 ml 59.99 ml/m RA Volume:   25.30 ml  15.24 ml/m  AORTIC VALVE AV Area (Vmax):    1.21 cm AV Area (Vmean):   1.16 cm AV Area (VTI):     1.22 cm AV Vmax:           191.00 cm/s AV Vmean:          130.000 cm/s AV VTI:            0.315 m AV Peak Grad:      14.6 mmHg AV Mean Grad:      8.0 mmHg LVOT Vmax:         81.60 cm/s LVOT Vmean:        53.200 cm/s LVOT VTI:          0.136 m LVOT/AV VTI ratio: 0.43  AORTA Ao Root diam: 3.30 cm Ao Asc diam:  3.40 cm MITRAL VALVE                 TRICUSPID VALVE MV Area (PHT): 2.97 cm      TR Peak grad:   59.9 mmHg MV Peak grad:  13.2 mmHg     TR Vmax:        387.00 cm/s MV Mean grad:  2.0 mmHg MV Vmax:       1.82 m/s      SHUNTS MV Vmean:      67.7 cm/s     Systemic VTI:  0.14 m MR Peak grad:    92.9 mmHg   Systemic Diam: 1.90 cm MR Mean grad:    56.0 mmHg MR Vmax:         482.00 cm/s MR Vmean:        349.0 cm/s MR PISA:         6.28 cm MR PISA Eff ROA: 52 mm MR PISA Radius:  1.00 cm Lyman Bishop MD Electronically signed by Lyman Bishop MD Signature Date/Time: 07/06/2020/4:20:38 PM    Final    ECHO TEE  Result Date: 07/08/2020    TRANSESOPHOGEAL ECHO REPORT   Patient Name:   Morgan Boyer Date of Exam: 07/08/2020 Medical Rec #:  [id number]       Height:       62.0 in Accession #:    [id number]      Weight:       142.0 lb Date of Birth:  1931/09/25        BSA:          1.653 m Patient Age:  88 years        BP:           143/91 mmHg Patient Gender: F               HR:           92 bpm. Exam Location:  Inpatient  Procedure: Transesophageal Echo, Color Doppler, Cardiac Doppler and 3D Echo Indications:     Mitral Regurgitation  History:         Patient has prior history of Echocardiogram examinations, most                  recent 07/06/2020. Arrythmias:Atrial Fibrillation; Risk                  Factors:Dyslipidemia and Hypertension.  Sonographer:     Mikki Santee RDCS (AE) Referring Phys:  [place] Diagnosing Phys: Buford Dresser MD PROCEDURE: After discussion of the risks and benefits of a TEE, an informed consent was obtained from the patient. The transesophogeal probe was passed without difficulty through the esophogus of the patient. Sedation performed by different physician. The patient was monitored while under deep sedation. Anesthestetic sedation was provided intravenously by Anesthesiology: 234.56mg  of Propofol, 60mg  of Lidocaine. Image quality was good. The patient developed no complications during the procedure. IMPRESSIONS  1. Left ventricular ejection fraction, by estimation, is 60 to 65%. The left ventricle has normal function. The left ventricle has no regional wall motion abnormalities. There is the interventricular septum is flattened in systole and diastole, consistent with right ventricular pressure and volume overload.  2. Right ventricular systolic function is normal. The right ventricular size is normal. There is severely elevated pulmonary artery systolic pressure. The estimated right ventricular systolic pressure is 13.2 mmHg.  3. Left atrial size was severely dilated. No left atrial/left atrial appendage thrombus was detected.  4. Right atrial size was severely dilated.  5. A small pericardial effusion is present.  6. Flail P1/P2 with severe anterior eccentric MR jet.. The mitral valve is degenerative. Severe mitral valve regurgitation. The mean mitral valve gradient is 3.0 mmHg. Moderate mitral annular calcification.  7. Tricuspid valve regurgitation is moderate to severe.  8.  The aortic valve is tricuspid. There is mild calcification of the aortic valve. There is mild thickening of the aortic valve. Aortic valve regurgitation is not visualized. No aortic stenosis is present.  9. There is Moderate (Grade III) plaque involving the transverse and descending aorta. Conclusion(s)/Recommendation(s): Severe eccentric MR with flail P1/P2 segments. Moderate to (likely) severe tricuspid regurgitation with severely elevated RVSP. FINDINGS  Left Ventricle: Left ventricular ejection fraction, by estimation, is 60 to 65%. The left ventricle has normal function. The left ventricle has no regional wall motion abnormalities. The left ventricular internal cavity size was normal in size. The interventricular septum is flattened in systole and diastole, consistent with right ventricular pressure and volume overload. Right Ventricle: The right ventricular size is normal. No increase in right ventricular wall thickness. Right ventricular systolic function is normal. There is severely elevated pulmonary artery systolic pressure. The tricuspid regurgitant velocity is 3.74 m/s, and with an assumed right atrial pressure of 8 mmHg, the estimated right ventricular systolic pressure is 44.0 mmHg. Left Atrium: Left atrial size was severely dilated. No left atrial/left atrial appendage thrombus was detected. Right Atrium: Right atrial size was severely dilated. Pericardium: A small pericardial effusion is present. Mitral Valve: Flail P1/P2 with severe anterior eccentric MR jet. The mitral valve is degenerative  in appearance. There is mild thickening of the mitral valve leaflet(s). There is mild calcification of the mitral valve leaflet(s). Moderate mitral annular calcification. Severe mitral valve regurgitation. MV peak gradient, 8.9 mmHg. The mean mitral valve gradient is 3.0 mmHg. Tricuspid Valve: The tricuspid valve is normal in structure. Tricuspid valve regurgitation is moderate to severe. No evidence of  tricuspid stenosis. Aortic Valve: The aortic valve is tricuspid. There is mild calcification of the aortic valve. There is mild thickening of the aortic valve. There is moderate aortic valve annular calcification. Aortic valve regurgitation is not visualized. No aortic stenosis is present. Pulmonic Valve: The pulmonic valve was normal in structure. Pulmonic valve regurgitation is trivial. Aorta: The aortic root and ascending aorta are structurally normal, with no evidence of dilitation. There is moderate (Grade III) plaque involving the transverse and descending aorta. IAS/Shunts: No atrial level shunt detected by color flow Doppler.  AORTIC VALVE LVOT Vmax:   81.90 cm/s LVOT Vmean:  47.100 cm/s LVOT VTI:    0.106 m MITRAL VALVE                 TRICUSPID VALVE MV Peak grad: 8.9 mmHg       TR Peak grad:   56.0 mmHg MV Mean grad: 3.0 mmHg       TR Vmax:        374.00 cm/s MV Vmax:      1.49 m/s MV Vmean:     75.5 cm/s      SHUNTS MR Peak grad:    75.0 mmHg   Systemic VTI: 0.11 m MR Mean grad:    49.0 mmHg MR Vmax:         433.00 cm/s MR Vmean:        334.0 cm/s MR PISA:         6.28 cm MR PISA Eff ROA: 56 mm MR PISA Radius:  1.00 cm Buford Dresser MD Electronically signed by Buford Dresser MD Signature Date/Time: 07/08/2020/6:24:57 PM    Final    CUP PACEART REMOTE DEVICE CHECK  Result Date: 06/25/2020 ILR summary report received. Battery status OK. Normal device function. 2 AF events longest noted duration 4 minutes, available ECG appears a fib.  Known PAF, OAC - Eliquis.  No new symptom, tachy, brady, or pause episodes. . Monthly summary reports and ROV/PRN.  R. Powers, CVRS  Disposition   Pt is being discharged home today in good condition.  Follow-up Plans & Appointments     Follow-up Information     Sherren Mocha, MD. Go on 08/01/2020.   Specialty: Cardiology Why: @ 11:20am for mitral clip evaluation.  Please arrive 15 minutes early. Contact information: [address] [place] [address] [phone]                Discharge Instructions     Diet - low sodium heart healthy   Complete by: As directed    Increase activity slowly   Complete by: As directed        Discharge Medications   Allergies as of 07/15/2020       Reactions   Septra [sulfamethoxazole-trimethoprim] Hives   Lotensin [benazepril Hcl] Other (See Comments)   NOT KNOWN        Medication List     TAKE these medications    acetaminophen 325 MG tablet Commonly known as: TYLENOL Take 1-2 tablets (325-650 mg total) by mouth every 4 (four) hours as needed for mild pain.   amiodarone  200 MG tablet Commonly known as: PACERONE Take 1 tablet (200 mg total) by mouth daily. Start taking on: July 16, 2020   apixaban 2.5 MG Tabs tablet Commonly known as: ELIQUIS Take 1 tablet (2.5 mg total) by mouth 2 (two) times daily. What changed:  medication strength how much to take   b complex vitamins tablet Take 1 tablet by mouth daily.   Baclofen 5 MG Tabs Take 5 mg by mouth at bedtime.   colchicine 0.6 MG tablet Take 1 tablet (0.6 mg total) by mouth daily. Start taking on: July 16, 2020   esomeprazole 20 MG capsule Commonly known as: NEXIUM Take 20 mg by mouth daily.   furosemide 20 MG tablet Commonly known as: LASIX Take 1 tablet (20 mg total) by mouth daily. What changed: how much to take   gabapentin 300 MG capsule Commonly known as: Neurontin Take 1 capsule (300 mg total) by mouth at bedtime.   hydrocortisone 2.5 % cream Apply 1 application topically daily as needed (rash).   lisinopril 20 MG tablet Commonly known as: ZESTRIL Take 1 tablet (20 mg total) by mouth daily. What changed:  when to take this additional instructions   metoprolol tartrate 25 MG tablet Commonly known as: LOPRESSOR Take 1 tablet (25 mg total) by mouth 2 (two) times daily. What changed: See the new instructions.   simvastatin 20 MG tablet Commonly  known as: ZOCOR Take 20 mg by mouth every evening.   SYSTANE OP Place 1 drop into both eyes 3 (three) times daily as needed (for dry eyes).           Outstanding Labs/Studies   BMP at follow up   Duration of Discharge Encounter   Greater than 30 minutes including physician time.  Jarrett Soho, PA 07/15/2020, 2:31 PM  I have personally seen and examined this patient. I agree with the assessment and plan as outlined above. .See my full note from this am.   Lauree Chandler 07/15/2020 2:49 PM

## 2020-07-15 NOTE — TOC Transition Note (Addendum)
Transition of Care [facility]) - CM/SW Discharge Note   Patient Details  Name: Morgan Boyer MRN: [id number] Date of Birth: [birth year]  Transition of Care [facility]) CM/SW Contact:  Tresa Endo Phone Number: 07/15/2020, 1:41 PM   Clinical Narrative:    Patient will DC to: [facility] Anticipated DC date: 07/15/2020 Family notified: Son Transport by: Son    Per MD patient ready for DC to [facility] room 108. RN to call report prior to discharge [phone]). RN, patient, patient's family, and facility notified of DC. Discharge Summary and FL2 sent to facility. DC packet on chart. Ambulance transport requested for patient.   CSW will sign off for now as social work intervention is no longer needed. Please consult [place] again if new needs arise.      Final next level of care: Skilled Nursing Facility Barriers to Discharge: Continued Medical Work up   Patient Goals and CMS Choice Patient states their goals for this hospitalization and ongoing recovery are:: Rehab CMS Medicare.gov Compare Post Acute Care list provided to:: Patient Choice offered to / list presented to : [place]  Discharge Placement                       Discharge Plan and Services In-house Referral: Clinical Social Work Discharge Planning Services: NA (CSW)                                 Social Determinants of Health (SDOH) Interventions     Readmission Risk Interventions No flowsheet data found.

## 2020-07-15 NOTE — Progress Notes (Addendum)
Progress Note  Patient Name: Morgan Boyer Date of Encounter: 07/15/2020  [facility] Cardiologist: Lauree Chandler, MD   Subjective   No chest pain or dyspnea  Inpatient Medications    Scheduled Meds: . amiodarone  200 mg Oral Daily  . apixaban  2.5 mg Oral BID  . B-complex with vitamin C  1 tablet Oral Daily  . colchicine  0.6 mg Oral Daily  . furosemide  20 mg Oral Daily  . gabapentin  300 mg Oral QHS  . Gerhardt's butt cream   Topical BID  . lisinopril  20 mg Oral Daily  . metoprolol tartrate  25 mg Oral BID  . pantoprazole  40 mg Oral BID AC  . simvastatin  20 mg Oral QPM  . sodium chloride flush  3 mL Intravenous Q12H  . sodium chloride flush  3 mL Intravenous Q12H   Continuous Infusions: . sodium chloride 10 mL/hr at 07/08/20 0943  . sodium chloride     PRN Meds: sodium chloride, acetaminophen, loperamide, Muscle Rub, ondansetron (ZOFRAN) IV, polyvinyl alcohol, sodium chloride flush, traMADol   Vital Signs    Vitals:   07/14/20 1939 07/14/20 2344 07/15/20 0349 07/15/20 0812  BP: (!) 135/97 (!) 141/89 121/83 (!) 148/80  Pulse:  (!) 49  (!) 55  Resp:  15    Temp: (!) 97.4 F (36.3 C) 97.8 F (36.6 C) (!) 97.5 F (36.4 C)   TempSrc: Oral Oral Oral   SpO2:  96%  97%  Weight:        Intake/Output Summary (Last 24 hours) at 07/15/2020 0837 Last data filed at 07/14/2020 1600 Gross per 24 hour  Intake 480 ml  Output 200 ml  Net 280 ml   Last 3 Weights 07/14/2020 07/12/2020 07/11/2020  Weight (lbs) 139 lb 1.8 oz 140 lb 3.4 oz 144 lb 10 oz  Weight (kg) 63.1 kg 63.6 kg 65.6 kg      Telemetry    Sinus brady - Personally Reviewed  ECG     Physical Exam   General: Thin elderly female HEENT: OP clear, mucus membranes moist  SKIN: warm, dry. No rashes. Neuro: No focal deficits  Musculoskeletal: Muscle strength 5/5 all ext  Psychiatric: Mood and affect normal  Neck: No JVD, no carotid bruits, no thyromegaly, no lymphadenopathy.  Lungs:Clear  bilaterally, no wheezes, rhonci, crackles Cardiovascular: Regular, brady. Systolic murmur.  Abdomen:Soft. Bowel sounds present. Non-tender.  Extremities: No lower extremity edema. Pulses are 2 + in the bilateral DP/PT.  Labs    High Sensitivity Troponin:   Recent Labs  Lab 07/05/20 1911 07/06/20 0841 07/06/20 1037  TROPONINIHS 19* 15 16      Chemistry Recent Labs  Lab 07/12/20 0051 07/13/20 0130 07/14/20 0026  NA 137 138 140  K 4.8 4.1 3.9  CL 105 109 111  CO2 19* 21* 24  GLUCOSE 102* 109* 113*  BUN 70* 53* 35*  CREATININE 2.26* 1.68* 1.17*  CALCIUM 8.4* 8.1* 8.2*  GFRNONAA 21* 29* 45*  ANIONGAP 13 8 5      Hematology Recent Labs  Lab 07/09/20 0043 07/13/20 0130 07/14/20 0026  WBC 13.6* 9.1 9.1  RBC 4.85 4.33 4.51  HGB 14.2 12.5 12.8  HCT 44.1 39.1 39.9  MCV 90.9 90.3 88.5  MCH 29.3 28.9 28.4  MCHC 32.2 32.0 32.1  RDW 14.5 14.3 14.6  PLT 231 310 333    BNP No results for input(s): BNP, PROBNP in the last 168 hours.   DDimer No  results for input(s): DDIMER in the last 168 hours.   Radiology    No results found.  Cardiac Studies   [facility] 07/07/2020   Ost LAD to Prox LAD lesion is 40% stenosed.  Severe mitral annular calcification.  Mild nonobstructive CAD with 40% smooth proximal LAD stenosis with mild irregularity,and minimal luminal irregularity in the left circumflex and dominant RCA  Moderate right heart pressure elevation with peak V wave in the 36-40 range on the initial PW tracing, consistent with the patient's significant mitral regurgitation.  Very mild aortic stenosis.  RECOMMENDATION: Patient will be undergoing a TEE tentatively scheduled for tomorrow for further evaluation of her mitral regurgitation.  TTE 07/06/2020   1. Left ventricular ejection fraction, by estimation, is 60 to 65%. The  left ventricle has normal function. The left ventricle has no regional  wall motion abnormalities. There is moderate left ventricular  hypertrophy.  Left ventricular diastolic function  could not be evaluated.  2. The pericardial effusion is posterior to the left ventricle.  3. The mitral annulus is calcified and the posterior leafelt appears  restricted at the base and the tip of the leaflet prolapses. There is  severe eccentric anterior and medially directed mitral regurgitation. The  mitral valve is degenerative. Severe  mitral valve regurgitation.  4. Tricuspid valve regurgitation is mild to moderate.  5. The aortic valve is tricuspid. Aortic valve regurgitation is not  visualized. Moderate aortic valve stenosis. Aortic valve area, by VTI  measures 1.22 cm. AVA by planimetry was 1.31 cm2.  6. Left atrial size was severely dilated.  7. Right ventricular systolic function is normal. The right ventricular  size is normal. There is severely elevated pulmonary artery systolic  pressure.  8. The inferior vena cava is normal in size with <50% respiratory  variability, suggesting right atrial pressure of 8 mmHg.   Patient Profile     87 y.o. female with history of mild CAD, HTN, HLD, memory disorder, peripheral neuropathy, prior CVA, atrial fibrillation and severe mitral regurgitation admitted 07/05/20 with chest pain. Echo 07/06/20 with LVEF=60-65%, moderate LVH, Severe MR. TEE 07/08/20 with severe MR secondary to flail P1/P2. Cardiac cath 07/07/20 with mild CAD  Assessment & Plan    1. Severe mitral regurgitation: Planning outpatient follow up with Dr. Burt Knack to be evaluated for Mitral clip. She may not be a good candidate given overall poor functional status and fragility. Her anatomy seems to be favorable for a clip. Will resume Lasix 20 mg daily today.   2. CAD: Mild CAD by cath 07/07/20. No chest pain.   3. Atrial fibrillation, paroxysmal: sinus brady today. Continue amiodarone and Eliquis.    4. Pericarditis: Continue colchicine for 6 week course.   5. HTN: BP is stable. Renal function is back to baseline.  Continue Lisinopril 20 mg po daily. (change from home dose of 20 mg BID)  6. CKD stage 3b: Renal function back to baseline  Disposition: discharge to SNF. Bed should be ready today. I have reviewed the plan with her son by phone. She may not show any improvement over the next few weeks clinically and may need to move toward a palliative care/Hospice approach. Will ask palliative care team to see today. I spoke to the HTA physician reviewer today about her case.   For questions or updates, please contact [place] Please consult [email or web address] for contact info under       Signed, Lauree Chandler, MD  07/15/2020, 8:37 AM

## 2020-07-18 DIAGNOSIS — I4891 Unspecified atrial fibrillation: Secondary | ICD-10-CM | POA: Diagnosis not present

## 2020-07-18 DIAGNOSIS — I509 Heart failure, unspecified: Secondary | ICD-10-CM | POA: Diagnosis not present

## 2020-07-18 DIAGNOSIS — I319 Disease of pericardium, unspecified: Secondary | ICD-10-CM | POA: Diagnosis not present

## 2020-07-18 DIAGNOSIS — R5381 Other malaise: Secondary | ICD-10-CM | POA: Diagnosis not present

## 2020-07-25 ENCOUNTER — Ambulatory Visit: Payer: PPO

## 2020-07-26 LAB — CUP PACEART REMOTE DEVICE CHECK
Date Time Interrogation Session: 20220424021156
Implantable Pulse Generator Implant Date: 20200731

## 2020-07-28 DIAGNOSIS — I340 Nonrheumatic mitral (valve) insufficiency: Secondary | ICD-10-CM | POA: Diagnosis not present

## 2020-07-28 DIAGNOSIS — I319 Disease of pericardium, unspecified: Secondary | ICD-10-CM | POA: Diagnosis not present

## 2020-07-28 DIAGNOSIS — R5381 Other malaise: Secondary | ICD-10-CM | POA: Diagnosis not present

## 2020-07-28 DIAGNOSIS — I509 Heart failure, unspecified: Secondary | ICD-10-CM | POA: Diagnosis not present

## 2020-07-29 ENCOUNTER — Inpatient Hospital Stay (HOSPITAL_COMMUNITY)
Admission: EM | Admit: 2020-07-29 | Discharge: 2020-07-31 | DRG: 951 | Disposition: E | Payer: PPO | Source: Skilled Nursing Facility | Attending: Internal Medicine | Admitting: Internal Medicine

## 2020-07-29 ENCOUNTER — Emergency Department (HOSPITAL_COMMUNITY): Payer: PPO

## 2020-07-29 DIAGNOSIS — I5033 Acute on chronic diastolic (congestive) heart failure: Secondary | ICD-10-CM | POA: Diagnosis not present

## 2020-07-29 DIAGNOSIS — M1990 Unspecified osteoarthritis, unspecified site: Secondary | ICD-10-CM | POA: Diagnosis not present

## 2020-07-29 DIAGNOSIS — G629 Polyneuropathy, unspecified: Secondary | ICD-10-CM | POA: Diagnosis not present

## 2020-07-29 DIAGNOSIS — E785 Hyperlipidemia, unspecified: Secondary | ICD-10-CM | POA: Diagnosis present

## 2020-07-29 DIAGNOSIS — Z85828 Personal history of other malignant neoplasm of skin: Secondary | ICD-10-CM | POA: Diagnosis not present

## 2020-07-29 DIAGNOSIS — Z8249 Family history of ischemic heart disease and other diseases of the circulatory system: Secondary | ICD-10-CM

## 2020-07-29 DIAGNOSIS — N1832 Chronic kidney disease, stage 3b: Secondary | ICD-10-CM | POA: Diagnosis not present

## 2020-07-29 DIAGNOSIS — K219 Gastro-esophageal reflux disease without esophagitis: Secondary | ICD-10-CM | POA: Diagnosis not present

## 2020-07-29 DIAGNOSIS — Z96652 Presence of left artificial knee joint: Secondary | ICD-10-CM | POA: Diagnosis present

## 2020-07-29 DIAGNOSIS — Z20822 Contact with and (suspected) exposure to covid-19: Secondary | ICD-10-CM | POA: Diagnosis present

## 2020-07-29 DIAGNOSIS — I959 Hypotension, unspecified: Secondary | ICD-10-CM | POA: Diagnosis not present

## 2020-07-29 DIAGNOSIS — Z888 Allergy status to other drugs, medicaments and biological substances status: Secondary | ICD-10-CM | POA: Diagnosis not present

## 2020-07-29 DIAGNOSIS — Z833 Family history of diabetes mellitus: Secondary | ICD-10-CM | POA: Diagnosis not present

## 2020-07-29 DIAGNOSIS — I480 Paroxysmal atrial fibrillation: Secondary | ICD-10-CM | POA: Diagnosis present

## 2020-07-29 DIAGNOSIS — T50905A Adverse effect of unspecified drugs, medicaments and biological substances, initial encounter: Secondary | ICD-10-CM

## 2020-07-29 DIAGNOSIS — I517 Cardiomegaly: Secondary | ICD-10-CM | POA: Diagnosis not present

## 2020-07-29 DIAGNOSIS — Z8673 Personal history of transient ischemic attack (TIA), and cerebral infarction without residual deficits: Secondary | ICD-10-CM

## 2020-07-29 DIAGNOSIS — Z79899 Other long term (current) drug therapy: Secondary | ICD-10-CM

## 2020-07-29 DIAGNOSIS — Z515 Encounter for palliative care: Principal | ICD-10-CM

## 2020-07-29 DIAGNOSIS — N189 Chronic kidney disease, unspecified: Secondary | ICD-10-CM | POA: Diagnosis present

## 2020-07-29 DIAGNOSIS — I340 Nonrheumatic mitral (valve) insufficiency: Secondary | ICD-10-CM | POA: Diagnosis present

## 2020-07-29 DIAGNOSIS — I4430 Unspecified atrioventricular block: Secondary | ICD-10-CM | POA: Diagnosis not present

## 2020-07-29 DIAGNOSIS — Z823 Family history of stroke: Secondary | ICD-10-CM

## 2020-07-29 DIAGNOSIS — I130 Hypertensive heart and chronic kidney disease with heart failure and stage 1 through stage 4 chronic kidney disease, or unspecified chronic kidney disease: Secondary | ICD-10-CM | POA: Diagnosis not present

## 2020-07-29 DIAGNOSIS — J9601 Acute respiratory failure with hypoxia: Secondary | ICD-10-CM | POA: Diagnosis not present

## 2020-07-29 DIAGNOSIS — I499 Cardiac arrhythmia, unspecified: Secondary | ICD-10-CM | POA: Diagnosis not present

## 2020-07-29 DIAGNOSIS — Z66 Do not resuscitate: Secondary | ICD-10-CM | POA: Diagnosis not present

## 2020-07-29 DIAGNOSIS — Z882 Allergy status to sulfonamides status: Secondary | ICD-10-CM

## 2020-07-29 DIAGNOSIS — Z7901 Long term (current) use of anticoagulants: Secondary | ICD-10-CM

## 2020-07-29 DIAGNOSIS — F0390 Unspecified dementia without behavioral disturbance: Secondary | ICD-10-CM | POA: Diagnosis present

## 2020-07-29 DIAGNOSIS — I5032 Chronic diastolic (congestive) heart failure: Secondary | ICD-10-CM | POA: Diagnosis not present

## 2020-07-29 DIAGNOSIS — E162 Hypoglycemia, unspecified: Secondary | ICD-10-CM | POA: Diagnosis present

## 2020-07-29 DIAGNOSIS — R001 Bradycardia, unspecified: Secondary | ICD-10-CM

## 2020-07-29 DIAGNOSIS — N179 Acute kidney failure, unspecified: Secondary | ICD-10-CM | POA: Diagnosis present

## 2020-07-29 DIAGNOSIS — R0789 Other chest pain: Secondary | ICD-10-CM | POA: Diagnosis not present

## 2020-07-29 DIAGNOSIS — R680 Hypothermia, not associated with low environmental temperature: Secondary | ICD-10-CM | POA: Diagnosis present

## 2020-07-29 LAB — COMPREHENSIVE METABOLIC PANEL
ALT: 246 U/L — ABNORMAL HIGH (ref 0–44)
AST: 324 U/L — ABNORMAL HIGH (ref 15–41)
Albumin: 2.7 g/dL — ABNORMAL LOW (ref 3.5–5.0)
Alkaline Phosphatase: 63 U/L (ref 38–126)
Anion gap: 16 — ABNORMAL HIGH (ref 5–15)
BUN: 42 mg/dL — ABNORMAL HIGH (ref 8–23)
CO2: 13 mmol/L — ABNORMAL LOW (ref 22–32)
Calcium: 6.9 mg/dL — ABNORMAL LOW (ref 8.9–10.3)
Chloride: 106 mmol/L (ref 98–111)
Creatinine, Ser: 3.16 mg/dL — ABNORMAL HIGH (ref 0.44–1.00)
GFR, Estimated: 14 mL/min — ABNORMAL LOW (ref >60)
Glucose, Bld: 70 mg/dL (ref 70–99)
Potassium: 4.6 mmol/L (ref 3.5–5.1)
Sodium: 135 mmol/L (ref 135–145)
Total Bilirubin: 2 mg/dL — ABNORMAL HIGH (ref 0.3–1.2)
Total Protein: 4.6 g/dL — ABNORMAL LOW (ref 6.5–8.1)

## 2020-07-29 LAB — CBC
HCT: 43 % (ref 36.0–46.0)
Hemoglobin: 13.3 g/dL (ref 12.0–15.0)
MCH: 28.6 pg (ref 26.0–34.0)
MCHC: 30.9 g/dL (ref 30.0–36.0)
MCV: 92.5 fL (ref 80.0–100.0)
Platelets: 198 10*3/uL (ref 150–400)
RBC: 4.65 MIL/uL (ref 3.87–5.11)
RDW: 16.7 % — ABNORMAL HIGH (ref 11.5–15.5)
WBC: 13.9 10*3/uL — ABNORMAL HIGH (ref 4.0–10.5)
nRBC: 0.5 % — ABNORMAL HIGH (ref 0.0–0.2)

## 2020-07-29 LAB — I-STAT CHEM 8, ED
BUN: 44 mg/dL — ABNORMAL HIGH (ref 8–23)
Calcium, Ion: 0.76 mmol/L — CL (ref 1.15–1.40)
Chloride: 107 mmol/L (ref 98–111)
Creatinine, Ser: 3.3 mg/dL — ABNORMAL HIGH (ref 0.44–1.00)
Glucose, Bld: 64 mg/dL — ABNORMAL LOW (ref 70–99)
HCT: 40 % (ref 36.0–46.0)
Hemoglobin: 13.6 g/dL (ref 12.0–15.0)
Potassium: 4.6 mmol/L (ref 3.5–5.1)
Sodium: 135 mmol/L (ref 135–145)
TCO2: 17 mmol/L — ABNORMAL LOW (ref 22–32)

## 2020-07-29 LAB — CBG MONITORING, ED
Glucose-Capillary: 27 mg/dL — CL (ref 70–99)
Glucose-Capillary: 41 mg/dL — CL (ref 70–99)
Glucose-Capillary: 57 mg/dL — ABNORMAL LOW (ref 70–99)
Glucose-Capillary: 187 mg/dL — ABNORMAL HIGH (ref 70–99)
Glucose-Capillary: 206 mg/dL — ABNORMAL HIGH (ref 70–99)

## 2020-07-29 LAB — SARS CORONAVIRUS 2 (TAT 6-24 HRS): SARS Coronavirus 2: NEGATIVE

## 2020-07-29 LAB — TROPONIN I (HIGH SENSITIVITY): Troponin I (High Sensitivity): 59 ng/L — ABNORMAL HIGH (ref <18)

## 2020-07-29 MED ORDER — GLYCOPYRROLATE 0.2 MG/ML IJ SOLN: 0.2000 mg | INTRAMUSCULAR | Status: DC | PRN

## 2020-07-29 MED ORDER — ACETAMINOPHEN 325 MG PO TABS: 650.0000 mg | ORAL_TABLET | Freq: Four times a day (QID) | ORAL | Status: DC | PRN

## 2020-07-29 MED ORDER — LORAZEPAM 1 MG PO TABS: 1.0000 mg | ORAL_TABLET | ORAL | Status: DC | PRN

## 2020-07-29 MED ORDER — MORPHINE SULFATE (PF) 2 MG/ML IV SOLN
2.0000 mg | INTRAVENOUS | Status: DC | PRN
Start: 2020-07-29 — End: 2020-07-29

## 2020-07-29 MED ORDER — SODIUM CHLORIDE 0.9 % IV SOLN: Freq: Once | INTRAVENOUS | Status: AC

## 2020-07-29 MED ORDER — CALCIUM GLUCONATE 10 % IV SOLN
1.0000 g | Freq: Once | INTRAVENOUS | Status: AC
  Administered 2020-07-29: 1 g via INTRAVENOUS
  Filled 2020-07-29: qty 10

## 2020-07-29 MED ORDER — CALCIUM GLUCONATE 10 % IV SOLN: 1.0000 g | Freq: Once | INTRAVENOUS | Status: DC

## 2020-07-29 MED ORDER — ATROPINE SULFATE 1 MG/10ML IJ SOSY
PREFILLED_SYRINGE | INTRAMUSCULAR | Status: AC
  Administered 2020-07-29: 0.4 mg via INTRAVENOUS
  Filled 2020-07-29: qty 10

## 2020-07-29 MED ORDER — DEXTROSE 50 % IV SOLN
25.0000 g | Freq: Once | INTRAVENOUS | Status: AC
  Administered 2020-07-29: 25 g via INTRAVENOUS
  Filled 2020-07-29: qty 50

## 2020-07-29 MED ORDER — MORPHINE SULFATE (PF) 2 MG/ML IV SOLN
1.0000 mg | INTRAVENOUS | Status: DC | PRN
Start: 2020-07-29 — End: 2020-07-30

## 2020-07-29 MED ORDER — DEXTROSE 50 % IV SOLN
50.0000 g | Freq: Once | INTRAVENOUS | Status: AC
  Administered 2020-07-29: 50 g via INTRAVENOUS
  Filled 2020-07-29: qty 50

## 2020-07-29 MED ORDER — MORPHINE SULFATE (PF) 2 MG/ML IV SOLN
2.0000 mg | Freq: Once | INTRAVENOUS | Status: AC
  Administered 2020-07-29: 2 mg via INTRAVENOUS
  Filled 2020-07-29: qty 1

## 2020-07-29 MED ORDER — BIOTENE DRY MOUTH MT LIQD: 15.0000 mL | OROMUCOSAL | Status: DC | PRN

## 2020-07-29 MED ORDER — GLYCOPYRROLATE 1 MG PO TABS
1.0000 mg | ORAL_TABLET | ORAL | Status: DC | PRN
  Filled 2020-07-29: qty 1

## 2020-07-29 MED ORDER — DEXTROSE 10 % IV SOLN
5.0000 mL/kg | INTRAVENOUS | Status: DC
  Administered 2020-07-29: 316 mL via INTRAVENOUS

## 2020-07-29 MED ORDER — SODIUM CHLORIDE 0.9 % IV BOLUS: 1000.0000 mL | Freq: Once | INTRAVENOUS | Status: DC

## 2020-07-29 MED ORDER — ONDANSETRON 4 MG PO TBDP: 4.0000 mg | ORAL_TABLET | Freq: Four times a day (QID) | ORAL | Status: DC | PRN

## 2020-07-29 MED ORDER — ACETAMINOPHEN 650 MG RE SUPP: 650.0000 mg | Freq: Four times a day (QID) | RECTAL | Status: DC | PRN

## 2020-07-29 MED ORDER — HALOPERIDOL LACTATE 2 MG/ML PO CONC
0.5000 mg | ORAL | Status: DC | PRN
  Filled 2020-07-29: qty 0.3

## 2020-07-29 MED ORDER — LORAZEPAM 2 MG/ML IJ SOLN: 1.0000 mg | INTRAMUSCULAR | Status: DC | PRN

## 2020-07-29 MED ORDER — DEXTROSE 50 % IV SOLN
INTRAVENOUS | Status: AC
  Filled 2020-07-29: qty 50

## 2020-07-29 MED ORDER — HALOPERIDOL 0.5 MG PO TABS
0.5000 mg | ORAL_TABLET | ORAL | Status: DC | PRN
  Filled 2020-07-29: qty 1

## 2020-07-29 MED ORDER — LORAZEPAM 2 MG/ML PO CONC: 1.0000 mg | ORAL | Status: DC | PRN

## 2020-07-29 MED ORDER — IPRATROPIUM-ALBUTEROL 0.5-2.5 (3) MG/3ML IN SOLN: 3.0000 mL | Freq: Four times a day (QID) | RESPIRATORY_TRACT | Status: DC | PRN

## 2020-07-29 MED ORDER — MORPHINE 100MG IN NS 100ML (1MG/ML) PREMIX INFUSION
1.0000 mg/h | INTRAVENOUS | Status: DC
  Administered 2020-07-29: 3 mg/h via INTRAVENOUS
  Filled 2020-07-29: qty 100

## 2020-07-29 MED ORDER — POLYVINYL ALCOHOL 1.4 % OP SOLN
1.0000 [drp] | Freq: Four times a day (QID) | OPHTHALMIC | Status: DC | PRN
  Filled 2020-07-29: qty 15

## 2020-07-29 MED ORDER — ONDANSETRON HCL 4 MG/2ML IJ SOLN: 4.0000 mg | Freq: Four times a day (QID) | INTRAMUSCULAR | Status: DC | PRN

## 2020-07-29 MED ORDER — HALOPERIDOL LACTATE 5 MG/ML IJ SOLN: 0.5000 mg | INTRAMUSCULAR | Status: DC | PRN

## 2020-07-29 MED ORDER — ATROPINE SULFATE 1 MG/10ML IJ SOSY: 0.4000 mg | PREFILLED_SYRINGE | Freq: Once | INTRAMUSCULAR | Status: AC

## 2020-07-29 MED ORDER — DIPHENHYDRAMINE HCL 50 MG/ML IJ SOLN: 12.5000 mg | INTRAMUSCULAR | Status: DC | PRN

## 2020-07-30 NOTE — Progress Notes (Signed)
   08/20/20 [birth year]  Attending [place]  Attending Physician Notified Y  Attending Physician (First and Last Name) Reubin Milan  Will the above attending physician sign death certificate? No  Physician (First and Last Name) Who Will Sign Death Certificate Fuller Plan, MD  Post Mortem Checklist  Date of Death 08-20-20  Time of Death 07-25-28  Pronounced By Tawanna Sat, RN and Jewel, RN  Next of kin notified Yes  Name of next of kin notified of death Reannon Candella  Contact Person's Relationship to Patient Son  Contact Person's Phone Number [phone]  Contact Person's address [address], [place], [place], [address]  Was the patient a No Code Blue or a Limited Code Blue? No  Did the patient die unattended? No  Patient restrained? Not applicable  Height 5\' 2"  (1.575 m)  Weight 63.1 kg  [employer] (previously known as [employer])  Notification Date 2020/08/20  Notification Time 2118/07/26  [employer] Number [phone]  Is patient a potential donor? N  Autopsy  Autopsy requested by N/A  Patient and Hospital Property Returned  Patient belongings from bedside/safe/pharmacy returned  Yes  Valuables returned to? [facility]  Specify valuables returned top  Dermatherapy linen/gowns NOT sent with patient or transporter Not applicable  Dead on Arrival (Emergency Department)  Patient dead on arrival? No  Notifications  Patient Placement notified that Post Mortem checklist is complete Yes  Medical Examiner  Is this a medical examiner's case? [place] home name/address/phone # Hand. [place] [phone]  Planned location of pickup [facility]

## 2020-07-31 NOTE — Progress Notes (Signed)
   08-25-20 1423  Clinical Encounter Type  Visited With Patient and family together  Visit Type Trauma  Referral From Nurse  Consult/Referral To Chaplain  Chaplain received page and spoke with nurse stated chaplain needed STAT. The patient's son, [name], is at her bedside. He stated his sister should arrive this evening. He stated his mother enjoyed going to church and knew she would appreciate prayer. This chaplain provided comfort, read Psalms 23, Lord Prayer, and prayer of comfort. He stated he is appreciative of the visit. Advised him chaplain remains available if needed. This note was prepared by Jeanine Luz, M.Div..  For questions please contact by phone [phone].

## 2020-07-31 NOTE — ED Notes (Signed)
Attempted report 

## 2020-07-31 NOTE — H&P (Addendum)
History and Physical    Morgan Boyer ZOX:[id number] DOB: 1931/07/11 DOA: 08/17/20  Referring MD/NP/PA: Lajean Saver, MD PCP: Aretta Nip, MD  Patient coming from: Nursing facility via EMS  Chief Complaint: Respiratory distress  I have personally briefly reviewed patient's old medical records in [place]   HPI: Morgan Boyer is a 87 y.o. female with medical history significant of hypertension, hyperlipidemia, CVA in 10/2018, atrial fibrillation on Eliquis, severe mitral regurgitation, CKD stage IIIb, dementia, peripheral neuropathy, and GERD presented in respiratory distress.  Patient had just recently been hospitalized 4/5-4/15 complaints of chest pain.  Patient had been placed on heparin drip and underwent left heart cath which showed 40% stenosis of the ostial left circumflex, but no significant coronary artery disease.  TEE revealed severe mitral regurgitation secondary to flail P1/P2 and was thought that she may not be a good candidate given her overall poor functionality, but there was question of the possibility of a MitraClip at that time.  She was discharged to skilled nursing facility and had seen to be temporarily getting stronger.  Over the last several days patient had decreased p.o. intake and was found by staff to have heart rates in the 81X with systolic blood pressures around 90.  EMS was called and patient was given 700 mL of fluid in route.  On to be disoriented to place and time.  ED Course: Upon admission into the emergency department patient was seen to have a temperature of 95.2 F, heart rates initially in the 30s elevated up to 76, respiration 14-23, blood pressure 34/22-45/17, and O2 saturations 45-78% on nonrebreather.  Labs significant for WBC 13.9, CO2 13, BUN 44, creatinine 3.3, calcium 6.9, and glucose 64.  Chest x-ray showed stable cardiomegaly with mitral annulus calcification again noted without significant edema or airspace opacity.  Patient was  noted to be DNR.  Patient had received IV fluids, insulin dextrose for the hypoglycemia, atropine 0.4 mg IV for bradycardia, and had been ordered calcium gluconate.  Patient's son arrived was able to relay his mother wishes to not want aggressive interventions and being made comfortable.  TRH called to admit.  Review of Systems  Unable to perform ROS: Mental status change    Past Medical History:  Diagnosis Date  . Abnormality of gait   . Adenomatous colon polyp   . Arthritis   . Atrial fibrillation ([place])   . Back pain   . Cancer ([place])    skin cancers  . Depression   . Diverticulosis   . DJD (degenerative joint disease) of knee   . Esophageal reflux   . H/O hiatal hernia   . Heart murmur   . Herpes zoster    throat  . History of shingles    left throat 07/2007  . Hyperlipidemia   . Hypertension   . Memory changes 01/14/2013  . Neuromuscular disorder ([place])    peripheral neuropathy  . Nocturnal leg cramps 07/15/2014  . Peripheral neuropathy   . Polyneuropathy in other diseases classified elsewhere ([place]) 01/14/2013  . Severe mitral regurgitation   . Stroke (cerebrum) [facility])     Past Surgical History:  Procedure Laterality Date  . ABDOMINAL HYSTERECTOMY    . APPENDECTOMY    . BACK SURGERY  07/2017  . BUBBLE STUDY  10/31/2018   Procedure: BUBBLE STUDY;  Surgeon: Fay Records, MD;  Location: [place];  Service: Cardiovascular;;  . BUBBLE STUDY  07/08/2020   Procedure: BUBBLE STUDY;  Surgeon: Harrell Gave,  Bridgette, MD;  Location: [facility];  Service: Cardiovascular;;  . COLON SURGERY     2001 diverticulitis with infection ..drained surgically .  Marland Kitchen EYE SURGERY     cat ext bil   . JOINT REPLACEMENT     right  . KNEE ARTHROSCOPY  11/08/2011  . LOOP RECORDER INSERTION N/A 10/31/2018   Procedure: LOOP RECORDER INSERTION;  Surgeon: Constance Haw, MD;  Location: [facility] CV LAB;  Service: Cardiovascular;  Laterality: N/A;  . RIGHT/LEFT HEART CATH AND CORONARY  ANGIOGRAPHY N/A 07/07/2020   Procedure: RIGHT/LEFT HEART CATH AND CORONARY ANGIOGRAPHY;  Surgeon: Troy Sine, MD;  Location: [facility] CV LAB;  Service: Cardiovascular;  Laterality: N/A;  . TEE WITHOUT CARDIOVERSION N/A 10/31/2018   Procedure: TRANSESOPHAGEAL ECHOCARDIOGRAM (TEE);  Surgeon: Fay Records, MD;  Location: [place];  Service: Cardiovascular;  Laterality: N/A;  . TEE WITHOUT CARDIOVERSION N/A 07/08/2020   Procedure: TRANSESOPHAGEAL ECHOCARDIOGRAM (TEE);  Surgeon: Buford Dresser, MD;  Location: [facility] ENDOSCOPY;  Service: Cardiovascular;  Laterality: N/A;  . TONSILLECTOMY    . TOTAL KNEE ARTHROPLASTY  11/07/2011   Procedure: TOTAL KNEE ARTHROPLASTY;  Surgeon: Ninetta Lights, MD;  Location: [place];  Service: Orthopedics;  Laterality: Left;  . TUBAL LIGATION    . WRIST SURGERY     for skin cancer     reports that she has never smoked. She has never used smokeless tobacco. She reports that she does not drink alcohol and does not use drugs.  Allergies  Allergen Reactions  . Septra [Sulfamethoxazole-Trimethoprim] Hives  . Lotensin [Benazepril Hcl] Other (See Comments)    NOT KNOWN    Family History  Problem Relation Age of Onset  . Congestive Heart Failure Mother   . Stroke Father 63  . Heart attack Brother 69  . Diabetes Brother     Prior to Admission medications   Medication Sig Start Date End Date Taking? Authorizing Provider  acetaminophen (TYLENOL) 325 MG tablet Take 1-2 tablets (325-650 mg total) by mouth every 4 (four) hours as needed for mild pain. 11/11/18  Yes Love, Ivan Anchors, PA-C  amiodarone (PACERONE) 200 MG tablet Take 1 tablet (200 mg total) by mouth daily. 07/16/20  Yes Bhagat, Bhavinkumar, PA  apixaban (ELIQUIS) 2.5 MG TABS tablet Take 1 tablet (2.5 mg total) by mouth 2 (two) times daily. 07/15/20  Yes Bhagat, Bhavinkumar, PA  b complex vitamins tablet Take 1 tablet by mouth daily.   Yes [provider]  baclofen 5 MG TABS Take 5 mg by mouth  at bedtime. 06/01/20  Yes Ward Givens, NP  colchicine 0.6 MG tablet Take 1 tablet (0.6 mg total) by mouth daily. 07/16/20  Yes Bhagat, Bhavinkumar, PA  esomeprazole (NEXIUM) 20 MG capsule Take 20 mg by mouth daily.   Yes [provider]  furosemide (LASIX) 20 MG tablet Take 1 tablet (20 mg total) by mouth daily. 07/15/20  Yes Bhagat, Bhavinkumar, PA  gabapentin (NEURONTIN) 300 MG capsule Take 1 capsule (300 mg total) by mouth at bedtime. 06/01/20  Yes Ward Givens, NP  hydrocortisone 2.5 % cream Apply 1 application topically daily as needed (rash).  08/22/15  Yes [provider]  lisinopril (ZESTRIL) 20 MG tablet Take 1 tablet (20 mg total) by mouth daily. 07/15/20  Yes Bhagat, Bhavinkumar, PA  metoprolol tartrate (LOPRESSOR) 25 MG tablet Take 1 tablet (25 mg total) by mouth 2 (two) times daily. 07/15/20  Yes Bhagat, Crista Luria, PA  Polyethyl Glycol-Propyl Glycol (SYSTANE OP) Place 1 drop  into both eyes 3 (three) times daily as needed (for dry eyes).    Yes [provider]  simvastatin (ZOCOR) 20 MG tablet Take 20 mg by mouth every evening.   Yes [provider]    Physical Exam:  Constitutional: Frail elderly female who appears ill Vitals:   08-05-2020 1445 2020/08/05 1500 08/05/20 1545 08/05/20 1630  BP: (!) 39/23  (!) 42/26 (!) 34/22  Pulse: 72  76 64  Resp: (!) 21  (!) 23 14  Temp:      TempSrc:      SpO2: (!) 66%  (!) 45% (!) 66%  Weight:  63.1 kg     Eyes: PERRL, lids and conjunctivae normal ENMT: Mucous membranes are dry. Posterior pharynx clear of any exudate or lesions.  Neck: normal, supple, no masses, no thyromegaly Respiratory: Tachypneic on nonrebreather mask with O2 saturations in the 60s. Cardiovascular: Bradycardic positive/6 systolic murmur.  Abdomen: no tenderness, no masses palpated. No hepatosplenomegaly. Bowel sounds positive.  Musculoskeletal: No joint deformity upper and lower extremities. Good ROM, no contractures. Normal muscle  tone.  Skin:.  Ashen gray appearance to face. Neurologic: Patient able to move all extremities Psychiatric: Lethargic, and not responding or following commands at this time    Labs on Admission: I have personally reviewed following labs and imaging studies  CBC: Recent Labs  Lab 2020/08/05 1411 08/05/2020 1448  WBC 13.9*  --   HGB 13.3 13.6  HCT 43.0 40.0  MCV 92.5  --   PLT 198  --    Basic Metabolic Panel: Recent Labs  Lab 2020-08-05 1411 Aug 05, 2020 1448  NA 135 135  K 4.6 4.6  CL 106 107  CO2 13*  --   GLUCOSE 70 64*  BUN 42* 44*  CREATININE 3.16* 3.30*  CALCIUM 6.9*  --    GFR: Estimated Creatinine Clearance: 10.7 mL/min (A) (by C-G formula based on SCr of 3.3 mg/dL (H)). Liver Function Tests: Recent Labs  Lab 08/05/2020 1411  AST 324*  ALT 246*  ALKPHOS 63  BILITOT 2.0*  PROT 4.6*  ALBUMIN 2.7*   No results for input(s): LIPASE, AMYLASE in the last 168 hours. No results for input(s): AMMONIA in the last 168 hours. Coagulation Profile: No results for input(s): INR, PROTIME in the last 168 hours. Cardiac Enzymes: No results for input(s): CKTOTAL, CKMB, CKMBINDEX, TROPONINI in the last 168 hours. BNP (last 3 results) No results for input(s): PROBNP in the last 8760 hours. HbA1C: No results for input(s): HGBA1C in the last 72 hours. CBG: Recent Labs  Lab Aug 05, 2020 1501 08/05/20 1503 Aug 05, 2020 1519 08-05-2020 1539 05-Aug-2020 1628  GLUCAP 27* 41* 57* 206* 187*   Lipid Profile: No results for input(s): CHOL, HDL, LDLCALC, TRIG, CHOLHDL, LDLDIRECT in the last 72 hours. Thyroid Function Tests: No results for input(s): TSH, T4TOTAL, FREET4, T3FREE, THYROIDAB in the last 72 hours. Anemia Panel: No results for input(s): VITAMINB12, FOLATE, FERRITIN, TIBC, IRON, RETICCTPCT in the last 72 hours. Urine analysis:    Component Value Date/Time   COLORURINE YELLOW 10/28/2018 1644   APPEARANCEUR HAZY (A) 10/28/2018 1644   LABSPEC 1.014 10/28/2018 1644   PHURINE 6.0  10/28/2018 1644   GLUCOSEU NEGATIVE 10/28/2018 1644   HGBUR NEGATIVE 10/28/2018 1644   BILIRUBINUR NEGATIVE 10/28/2018 1644   KETONESUR 20 (A) 10/28/2018 1644   PROTEINUR NEGATIVE 10/28/2018 1644   UROBILINOGEN 0.2 10/31/2011 1139   NITRITE NEGATIVE 10/28/2018 1644   LEUKOCYTESUR LARGE (A) 10/28/2018 1644   Sepsis Labs: No results  found for this or any previous visit (from the past 240 hour(s)).   Radiological Exams on Admission: DG Chest Port 1 View  Result Date: 2020-08-02 CLINICAL DATA:  Bradycardia EXAM: PORTABLE CHEST 1 VIEW COMPARISON:  July 05, 2020 FINDINGS: There is no edema or airspace opacity. There is cardiomegaly with pulmonary vascularity normal. There is aortic atherosclerosis. There is mitral annulus calcification. Loop recorder noted on the left. No adenopathy. No bone lesions. IMPRESSION: Stable cardiomegaly. Mitral annulus calcification again noted. No edema or airspace opacity. Loop recorder on left. Aortic Atherosclerosis (ICD10-I70.0). Electronically Signed   By: Lowella Grip III M.D.   On: 02-Aug-2020 15:12    EKG: Independently reviewed.  Junctional bradycardia 31 bpm  Assessment/Plan Bradycardia Hypotension Severe mitral regurgitation Flail mitral valve leaflet Hypocalcemia Hypoglycemia Leukocytosis Diastolic congestive heart failure Paroxysmal atrial fibrillation Acute kidney injury superimposed on chronic kidney disease IIIb DNR Patient presented from nursing facility due to decreased p.o. after being found to be bradycardic with heart rates into the 30s blood pressures 34/22.  Work-up revealed worsening AKI creatinine up to 3.3 (baseline Cr previously 1.17).  Patient's son reports patient is to remain DNR and did not want to pursue any aggressive measures at this time and to make the patient comfortable.  Suspect progression of patient's mitral valve regurgitation with flail leaflet patient's symptoms. - Admit to a MedSurg bed - Palliative care order  set initiated - Discontinue cardiac monitoring - Routine vital sign checks - Discontinued home medications - N.p.o. for ice chips - Okay for RN to pronounce death - Aspiration precautions - Maintain IV access - Morphine drip - Zofran IV prn nausea/vomiting - Ativan IV prn anxiety - Haloperidol IV prn agitation or delirium - Glycopyrrolate prn excessive secretions - Albuterol prn wheezing   - Palliative care consulted, we will follow-up for any further recommendation  DVT prophylaxis: None Code Status: DNR/DNI Family Communication: None of care with the patient's son present at bedside discussed plan of care with the patient's son present at bedside Disposition Plan: Hospitalization to likely end in patient's death Consults called: Palliative care Admission status: Inpatient possibly require more than 2 midnight stay  Norval Morton MD [facility]   If 7PM-7AM, please contact night-coverage   08-02-2020, 5:02 PM

## 2020-07-31 NOTE — ED Triage Notes (Signed)
Pt bib [facility] from a living facility. Per [facility], pt has decreased po intake for the past few days. HR in the 30s.  Systolic in the 70J after 751ml fluids were given en route. Pt is disoriented to time &place.

## 2020-07-31 NOTE — Progress Notes (Signed)
Pt roomed on unit at 1840hrs. 15l via non rebreather mask in use. Morphine drip initiated at 3mg /hr . Family at bedside, no concerns voiced

## 2020-07-31 NOTE — Consult Note (Signed)
Consultation Note Date: 08-12-2020   Patient Name: Morgan Boyer  DOB: Jun 13, 1931  MRN: [id number]  Age / Sex: 87 y.o., female  PCP: Rankins, Bill Salinas, MD Referring Physician: Norval Morton, MD  Reason for Consultation: end of life care  HPI/Patient Profile: 87 y.o. female  with past medical history of CVA in 2020, atrial fibrillation on Eliquis, severe mitral regurgitation, CKD stage IIIb, dementia, peripheral neuropathy, HTN, HLD. She was recently hospitalized 4/5--4/15 with complaint of chest pain. Cardiac cath 4/7 showed 40% stenosis of the ostial left circumflex , but no significant coronary disease. TEE revealed severe mitral regurgitation secondary to flail P1/P2 and patient was to follow-up outpatient for possible MitraClip evaluation, although it was felt she may not be a good candidate given poor overall functional status and frailty. Patient was also started on colchicine for possible pericarditis and had improvement in chest pain. She was discharged to SNF for rehab on 4/15, and initially seemed to be improving. On 08/12/2020 she was found by staff to have HR in the 25'W and systolic blood pressure around 90. EMS was called and she was brought to the emergency department. ED Course: Temp 95.2, HR in the 30's, BP 34/22-45/17, and O2 saturations 45-78% on nonrebreather. Son arrived and conveyed that his mother does not want aggressive interventions. Comfort care has been started by the admitting physician.  Clinical Assessment and Goals of Care: I have reviewed medical records including EPIC notes, labs and imaging, examined the patient and met at bedside with family in the ER to discuss diagnosis, prognosis, GOC, EOL wishes, disposition, and options. There are 2 sons and 1 daughter at bedside.   Patient appears comfortable. Unresponsive to voice and light touch. No non-verbal signs of pain or discomfort  noted. Respirations are even and unlabored. No excessive respiratory secretions noted.    I introduced Palliative Medicine as specialized medical care for people living with serious illness. It focuses on providing relief from the symptoms and stress of a serious illness.   As far as functional and status, she had been living at home independently up until 4/5 when she was admitted for chest pain.  We discussed her current condition and what it means in the larger context of her ongoing co-morbidities.  Natural trajectory at EOL was discussed.  The difference between full scope medical intervention and comfort care was considered. Family shares that patient was very clear in her wishes not to have life-prolonging measures. Reviewed the concept of a comfort path, emphasizing this means stopping or withholding full scope medical interventions with the goal of comfort rather than prolonging life. Family agrees that comfort is the main goal for their mother.   Discussed comfort care provided in-house, and what that would look like--keeping her clean and dry, no labs, no artificial hydration or feeding, no antibiotics, minimizing of medications, comfort feeds, medication for pain and dyspnea as needed.   Questions and concerns were addressed.  The family was encouraged to call with questions or concerns.  Primary decision maker: Patient's children    SUMMARY OF RECOMMENDATIONS    Full comfort measures   DNR/DNI as previously documented  Unrestricted visitation orders were placed per current [facility] EOL visitation policy   Provide frequent assessments and administer PRN medications as clinically necessary to ensure EOL comfort  PMT will continue to follow holistically  Code Status/Advance Care Planning:  DNR  Symptom Management:   Start morphine infusion 2-10 mg/hr  Lorazepam (ATIVAN) prn for anxiety  Haloperidol (HALDOL) prn for agitation   Glycopyrrolate (ROBINUL)  for excessive secretions  Ondansetron (ZOFRAN) prn for nausea  Polyvinyl alcohol (LIQUIFILM TEARS) prn for dry eyes  Antiseptic oral rinse (BIOTENE) prn for dry mouth   Palliative Prophylaxis:   Frequent Pain Assessment, Oral Care and Turn Reposition  Additional Recommendations (Limitations, Scope, Preferences):  Full Comfort Care  Psycho-social/Spiritual:   Created space and opportunity for family to express thoughts and feelings regarding patient's current medical situation.   Emotional support provided   Prognosis:   Hours - Days  Discharge Planning: Anticipated Hospital Death      Primary Diagnoses: Present on Admission: . Bradycardia . Paroxysmal atrial fibrillation (HCC) . Severe mitral regurgitation . Hypotension . Hypoglycemia without diagnosis of diabetes mellitus . Acute kidney injury superimposed on CKD ([place]) . Chronic diastolic CHF (congestive heart failure) ([place]) . Hypocalcemia   I have reviewed the medical record, interviewed the patient and family, and examined the patient. The following aspects are pertinent.  Past Medical History:  Diagnosis Date  . Abnormality of gait   . Adenomatous colon polyp   . Arthritis   . Atrial fibrillation ([place])   . Back pain   . Cancer ([place])    skin cancers  . Depression   . Diverticulosis   . DJD (degenerative joint disease) of knee   . Esophageal reflux   . H/O hiatal hernia   . Heart murmur   . Herpes zoster    throat  . History of shingles    left throat 07/2007  . Hyperlipidemia   . Hypertension   . Memory changes 01/14/2013  . Neuromuscular disorder ([place])    peripheral neuropathy  . Nocturnal leg cramps 07/15/2014  . Peripheral neuropathy   . Polyneuropathy in other diseases classified elsewhere ([place]) 01/14/2013  . Severe mitral regurgitation   . Stroke (cerebrum) [facility])     Family History  Problem Relation Age of Onset  . Congestive Heart Failure Mother   . Stroke Father 58  . Heart  attack Brother 59  . Diabetes Brother    Scheduled Meds: . dextrose       Continuous Infusions: . morphine     PRN Meds:.acetaminophen **OR** acetaminophen, antiseptic oral rinse, diphenhydrAMINE, glycopyrrolate **OR** glycopyrrolate **OR** glycopyrrolate, haloperidol **OR** haloperidol **OR** haloperidol lactate, ipratropium-albuterol, LORazepam **OR** LORazepam **OR** LORazepam, morphine injection, ondansetron **OR** ondansetron (ZOFRAN) IV, polyvinyl alcohol Medications Prior to Admission:  Prior to Admission medications   Medication Sig Start Date End Date Taking? Authorizing Provider  acetaminophen (TYLENOL) 325 MG tablet Take 1-2 tablets (325-650 mg total) by mouth every 4 (four) hours as needed for mild pain. 11/11/18  Yes Love, Ivan Anchors, PA-C  amiodarone (PACERONE) 200 MG tablet Take 1 tablet (200 mg total) by mouth daily. 07/16/20  Yes Bhagat, Bhavinkumar, PA  apixaban (ELIQUIS) 2.5 MG TABS tablet Take 1 tablet (2.5 mg total) by mouth 2 (two) times daily. 07/15/20  Yes Bhagat, Bhavinkumar, PA  b complex vitamins tablet Take 1 tablet by mouth  daily.   Yes [provider]  baclofen 5 MG TABS Take 5 mg by mouth at bedtime. 06/01/20  Yes Ward Givens, NP  colchicine 0.6 MG tablet Take 1 tablet (0.6 mg total) by mouth daily. 07/16/20  Yes Bhagat, Bhavinkumar, PA  esomeprazole (NEXIUM) 20 MG capsule Take 20 mg by mouth daily.   Yes [provider]  furosemide (LASIX) 20 MG tablet Take 1 tablet (20 mg total) by mouth daily. 07/15/20  Yes Bhagat, Bhavinkumar, PA  gabapentin (NEURONTIN) 300 MG capsule Take 1 capsule (300 mg total) by mouth at bedtime. 06/01/20  Yes Ward Givens, NP  hydrocortisone 2.5 % cream Apply 1 application topically daily as needed (rash).  08/22/15  Yes [provider]  lisinopril (ZESTRIL) 20 MG tablet Take 1 tablet (20 mg total) by mouth daily. 07/15/20  Yes Bhagat, Bhavinkumar, PA  metoprolol tartrate (LOPRESSOR) 25 MG tablet Take 1 tablet (25  mg total) by mouth 2 (two) times daily. 07/15/20  Yes Bhagat, Bhavinkumar, PA  Polyethyl Glycol-Propyl Glycol (SYSTANE OP) Place 1 drop into both eyes 3 (three) times daily as needed (for dry eyes).    Yes [provider]  simvastatin (ZOCOR) 20 MG tablet Take 20 mg by mouth every evening.   Yes [provider]   Allergies  Allergen Reactions  . Septra [Sulfamethoxazole-Trimethoprim] Hives  . Lotensin [Benazepril Hcl] Other (See Comments)    NOT KNOWN    Vital Signs: BP (!) 97/58   Pulse 78   Temp (!) 95.2 F (35.1 C) (Temporal)   Resp 14   Wt 63.1 kg   SpO2 (!) 69%   BMI 25.44 kg/m          SpO2: SpO2: (!) 69 % O2 Device:SpO2: (!) 69 % O2 Flow Rate: .   IO: Intake/output summary: No intake or output data in the 24 hours ending Aug 03, 2020 1820  LBM:   Baseline Weight: Weight: 63.1 kg Most recent weight: Weight: 63.1 kg      Palliative Assessment/Data:PPS 10%     Time In: 1800 Time Out: 1835 Time Total: 35 minutes Greater than 50%  of this time was spent counseling and coordinating care related to the above assessment and plan.  Signed by: Lavena Bullion, NP   Please contact Palliative Medicine Team phone at [phone] for questions and concerns.  For individual provider: See Shea Evans

## 2020-07-31 NOTE — Progress Notes (Signed)
Non rebreather mask changed to nasal canula. Oxygen at 5l/min

## 2020-07-31 NOTE — ED Provider Notes (Addendum)
[name] EMERGENCY DEPARTMENT Provider Note   CSN: [id number] Arrival date & time: 08-12-20  1400     History Chief Complaint  Patient presents with  . Bradycardia    Morgan Boyer is a 87 y.o. female.  Patient presents via EMS from [facility] with progressive general weakness in the past few days. EMS notes hr in 30s, and decreased alertness. Pt unresponsive to questions - level 5 caveat.  EMS/family note DNR wishes - no cpr, no intubation. Pt w relatively poor po intake in past few days. No report of specific pain or specific complaint. No report of trauma/fall.   The history is provided by the patient, a relative and the EMS personnel. The history is limited by the condition of the patient.       Past Medical History:  Diagnosis Date  . Abnormality of gait   . Adenomatous colon polyp   . Arthritis   . Atrial fibrillation ([place])   . Back pain   . Cancer ([place])    skin cancers  . Depression   . Diverticulosis   . DJD (degenerative joint disease) of knee   . Esophageal reflux   . H/O hiatal hernia   . Heart murmur   . Herpes zoster    throat  . History of shingles    left throat 07/2007  . Hyperlipidemia   . Hypertension   . Memory changes 01/14/2013  . Neuromuscular disorder ([place])    peripheral neuropathy  . Nocturnal leg cramps 07/15/2014  . Peripheral neuropathy   . Polyneuropathy in other diseases classified elsewhere ([place]) 01/14/2013  . Severe mitral regurgitation   . Stroke (cerebrum) [facility])     Patient Active Problem List   Diagnosis Date Noted  . Unstable angina ([place]) 07/06/2020  . Paroxysmal atrial fibrillation ([place]) 07/06/2020  . Severe mitral regurgitation 07/06/2020  . Congestive heart failure (CHF) ([place]) 07/06/2020  . Chest pain at rest 07/05/2020  . Hemiparesis affecting left side as late effect of stroke ([place])   . Neuropathy   . Acute blood loss anemia   . Hypoalbuminemia due to protein-calorie malnutrition ([place])   . Embolic  stroke ([place]) [id number]  . Esophageal stricture 10/30/2018  . Family hx-stroke 10/27/2018  . Advanced age 06/27/2018  . 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 Thrombocytopenia ([place])   . Essential hypertension   . Hyperlipidemia   . Hypokalemia   . Lumbar stenosis with neurogenic claudication 07/03/2017  . Near syncope 11/10/2014  . Nocturnal leg cramps 07/15/2014  . Abnormality of gait 07/15/2014  . Polyneuropathy in other diseases classified elsewhere ([place]) 01/14/2013  . Memory changes 01/14/2013    Past Surgical History:  Procedure Laterality Date  . ABDOMINAL HYSTERECTOMY    . APPENDECTOMY    . BACK SURGERY  07/2017  . BUBBLE STUDY  10/31/2018   Procedure: BUBBLE STUDY;  Surgeon: Fay Records, MD;  Location: [facility];  Service: Cardiovascular;;  . BUBBLE STUDY  07/08/2020   Procedure: BUBBLE STUDY;  Surgeon: Buford Dresser, MD;  Location: [facility] ENDOSCOPY;  Service: Cardiovascular;;  . COLON SURGERY     2001 diverticulitis with infection ..drained surgically .  Marland Kitchen EYE SURGERY     cat ext bil   . JOINT REPLACEMENT     right  . KNEE ARTHROSCOPY  11/08/2011  . LOOP RECORDER INSERTION N/A 10/31/2018   Procedure: LOOP RECORDER INSERTION;  Surgeon: Constance Haw, MD;  Location: [facility]  INVASIVE CV LAB;  Service: Cardiovascular;  Laterality: N/A;  . RIGHT/LEFT HEART CATH AND CORONARY ANGIOGRAPHY N/A 07/07/2020   Procedure: RIGHT/LEFT HEART CATH AND CORONARY ANGIOGRAPHY;  Surgeon: Troy Sine, MD;  Location: [facility] CV LAB;  Service: Cardiovascular;  Laterality: N/A;  . TEE WITHOUT CARDIOVERSION N/A 10/31/2018   Procedure: TRANSESOPHAGEAL ECHOCARDIOGRAM (TEE);  Surgeon: Fay Records, MD;  Location: [facility];  Service: Cardiovascular;  Laterality: N/A;  . TEE WITHOUT CARDIOVERSION N/A 07/08/2020   Procedure: TRANSESOPHAGEAL ECHOCARDIOGRAM (TEE);  Surgeon: Buford Dresser, MD;  Location: [facility] ENDOSCOPY;  Service:  Cardiovascular;  Laterality: N/A;  . TONSILLECTOMY    . TOTAL KNEE ARTHROPLASTY  11/07/2011   Procedure: TOTAL KNEE ARTHROPLASTY;  Surgeon: Ninetta Lights, MD;  Location: [facility];  Service: Orthopedics;  Laterality: Left;  . TUBAL LIGATION    . WRIST SURGERY     for skin cancer     OB History   No obstetric history on file.     Family History  Problem Relation Age of Onset  . Congestive Heart Failure Mother   . Stroke Father 61  . Heart attack Brother 67  . Diabetes Brother     Social History   Tobacco Use  . Smoking status: Never Smoker  . Smokeless tobacco: Never Used  Vaping Use  . Vaping Use: Never used  Substance Use Topics  . Alcohol use: No  . Drug use: No    Home Medications Prior to Admission medications   Medication Sig Start Date End Date Taking? Authorizing Provider  acetaminophen (TYLENOL) 325 MG tablet Take 1-2 tablets (325-650 mg total) by mouth every 4 (four) hours as needed for mild pain. 11/11/18   Love, Ivan Anchors, PA-C  amiodarone (PACERONE) 200 MG tablet Take 1 tablet (200 mg total) by mouth daily. 07/16/20   Bhagat, Crista Luria, PA  apixaban (ELIQUIS) 2.5 MG TABS tablet Take 1 tablet (2.5 mg total) by mouth 2 (two) times daily. 07/15/20   Leanor Kail, PA  b complex vitamins tablet Take 1 tablet by mouth daily.    [provider]  baclofen 5 MG TABS Take 5 mg by mouth at bedtime. 06/01/20   Ward Givens, NP  colchicine 0.6 MG tablet Take 1 tablet (0.6 mg total) by mouth daily. 07/16/20   Bhagat, Crista Luria, PA  esomeprazole (NEXIUM) 20 MG capsule Take 20 mg by mouth daily.    [provider]  furosemide (LASIX) 20 MG tablet Take 1 tablet (20 mg total) by mouth daily. 07/15/20   Bhagat, Crista Luria, PA  gabapentin (NEURONTIN) 300 MG capsule Take 1 capsule (300 mg total) by mouth at bedtime. 06/01/20   Ward Givens, NP  hydrocortisone 2.5 % cream Apply 1 application topically daily as needed (rash).  08/22/15   [provider]  lisinopril (ZESTRIL) 20 MG tablet Take 1 tablet (20 mg total) by mouth daily. 07/15/20   Bhagat, Crista Luria, PA  metoprolol tartrate (LOPRESSOR) 25 MG tablet Take 1 tablet (25 mg total) by mouth 2 (two) times daily. 07/15/20   Bhagat, Crista Luria, PA  Polyethyl Glycol-Propyl Glycol (SYSTANE OP) Place 1 drop into both eyes 3 (three) times daily as needed (for dry eyes).     [provider]  simvastatin (ZOCOR) 20 MG tablet Take 20 mg by mouth every evening.    [provider]    Allergies    Septra [sulfamethoxazole-trimethoprim] and Lotensin [benazepril hcl]  Review of Systems   Review of Systems  Unable to perform ROS:  Patient unresponsive  Constitutional: Negative for fever.  level 5 caveat - pt unresponsive    Physical Exam Updated Vital Signs BP (!) 45/17   Pulse 61   Temp (!) 95.2 F (35.1 C) (Temporal)   Resp 16   SpO2 (!) 78%   Physical Exam Vitals and nursing note reviewed.  Constitutional:      Appearance: She is well-developed.     Comments: Lethargic, weak/frail appearing. Bradycardic and hypotensive.   HENT:     Head: Atraumatic.     Nose: Nose normal.     Mouth/Throat:     Mouth: Mucous membranes are moist.  Eyes:     General: No scleral icterus.    Conjunctiva/sclera: Conjunctivae normal.     Pupils: Pupils are equal, round, and reactive to light.  Neck:     Vascular: No carotid bruit.     Trachea: No tracheal deviation.  Cardiovascular:     Rate and Rhythm: Regular rhythm. Bradycardia present.     Heart sounds: Murmur heard.  No friction rub. No gallop.   Pulmonary:     Effort: Pulmonary effort is normal.     Breath sounds: Normal breath sounds.  Abdominal:     General: There is no distension.     Palpations: Abdomen is soft. There is no mass.     Tenderness: There is no abdominal tenderness.  Genitourinary:    Comments: No cva tenderness.  Musculoskeletal:        General: No swelling or tenderness.     Cervical back:  Normal range of motion and neck supple. No rigidity. No muscular tenderness.  Skin:    General: Skin is warm and dry.     Findings: No rash.  Neurological:     Comments: Lethargic, is arousable, will answer with one word answer. Moves bilateral extremity minimally.   Psychiatric:     Comments: Lethargic.      ED Results / Procedures / Treatments   Labs (all labs ordered are listed, but only abnormal results are displayed) Results for orders placed or performed during the hospital encounter of 07/19/2020  CBC  Result Value Ref Range   WBC 13.9 (H) 4.0 - 10.5 K/uL   RBC 4.65 3.87 - 5.11 MIL/uL   Hemoglobin 13.3 12.0 - 15.0 g/dL   HCT 43.0 36.0 - 46.0 %   MCV 92.5 80.0 - 100.0 fL   MCH 28.6 26.0 - 34.0 pg   MCHC 30.9 30.0 - 36.0 g/dL   RDW 16.7 (H) 11.5 - 15.5 %   Platelets 198 150 - 400 K/uL   nRBC 0.5 (H) 0.0 - 0.2 %  Comprehensive metabolic panel  Result Value Ref Range   Sodium 135 135 - 145 mmol/L   Potassium 4.6 3.5 - 5.1 mmol/L   Chloride 106 98 - 111 mmol/L   CO2 13 (L) 22 - 32 mmol/L   Glucose, Bld 70 70 - 99 mg/dL   BUN 42 (H) 8 - 23 mg/dL   Creatinine, Ser 3.16 (H) 0.44 - 1.00 mg/dL   Calcium 6.9 (L) 8.9 - 10.3 mg/dL   Total Protein 4.6 (L) 6.5 - 8.1 g/dL   Albumin 2.7 (L) 3.5 - 5.0 g/dL   AST PENDING 15 - 41 U/L   ALT 246 (H) 0 - 44 U/L   Alkaline Phosphatase 63 38 - 126 U/L   Total Bilirubin 2.0 (H) 0.3 - 1.2 mg/dL   GFR, Estimated 14 (L) >60 mL/min   Anion gap 16 (H)  5 - 15  I-stat chem 8, ED (not at [facility] or [facility])  Result Value Ref Range   Sodium 135 135 - 145 mmol/L   Potassium 4.6 3.5 - 5.1 mmol/L   Chloride 107 98 - 111 mmol/L   BUN 44 (H) 8 - 23 mg/dL   Creatinine, Ser 3.30 (H) 0.44 - 1.00 mg/dL   Glucose, Bld 64 (L) 70 - 99 mg/dL   Calcium, Ion 0.76 (LL) 1.15 - 1.40 mmol/L   TCO2 17 (L) 22 - 32 mmol/L   Hemoglobin 13.6 12.0 - 15.0 g/dL   HCT 40.0 36.0 - 46.0 %   Comment NOTIFIED PHYSICIAN   CBG monitoring, ED  Result Value Ref Range    Glucose-Capillary 27 (LL) 70 - 99 mg/dL   Comment 1 Notify RN   CBG monitoring, ED  Result Value Ref Range   Glucose-Capillary 41 (LL) 70 - 99 mg/dL  CBG monitoring, ED  Result Value Ref Range   Glucose-Capillary 57 (L) 70 - 99 mg/dL  CBG monitoring, ED  Result Value Ref Range   Glucose-Capillary 206 (H) 70 - 99 mg/dL  Troponin I (High Sensitivity)  Result Value Ref Range   Troponin I (High Sensitivity) 59 (H) <18 ng/L   CARDIAC CATHETERIZATION  Result Date: 07/07/2020  Ost LAD to Prox LAD lesion is 40% stenosed.  Severe mitral annular calcification. Mild nonobstructive CAD with 40% smooth proximal LAD stenosis with mild irregularity,and minimal luminal irregularity in the left circumflex and  dominant RCA Moderate right heart pressure elevation with peak V wave in the 36-40 range on the initial PW tracing, consistent with the patient's significant mitral regurgitation. Very mild aortic stenosis. RECOMMENDATION: Patient will be undergoing a TEE tentatively scheduled for tomorrow for further evaluation of her mitral regurgitation.   Portable chest x-ray 1 view  Result Date: 07/05/2020 CLINICAL DATA:  Chest pain at rest EXAM: PORTABLE CHEST 1 VIEW COMPARISON:  12/01/2015 FINDINGS: Cardiac enlargement. No vascular congestion, edema, or consolidation. Calcification in the mitral valve annulus. Loop recorder. Calcification of the aorta. No pleural effusions. No pneumothorax. Mediastinal contours appear intact. IMPRESSION: Cardiac enlargement. No evidence of active pulmonary disease. Electronically Signed   By: Lucienne Capers M.D.   On: 07/05/2020 19:27   ECHOCARDIOGRAM COMPLETE  Result Date: 07/06/2020    ECHOCARDIOGRAM REPORT   Patient Name:   AARTHI MAZZIOTTI Date of Exam: 07/06/2020 Medical Rec #:  [id number]       Height:       62.0 in Accession #:    [id number]      Weight:       143.5 lb Date of Birth:  [birth year]        BSA:          1.660 m Patient Age:    89 years        BP:           110/61  mmHg Patient Gender: F               HR:           68 bpm. Exam Location:  Inpatient Procedure: 2D Echo, Cardiac Doppler and Color Doppler Indications:    Mitral valve disorder  History:        Patient has prior history of Echocardiogram examinations, most                 recent 07/22/2019. Mitral Valve Disease, Arrythmias:Atrial  Fibrillation; Risk Factors:Hypertension and Dyslipidemia.  Sonographer:    Clayton Lefort RDCS (AE) Referring Phys: [place]  1. Left ventricular ejection fraction, by estimation, is 60 to 65%. The left ventricle has normal function. The left ventricle has no regional wall motion abnormalities. There is moderate left ventricular hypertrophy. Left ventricular diastolic function  could not be evaluated.  2. The pericardial effusion is posterior to the left ventricle.  3. The mitral annulus is calcified and the posterior leafelt appears restricted at the base and the tip of the leaflet prolapses. There is severe eccentric anterior and medially directed mitral regurgitation. The mitral valve is degenerative. Severe mitral valve regurgitation.  4. Tricuspid valve regurgitation is mild to moderate.  5. The aortic valve is tricuspid. Aortic valve regurgitation is not visualized. Moderate aortic valve stenosis. Aortic valve area, by VTI measures 1.22 cm. AVA by planimetry was 1.31 cm2.  6. Left atrial size was severely dilated.  7. Right ventricular systolic function is normal. The right ventricular size is normal. There is severely elevated pulmonary artery systolic pressure.  8. The inferior vena cava is normal in size with <50% respiratory variability, suggesting right atrial pressure of 8 mmHg. Comparison(s): Changes from prior study are noted. 07/22/2019: LVEF 55-60%, moderate to severe MR. FINDINGS  Left Ventricle: Left ventricular ejection fraction, by estimation, is 60 to 65%. The left ventricle has normal function. The left ventricle has no regional wall  motion abnormalities. The left ventricular internal cavity size was normal in size. There is  moderate left ventricular hypertrophy. Left ventricular diastolic function could not be evaluated due to mitral annular calcification (moderate or greater). Left ventricular diastolic function could not be evaluated. Right Ventricle: The right ventricular size is normal. No increase in right ventricular wall thickness. Right ventricular systolic function is normal. There is severely elevated pulmonary artery systolic pressure. The tricuspid regurgitant velocity is 3.87 m/s, and with an assumed right atrial pressure of 8 mmHg, the estimated right ventricular systolic pressure is [id number] mmHg. Left Atrium: Left atrial size was severely dilated. Right Atrium: Right atrial size was normal in size. Pericardium: Trivial pericardial effusion is present. The pericardial effusion is posterior to the left ventricle. Mitral Valve: The mitral annulus is calcified and the posterior leafelt appears restricted at the base and the tip of the leaflet prolapses. There is severe eccentric anterior and medially directed mitral regurgitation. The mitral valve is degenerative in appearance. Mild to moderate mitral annular calcification. Severe mitral valve regurgitation, with anteriorly-directed jet. MV peak gradient, 13.2 mmHg. The mean mitral valve gradient is 2.0 mmHg. Tricuspid Valve: The tricuspid valve is not well visualized. Tricuspid valve regurgitation is mild to moderate. Aortic Valve: The aortic valve is tricuspid. Aortic valve regurgitation is not visualized. Moderate aortic stenosis is present. Aortic valve mean gradient measures 8.0 mmHg. Aortic valve peak gradient measures 14.6 mmHg. Aortic valve area, by VTI measures 1.22 cm. Pulmonic Valve: The pulmonic valve was normal in structure. Pulmonic valve regurgitation is not visualized. Aorta: The aortic root and ascending aorta are structurally normal, with no evidence of dilitation.  Venous: The inferior vena cava is normal in size with less than 50% respiratory variability, suggesting right atrial pressure of 8 mmHg. IAS/Shunts: No atrial level shunt detected by color flow Doppler.  LEFT VENTRICLE PLAX 2D LVIDd:         4.70 cm LVIDs:         2.80 cm LV PW:  1.50 cm LV IVS:        1.40 cm LVOT diam:     1.90 cm LV SV:         39 LV SV Index:   23 LVOT Area:     2.84 cm  RIGHT VENTRICLE            IVC RV Basal diam:  3.40 cm    IVC diam: 1.80 cm RV S prime:     7.01 cm/s TAPSE (M-mode): 1.7 cm LEFT ATRIUM           Index       RIGHT ATRIUM           Index LA diam:      4.00 cm 2.41 cm/m  RA Area:     12.80 cm LA Vol (A4C): 99.6 ml 59.99 ml/m RA Volume:   25.30 ml  15.24 ml/m  AORTIC VALVE AV Area (Vmax):    1.21 cm AV Area (Vmean):   1.16 cm AV Area (VTI):     1.22 cm AV Vmax:           191.00 cm/s AV Vmean:          130.000 cm/s AV VTI:            0.315 m AV Peak Grad:      14.6 mmHg AV Mean Grad:      8.0 mmHg LVOT Vmax:         81.60 cm/s LVOT Vmean:        53.200 cm/s LVOT VTI:          0.136 m LVOT/AV VTI ratio: 0.43  AORTA Ao Root diam: 3.30 cm Ao Asc diam:  3.40 cm MITRAL VALVE                 TRICUSPID VALVE MV Area (PHT): 2.97 cm      TR Peak grad:   59.9 mmHg MV Peak grad:  13.2 mmHg     TR Vmax:        387.00 cm/s MV Mean grad:  2.0 mmHg MV Vmax:       1.82 m/s      SHUNTS MV Vmean:      67.7 cm/s     Systemic VTI:  0.14 m MR Peak grad:    92.9 mmHg   Systemic Diam: 1.90 cm MR Mean grad:    56.0 mmHg MR Vmax:         482.00 cm/s MR Vmean:        349.0 cm/s MR PISA:         6.28 cm MR PISA Eff ROA: 52 mm MR PISA Radius:  1.00 cm Lyman Bishop MD Electronically signed by Lyman Bishop MD Signature Date/Time: 07/06/2020/4:20:38 PM    Final    ECHO TEE  Result Date: 07/08/2020    TRANSESOPHOGEAL ECHO REPORT   Patient Name:   DEL GIM Date of Exam: 07/08/2020 Medical Rec #:  [id number]       Height:       62.0 in Accession #:    [id number]      Weight:       142.0 lb  Date of Birth:  1931/10/29        BSA:          1.653 m Patient Age:    5 years        BP:           143/91  mmHg Patient Gender: F               HR:           92 bpm. Exam Location:  Inpatient Procedure: Transesophageal Echo, Color Doppler, Cardiac Doppler and 3D Echo Indications:     Mitral Regurgitation  History:         Patient has prior history of Echocardiogram examinations, most                  recent 07/06/2020. Arrythmias:Atrial Fibrillation; Risk                  Factors:Dyslipidemia and Hypertension.  Sonographer:     Mikki Santee RDCS (AE) Referring Phys:  [place] Diagnosing Phys: Buford Dresser MD PROCEDURE: After discussion of the risks and benefits of a TEE, an informed consent was obtained from the patient. The transesophogeal probe was passed without difficulty through the esophogus of the patient. Sedation performed by different physician. The patient was monitored while under deep sedation. Anesthestetic sedation was provided intravenously by Anesthesiology: 234.56mg  of Propofol, 60mg  of Lidocaine. Image quality was good. The patient developed no complications during the procedure. IMPRESSIONS  1. Left ventricular ejection fraction, by estimation, is 60 to 65%. The left ventricle has normal function. The left ventricle has no regional wall motion abnormalities. There is the interventricular septum is flattened in systole and diastole, consistent with right ventricular pressure and volume overload.  2. Right ventricular systolic function is normal. The right ventricular size is normal. There is severely elevated pulmonary artery systolic pressure. The estimated right ventricular systolic pressure is [id number] mmHg.  3. Left atrial size was severely dilated. No left atrial/left atrial appendage thrombus was detected.  4. Right atrial size was severely dilated.  5. A small pericardial effusion is present.  6. Flail P1/P2 with severe anterior eccentric MR jet.. The mitral valve is  degenerative. Severe mitral valve regurgitation. The mean mitral valve gradient is 3.0 mmHg. Moderate mitral annular calcification.  7. Tricuspid valve regurgitation is moderate to severe.  8. The aortic valve is tricuspid. There is mild calcification of the aortic valve. There is mild thickening of the aortic valve. Aortic valve regurgitation is not visualized. No aortic stenosis is present.  9. There is Moderate (Grade III) plaque involving the transverse and descending aorta. Conclusion(s)/Recommendation(s): Severe eccentric MR with flail P1/P2 segments. Moderate to (likely) severe tricuspid regurgitation with severely elevated RVSP. FINDINGS  Left Ventricle: Left ventricular ejection fraction, by estimation, is 60 to 65%. The left ventricle has normal function. The left ventricle has no regional wall motion abnormalities. The left ventricular internal cavity size was normal in size. The interventricular septum is flattened in systole and diastole, consistent with right ventricular pressure and volume overload. Right Ventricle: The right ventricular size is normal. No increase in right ventricular wall thickness. Right ventricular systolic function is normal. There is severely elevated pulmonary artery systolic pressure. The tricuspid regurgitant velocity is 3.74 m/s, and with an assumed right atrial pressure of 8 mmHg, the estimated right ventricular systolic pressure is [id number] mmHg. Left Atrium: Left atrial size was severely dilated. No left atrial/left atrial appendage thrombus was detected. Right Atrium: Right atrial size was severely dilated. Pericardium: A small pericardial effusion is present. Mitral Valve: Flail P1/P2 with severe anterior eccentric MR jet. The mitral valve is degenerative in appearance. There is mild thickening of the mitral valve leaflet(s). There is mild calcification of the mitral valve leaflet(s). Moderate mitral  annular calcification. Severe mitral valve regurgitation. MV peak  gradient, 8.9 mmHg. The mean mitral valve gradient is 3.0 mmHg. Tricuspid Valve: The tricuspid valve is normal in structure. Tricuspid valve regurgitation is moderate to severe. No evidence of tricuspid stenosis. Aortic Valve: The aortic valve is tricuspid. There is mild calcification of the aortic valve. There is mild thickening of the aortic valve. There is moderate aortic valve annular calcification. Aortic valve regurgitation is not visualized. No aortic stenosis is present. Pulmonic Valve: The pulmonic valve was normal in structure. Pulmonic valve regurgitation is trivial. Aorta: The aortic root and ascending aorta are structurally normal, with no evidence of dilitation. There is moderate (Grade III) plaque involving the transverse and descending aorta. IAS/Shunts: No atrial level shunt detected by color flow Doppler.  AORTIC VALVE LVOT Vmax:   81.90 cm/s LVOT Vmean:  47.100 cm/s LVOT VTI:    0.106 m MITRAL VALVE                 TRICUSPID VALVE MV Peak grad: 8.9 mmHg       TR Peak grad:   56.0 mmHg MV Mean grad: 3.0 mmHg       TR Vmax:        374.00 cm/s MV Vmax:      1.49 m/s MV Vmean:     75.5 cm/s      SHUNTS MR Peak grad:    75.0 mmHg   Systemic VTI: 0.11 m MR Mean grad:    49.0 mmHg MR Vmax:         433.00 cm/s MR Vmean:        334.0 cm/s MR PISA:         6.28 cm MR PISA Eff ROA: 56 mm MR PISA Radius:  1.00 cm Buford Dresser MD Electronically signed by Buford Dresser MD Signature Date/Time: 07/08/2020/6:24:57 PM    Final    CUP PACEART REMOTE DEVICE CHECK  Result Date: 07/26/2020 ILR summary report received. Battery status OK. Normal device function. No new symptom, tachy, brady, or pause episodes. No new AF episodes. Monthly summary reports and ROV/PRN   EKG EKG Interpretation  Date/Time:  08/08/2020 14:06:44 EDT Ventricular Rate:  31 PR Interval:    QRS Duration: 151 QT Interval:  677 QTC Calculation: 487 R Axis:   -82 Text Interpretation: Junctional bradycardia  Junctional bradycardia Left bundle branch block Non-specific ST-t changes Confirmed by Lajean Saver [phone]) on 08-08-2020 3:19:13 PM   Radiology DG Chest Port 1 View  Result Date: 08-08-20 CLINICAL DATA:  Bradycardia EXAM: PORTABLE CHEST 1 VIEW COMPARISON:  July 05, 2020 FINDINGS: There is no edema or airspace opacity. There is cardiomegaly with pulmonary vascularity normal. There is aortic atherosclerosis. There is mitral annulus calcification. Loop recorder noted on the left. No adenopathy. No bone lesions. IMPRESSION: Stable cardiomegaly. Mitral annulus calcification again noted. No edema or airspace opacity. Loop recorder on left. Aortic Atherosclerosis (ICD10-I70.0). Electronically Signed   By: Lowella Grip III M.D.   On: Aug 08, 2020 15:12    Procedures Procedures   Medications Ordered in ED Medications  atropine 1 MG/10ML injection 0.4 mg (0.4 mg Intravenous Given Aug 08, 2020 1436)  calcium gluconate inj 10% (1 g) URGENT USE ONLY! (1 g Intravenous Given Aug 08, 2020 1436)    ED Course  I have reviewed the triage vital signs and the nursing notes.  Pertinent labs & imaging results that were available during my care of the patient were reviewed by me and considered in my  medical decision making (see chart for details).    MDM Rules/Calculators/A&P                         Iv ns bolus. Continuous pulse ox and cardiac monitoring.   Reviewed nursing notes and prior charts for additional history.   ECG with junctional brady/hypotension.  Hr 20's, given atropine and ca while await istat/lab. Hr improved to 40s.  Iv ns bolus.   Labs reviewed/interpreted by me - k normal.   CXR reviewed/interpreted by me - no pna.   Discussed/clarified wishes with pt/son - he confirms dnr wishes, no cpr, no intubation or vent. Also indicates no procedures/procedural intervention such as pacemaker or mitraclip procedure. Indicates meds, fluids, supportive care, ok.   Glucose low. D50, still low. D50,  d10 gtt.   Cardiology consulted - discussed w Dr Marlou Porch - agrees with plan of admit to medicine, supportive/comfort care, no indication of emergent cardiology intervention under the current circumstances/pt goals of care, etc.   Hospitalists consulted for admission.  CRITICAL CARE RE: junctional bradycardia w hypotension Performed by: Mirna Mires Total critical care time: 40 minutes Critical care time was exclusive of separately billable procedures and treating other patients. Critical care was necessary to treat or prevent imminent or life-threatening deterioration. Critical care was time spent personally by me on the following activities: development of treatment plan with patient and/or surrogate as well as nursing, discussions with consultants, evaluation of patient's response to treatment, examination of patient, obtaining history from patient or surrogate, ordering and performing treatments and interventions, ordering and review of laboratory studies, ordering and review of radiographic studies, pulse oximetry and re-evaluation of patient's condition.  Discussed with hospitalist - he indicates feels like will pass in next couple hrs and so feels inpatient admit likely not beneficial, requests palliative med consult - palliative med team consulted. Signed out to Dr Regenia Skeeter.      Final Clinical Impression(s) / ED Diagnoses Final diagnoses:  None    Rx / DC Orders ED Discharge Orders    None           Lajean Saver, MD 08/11/2020 1620

## 2020-07-31 DEATH — deceased

## 2020-08-01 ENCOUNTER — Ambulatory Visit: Payer: PPO | Admitting: Cardiovascular Disease

## 2020-08-01 LAB — GLUCOSE, CAPILLARY: Glucose-Capillary: 27 mg/dL — CL (ref 70–99)

## 2020-08-31 NOTE — Discharge Summary (Signed)
Death Summary  Morgan Boyer [id number] DOB: [birth year] DOA: 08/10/20  PCP: Aretta Nip, MD  Admit date: Aug 10, 2020 Date of Death:08/10/2020 Time of Death: 20:45 Notification: Rankins, Bill Salinas, MD notified of death of 2020-08-15   History of present illness:  Morgan Boyer is a 87 y.o. female with a history of hypertension, hyperlipidemia, CVA in 10/2018, atrial fibrillation on Eliquis, severe mitral regurgitation, CKD stage IIIb, dementia, peripheral neuropathy, and GERD Morgan Boyer presented in respiratory distress from nursing facility [facility] did not improve after patient was noted to be hypothermic, heart rates in the 30s, blood pressures as low as 34/22, and O2 saturations 45 to 78% on a nonrebreather reading.  Despite patient being given IV fluids, dextrose for hypoglycemia, and atropine for bradycardia her symptoms did not improve.  Upon her son's arrival he confirmed that the patient was to be DNR.  He made known his mother wanted no aggressive care or interventions and just wanted to be made comfortable.  Palliative care had consulted and the patient was switched to comfort care measures only.  Final Diagnoses:  1. Severe mitral regurgitation 2. Flail mitral valve leaflet 3. Bradycardia 3. Acute on chronic diastolic congestive heart failure 4. Acute respiratory failure with hypoxia  5. Acute kidney injury superimposed on chronic kidney disease stage III 6. Paroxysmal atrial fibrillation 7. Hypoglycemia  8. Hypothermia   The results of significant diagnostics from this hospitalization (including imaging, microbiology, ancillary and laboratory) are listed below for reference.    Significant Diagnostic Studies: CARDIAC CATHETERIZATION  Result Date: 07/07/2020  Ost LAD to Prox LAD lesion is 40% stenosed.  Severe mitral annular calcification. Mild nonobstructive CAD with 40% smooth proximal LAD stenosis with mild irregularity,and minimal luminal  irregularity in the left circumflex and  dominant RCA Moderate right heart pressure elevation with peak V wave in the 36-40 range on the initial PW tracing, consistent with the patient's significant mitral regurgitation. Very mild aortic stenosis. RECOMMENDATION: Patient will be undergoing a TEE tentatively scheduled for tomorrow for further evaluation of her mitral regurgitation.   DG Chest Port 1 View  Result Date: 08-10-20 CLINICAL DATA:  Bradycardia EXAM: PORTABLE CHEST 1 VIEW COMPARISON:  July 05, 2020 FINDINGS: There is no edema or airspace opacity. There is cardiomegaly with pulmonary vascularity normal. There is aortic atherosclerosis. There is mitral annulus calcification. Loop recorder noted on the left. No adenopathy. No bone lesions. IMPRESSION: Stable cardiomegaly. Mitral annulus calcification again noted. No edema or airspace opacity. Loop recorder on left. Aortic Atherosclerosis (ICD10-I70.0). Electronically Signed   By: Lowella Grip III M.D.   On: 08/10/2020 15:12   Portable chest x-ray 1 view  Result Date: 07/05/2020 CLINICAL DATA:  Chest pain at rest EXAM: PORTABLE CHEST 1 VIEW COMPARISON:  12/01/2015 FINDINGS: Cardiac enlargement. No vascular congestion, edema, or consolidation. Calcification in the mitral valve annulus. Loop recorder. Calcification of the aorta. No pleural effusions. No pneumothorax. Mediastinal contours appear intact. IMPRESSION: Cardiac enlargement. No evidence of active pulmonary disease. Electronically Signed   By: Lucienne Capers M.D.   On: 07/05/2020 19:27   ECHOCARDIOGRAM COMPLETE  Result Date: 07/06/2020    ECHOCARDIOGRAM REPORT   Patient Name:   Morgan Boyer Date of Exam: 07/06/2020 Medical Rec #:  [id number]       Height:       62.0 in Accession #:    [id number]      Weight:       143.5 lb Date of Birth:  [birth year]        BSA:          1.660 m Patient Age:    52 years        BP:           110/61 mmHg Patient Gender: F               HR:           68  bpm. Exam Location:  Inpatient Procedure: 2D Echo, Cardiac Doppler and Color Doppler Indications:    Mitral valve disorder  History:        Patient has prior history of Echocardiogram examinations, most                 recent 07/22/2019. Mitral Valve Disease, Arrythmias:Atrial                 Fibrillation; Risk Factors:Hypertension and Dyslipidemia.  Sonographer:    Clayton Lefort RDCS (AE) Referring Phys: [place]  1. Left ventricular ejection fraction, by estimation, is 60 to 65%. The left ventricle has normal function. The left ventricle has no regional wall motion abnormalities. There is moderate left ventricular hypertrophy. Left ventricular diastolic function  could not be evaluated.  2. The pericardial effusion is posterior to the left ventricle.  3. The mitral annulus is calcified and the posterior leafelt appears restricted at the base and the tip of the leaflet prolapses. There is severe eccentric anterior and medially directed mitral regurgitation. The mitral valve is degenerative. Severe mitral valve regurgitation.  4. Tricuspid valve regurgitation is mild to moderate.  5. The aortic valve is tricuspid. Aortic valve regurgitation is not visualized. Moderate aortic valve stenosis. Aortic valve area, by VTI measures 1.22 cm. AVA by planimetry was 1.31 cm2.  6. Left atrial size was severely dilated.  7. Right ventricular systolic function is normal. The right ventricular size is normal. There is severely elevated pulmonary artery systolic pressure.  8. The inferior vena cava is normal in size with <50% respiratory variability, suggesting right atrial pressure of 8 mmHg. Comparison(s): Changes from prior study are noted. 07/22/2019: LVEF 55-60%, moderate to severe MR. FINDINGS  Left Ventricle: Left ventricular ejection fraction, by estimation, is 60 to 65%. The left ventricle has normal function. The left ventricle has no regional wall motion abnormalities. The left ventricular internal  cavity size was normal in size. There is  moderate left ventricular hypertrophy. Left ventricular diastolic function could not be evaluated due to mitral annular calcification (moderate or greater). Left ventricular diastolic function could not be evaluated. Right Ventricle: The right ventricular size is normal. No increase in right ventricular wall thickness. Right ventricular systolic function is normal. There is severely elevated pulmonary artery systolic pressure. The tricuspid regurgitant velocity is 3.87 m/s, and with an assumed right atrial pressure of 8 mmHg, the estimated right ventricular systolic pressure is 67.1 mmHg. Left Atrium: Left atrial size was severely dilated. Right Atrium: Right atrial size was normal in size. Pericardium: Trivial pericardial effusion is present. The pericardial effusion is posterior to the left ventricle. Mitral Valve: The mitral annulus is calcified and the posterior leafelt appears restricted at the base and the tip of the leaflet prolapses. There is severe eccentric anterior and medially directed mitral regurgitation. The mitral valve is degenerative in appearance. Mild to moderate mitral annular calcification. Severe mitral valve regurgitation, with anteriorly-directed jet. MV peak gradient, 13.2 mmHg. The mean mitral valve gradient is 2.0 mmHg. Tricuspid Valve: The  tricuspid valve is not well visualized. Tricuspid valve regurgitation is mild to moderate. Aortic Valve: The aortic valve is tricuspid. Aortic valve regurgitation is not visualized. Moderate aortic stenosis is present. Aortic valve mean gradient measures 8.0 mmHg. Aortic valve peak gradient measures 14.6 mmHg. Aortic valve area, by VTI measures 1.22 cm. Pulmonic Valve: The pulmonic valve was normal in structure. Pulmonic valve regurgitation is not visualized. Aorta: The aortic root and ascending aorta are structurally normal, with no evidence of dilitation. Venous: The inferior vena cava is normal in size with  less than 50% respiratory variability, suggesting right atrial pressure of 8 mmHg. IAS/Shunts: No atrial level shunt detected by color flow Doppler.  LEFT VENTRICLE PLAX 2D LVIDd:         4.70 cm LVIDs:         2.80 cm LV PW:         1.50 cm LV IVS:        1.40 cm LVOT diam:     1.90 cm LV SV:         39 LV SV Index:   23 LVOT Area:     2.84 cm  RIGHT VENTRICLE            IVC RV Basal diam:  3.40 cm    IVC diam: 1.80 cm RV S prime:     7.01 cm/s TAPSE (M-mode): 1.7 cm LEFT ATRIUM           Index       RIGHT ATRIUM           Index LA diam:      4.00 cm 2.41 cm/m  RA Area:     12.80 cm LA Vol (A4C): 99.6 ml 59.99 ml/m RA Volume:   25.30 ml  15.24 ml/m  AORTIC VALVE AV Area (Vmax):    1.21 cm AV Area (Vmean):   1.16 cm AV Area (VTI):     1.22 cm AV Vmax:           191.00 cm/s AV Vmean:          130.000 cm/s AV VTI:            0.315 m AV Peak Grad:      14.6 mmHg AV Mean Grad:      8.0 mmHg LVOT Vmax:         81.60 cm/s LVOT Vmean:        53.200 cm/s LVOT VTI:          0.136 m LVOT/AV VTI ratio: 0.43  AORTA Ao Root diam: 3.30 cm Ao Asc diam:  3.40 cm MITRAL VALVE                 TRICUSPID VALVE MV Area (PHT): 2.97 cm      TR Peak grad:   59.9 mmHg MV Peak grad:  13.2 mmHg     TR Vmax:        387.00 cm/s MV Mean grad:  2.0 mmHg MV Vmax:       1.82 m/s      SHUNTS MV Vmean:      67.7 cm/s     Systemic VTI:  0.14 m MR Peak grad:    92.9 mmHg   Systemic Diam: 1.90 cm MR Mean grad:    56.0 mmHg MR Vmax:         482.00 cm/s MR Vmean:        349.0 cm/s MR PISA:  6.28 cm MR PISA Eff ROA: 52 mm MR PISA Radius:  1.00 cm Lyman Bishop MD Electronically signed by Lyman Bishop MD Signature Date/Time: 07/06/2020/4:20:38 PM    Final    ECHO TEE  Result Date: 07/08/2020    TRANSESOPHOGEAL ECHO REPORT   Patient Name:   Morgan Boyer Date of Exam: 07/08/2020 Medical Rec #:  [id number]       Height:       62.0 in Accession #:    [id number]      Weight:       142.0 lb Date of Birth:  [birth year]        BSA:          1.653  m Patient Age:    40 years        BP:           143/91 mmHg Patient Gender: F               HR:           92 bpm. Exam Location:  Inpatient Procedure: Transesophageal Echo, Color Doppler, Cardiac Doppler and 3D Echo Indications:     Mitral Regurgitation  History:         Patient has prior history of Echocardiogram examinations, most                  recent 07/06/2020. Arrythmias:Atrial Fibrillation; Risk                  Factors:Dyslipidemia and Hypertension.  Sonographer:     Mikki Santee RDCS (AE) Referring Phys:  [place] Diagnosing Phys: Buford Dresser MD PROCEDURE: After discussion of the risks and benefits of a TEE, an informed consent was obtained from the patient. The transesophogeal probe was passed without difficulty through the esophogus of the patient. Sedation performed by different physician. The patient was monitored while under deep sedation. Anesthestetic sedation was provided intravenously by Anesthesiology: 234.56mg  of Propofol, 60mg  of Lidocaine. Image quality was good. The patient developed no complications during the procedure. IMPRESSIONS  1. Left ventricular ejection fraction, by estimation, is 60 to 65%. The left ventricle has normal function. The left ventricle has no regional wall motion abnormalities. There is the interventricular septum is flattened in systole and diastole, consistent with right ventricular pressure and volume overload.  2. Right ventricular systolic function is normal. The right ventricular size is normal. There is severely elevated pulmonary artery systolic pressure. The estimated right ventricular systolic pressure is 37.1 mmHg.  3. Left atrial size was severely dilated. No left atrial/left atrial appendage thrombus was detected.  4. Right atrial size was severely dilated.  5. A small pericardial effusion is present.  6. Flail P1/P2 with severe anterior eccentric MR jet.. The mitral valve is degenerative. Severe mitral valve regurgitation. The  mean mitral valve gradient is 3.0 mmHg. Moderate mitral annular calcification.  7. Tricuspid valve regurgitation is moderate to severe.  8. The aortic valve is tricuspid. There is mild calcification of the aortic valve. There is mild thickening of the aortic valve. Aortic valve regurgitation is not visualized. No aortic stenosis is present.  9. There is Moderate (Grade III) plaque involving the transverse and descending aorta. Conclusion(s)/Recommendation(s): Severe eccentric MR with flail P1/P2 segments. Moderate to (likely) severe tricuspid regurgitation with severely elevated RVSP. FINDINGS  Left Ventricle: Left ventricular ejection fraction, by estimation, is 60 to 65%. The left ventricle has normal function. The left ventricle has no regional wall motion abnormalities.  The left ventricular internal cavity size was normal in size. The interventricular septum is flattened in systole and diastole, consistent with right ventricular pressure and volume overload. Right Ventricle: The right ventricular size is normal. No increase in right ventricular wall thickness. Right ventricular systolic function is normal. There is severely elevated pulmonary artery systolic pressure. The tricuspid regurgitant velocity is 3.74 m/s, and with an assumed right atrial pressure of 8 mmHg, the estimated right ventricular systolic pressure is 35.0 mmHg. Left Atrium: Left atrial size was severely dilated. No left atrial/left atrial appendage thrombus was detected. Right Atrium: Right atrial size was severely dilated. Pericardium: A small pericardial effusion is present. Mitral Valve: Flail P1/P2 with severe anterior eccentric MR jet. The mitral valve is degenerative in appearance. There is mild thickening of the mitral valve leaflet(s). There is mild calcification of the mitral valve leaflet(s). Moderate mitral annular calcification. Severe mitral valve regurgitation. MV peak gradient, 8.9 mmHg. The mean mitral valve gradient is 3.0  mmHg. Tricuspid Valve: The tricuspid valve is normal in structure. Tricuspid valve regurgitation is moderate to severe. No evidence of tricuspid stenosis. Aortic Valve: The aortic valve is tricuspid. There is mild calcification of the aortic valve. There is mild thickening of the aortic valve. There is moderate aortic valve annular calcification. Aortic valve regurgitation is not visualized. No aortic stenosis is present. Pulmonic Valve: The pulmonic valve was normal in structure. Pulmonic valve regurgitation is trivial. Aorta: The aortic root and ascending aorta are structurally normal, with no evidence of dilitation. There is moderate (Grade III) plaque involving the transverse and descending aorta. IAS/Shunts: No atrial level shunt detected by color flow Doppler.  AORTIC VALVE LVOT Vmax:   81.90 cm/s LVOT Vmean:  47.100 cm/s LVOT VTI:    0.106 m MITRAL VALVE                 TRICUSPID VALVE MV Peak grad: 8.9 mmHg       TR Peak grad:   56.0 mmHg MV Mean grad: 3.0 mmHg       TR Vmax:        374.00 cm/s MV Vmax:      1.49 m/s MV Vmean:     75.5 cm/s      SHUNTS MR Peak grad:    75.0 mmHg   Systemic VTI: 0.11 m MR Mean grad:    49.0 mmHg MR Vmax:         433.00 cm/s MR Vmean:        334.0 cm/s MR PISA:         6.28 cm MR PISA Eff ROA: 56 mm MR PISA Radius:  1.00 cm Buford Dresser MD Electronically signed by Buford Dresser MD Signature Date/Time: 07/08/2020/6:24:57 PM    Final    CUP PACEART REMOTE DEVICE CHECK  Result Date: 07/26/2020 ILR summary report received. Battery status OK. Normal device function. No new symptom, tachy, brady, or pause episodes. No new AF episodes. Monthly summary reports and ROV/PRN   Microbiology: Recent Results (from the past 240 hour(s))  SARS CORONAVIRUS 2 (TAT 6-24 HRS) Nasopharyngeal Nasopharyngeal Swab     Status: None   Collection Time: 07/15/2020  5:06 PM   Specimen: Nasopharyngeal Swab  Result Value Ref Range Status   SARS Coronavirus 2 NEGATIVE NEGATIVE  Final    Comment: (NOTE) SARS-CoV-2 target nucleic acids are NOT DETECTED.  The SARS-CoV-2 RNA is generally detectable in upper and lower respiratory specimens during the acute phase of infection. Negative results do  not preclude SARS-CoV-2 infection, do not rule out co-infections with other pathogens, and should not be used as the sole basis for treatment or other patient management decisions. Negative results must be combined with clinical observations, patient history, and epidemiological information. The expected result is Negative.  Fact Sheet for Patients: SugarRoll.be  Fact Sheet for Healthcare Providers: https://www.woods-mathews.com/  This test is not yet approved or cleared by the [place] FDA and  has been authorized for detection and/or diagnosis of SARS-CoV-2 by FDA under an Emergency Use Authorization (EUA). This EUA will remain  in effect (meaning this test can be used) for the duration of the COVID-19 declaration under Se ction 564(b)(1) of the Act, 21 U.S.C. section 360bbb-3(b)(1), unless the authorization is terminated or revoked sooner.  Performed at [facility], [place] [address], [place], [place] [address]      Labs: Basic Metabolic Panel: Recent Labs  Lab 2020/08/06 1411 2020/08/06 1448  NA 135 135  K 4.6 4.6  CL 106 107  CO2 13*  --   GLUCOSE 70 64*  BUN 42* 44*  CREATININE 3.16* 3.30*  CALCIUM 6.9*  --    Liver Function Tests: Recent Labs  Lab 06-Aug-2020 1411  AST 324*  ALT 246*  ALKPHOS 63  BILITOT 2.0*  PROT 4.6*  ALBUMIN 2.7*   No results for input(s): LIPASE, AMYLASE in the last 168 hours. No results for input(s): AMMONIA in the last 168 hours. CBC: Recent Labs  Lab Aug 06, 2020 1411 08-06-2020 1448  WBC 13.9*  --   HGB 13.3 13.6  HCT 43.0 40.0  MCV 92.5  --   PLT 198  --    Cardiac Enzymes: No results for input(s): CKTOTAL, CKMB, CKMBINDEX, TROPONINI in the last 168  hours. D-Dimer No results for input(s): DDIMER in the last 72 hours. BNP: Invalid input(s): POCBNP CBG: Recent Labs  Lab 08/06/20 1501 Aug 06, 2020 1503 2020/08/06 1519 Aug 06, 2020 1539 06-Aug-2020 1628  GLUCAP 27*  27* 41* 57* 206* 187*   Anemia work up No results for input(s): VITAMINB12, FOLATE, FERRITIN, TIBC, IRON, RETICCTPCT in the last 72 hours. Urinalysis    Component Value Date/Time   COLORURINE YELLOW 10/28/2018 1644   APPEARANCEUR HAZY (A) 10/28/2018 1644   LABSPEC 1.014 10/28/2018 1644   PHURINE 6.0 10/28/2018 1644   GLUCOSEU NEGATIVE 10/28/2018 1644   HGBUR NEGATIVE 10/28/2018 1644   BILIRUBINUR NEGATIVE 10/28/2018 1644   KETONESUR 20 (A) 10/28/2018 1644   PROTEINUR NEGATIVE 10/28/2018 1644   UROBILINOGEN 0.2 10/31/2011 1139   NITRITE NEGATIVE 10/28/2018 1644   LEUKOCYTESUR LARGE (A) 10/28/2018 1644   Sepsis Labs Invalid input(s): PROCALCITONIN,  WBC,  LACTICIDVEN     SIGNED:  Norval Morton, MD  [facility] 08/03/2020, 10:32 PM Pager   If 7PM-7AM, please contact night-coverage [email or web address] Password [email or web address]

## 2020-10-25 ENCOUNTER — Ambulatory Visit: Payer: PPO | Admitting: Adult Health

## 2020-11-01 ENCOUNTER — Ambulatory Visit: Payer: PPO | Admitting: Adult Health

## 2020-12-07 ENCOUNTER — Ambulatory Visit: Payer: PPO | Admitting: Adult Health

## 2021-06-17 IMAGING — CT CT ANGIOGRAPHY NECK
3 of 7 series · 10 of 36 positions shown · IV contrast (omnipaque)
Comparison: Head CT same day

CLINICAL DATA: Left-sided weakness

EXAM:
CT ANGIOGRAPHY HEAD AND NECK
TECHNIQUE: Multidetector CT imaging of the head and neck was performed using
the standard protocol during bolus administration of intravenous
contrast. Multiplanar CT image reconstructions and MIPs were
obtained to evaluate the vascular anatomy. Carotid stenosis
measurements (when applicable) are obtained utilizing NASCET
criteria, using the distal internal carotid diameter as the
denominator.
CONTRAST:  75mL OMNIPAQUE IOHEXOL 350 MG/ML SOLN

[Series 6: cta neck/head · axial · 0.52mm/px · z∈[-278,-162]mm · 2 of 174 slices shown]
[im 58/174  soft-tissue]
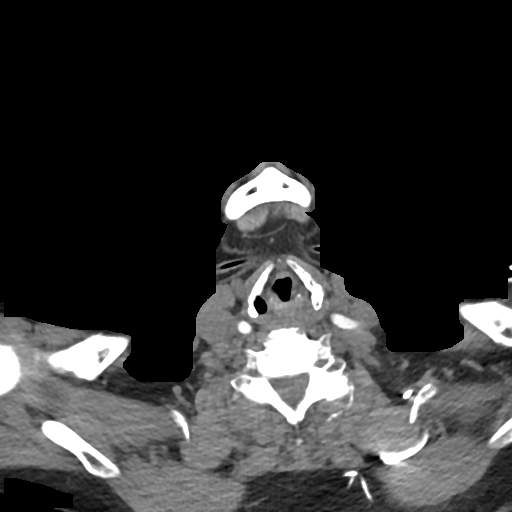
[im 116/174  soft-tissue]
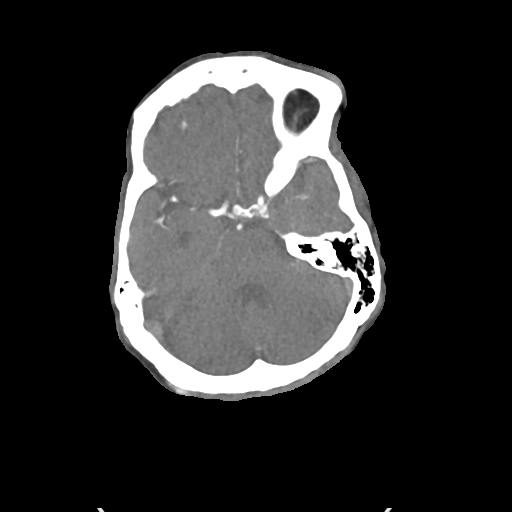

[Series 8: ax thins · axial · 0.39mm/px · z∈[-342,-97]mm · 6 of 344 slices shown]
[im 50/344  soft-tissue]
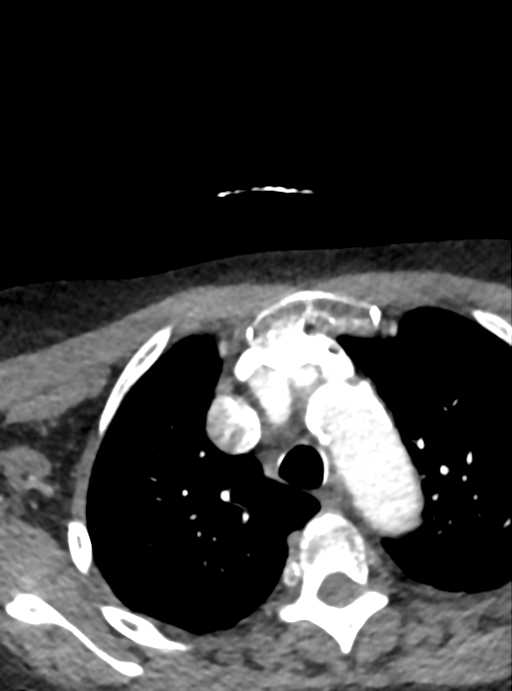
[im 99/344  bone]
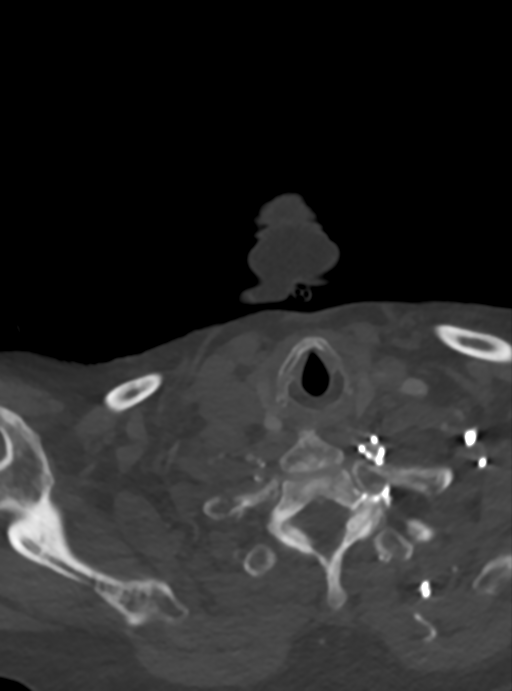
[im 148/344  soft-tissue]
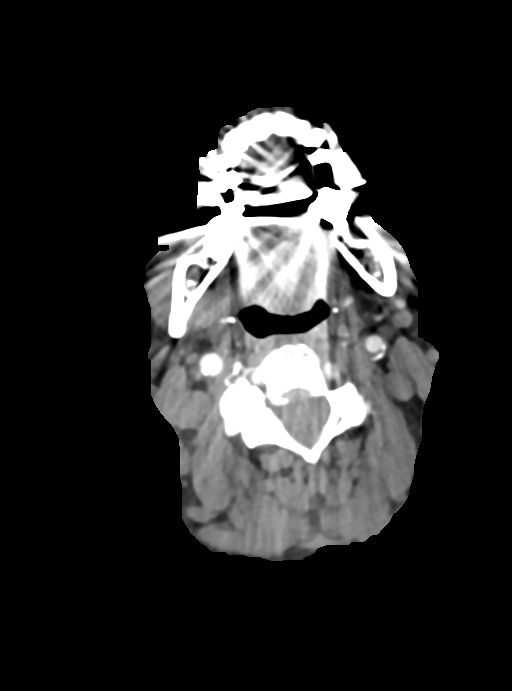
[im 197/344  bone]
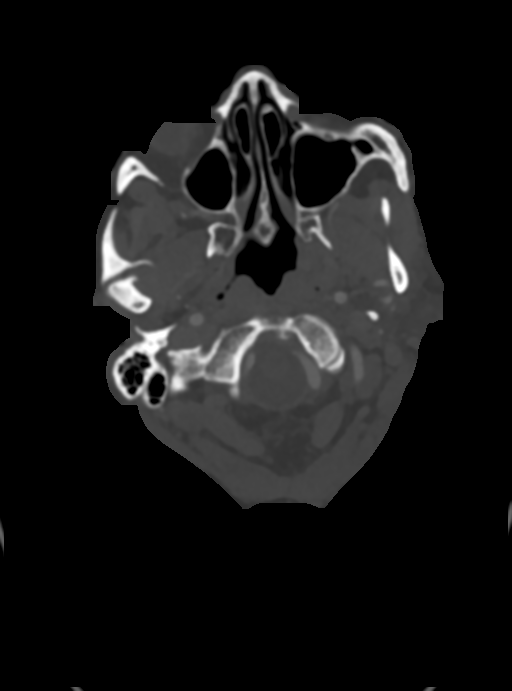
[im 246/344  soft-tissue]
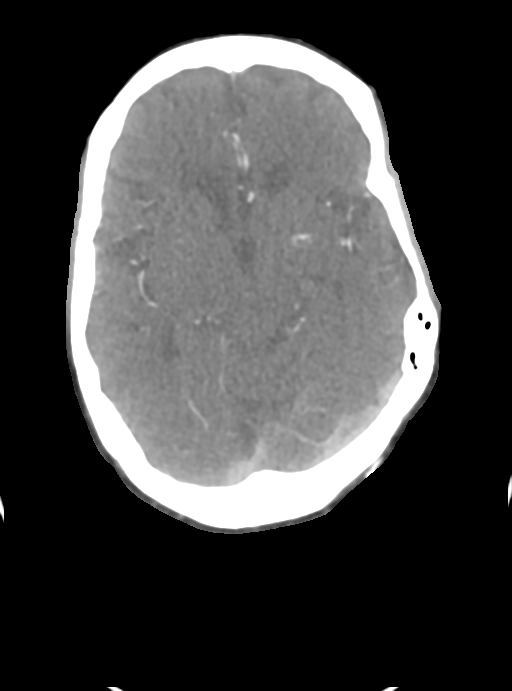
[im 295/344  bone]
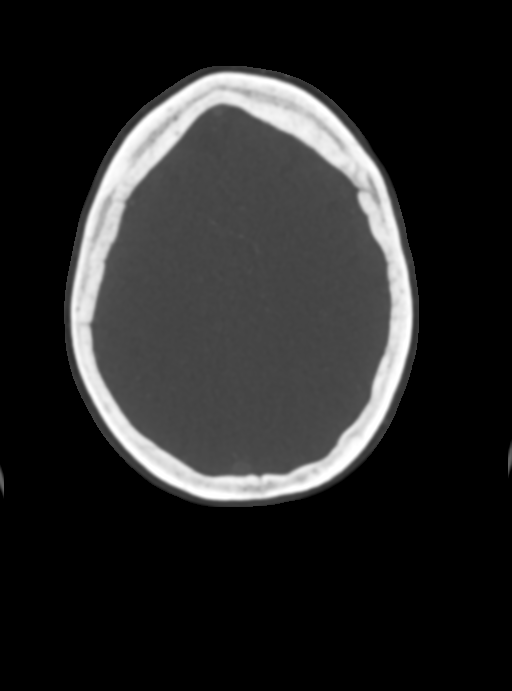

[Series 10: sag thins · sagittal · 0.50mm/px · 2 of 201 slices shown]
[im 51/201  soft-tissue]
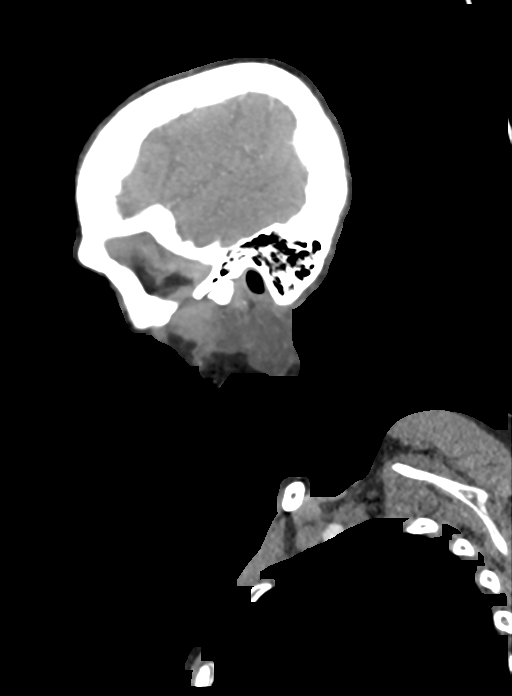
[im 151/201  soft-tissue]
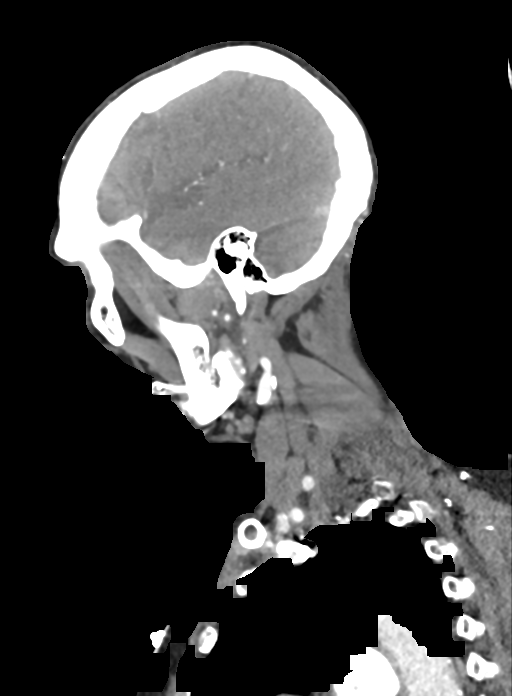

[10 of 36 positions shown; findings below may reference images not displayed]

FINDINGS: CTA NECK FINDINGS

SKELETON: There is no bony spinal canal stenosis. No lytic or
blastic lesion.

OTHER NECK: Normal pharynx, larynx and major salivary glands. No
cervical lymphadenopathy. Unremarkable thyroid gland.

UPPER CHEST: No pneumothorax or pleural effusion. No nodules or
masses.

AORTIC ARCH:

There is no calcific atherosclerosis of the aortic arch. There is no
aneurysm, dissection or hemodynamically significant stenosis of the
visualized ascending aorta and aortic arch.

Conventional 3 vessel aortic branching pattern.

The visualized proximal subclavian arteries are widely patent.

RIGHT CAROTID SYSTEM:

--Common carotid artery: Widely patent origin without common carotid
artery dissection or aneurysm.

--Internal carotid artery: No dissection, occlusion or aneurysm.
Mild atherosclerotic calcification at the carotid bifurcation
without hemodynamically significant stenosis.

--External carotid artery: No acute abnormality.

LEFT CAROTID SYSTEM:

--Common carotid artery: Widely patent origin without common carotid
artery dissection or aneurysm.

--Internal carotid artery: No dissection, occlusion or aneurysm.
Mild atherosclerotic calcification at the carotid bifurcation
without hemodynamically significant stenosis.

--External carotid artery: No acute abnormality.

VERTEBRAL ARTERIES: Left dominant configuration.

Both origins are clearly patent.

No dissection, occlusion or flow-limiting stenosis to the skull base
(V1-V3 segments).

CTA HEAD FINDINGS

POSTERIOR CIRCULATION:

--Vertebral arteries: Normal V4 segments.

--Posterior inferior cerebellar arteries (PICA): Patent origins from
the vertebral arteries.

--Anterior inferior cerebellar arteries (AICA): Patent origins from
the basilar artery.

--Basilar artery: Normal.

--Superior cerebellar arteries: Normal.

--Posterior cerebral arteries (PCA): Normal. There are bilateral
posterior communicating arteries (p-comm) that partially supply the
PCAs.

ANTERIOR CIRCULATION:

--Intracranial internal carotid arteries: Atherosclerotic
calcification of the internal carotid arteries at the skull base
without hemodynamically significant stenosis.

--Anterior cerebral arteries (ACA): Normal. Both A1 segments are
present. Patent anterior communicating artery (a-comm).

--Middle cerebral arteries (MCA): Normal.

VENOUS SINUSES: As permitted by contrast timing, patent.

ANATOMIC VARIANTS: None

Review of the MIP images confirms the above findings.
IMPRESSION: 1. No emergent large vessel occlusion or hemodynamically significant
stenosis.
2. Mild bilateral carotid bifurcation atherosclerosis with less than
50% stenosis.
3.  Aortic atherosclerosis ([id number]).
